# Patient Record
Sex: Female | Born: 1975 | Race: White | Hispanic: No | Marital: Married | Smoking: Never smoker
Health system: Southern US, Community
[De-identification: ages and names within clinical notes are randomized; demographics above are authoritative.]

## PROBLEM LIST (undated history)

## (undated) DIAGNOSIS — IMO0002 Reserved for concepts with insufficient information to code with codable children: Secondary | ICD-10-CM

## (undated) DIAGNOSIS — R000 Tachycardia, unspecified: Secondary | ICD-10-CM

## (undated) DIAGNOSIS — Z8639 Personal history of other endocrine, nutritional and metabolic disease: Secondary | ICD-10-CM

## (undated) DIAGNOSIS — F32A Depression, unspecified: Secondary | ICD-10-CM

## (undated) DIAGNOSIS — Z973 Presence of spectacles and contact lenses: Secondary | ICD-10-CM

## (undated) DIAGNOSIS — F419 Anxiety disorder, unspecified: Secondary | ICD-10-CM

## (undated) DIAGNOSIS — N803 Endometriosis of pelvic peritoneum: Secondary | ICD-10-CM

## (undated) DIAGNOSIS — M1990 Unspecified osteoarthritis, unspecified site: Secondary | ICD-10-CM

## (undated) DIAGNOSIS — T7840XA Allergy, unspecified, initial encounter: Secondary | ICD-10-CM

## (undated) DIAGNOSIS — F329 Major depressive disorder, single episode, unspecified: Secondary | ICD-10-CM

## (undated) DIAGNOSIS — C801 Malignant (primary) neoplasm, unspecified: Secondary | ICD-10-CM

## (undated) DIAGNOSIS — Z9889 Other specified postprocedural states: Secondary | ICD-10-CM

## (undated) DIAGNOSIS — E119 Type 2 diabetes mellitus without complications: Secondary | ICD-10-CM

## (undated) DIAGNOSIS — R112 Nausea with vomiting, unspecified: Secondary | ICD-10-CM

## (undated) DIAGNOSIS — N939 Abnormal uterine and vaginal bleeding, unspecified: Secondary | ICD-10-CM

## (undated) DIAGNOSIS — M797 Fibromyalgia: Secondary | ICD-10-CM

## (undated) DIAGNOSIS — R102 Pelvic and perineal pain: Secondary | ICD-10-CM

## (undated) DIAGNOSIS — G8929 Other chronic pain: Secondary | ICD-10-CM

## (undated) HISTORY — PX: ABDOMINAL HYSTERECTOMY: SHX81

## (undated) HISTORY — DX: Unspecified osteoarthritis, unspecified site: M19.90

## (undated) HISTORY — PX: REDUCTION MAMMAPLASTY: SUR839

## (undated) HISTORY — DX: Allergy, unspecified, initial encounter: T78.40XA

## (undated) HISTORY — DX: Malignant (primary) neoplasm, unspecified: C80.1

## (undated) HISTORY — PX: BREAST REDUCTION SURGERY: SHX8

## (undated) HISTORY — PX: MANDIBLE SURGERY: SHX707

## (undated) HISTORY — DX: Anxiety disorder, unspecified: F41.9

## (undated) HISTORY — PX: APPENDECTOMY: SHX54

## (undated) SURGERY — DILATION AND CURETTAGE
Anesthesia: Choice

---

## 2008-06-12 ENCOUNTER — Other Ambulatory Visit: Admission: RE | Admit: 2008-06-12 | Discharge: 2008-06-12 | Payer: Self-pay | Admitting: Obstetrics and Gynecology

## 2008-11-25 ENCOUNTER — Inpatient Hospital Stay (HOSPITAL_COMMUNITY): Admission: AD | Admit: 2008-11-25 | Discharge: 2008-11-25 | Payer: Self-pay | Admitting: Obstetrics and Gynecology

## 2008-11-26 ENCOUNTER — Ambulatory Visit (HOSPITAL_COMMUNITY): Admission: RE | Admit: 2008-11-26 | Discharge: 2008-11-26 | Payer: Self-pay | Admitting: Obstetrics and Gynecology

## 2008-12-03 ENCOUNTER — Ambulatory Visit (HOSPITAL_COMMUNITY): Admission: RE | Admit: 2008-12-03 | Discharge: 2008-12-03 | Payer: Self-pay | Admitting: Obstetrics and Gynecology

## 2010-04-14 LAB — CBC
HCT: 37.6 % (ref 36.0–46.0)
HCT: 39.4 % (ref 36.0–46.0)
Hemoglobin: 12.6 g/dL (ref 12.0–15.0)
Hemoglobin: 13.2 g/dL (ref 12.0–15.0)
MCHC: 33.4 g/dL (ref 30.0–36.0)
MCHC: 33.6 g/dL (ref 30.0–36.0)
MCV: 77.7 fL — ABNORMAL LOW (ref 78.0–100.0)
MCV: 78.4 fL (ref 78.0–100.0)
Platelets: 220 K/uL (ref 150–400)
Platelets: 224 K/uL (ref 150–400)
RBC: 4.84 MIL/uL (ref 3.87–5.11)
RBC: 5.03 MIL/uL (ref 3.87–5.11)
RDW: 14.7 % (ref 11.5–15.5)
RDW: 14.8 % (ref 11.5–15.5)
WBC: 10.8 K/uL — ABNORMAL HIGH (ref 4.0–10.5)
WBC: 9.4 K/uL (ref 4.0–10.5)

## 2010-04-14 LAB — HCG, QUANTITATIVE, PREGNANCY

## 2010-04-14 LAB — GC/CHLAMYDIA PROBE AMP, GENITAL
Chlamydia, DNA Probe: NEGATIVE
GC Probe Amp, Genital: NEGATIVE

## 2010-04-14 LAB — ABO/RH: ABO/RH(D): A POS

## 2013-06-17 DIAGNOSIS — Z5181 Encounter for therapeutic drug level monitoring: Secondary | ICD-10-CM | POA: Insufficient documentation

## 2013-06-17 DIAGNOSIS — G894 Chronic pain syndrome: Secondary | ICD-10-CM | POA: Insufficient documentation

## 2013-06-17 DIAGNOSIS — M729 Fibroblastic disorder, unspecified: Secondary | ICD-10-CM | POA: Insufficient documentation

## 2013-07-17 DIAGNOSIS — R102 Pelvic and perineal pain: Secondary | ICD-10-CM | POA: Insufficient documentation

## 2013-10-03 ENCOUNTER — Encounter: Payer: Self-pay | Admitting: Emergency Medicine

## 2013-10-03 ENCOUNTER — Emergency Department (INDEPENDENT_AMBULATORY_CARE_PROVIDER_SITE_OTHER): Payer: BC Managed Care – PPO

## 2013-10-03 ENCOUNTER — Emergency Department
Admission: EM | Admit: 2013-10-03 | Discharge: 2013-10-03 | Disposition: A | Discharge status: Home / Self Care | Payer: BC Managed Care – PPO | Source: Home / Self Care | Attending: Emergency Medicine | Admitting: Emergency Medicine

## 2013-10-03 DIAGNOSIS — D169 Benign neoplasm of bone and articular cartilage, unspecified: Secondary | ICD-10-CM

## 2013-10-03 DIAGNOSIS — S8001XA Contusion of right knee, initial encounter: Secondary | ICD-10-CM

## 2013-10-03 MED ORDER — HYDROCODONE-ACETAMINOPHEN 5-325 MG PO TABS: 1.0000 | ORAL_TABLET | ORAL | Status: DC | PRN

## 2013-10-03 NOTE — Discharge Instructions (Signed)
Contusion °A contusion is a deep bruise. Contusions are the result of an injury that caused bleeding under the skin. The contusion may turn blue, purple, or yellow. Minor injuries will give you a painless contusion, but more severe contusions may stay painful and swollen for a few weeks.  °CAUSES  °A contusion is usually caused by a blow, trauma, or direct force to an area of the body. °SYMPTOMS  °· Swelling and redness of the injured area. °· Bruising of the injured area. °· Tenderness and soreness of the injured area. °· Pain. °DIAGNOSIS  °The diagnosis can be made by taking a history and physical exam. An X-ray, CT scan, or MRI may be needed to determine if there were any associated injuries, such as fractures. °TREATMENT  °Specific treatment will depend on what area of the body was injured. In general, the best treatment for a contusion is resting, icing, elevating, and applying cold compresses to the injured area. Over-the-counter medicines may also be recommended for pain control. Ask your caregiver what the best treatment is for your contusion. °HOME CARE INSTRUCTIONS  °· Put ice on the injured area. °¨ Put ice in a plastic bag. °¨ Place a towel between your skin and the bag. °¨ Leave the ice on for 15-20 minutes, 3-4 times a day, or as directed by your health care provider. °· Only take over-the-counter or prescription medicines for pain, discomfort, or fever as directed by your caregiver. Your caregiver may recommend avoiding anti-inflammatory medicines (aspirin, ibuprofen, and naproxen) for 48 hours because these medicines may increase bruising. °· Rest the injured area. °· If possible, elevate the injured area to reduce swelling. °SEEK IMMEDIATE MEDICAL CARE IF:  °· You have increased bruising or swelling. °· You have pain that is getting worse. °· Your swelling or pain is not relieved with medicines. °MAKE SURE YOU:  °· Understand these instructions. °· Will watch your condition. °· Will get help right  away if you are not doing well or get worse. °Document Released: 10/06/2004 Document Revised: 01/01/2013 Document Reviewed: 11/01/2010 °[employer]® Patient Information ©2015 [employer]. This information is not intended to replace advice given to you by your health care provider. Make sure you discuss any questions you have with your health care provider. ° °

## 2013-10-03 NOTE — ED Provider Notes (Signed)
CSN: [id number]     Arrival date & time 10/03/13  1640 History   First MD Initiated Contact with Patient 10/03/13 1702     Chief Complaint  Patient presents with  . Fall   (Consider location/radiation/quality/duration/timing/severity/associated sxs/prior Treatment) Patient is a 38 y.o. female presenting with fall. The history is provided by the patient. No language interpreter was used.  Fall This is a new problem. The problem occurs constantly. The problem has been gradually worsening. Nothing aggravates the symptoms. The symptoms are relieved by walking. She has tried nothing for the symptoms. The treatment provided no relief.    Past Medical History  Diagnosis Date  . Diabetes mellitus without complication    History reviewed. No pertinent past surgical history. Family History  Problem Relation Age of Onset  . Adopted: Yes  . Hyperlipidemia Mother   . Hypertension Mother    History  Substance Use Topics  . Smoking status: Never Smoker   . Smokeless tobacco: Not on file  . Alcohol Use: No   OB History   Grav Para Term Preterm Abortions TAB SAB Ect Mult Living                 Review of Systems  All other systems reviewed and are negative.   Allergies  Review of patient's allergies indicates no known allergies.  Home Medications   Prior to Admission medications   Medication Sig Start Date End Date Taking? Authorizing Provider  gabapentin (NEURONTIN) 300 MG capsule Take 300 mg by mouth 3 (three) times daily.   Yes Historical Provider, MD  ibuprofen (ADVIL,MOTRIN) 200 MG tablet Take 200 mg by mouth every 6 (six) hours as needed.   Yes Historical Provider, MD  metFORMIN (GLUCOPHAGE) 500 MG tablet Take by mouth 2 (two) times daily with a meal.   Yes Historical Provider, MD   BP 162/90  Pulse 110  Temp(Src) 98.1 F (36.7 C) (Oral)  Ht 5\' 6"  (1.676 m)  Wt 295 lb (133.811 kg)  BMI 47.64 kg/m2  SpO2 100%  LMP 10/02/2013 Physical Exam  Nursing note and vitals  reviewed. Constitutional: She is oriented to person, place, and time.  Musculoskeletal: She exhibits tenderness.  Swollen right knee,  Bruised,  No effusion,  nv and ns intact,  No gross instability  Neurological: She is alert and oriented to person, place, and time. She has normal reflexes.  Skin: Skin is warm.  Psychiatric: She has a normal mood and affect.    ED Course  Procedures (including critical care time) Labs Review Labs Reviewed - No data to display  Imaging Review No results found.   MDM   1. Contusion of right knee, initial encounter    Knee imbolizer Hydrocodone Follow up with Dr. Darene Lamer in one week. An After Visit Summary was printed and given to the patient.    Fransico Meadow, PA-C 10/03/13 1750  Medical history/examination/treatment/procedure(s) were performed by non-physician provider and as supervising physician I was immediately available for consultation/collaboration.   Jacqulyn Cane, MD 10/04/13 1116

## 2013-10-03 NOTE — ED Notes (Signed)
Rt knee injury from fall, tripped and all weight landed on right knee on wooden ground

## 2013-10-07 ENCOUNTER — Ambulatory Visit: Payer: BC Managed Care – PPO | Attending: Gynecologic Oncology | Admitting: Gynecologic Oncology

## 2013-10-07 ENCOUNTER — Encounter: Payer: Self-pay | Admitting: Gynecologic Oncology

## 2013-10-07 VITALS — BP 147/92 | HR 117 | Temp 98.5°F | Resp 22 | Ht 66.0 in | Wt 299.1 lb

## 2013-10-07 DIAGNOSIS — F411 Generalized anxiety disorder: Secondary | ICD-10-CM

## 2013-10-07 DIAGNOSIS — E119 Type 2 diabetes mellitus without complications: Secondary | ICD-10-CM | POA: Diagnosis not present

## 2013-10-07 DIAGNOSIS — N920 Excessive and frequent menstruation with regular cycle: Secondary | ICD-10-CM | POA: Diagnosis not present

## 2013-10-07 DIAGNOSIS — F419 Anxiety disorder, unspecified: Secondary | ICD-10-CM

## 2013-10-07 DIAGNOSIS — N803 Endometriosis of pelvic peritoneum, unspecified: Secondary | ICD-10-CM

## 2013-10-07 DIAGNOSIS — R102 Pelvic and perineal pain: Secondary | ICD-10-CM

## 2013-10-07 DIAGNOSIS — M25559 Pain in unspecified hip: Secondary | ICD-10-CM

## 2013-10-07 DIAGNOSIS — E1169 Type 2 diabetes mellitus with other specified complication: Secondary | ICD-10-CM

## 2013-10-07 DIAGNOSIS — Z79899 Other long term (current) drug therapy: Secondary | ICD-10-CM | POA: Insufficient documentation

## 2013-10-07 DIAGNOSIS — M797 Fibromyalgia: Secondary | ICD-10-CM

## 2013-10-07 DIAGNOSIS — IMO0001 Reserved for inherently not codable concepts without codable children: Secondary | ICD-10-CM

## 2013-10-07 DIAGNOSIS — E669 Obesity, unspecified: Secondary | ICD-10-CM

## 2013-10-07 DIAGNOSIS — N949 Unspecified condition associated with female genital organs and menstrual cycle: Secondary | ICD-10-CM | POA: Insufficient documentation

## 2013-10-07 DIAGNOSIS — IMO0002 Reserved for concepts with insufficient information to code with codable children: Secondary | ICD-10-CM | POA: Insufficient documentation

## 2013-10-07 DIAGNOSIS — G8929 Other chronic pain: Secondary | ICD-10-CM | POA: Insufficient documentation

## 2013-10-07 HISTORY — DX: Fibromyalgia: M79.7

## 2013-10-07 HISTORY — DX: Obesity, unspecified: E11.69

## 2013-10-07 HISTORY — DX: Generalized anxiety disorder: F41.1

## 2013-10-07 HISTORY — DX: Type 2 diabetes mellitus with other specified complication: E66.9

## 2013-10-07 MED ORDER — NORGESTIMATE-ETH ESTRADIOL 0.25-35 MG-MCG PO TABS: 1.0000 | ORAL_TABLET | Freq: Every day | ORAL | Status: DC

## 2013-10-07 MED ORDER — OXYCODONE HCL 10 MG PO TABS: 10.0000 mg | ORAL_TABLET | ORAL | Status: DC | PRN

## 2013-10-07 MED ORDER — OXYCODONE HCL ER 10 MG PO T12A: 10.0000 mg | EXTENDED_RELEASE_TABLET | Freq: Two times a day (BID) | ORAL | Status: DC

## 2013-10-07 NOTE — Progress Notes (Signed)
Consult Note: Gyn-Onc  Consult was requested by Dr. Nelda Marseille for the evaluation of Christine Grant 38 y.o. female with chronic pelvic pain and dysfunctional uterine bleeding  CC:  Chief Complaint  Patient presents with  . Heavy periods, Pelvic Pain    Assessment/Plan:  Ms. Christine Grant  is a 38 y.o.  year old who is seen in consultation at the request of Dr Nelda Marseille for pelvic pain after cesarean sections and abnormal uterine bleeding.  1/ Pelvic Pain: I am suspicious that Christine Grant's pain is caused by endometriosis rather than strictly postoperative adhesions given its cyclical nature and its onset 7 years after her last cesarean section. I discussed that postoperative (particularly post-cesarean) adhesive disease rarely causes pain unless it is causing incarceration of a viscera (eg bowel) in which case the pain is associated with symptoms from the incarcerated viscera. It is also extremely rare for pathology from adhesions (other than SBO) to spontaneously develop this remote from surgery. This is clearly not the case for Christine Grant whose pain is cyclical, premenstrual and menstrual (dysmenorrhea) in nature.   I discussed with Christine Grant the etiology of surgical adhesions (that they are formed from interruption of tissue planes and therefore spontaneously reform immediately after subsequent surgeries). I discussed that they are not an anatomic structure in their own right that can be resected, but rather involve careful separation of attached visceral structures, which, once again, reform after completion of the surgery due to release of inflammatory mediators.   I discussed that a more likely contributor to pathologic adhesions in Christine Grant case was endometriosis (typically between the ovaries and GI and GU viscera) rather than cesarean adhesions between the lower uterine segment and anterior abdominal wall, which are typically painless.  If endometriosis is the underlying source of her pain, it would  be prudent and necessary to first attempt medical management of this pain with hormonal modulation and suppression of menses. I offered her continuous OCP's vs progestin releasing IUD vs Lupron suppression trial. I discussed that if these therapies are successful in controlling her symptoms, she could either continue with medical management, or attempt surgery knowing that there would be a higher liklihood of success in surgery resolving her symptoms. If the medical ovarian suppression fails to improve symptoms, then surgery (other than potentially a diagnostic laparoscopy) would not be indicated, as the potential for success would be extremely limited and would not outweigh the substantial risks. She is electing for continous OCP trial first (she does not want another IUD as the last (in 2007) had to be "surgically removed from her myometrium" and she would prefer to avoid injections. I have prescribed Sprintec to take continuously. I would like her to trial this for 3-4 months and we will re-evaluate her control of pelvic pain before embarking upon surgery.  I discussed with Christine Grant that surgery for pelvic pain in the absence of defined pathology on imagine, is elective. I discussed that if endometriosis is suspected as the etiology for her pain (which it is), then oophorectomy (bilateral) is a more important component of the surgery than either lysis of adhesions or hysterectomy, as without oophorectomy, she would continue to have endometriosis and stimulation of this disease, and it would be unlikely that her symptoms would resolve. I discussed that surgery would result in permanent infertility (which she states she is comfortable with). I explained that surgical removal of the ovaries in a premenopausal woman would necessitate add-back therapy with HRT (likely single agent estrogen if she is s/p hysterectomy) in  order to reduce the effects of osteoporosis and earlier development of coronary vascular  disease. I discussed that this (HRT until age of natural menopause) is safe from a cancer standpoint because these doses are lower than what her ovaries make. I discussed risks of HRT and OCP's being of risks of MI, stroke and VTE.   With respect to short term management of her pain, I have prescribed her Oxycodone 10gm q 4-6 hrs prn as she reports this is the only medication that helps her symptoms. I discussed that I will assume prescription of this medication during her medical trial of therapy, and that she should not have this medication prescribed by alternate providers during that time. I discussed that if we move forward with surgery, I will continue to prescribe analgesia only during the immediate postoperative (6 week period) after which time I will no longer prescribe narcotics, and if they are required (other than for a surgical complication), she will need to establish care with a pain clinic or provider who will assume long term management of narcotics. We discussed the addictive and habituating nature of narcotics (particularly short acting narcotics such as oxycodone) and the side effects including altered mental status, constipation, nausea.  2/ Dysfunctional uterine bleeding She is at risk for endometrial hyperplasia and cancer given her underlying morbid obesity, and diabetes. She has regular periods and so it is unlikely that she is annovulatory. Additionally, her endometrial stripe on [place] is only 53mm, which means it is extremely unlikely that her bleeding is due to an underlying hyperplastic or malignant process. Sampling (in office) is not feasible for her given her body habitus and poor cervical access. I am recommending hysterectomy at the time of a surgical procedure given her underlying symptoms.   I discussed that she stands a very high risk for surgery due to her morbid obesity (BMI 48kg/m2) placing her at increased risk for all of the following:  bleeding, infection, damage to  internal organs (such as bladder,ureters, bowels), blood clot, reoperation and rehospitalization. I discussed that if she requires a laparotomy, she has a 30-50% risk for major wound infection or breakdown. I discussed that her DM additionally places her at risk for these complications, and that, once again, given the elective nature of the procedure, she must acknowledge that any consent to surgery includes her acknowledging that she is taking on these increased risks for an elective indication. I discussed that it is most reasonable for her to attempt to reduce her weight and control her blood sugars to reduce the perioperative morbidity risk.  I discussed that surgery, if determined to be an option based on a favorable response to medical suppression, would involve a robotic assisted total hysterectomy, bilateral salpingo-oophorectomy and lysis of adhesions with possible laparotomy. I discussed that if it is not safe to proceed with a minimally invasive approach, then laparotomy would be necessary. I discussed that in cases of endometriosis, it may be necessary to perform GI or GU procedures, and there is a small but possible risk for her requiring a colostomy formation (likely temporary).   She expressed understanding of these risks and plan. Her husband attended the appointment with [place] and witnessed this discussion.   I will see Christine Grant back in the new year after she has completed a trial of continuous OCP's. I spent 80 minutes with Christine Grant in this consultation with >50% spent in direct face to face counseling.  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 She strongly desires definitive surgical management with hysterectomy.   Christine Grant's pain began in the summer of 2015 (approximately 3 months ago). Of note she has a history of anxiety disorder and fibromyalgia.  She reports cyclic, q 28 day periods with severe central low pelvic "like I'm having a baby" pain in the week preceding her menstrual period and during her menstrual period. In the weeks following her period the pain episodes subside in intensity and frequency.  During her severe monthly bouts, she experiences cramping like pain, 10/10, only relieved (somewhat, incompletely) by oxycodone 10mg . She gets no relieve from NSAIDs. She denies dyspareunia. The pain is associated with heavier than usual periods (which have been "heavier" for the past 12 months) and include passage of clots. They last less than 7 days. She denies associated symptoms of anemia.  She also has severe back aches during these episodes. The pain during the exacerbations is continuous. During the intervening weeks it is more sporadic (once or twice a day) and less severe. She continues to take narcotics during these in-between weeks as well as during the severe bouts. She denies diarrhea, rectal bleeding, painful defecation or difficulty voiding urine (or incontinence).  She has been seen several times in Emergency Rooms for her pain bouts. She has undergone a transvaginal US (most recently 09/22/13) which revealed a normal sized (5.6x2.3x3.1cm) uterus with a 44mm endometrial stripe and several hypoechoic foci within the fundus. The right ovary was normal in dimensions without masses, the left ovary was not seen.   She has not trialed any medical/hormonal therapy (only analgesia) for her pain.   She has no history of abnormal pap smears and reports annual gyn care throughout her reproductive life. She used a Mirena IUD for contraception between her 2nd and 3rd child (between 2005-2008) however this needed to be hysteroscopically resected as it became "embedded within her uterus".   She has a history of 3 cesarean sections. The first was an elective primary cesarean section due to anticipated large size fetus (9 lbs) in a diabetic mother.  The subsequent 2 cesarean sections were elective scheduled repeat cesarean sections. During the third cesarean section she was told she had severe adhesive disease which prevented a low transverse incision on the uterus (she required a vertical uterine incision) and her obstetrician could not tie both tubes (only one) due to limited visibility.  She is treated for anxiety with a benzodiazepine (alprazolam) and has never been treated with an SSRI. She is managed for fibromyalgia by her PCP, Dr Shirline Frees, with Gabapentin.    Interval History: She continues to have daily pain as stated above. She is currently experiencing a menstrual period.  Current Meds:  Outpatient Encounter Prescriptions as of 10/07/2013  Medication Sig  . gabapentin (NEURONTIN) 300 MG capsule Take 300 mg by mouth 3 (three) times daily.  Marland Kitchen ibuprofen (ADVIL,MOTRIN) 200 MG tablet Take 200 mg by mouth every 6 (six) hours as needed.  . metFORMIN (GLUCOPHAGE) 500 MG tablet Take by mouth 2 (two) times daily with a meal.  . norgestimate-ethinyl estradiol (SPRINTEC 28) 0.25-35 MG-MCG tablet Take 1 tablet by mouth daily.  . Oxycodone HCl 10 MG TABS Take 1 tablet (10 mg total) by mouth every 4 (four) hours as needed (moderate to severe pain).  . [DISCONTINUED] HYDROcodone-acetaminophen (NORCO/VICODIN) 5-325 MG per tablet Take 1-2 tablets by mouth every 4 (four) hours as  needed for moderate pain or severe pain.  . [DISCONTINUED] OxyCODONE (OXYCONTIN) 10 mg T12A 12 hr tablet Take 1 tablet (10 mg total) by mouth every 12 (twelve) hours.    Allergy: No Known Allergies  Social Hx:   History   Social History  . Marital Status: Married    Spouse Name: N/A    Number of Children: N/A  . Years of Education: N/A   Occupational History  . Not on file.   Social History Main Topics  . Smoking status: Never Smoker   . Smokeless tobacco: Not on file  . Alcohol Use: No  . Drug Use: Not on file  . Sexual Activity: Yes   Other  Topics Concern  . Not on file   Social History Narrative  . No narrative on file    Past Surgical Hx:  Past Surgical History  Procedure Laterality Date  . Cesarean section N/A 2002, 2005, 2008    Past Medical Hx:  Past Medical History  Diagnosis Date  . Diabetes mellitus without complication   . Anxiety   . Fibromyalgia affecting shoulder region     Past Gynecological History:  No abnormal pap smears. Cesarean section x3.  Patient's last menstrual period was 10/02/2013.  Family Hx:  Family History  Problem Relation Age of Onset  . Adopted: Yes  . Hyperlipidemia Mother   . Hypertension Mother     Review of Systems:  Constitutional  Feels well,    ENT Normal appearing ears and nares bilaterally Skin/Breast  No rash, sores, jaundice, itching, dryness Cardiovascular  No chest pain, shortness of breath, or edema  Pulmonary  No cough or wheeze.  Gastro Intestinal  No nausea, vomitting, or diarrhoea. No bright red blood per rectum, no abdominal pain, change in bowel movement, or constipation. + pelvic pain (see HPI) Genito Urinary  No frequency, urgency, dysuria, see HPI Musculo Skeletal  No myalgia, arthralgia, joint swelling or pain  Neurologic  No weakness, numbness, change in gait,  Psychology  No depression, anxiety, insomnia.   Vitals:  Blood pressure 147/92, pulse 117, temperature 98.5 F (36.9 C), temperature source Oral, resp. rate 22, height 5\' 6"  (1.676 m), weight 299 lb 1.6 oz (135.671 kg), last menstrual period 10/02/2013.  Physical Exam: WD in NAD Neck  Supple NROM, without any enlargements.  Lymph Node Survey No cervical supraclavicular or inguinal adenopathy Cardiovascular  Pulse normal rate, regularity and rhythm. S1 and S2 normal.  Lungs  Clear to auscultation bilateraly, without wheezes/crackles/rhonchi. Good air movement.  Skin  No rash/lesions/breakdown  Psychiatry  Alert and oriented to person, place, and time  Abdomen   Normoactive bowel sounds, abdomen soft, non-tender and morbidly obese without evidence of hernia.  Back No CVA tenderness Genito Urinary  Vulva/vagina: Normal external female genitalia.  No lesions. No discharge or bleeding.  Bladder/urethra:  No lesions or masses, well supported bladder  Vagina: normal in appearance  Cervix: Normal appearing, no lesions.  Uterus:  Small, mobile, no parametrial involvement or nodularity.  Adnexa: no palpable masses. Rectal  Good tone, no masses no cul de sac nodularity.  Extremities  No bilateral cyanosis, clubbing or edema.   Donaciano Eva, MD   10/07/2013, 4:23 PM

## 2013-10-07 NOTE — Patient Instructions (Signed)
Plan to follow up in Jan 2016 or sooner if needed.  Please call in Nov or Dec 2015 to schedule your appt.  Please call for any questions or concerns.   Ethinyl Estradiol; Norgestimate tablets What is this medicine? ETHINYL ESTRADIOL; NORGESTIMATE (ETH in il es tra DYE ole; nor JES ti mate) is an oral contraceptive. The products combine two types of female hormones, an estrogen and a progestin. They are used to prevent ovulation and pregnancy. Some products are also used to treat acne in females. This medicine may be used for other purposes; ask your health care provider or pharmacist if you have questions. COMMON BRAND NAME(S): Estarylla, MONO-LINYAH, MonoNessa, Ortho Tri-Cyclen, Ortho Tri-Cyclen Lo, Ortho-Cyclen, Previfem, Sprintec, Tri-Estarylla, TRI-LINYAH, Tri-Lo-Sprintec, Tri-Previfem, Tri-Sprintec, Bertram Millard What should I tell my health care provider before I take this medicine? They need to know if you have or ever had any of these conditions: -abnormal vaginal bleeding -blood vessel disease or blood clots -breast, cervical, endometrial, ovarian, liver, or uterine cancer -diabetes -gallbladder disease -heart disease or recent heart attack -high blood pressure -high cholesterol -kidney disease -liver disease -migraine headaches -stroke -systemic lupus erythematosus (SLE) -tobacco smoker -an unusual or allergic reaction to estrogens, progestins, other medicines, foods, dyes, or preservatives -pregnant or trying to get pregnant -breast-feeding How should I use this medicine? Take this medicine by mouth. To reduce nausea, this medicine may be taken with food. Follow the directions on the prescription label. Take this medicine at the same time each day and in the order directed on the package. Do not take your medicine more often than directed. Contact your pediatrician regarding the use of this medicine in children. Special care may be needed. This medicine has been used in female  children who have started having menstrual periods. A patient package insert for the product will be given with each prescription and refill. Read this sheet carefully each time. The sheet may change frequently. Overdosage: If you think you have taken too much of this medicine contact a poison control center or emergency room at once. NOTE: This medicine is only for you. Do not share this medicine with others. What if I miss a dose? If you miss a dose, refer to the patient information sheet you received with your medicine for direction. If you miss more than one pill, this medicine may not be as effective and you may need to use another form of birth control. What may interact with this medicine? -acetaminophen -antibiotics or medicines for infections, especially rifampin, rifabutin, rifapentine, and griseofulvin, and possibly penicillins or tetracyclines -aprepitant -ascorbic acid (vitamin C) -atorvastatin -barbiturate medicines, such as phenobarbital -bosentan -carbamazepine -caffeine -clofibrate -cyclosporine -dantrolene -doxercalciferol -felbamate -grapefruit juice -hydrocortisone -medicines for anxiety or sleeping problems, such as diazepam or temazepam -medicines for diabetes, including pioglitazone -mineral oil -modafinil -mycophenolate -nefazodone -oxcarbazepine -phenytoin -prednisolone -ritonavir or other medicines for HIV infection or AIDS -rosuvastatin -selegiline -soy isoflavones supplements -St. John's wort -tamoxifen or raloxifene -theophylline -thyroid hormones -topiramate -warfarin This list may not describe all possible interactions. Give your health care provider a list of all the medicines, herbs, non-prescription drugs, or dietary supplements you use. Also tell them if you smoke, drink alcohol, or use illegal drugs. Some items may interact with your medicine. What should I watch for while using this medicine? Visit your doctor or health care  professional for regular checks on your progress. You will need a regular breast and pelvic exam and Pap smear while on this medicine. You should also discuss  the need for regular mammograms with your health care professional, and follow his or her guidelines for these tests. This medicine can make your body retain fluid, making your fingers, hands, or ankles swell. Your blood pressure can go up. Contact your doctor or health care professional if you feel you are retaining fluid. Use an additional method of contraception during the first cycle that you take these tablets. If you have any reason to think you are pregnant, stop taking this medicine right away and contact your doctor or health care professional. If you are taking this medicine for hormone related problems, it may take several cycles of use to see improvement in your condition. Smoking increases the risk of getting a blood clot or having a stroke while you are taking birth control pills, especially if you are more than 37 years old. You are strongly advised not to smoke. This medicine can make you more sensitive to the sun. Keep out of the sun. If you cannot avoid being in the sun, wear protective clothing and use sunscreen. Do not use sun lamps or tanning beds/booths. If you wear contact lenses and notice visual changes, or if the lenses begin to feel uncomfortable, consult your eye care specialist. In some women, tenderness, swelling, or minor bleeding of the gums may occur. Notify your dentist if this happens. Brushing and flossing your teeth regularly may help limit this. See your dentist regularly and inform your dentist of the medicines you are taking. If you are going to have elective surgery, you may need to stop taking this medicine before the surgery. Consult your health care professional for advice. This medicine does not protect you against HIV infection (AIDS) or any other sexually transmitted diseases. What side effects may I  notice from receiving this medicine? Side effects that you should report to your doctor or health care professional as soon as possible: -breast tissue changes or discharge -changes in vaginal bleeding during your period or between your periods -chest pain -coughing up blood -dizziness or fainting spells -headaches or migraines -leg, arm or groin pain -severe or sudden headaches -stomach pain (severe) -sudden shortness of breath -sudden loss of coordination, especially on one side of the body -speech problems -symptoms of vaginal infection like itching, irritation or unusual discharge -tenderness in the upper abdomen -vomiting -weakness or numbness in the arms or legs, especially on one side of the body -yellowing of the eyes or skin Side effects that usually do not require medical attention (report to your doctor or health care professional if they continue or are bothersome): -breakthrough bleeding and spotting that continues beyond the 3 initial cycles of pills -breast tenderness -mood changes, anxiety, depression, frustration, anger, or emotional outbursts -increased sensitivity to sun or ultraviolet light -nausea -skin rash, acne, or brown spots on the skin -weight gain (slight) This list may not describe all possible side effects. Call your doctor for medical advice about side effects. You may report side effects to FDA at 1-800-FDA-1088. Where should I keep my medicine? Keep out of the reach of children. Store at room temperature between 15 and 30 degrees C (59 and 86 degrees F). Throw away any unused medicine after the expiration date. NOTE: This sheet is a summary. It may not cover all possible information. If you have questions about this medicine, talk to your doctor, pharmacist, or health care provider.  2015, [employer] Standard. (2007-12-13 13:40:47)

## 2013-10-10 ENCOUNTER — Ambulatory Visit (INDEPENDENT_AMBULATORY_CARE_PROVIDER_SITE_OTHER): Payer: BC Managed Care – PPO | Admitting: Sports Medicine

## 2013-10-10 ENCOUNTER — Encounter: Payer: Self-pay | Admitting: Sports Medicine

## 2013-10-10 VITALS — BP 161/95 | HR 126 | Ht 66.0 in | Wt 294.0 lb

## 2013-10-10 DIAGNOSIS — S8001XA Contusion of right knee, initial encounter: Secondary | ICD-10-CM | POA: Diagnosis not present

## 2013-10-10 NOTE — Progress Notes (Signed)
   Subjective:    I'm seeing this patient as a consultation for:  Dr. Jacqulyn Cane  CC: Right knee pain  HPI: 4 days ago this pleasant 38 year old female fell directly onto her right knee, she had immediate pain, swelling, bruising. She was seen in urgent care were x-ray showed arthritis but no fractures. She was referred to me for further evaluation and definitive treatment. She is ambulatory but does have some pain over the patellar tendon. Moderate, improving.  Past medical history, Surgical history, Family history not pertinant except as noted below, Social history, Allergies, and medications have been entered into the medical record, reviewed, and no changes needed.   Review of Systems: No headache, visual changes, nausea, vomiting, diarrhea, constipation, dizziness, abdominal pain, skin rash, fevers, chills, night sweats, weight loss, swollen lymph nodes, body aches, joint swelling, muscle aches, chest pain, shortness of breath, mood changes, visual or auditory hallucinations.   Objective:   General: Well Developed, well nourished, and in no acute distress.  Neuro/Psych: Alert and oriented x3, extra-ocular muscles intact, able to move all 4 extremities, sensation grossly intact. Skin: Warm and dry, no rashes noted.  Respiratory: Not using accessory muscles, speaking in full sentences, trachea midline.  Cardiovascular: Pulses palpable, no extremity edema. Abdomen: Does not appear distended. Right Knee: Some bruising with palpable tenderness over the patellar tendon. ROM normal in flexion and extension and lower leg rotation. Ligaments with solid consistent endpoints including ACL, PCL, LCL, MCL. Negative Mcmurray's and provocative meniscal tests. Non painful patellar compression. Patellar and quadriceps tendons unremarkable. Hamstring and quadriceps strength is normal.  Procedure: Diagnostic Ultrasound of  right knee Device: GE Logiq E  Findings: Noted intact patellar tendon with  hypoechoic change in the subcutaneous tissues suggestive of bruising. Images permanently stored and available for review in the ultrasound unit.  Impression: Anterior contusion of the knee  Impression and Recommendations:   This case required medical decision making of moderate complexity.

## 2013-10-10 NOTE — Assessment & Plan Note (Signed)
Occurred 4 days ago. 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 Strap with compressive dressing, continue naproxen 500 mg. Ice 20 minutes 3-4 times a day, return in 2 weeks.

## 2013-10-21 ENCOUNTER — Other Ambulatory Visit: Payer: Self-pay | Admitting: Gynecologic Oncology

## 2013-10-21 DIAGNOSIS — R102 Pelvic and perineal pain: Principal | ICD-10-CM

## 2013-10-21 DIAGNOSIS — G8929 Other chronic pain: Secondary | ICD-10-CM

## 2013-10-21 DIAGNOSIS — IMO0002 Reserved for concepts with insufficient information to code with codable children: Secondary | ICD-10-CM

## 2013-10-21 DIAGNOSIS — N803 Endometriosis of pelvic peritoneum: Secondary | ICD-10-CM

## 2013-10-21 MED ORDER — PROMETHAZINE HCL 25 MG PO TABS: 12.5000 mg | ORAL_TABLET | Freq: Four times a day (QID) | ORAL | Status: DC | PRN

## 2013-10-21 MED ORDER — OXYCODONE HCL 10 MG PO TABS: 10.0000 mg | ORAL_TABLET | ORAL | Status: DC | PRN

## 2013-10-24 ENCOUNTER — Ambulatory Visit: Payer: BC Managed Care – PPO | Admitting: Sports Medicine

## 2013-11-08 ENCOUNTER — Other Ambulatory Visit: Payer: Self-pay | Admitting: Gynecologic Oncology

## 2013-11-08 DIAGNOSIS — IMO0002 Reserved for concepts with insufficient information to code with codable children: Secondary | ICD-10-CM

## 2013-11-08 DIAGNOSIS — R102 Pelvic and perineal pain: Principal | ICD-10-CM

## 2013-11-08 DIAGNOSIS — N803 Endometriosis of pelvic peritoneum: Secondary | ICD-10-CM

## 2013-11-08 DIAGNOSIS — G8929 Other chronic pain: Secondary | ICD-10-CM

## 2013-11-08 MED ORDER — PROMETHAZINE HCL 25 MG PO TABS: 12.5000 mg | ORAL_TABLET | Freq: Four times a day (QID) | ORAL | Status: DC | PRN

## 2013-11-08 MED ORDER — OXYCODONE HCL 10 MG PO TABS: 10.0000 mg | ORAL_TABLET | ORAL | Status: DC | PRN

## 2013-11-08 NOTE — Progress Notes (Signed)
Patient called requesting refill on pain medication and phenergan.  Patient stating "the BCP were helping some and I had been taking then all four weeks of the month.  This month I had a brutal period, the worse pain with a period."  Advised to continue taking BCPs as prescribed.  Medications would be refilled.  She is to call for any questions or concerns.  Reportable signs and symptoms reviewed.  She is to call if the pain persists or worsens.

## 2013-11-14 ENCOUNTER — Telehealth: Payer: Self-pay | Admitting: Gynecologic Oncology

## 2013-11-14 NOTE — Telephone Encounter (Signed)
Patient called stating she has been bleeding vaginally for the past 2 weeks.  At first, she experienced spotting with old, dried blood that turned into more fresh blood.  This is the first month that she has started the prescribed BCPs by Dr. Denman George.  Advised to continue taking the pills.  Reportable signs and symptoms reviewed.  She is to call the office if the bleeding persists or worsens.

## 2013-11-21 ENCOUNTER — Telehealth: Payer: Self-pay | Admitting: Gynecologic Oncology

## 2013-11-21 ENCOUNTER — Other Ambulatory Visit: Payer: Self-pay | Admitting: Gynecologic Oncology

## 2013-11-21 DIAGNOSIS — N803 Endometriosis of pelvic peritoneum: Secondary | ICD-10-CM

## 2013-11-21 DIAGNOSIS — R102 Pelvic and perineal pain: Principal | ICD-10-CM

## 2013-11-21 DIAGNOSIS — G8929 Other chronic pain: Secondary | ICD-10-CM

## 2013-11-21 DIAGNOSIS — IMO0002 Reserved for concepts with insufficient information to code with codable children: Secondary | ICD-10-CM

## 2013-11-21 MED ORDER — PROMETHAZINE HCL 25 MG PO TABS: 12.5000 mg | ORAL_TABLET | Freq: Four times a day (QID) | ORAL | Status: DC | PRN

## 2013-11-21 MED ORDER — OXYCODONE HCL ER 20 MG PO T12A: 20.0000 mg | EXTENDED_RELEASE_TABLET | Freq: Two times a day (BID) | ORAL | Status: DC

## 2013-11-21 MED ORDER — OXYCODONE HCL 5 MG PO TABS: 5.0000 mg | ORAL_TABLET | ORAL | Status: DC | PRN

## 2013-11-21 NOTE — Progress Notes (Unsigned)
Patient called requesting refill on pain medication and stating she is taking oxycodone 10 mg every four hours due to severe abdominal pain during her menstrual cycle and on off days as well.  Situation discussed with Dr. Denman George who recommended initiating oxycontin with oxycodone IR for breakthrough pain.  Situation discussed with the patient.  Agreeable with the plan.  Reportable signs and symptoms reviewed.  Do not take and drive added to the scripts.  Advised to use caution to avoid sedation and only take oxycodone for breakthrough pain.

## 2013-11-21 NOTE — Telephone Encounter (Signed)
Message left for patient about beginning an extended release pain medication such as oxycontin instead of taking such large doses of oxycodone immediate release daily.  Advised to call the office to discuss.

## 2013-11-22 ENCOUNTER — Other Ambulatory Visit: Payer: Self-pay | Admitting: Gynecologic Oncology

## 2013-11-22 DIAGNOSIS — R102 Pelvic and perineal pain: Principal | ICD-10-CM

## 2013-11-22 DIAGNOSIS — G8929 Other chronic pain: Secondary | ICD-10-CM

## 2013-11-22 DIAGNOSIS — N803 Endometriosis of pelvic peritoneum: Secondary | ICD-10-CM

## 2013-11-22 DIAGNOSIS — IMO0002 Reserved for concepts with insufficient information to code with codable children: Secondary | ICD-10-CM

## 2013-11-22 MED ORDER — OXYCODONE HCL 10 MG PO TABS: 10.0000 mg | ORAL_TABLET | ORAL | Status: DC | PRN

## 2013-11-22 NOTE — Progress Notes (Signed)
Insurance denied oxycontin.  Recommending the use of morphine ER or hydromorphone ER before oxycontin could be approved.  Patient stating she has done well not using pain medication for her fibromyalgia and she does not want to be labeled as a pain seeker because she is taking oxycodone IR for her abdominal pain.  She is currently taking oxycodone 10 mg every four hours PRN moderate to severe pain and is stating she has been having to take the medication every four hours due to severe abdominal pain related to her cycle.  She would like to give the BCPs another month to see if the pain improves and would like to hold off on taking an extended release medication that may turn into a long term medication.  She recently filled oxycodone 5 mg tablets today and states the amount will only last her five days since she has been taking 10 mg every four hours.  She is going out of town and would like to pick up her additional prescription on Monday.  She is requesting enough medication to cover her since the dose was previously written for 5 mg.  She is not to fill the medication until November 17.  She is not to drive with this medication-hand written on the script.  She is to call the office with an update when she returns from her vacation.  Reportable signs and symptoms reviewed.

## 2013-12-09 ENCOUNTER — Telehealth: Payer: Self-pay | Admitting: *Deleted

## 2013-12-09 ENCOUNTER — Other Ambulatory Visit: Payer: Self-pay | Admitting: Gynecologic Oncology

## 2013-12-09 DIAGNOSIS — IMO0002 Reserved for concepts with insufficient information to code with codable children: Secondary | ICD-10-CM

## 2013-12-09 DIAGNOSIS — R102 Pelvic and perineal pain: Principal | ICD-10-CM

## 2013-12-09 DIAGNOSIS — G8929 Other chronic pain: Secondary | ICD-10-CM

## 2013-12-09 DIAGNOSIS — N803 Endometriosis of pelvic peritoneum: Secondary | ICD-10-CM

## 2013-12-09 MED ORDER — OXYCODONE HCL 10 MG PO TABS: 10.0000 mg | ORAL_TABLET | ORAL | Status: DC | PRN

## 2013-12-09 NOTE — Progress Notes (Signed)
Patient calling for refill on pain medication.  See telephone note from K. Eloisa Northern, Therapist, sports.

## 2013-12-09 NOTE — Telephone Encounter (Addendum)
Pt called requesting refill on oxycodone 10mg  tablets and needing an appt for Dr. Denman George in January 2016. Called pt back and let her know that prescription will be in the binder for her and gave her appt time for 01/13/14. Pt wrote down appt and agreeable to date and time. Per Joylene John, NP encouraged pt to alternate oxycodone with extra strength tylenol tablets. Pt states she is agreeable to try this. She said she just started her third pack of birth control tablets this past Sunday. She also says she has been bleeding off and on for the last 7 weeks. Sometimes it is just light spotting. Told patient to call us with any questions or concerns prior to her appt in January 2016. Pt agreeable to this.

## 2013-12-18 ENCOUNTER — Other Ambulatory Visit: Payer: Self-pay | Admitting: Gynecologic Oncology

## 2013-12-18 DIAGNOSIS — G8929 Other chronic pain: Secondary | ICD-10-CM

## 2013-12-18 DIAGNOSIS — IMO0002 Reserved for concepts with insufficient information to code with codable children: Secondary | ICD-10-CM

## 2013-12-18 DIAGNOSIS — R102 Pelvic and perineal pain: Principal | ICD-10-CM

## 2013-12-18 DIAGNOSIS — N803 Endometriosis of pelvic peritoneum: Secondary | ICD-10-CM

## 2013-12-18 MED ORDER — OXYCODONE HCL 10 MG PO TABS: 10.0000 mg | ORAL_TABLET | ORAL | Status: DC | PRN

## 2013-12-18 NOTE — Progress Notes (Signed)
Patient called this am stating she is on week 9 of bleeding vaginally.  She is currently on the 2nd week of her third month of BCP.  Pain has not improved with BCPs.  Reporting vaginal bleeding "like a period, then it lightens up for two days then is back.  Fresh blood and occasionally heavy."  She continues to report significant pain and states she ran out of her medication early because she had a stomach bug last week and ended up throwing up several pills on the first day.  Refill given and she is to come in on Friday for further evaluation with Dr. Fermin Schwab.  Advised to call for any questions or concerns.

## 2013-12-20 ENCOUNTER — Encounter: Payer: Self-pay | Admitting: Gynecology

## 2013-12-20 ENCOUNTER — Ambulatory Visit: Payer: BC Managed Care – PPO | Attending: Gynecology | Admitting: Gynecology

## 2013-12-20 VITALS — BP 163/98 | HR 130 | Resp 20 | Ht 66.0 in | Wt 299.1 lb

## 2013-12-20 DIAGNOSIS — Z793 Long term (current) use of hormonal contraceptives: Secondary | ICD-10-CM | POA: Insufficient documentation

## 2013-12-20 DIAGNOSIS — Z79899 Other long term (current) drug therapy: Secondary | ICD-10-CM | POA: Diagnosis not present

## 2013-12-20 DIAGNOSIS — G8929 Other chronic pain: Secondary | ICD-10-CM | POA: Diagnosis not present

## 2013-12-20 DIAGNOSIS — R102 Pelvic and perineal pain: Secondary | ICD-10-CM

## 2013-12-20 DIAGNOSIS — N803 Endometriosis of pelvic peritoneum: Secondary | ICD-10-CM

## 2013-12-20 DIAGNOSIS — F419 Anxiety disorder, unspecified: Secondary | ICD-10-CM | POA: Insufficient documentation

## 2013-12-20 DIAGNOSIS — N939 Abnormal uterine and vaginal bleeding, unspecified: Secondary | ICD-10-CM | POA: Insufficient documentation

## 2013-12-20 DIAGNOSIS — E119 Type 2 diabetes mellitus without complications: Secondary | ICD-10-CM | POA: Diagnosis not present

## 2013-12-20 DIAGNOSIS — M797 Fibromyalgia: Secondary | ICD-10-CM | POA: Diagnosis not present

## 2013-12-20 DIAGNOSIS — IMO0002 Reserved for concepts with insufficient information to code with codable children: Secondary | ICD-10-CM

## 2013-12-20 MED ORDER — OXYCODONE HCL 10 MG PO TABS: 10.0000 mg | ORAL_TABLET | ORAL | Status: DC | PRN

## 2013-12-20 NOTE — Patient Instructions (Signed)
You have an ultrasound scheduled 01/08/14 at 2:15. Please have a full bladder for your ultrasound.

## 2013-12-20 NOTE — Progress Notes (Signed)
Consult Note: Gyn-Onc   Christine Grant 38 y.o. female  Chief Complaint  Patient presents with  . Pelvic Pain    Assessment : Chronic pelvic pain. Abnormal uterine bleeding for last 9 weeks.  Plan: I have recommended obtaining an endometrial biopsy. The patient declines/refuses to have that performed without anesthesia. Alternatively, we will order a transvaginal ultrasound to evaluate the uterus to rule out endometrial pathology. She'll continue taking her oral birth control pills on a continuous basis. She is scheduled to see Dr. Rossi on January 4 and will discuss surgery with Dr. Rossi at that time.  Interval History: Patient returns today because of 9 weeks of abnormal bleeding. At her initial visit with Dr. Rossi she was placed on oral contraceptives using a continuous regimen presuming the patient's chronic pain was centered very to endometriosis. The patient reports that after 3 weeks of taking the birth control pills she began bleeding and has continued to bleed for the last 3 weeks. Sometimes spotting sometimes heavier. Her chronic pain is still present and she is taking continuous oxycodone. She denies cramps. She is adamant that she could not be pregnant.  The patient refuses to have an endometrial biopsy claim it is too painful and too difficult.  HPI: Please see Dr. Emma Rossi's extensive note from 10/07/2013 describing the patient's cyclic pelvic pain and irregular bleeding.  Review of Systems:10 point review of systems is negative except as noted in interval history.   Vitals: Blood pressure 163/98, pulse 130, resp. rate 20, height 5' 6" (1.676 m), weight 299 lb 1.6 oz (135.671 kg).  Physical Exam: General : The patient is a healthy woman in no acute distress.  HEENT: normocephalic, extraoccular movements normal; neck is supple without thyromegally  Lynphnodes: Supraclavicular and inguinal nodes not enlarged  Abdomen: Obese, Soft, non-tender, no ascites, no organomegally,  no masses, no hernias  Pelvic:  EGBUS: Normal female  Vagina: Normal, no lesions  Urethra and Bladder: Normal, non-tender  Cervix: Difficult to visualize. There is blood in the vagina and on the cervix Uterus: Unable to outline given the patient's obesity. Bi-manual examination: Non-tender; no adenxal masses or nodularity  Rectal: normal sphincter tone, no masses, no blood  Lower extremities: No edema or varicosities. Normal range of motion      No Known Allergies  Past Medical History  Diagnosis Date  . Diabetes mellitus without complication   . Anxiety   . Fibromyalgia affecting shoulder region     Past Surgical History  Procedure Laterality Date  . Cesarean section N/A 2002, 2005, 2008    Current Outpatient Prescriptions  Medication Sig Dispense Refill  . ALPRAZolam (XANAX) 0.5 MG tablet Take 0.5 mg by mouth at bedtime as needed for anxiety.    . cyclobenzaprine (FLEXERIL) 10 MG tablet Take 10 mg by mouth 3 (three) times daily as needed for muscle spasms.    . DULoxetine (CYMBALTA) 20 MG capsule Take by mouth.    . gabapentin (NEURONTIN) 300 MG capsule Take 300 mg by mouth 3 (three) times daily.    . metFORMIN (GLUCOPHAGE) 500 MG tablet Take by mouth 2 (two) times daily with a meal.    . norgestimate-ethinyl estradiol (SPRINTEC 28) 0.25-35 MG-MCG tablet Take 1 tablet by mouth daily. 1 Package 11  . Oxycodone HCl 10 MG TABS Take 1 tablet (10 mg total) by mouth every 4 (four) hours as needed (moderate to severe pain). 60 tablet 0  . promethazine (PHENERGAN) 25 MG tablet Take 0.5-1 tablets (12.5-25 mg   total) by mouth every 6 (six) hours as needed for nausea or vomiting. 30 tablet 0   No current facility-administered medications for this visit.    History   Social History  . Marital Status: Married    Spouse Name: N/A    Number of Children: N/A  . Years of Education: N/A   Occupational History  . Not on file.   Social History Main Topics  . Smoking status: Never  Smoker   . Smokeless tobacco: Not on file  . Alcohol Use: No  . Drug Use: Not on file  . Sexual Activity: Yes   Other Topics Concern  . Not on file   Social History Narrative    Family History  Problem Relation Age of Onset  . Adopted: Yes  . Hyperlipidemia Mother   . Hypertension Mother       CLARKE-PEARSON,Dartagnan Beavers L, MD 12/20/2013, 2:38 PM        

## 2013-12-27 ENCOUNTER — Telehealth: Payer: Self-pay | Admitting: Gynecologic Oncology

## 2013-12-27 ENCOUNTER — Telehealth: Payer: Self-pay | Admitting: *Deleted

## 2013-12-27 NOTE — Telephone Encounter (Signed)
Patient called the office with complaints of heavy vaginal bleeding.  Reporting having to change her regular size tampon every hour.  She states the oxycodone is not touching her pain.  She is using a heating pad for pain relief as well.  Reporting the bleeding as pretty heavy at times with clots intermittently.  Situation discussed with Dr. [name].  Reportable signs and symptoms reviewed including when to seek emergency attention.  Advised that her ultrasound would be moved up and she would be contacted with the date and time.  She is advised to continue taking oxycodone and using her heating pad.  No other symptoms reported.  Advised to call for any questions or concerns.

## 2013-12-27 NOTE — Telephone Encounter (Signed)
Notified pt of U/S appointment scheduled for 12/31/13 @ 9:00. Pt was advised to arrive at 8:45. Pt agreed with time and date.

## 2013-12-31 ENCOUNTER — Ambulatory Visit (HOSPITAL_COMMUNITY)
Admission: RE | Admit: 2013-12-31 | Discharge: 2013-12-31 | Disposition: A | Discharge status: Home / Self Care | Payer: BC Managed Care – PPO | Source: Ambulatory Visit | Attending: Gynecology | Admitting: Gynecology

## 2013-12-31 ENCOUNTER — Ambulatory Visit (HOSPITAL_COMMUNITY): Admission: RE | Admit: 2013-12-31 | Payer: BC Managed Care – PPO | Source: Ambulatory Visit

## 2013-12-31 DIAGNOSIS — IMO0002 Reserved for concepts with insufficient information to code with codable children: Secondary | ICD-10-CM

## 2013-12-31 DIAGNOSIS — R938 Abnormal findings on diagnostic imaging of other specified body structures: Secondary | ICD-10-CM | POA: Diagnosis not present

## 2013-12-31 DIAGNOSIS — N939 Abnormal uterine and vaginal bleeding, unspecified: Secondary | ICD-10-CM | POA: Diagnosis present

## 2013-12-31 DIAGNOSIS — R102 Pelvic and perineal pain: Secondary | ICD-10-CM | POA: Insufficient documentation

## 2013-12-31 DIAGNOSIS — N803 Endometriosis of pelvic peritoneum: Secondary | ICD-10-CM

## 2014-01-01 ENCOUNTER — Telehealth: Payer: Self-pay | Admitting: Gynecology

## 2014-01-01 NOTE — Telephone Encounter (Signed)
Patient contacted by telephone and informed about the findings from yesterday's ultrasound. Given her thickened and cystic endometrium, I would recommend she undergo a D&C. She's excepting of this recommendation. In follow-up she is continued to have bleeding but would not be able to have a D&C until early January. We will contact her to schedule.

## 2014-01-02 ENCOUNTER — Other Ambulatory Visit: Payer: Self-pay | Admitting: Gynecologic Oncology

## 2014-01-02 DIAGNOSIS — IMO0002 Reserved for concepts with insufficient information to code with codable children: Secondary | ICD-10-CM

## 2014-01-02 DIAGNOSIS — N803 Endometriosis of pelvic peritoneum: Secondary | ICD-10-CM

## 2014-01-02 DIAGNOSIS — R102 Pelvic and perineal pain: Principal | ICD-10-CM

## 2014-01-02 DIAGNOSIS — G8929 Other chronic pain: Secondary | ICD-10-CM

## 2014-01-02 MED ORDER — OXYCODONE HCL 10 MG PO TABS: 10.0000 mg | ORAL_TABLET | ORAL | Status: DC | PRN

## 2014-01-02 NOTE — Progress Notes (Signed)
Patient called this am requesting refill on pain medication to be filled Dec 29.  She would like to pick it up early because she is going out of town.  Pain slightly improved with BCPs. Reporting vaginal bleeding heavy after her ultrasound that resolved.  She continues to report significant pain.  Refill given per Dr. Fermin Schwab.  Advised to call for any questions or concerns.

## 2014-01-06 NOTE — Progress Notes (Signed)
Please put orders in Epic surgery 01-14-13 pre op 01-09-14 Thanks

## 2014-01-08 ENCOUNTER — Ambulatory Visit (HOSPITAL_COMMUNITY): Payer: BC Managed Care – PPO

## 2014-01-09 ENCOUNTER — Encounter (HOSPITAL_COMMUNITY)
Admission: RE | Admit: 2014-01-09 | Discharge: 2014-01-09 | Disposition: A | Discharge status: Home / Self Care | Payer: BC Managed Care – PPO | Source: Ambulatory Visit | Attending: Gynecologic Oncology | Admitting: Gynecologic Oncology

## 2014-01-09 ENCOUNTER — Encounter (HOSPITAL_COMMUNITY): Payer: Self-pay

## 2014-01-09 DIAGNOSIS — R000 Tachycardia, unspecified: Secondary | ICD-10-CM | POA: Insufficient documentation

## 2014-01-09 DIAGNOSIS — Z01818 Encounter for other preprocedural examination: Secondary | ICD-10-CM | POA: Diagnosis not present

## 2014-01-09 HISTORY — DX: Abnormal uterine and vaginal bleeding, unspecified: N93.9

## 2014-01-09 HISTORY — DX: Fibromyalgia: M79.7

## 2014-01-09 HISTORY — DX: Nausea with vomiting, unspecified: R11.2

## 2014-01-09 HISTORY — DX: Unspecified osteoarthritis, unspecified site: M19.90

## 2014-01-09 HISTORY — DX: Major depressive disorder, single episode, unspecified: F32.9

## 2014-01-09 HISTORY — DX: Depression, unspecified: F32.A

## 2014-01-09 HISTORY — DX: Other specified postprocedural states: Z98.890

## 2014-01-09 LAB — BASIC METABOLIC PANEL
Anion gap: 11 (ref 5–15)
BUN: 9 mg/dL (ref 6–23)
CO2: 26 mmol/L (ref 19–32)
Calcium: 9.3 mg/dL (ref 8.4–10.5)
Chloride: 99 mEq/L (ref 96–112)
Creatinine, Ser: 0.63 mg/dL (ref 0.50–1.10)
GFR calc Af Amer: >90 mL/min (ref >90)
GFR calc non Af Amer: >90 mL/min (ref >90)
Glucose, Bld: 341 mg/dL — ABNORMAL HIGH (ref 70–99)
Potassium: 4.2 mmol/L (ref 3.5–5.1)
Sodium: 136 mmol/L (ref 135–145)

## 2014-01-09 LAB — CBC
HCT: 44.5 % (ref 36.0–46.0)
Hemoglobin: 14.6 g/dL (ref 12.0–15.0)
MCH: 25.3 pg — ABNORMAL LOW (ref 26.0–34.0)
MCHC: 32.8 g/dL (ref 30.0–36.0)
MCV: 77 fL — ABNORMAL LOW (ref 78.0–100.0)
Platelets: 288 10*3/uL (ref 150–400)
RBC: 5.78 MIL/uL — ABNORMAL HIGH (ref 3.87–5.11)
RDW: 13.7 % (ref 11.5–15.5)
WBC: 7.3 10*3/uL (ref 4.0–10.5)

## 2014-01-09 LAB — PREGNANCY, URINE: Preg Test, Ur: NEGATIVE

## 2014-01-09 NOTE — Patient Instructions (Signed)
Christine Grant  01/09/2014   Your procedure is scheduled on: 01/14/14   Report to [facility]  Entrance and follow signs to               [place] at 5:30 AM    Call this number if you have problems the morning of surgery [phone]   Remember:  Do not eat food or drink liquids :After Midnight.     Take these medicines the morning of surgery with A SIP OF WATER: BIRTH CONTROL PILL / CYMBALTA / GABAPENTIN                                You may not have any metal on your body including hair pins and              piercings  Do not wear jewelry, make-up, lotions, powders or perfumes.             Do not wear nail polish.  Do not shave  48 hours prior to surgery.              Men may shave face and neck.   Do not bring valuables to the hospital. [place].  Contacts, dentures or bridgework may not be worn into surgery.  Leave suitcase in the car. After surgery it may be brought to your room.     Patients discharged the day of surgery will not be allowed to drive home.  Name and phone number of your driver:  Special Instructions: N/A              Please read over the following fact sheets you were given: _____________________________________________________________________                                                     [place]  Before surgery, you can play an important role.  Because skin is not sterile, your skin needs to be as free of germs as possible.  You can reduce the number of germs on your skin by washing with CHG (chlorahexidine gluconate) soap before surgery.  CHG is an antiseptic cleaner which kills germs and bonds with the skin to continue killing germs even after washing. Please DO NOT use if you have an allergy to CHG or antibacterial soaps.  If your skin becomes reddened/irritated stop using the CHG and inform your nurse when you arrive at Short Stay. Do  not shave (including legs and underarms) for at least 48 hours prior to the first CHG shower.  You may shave your face. Please follow these instructions carefully:   1.  Shower with CHG Soap the night before surgery and the  morning of Surgery.   2.  If you choose to wash your hair, wash your hair first as usual with your  normal  Shampoo.   3.  After you shampoo, rinse your hair and body thoroughly to remove the  shampoo.  4.  Use CHG as you would any other liquid soap.  You can apply chg directly  to the skin and wash . Gently wash with scrungie or clean wascloth    5.  Apply the CHG Soap to your body ONLY FROM THE NECK DOWN.   Do not use on open                           Wound or open sores. Avoid contact with eyes, ears mouth and genitals (private parts).                        Genitals (private parts) with your normal soap.              6.  Wash thoroughly, paying special attention to the area where your surgery  will be performed.   7.  Thoroughly rinse your body with warm water from the neck down.   8.  DO NOT shower/wash with your normal soap after using and rinsing off  the CHG Soap .                9.  Pat yourself dry with a clean towel.             10.  Wear clean pajamas.             11.  Place clean sheets on your bed the night of your first shower and do not  sleep with pets.  Day of Surgery : Do not apply any lotions/deodorants the morning of surgery.  Please wear clean clothes to the hospital/surgery center.  FAILURE TO FOLLOW THESE INSTRUCTIONS MAY RESULT IN THE CANCELLATION OF YOUR SURGERY    PATIENT SIGNATURE_________________________________  ______________________________________________________________________    [name]  An incentive spirometer is a tool that can help keep your lungs clear and active. This tool measures how well you are filling your lungs with each breath. Taking long deep breaths  may help reverse or decrease the chance of developing breathing (pulmonary) problems (especially infection) following:  A long period of time when you are unable to move or be active. BEFORE THE PROCEDURE   If the spirometer includes an indicator to show your best effort, your nurse or respiratory therapist will set it to a desired goal.  If possible, sit up straight or lean slightly forward. Try not to slouch.  Hold the incentive spirometer in an upright position. INSTRUCTIONS FOR USE  1. Sit on the edge of your bed if possible, or sit up as far as you can in bed or on a chair. 2. Hold the incentive spirometer in an upright position. 3. Breathe out normally. 4. Place the mouthpiece in your mouth and seal your lips tightly around it. 5. Breathe in slowly and as deeply as possible, raising the piston or the ball toward the top of the column. 6. Hold your breath for 3-5 seconds or for as long as possible. Allow the piston or ball to fall to the bottom of the column. 7. Remove the mouthpiece from your mouth and breathe out normally. 8. Rest for a few seconds and repeat Steps 1 through 7 at least 10 times every 1-2 hours when you are awake. Take your time and take a few normal breaths between deep breaths. 9. The spirometer may include an indicator to show your best effort. Use the indicator as a goal to work toward during each  repetition. 10. After each set of 10 deep breaths, practice coughing to be sure your lungs are clear. If you have an incision (the cut made at the time of surgery), support your incision when coughing by placing a pillow or rolled up towels firmly against it. Once you are able to get out of bed, walk around indoors and cough well. You may stop using the incentive spirometer when instructed by your caregiver.  RISKS AND COMPLICATIONS  Take your time so you do not get dizzy or light-headed.  If you are in pain, you may need to take or ask for pain medication before doing  incentive spirometry. It is harder to take a deep breath if you are having pain. AFTER USE  Rest and breathe slowly and easily.  It can be helpful to keep track of a log of your progress. Your caregiver can provide you with a simple table to help with this. If you are using the spirometer at home, follow these instructions: [place] IF:   You are having difficultly using the spirometer.  You have trouble using the spirometer as often as instructed.  Your pain medication is not giving enough relief while using the spirometer.  You develop fever of 100.5 F (38.1 C) or higher. SEEK IMMEDIATE MEDICAL CARE IF:   You cough up bloody sputum that had not been present before.  You develop fever of 102 F (38.9 C) or greater.  You develop worsening pain at or near the incision site. MAKE SURE YOU:   Understand these instructions.  Will watch your condition.  Will get help right away if you are not doing well or get worse. Document Released: 05/09/2006 Document Revised: 03/21/2011 Document Reviewed: 07/10/2006 [facility] Patient Information 2014 [place], [place].   ________________________________________________________________________

## 2014-01-09 NOTE — Progress Notes (Signed)
   01/09/14 1423  OBSTRUCTIVE SLEEP APNEA  Have you ever been diagnosed with sleep apnea through a sleep study? No  Do you snore loudly (loud enough to be heard through closed doors)?  1  Do you often feel tired, fatigued, or sleepy during the daytime? 1  Has anyone observed you stop breathing during your sleep? 0  Do you have, or are you being treated for high blood pressure? 0  BMI more than 35 kg/m2? 1  Age over 37 years old? 0  Neck circumference greater than 40 cm/16 inches? 1  Gender: 0  Obstructive Sleep Apnea Score 4  Score 4 or greater  Results sent to PCP

## 2014-01-09 NOTE — Progress Notes (Signed)
BMP results done 01/09/14 faxed via EPIC to Dr Denman George and Joylene John, NP.

## 2014-01-13 ENCOUNTER — Ambulatory Visit: Payer: BC Managed Care – PPO | Admitting: Gynecologic Oncology

## 2014-01-13 ENCOUNTER — Telehealth: Payer: Self-pay | Admitting: Gynecologic Oncology

## 2014-01-13 NOTE — Telephone Encounter (Signed)
Patient called stating she would be out of her pain medication, oxycodone 10 mg tablets, tomorrow.  She is unsure whether she left some medication in [place] over the holidays or spilt some in her suitcase but she would like another prescription.  She is scheduled for a D&C tomorrow with Dr. Denman George.  Advised that Dr. Denman George would be notified of the situation and a new prescription would be addressed tomorrow.  Advised to call for any questions or concerns.

## 2014-01-14 ENCOUNTER — Ambulatory Visit (HOSPITAL_COMMUNITY): Payer: BLUE CROSS/BLUE SHIELD | Admitting: Anesthesiology

## 2014-01-14 ENCOUNTER — Encounter (HOSPITAL_COMMUNITY): Payer: Self-pay | Admitting: *Deleted

## 2014-01-14 ENCOUNTER — Emergency Department (HOSPITAL_COMMUNITY)
Admission: EM | Admit: 2014-01-14 | Discharge: 2014-01-14 | Disposition: A | Discharge status: Home / Self Care | Payer: BLUE CROSS/BLUE SHIELD | Attending: Emergency Medicine | Admitting: Emergency Medicine

## 2014-01-14 ENCOUNTER — Encounter (HOSPITAL_COMMUNITY): Payer: Self-pay | Admitting: Emergency Medicine

## 2014-01-14 ENCOUNTER — Ambulatory Visit (HOSPITAL_COMMUNITY)
Admission: RE | Admit: 2014-01-14 | Discharge: 2014-01-14 | Disposition: A | Discharge status: Still In Hospital | Payer: BLUE CROSS/BLUE SHIELD | Source: Ambulatory Visit | Attending: Gynecologic Oncology | Admitting: Gynecologic Oncology

## 2014-01-14 ENCOUNTER — Encounter (HOSPITAL_COMMUNITY)
Admission: RE | Disposition: A | Discharge status: Still In Hospital | Payer: Self-pay | Source: Ambulatory Visit | Attending: Gynecologic Oncology

## 2014-01-14 ENCOUNTER — Telehealth: Payer: Self-pay | Admitting: Gynecologic Oncology

## 2014-01-14 DIAGNOSIS — M797 Fibromyalgia: Secondary | ICD-10-CM | POA: Diagnosis not present

## 2014-01-14 DIAGNOSIS — F419 Anxiety disorder, unspecified: Secondary | ICD-10-CM | POA: Diagnosis not present

## 2014-01-14 DIAGNOSIS — G43909 Migraine, unspecified, not intractable, without status migrainosus: Secondary | ICD-10-CM | POA: Diagnosis not present

## 2014-01-14 DIAGNOSIS — Z538 Procedure and treatment not carried out for other reasons: Secondary | ICD-10-CM | POA: Insufficient documentation

## 2014-01-14 DIAGNOSIS — Z8742 Personal history of other diseases of the female genital tract: Secondary | ICD-10-CM | POA: Insufficient documentation

## 2014-01-14 DIAGNOSIS — E119 Type 2 diabetes mellitus without complications: Secondary | ICD-10-CM | POA: Insufficient documentation

## 2014-01-14 DIAGNOSIS — Z793 Long term (current) use of hormonal contraceptives: Secondary | ICD-10-CM | POA: Diagnosis not present

## 2014-01-14 DIAGNOSIS — Z79899 Other long term (current) drug therapy: Secondary | ICD-10-CM | POA: Insufficient documentation

## 2014-01-14 DIAGNOSIS — F329 Major depressive disorder, single episode, unspecified: Secondary | ICD-10-CM | POA: Diagnosis not present

## 2014-01-14 DIAGNOSIS — E1165 Type 2 diabetes mellitus with hyperglycemia: Secondary | ICD-10-CM | POA: Insufficient documentation

## 2014-01-14 DIAGNOSIS — N939 Abnormal uterine and vaginal bleeding, unspecified: Secondary | ICD-10-CM

## 2014-01-14 DIAGNOSIS — M1990 Unspecified osteoarthritis, unspecified site: Secondary | ICD-10-CM | POA: Diagnosis not present

## 2014-01-14 DIAGNOSIS — E872 Acidosis: Secondary | ICD-10-CM | POA: Diagnosis present

## 2014-01-14 DIAGNOSIS — R739 Hyperglycemia, unspecified: Secondary | ICD-10-CM

## 2014-01-14 LAB — CBC WITH DIFFERENTIAL/PLATELET
Basophils Absolute: 0 10*3/uL (ref 0.0–0.1)
Basophils Relative: 1 % (ref 0–1)
Eosinophils Absolute: 0.1 10*3/uL (ref 0.0–0.7)
Eosinophils Relative: 2 % (ref 0–5)
HCT: 45.8 % (ref 36.0–46.0)
Hemoglobin: 15.4 g/dL — ABNORMAL HIGH (ref 12.0–15.0)
Lymphocytes Relative: 36 % (ref 12–46)
Lymphs Abs: 2.3 10*3/uL (ref 0.7–4.0)
MCH: 26 pg (ref 26.0–34.0)
MCHC: 33.6 g/dL (ref 30.0–36.0)
MCV: 77.2 fL — ABNORMAL LOW (ref 78.0–100.0)
Monocytes Absolute: 0.4 10*3/uL (ref 0.1–1.0)
Monocytes Relative: 7 % (ref 3–12)
Neutro Abs: 3.5 10*3/uL (ref 1.7–7.7)
Neutrophils Relative %: 56 % (ref 43–77)
Platelets: 212 10*3/uL (ref 150–400)
RBC: 5.93 MIL/uL — ABNORMAL HIGH (ref 3.87–5.11)
RDW: 13.4 % (ref 11.5–15.5)
WBC: 6.3 10*3/uL (ref 4.0–10.5)

## 2014-01-14 LAB — BASIC METABOLIC PANEL
Anion gap: 10 (ref 5–15)
BUN: 14 mg/dL (ref 6–23)
CO2: 21 mmol/L (ref 19–32)
Calcium: 9.5 mg/dL (ref 8.4–10.5)
Chloride: 105 mEq/L (ref 96–112)
Creatinine, Ser: 0.6 mg/dL (ref 0.50–1.10)
GFR calc Af Amer: >90 mL/min (ref >90)
GFR calc non Af Amer: >90 mL/min (ref >90)
Glucose, Bld: 276 mg/dL — ABNORMAL HIGH (ref 70–99)
Potassium: 4 mmol/L (ref 3.5–5.1)
Sodium: 136 mmol/L (ref 135–145)

## 2014-01-14 LAB — CBG MONITORING, ED
Glucose-Capillary: 255 mg/dL — ABNORMAL HIGH (ref 70–99)
Glucose-Capillary: 237 mg/dL — ABNORMAL HIGH (ref 70–99)

## 2014-01-14 LAB — KETONES, URINE: Ketones, ur: 15 mg/dL — AB

## 2014-01-14 LAB — GLUCOSE, CAPILLARY: Glucose-Capillary: 306 mg/dL — ABNORMAL HIGH (ref 70–99)

## 2014-01-14 SURGERY — DILATION AND CURETTAGE
Anesthesia: General

## 2014-01-14 MED ORDER — METOPROLOL TARTRATE 1 MG/ML IV SOLN
INTRAVENOUS | Status: AC
  Filled 2014-01-14: qty 5

## 2014-01-14 MED ORDER — INSULIN ASPART 100 UNIT/ML ~~LOC~~ SOLN
SUBCUTANEOUS | Status: DC | PRN
  Administered 2014-01-14: 2 [IU] via INTRAVENOUS

## 2014-01-14 MED ORDER — METOPROLOL TARTRATE 1 MG/ML IV SOLN
INTRAVENOUS | Status: DC | PRN
  Administered 2014-01-14 (×2): 1 mg via INTRAVENOUS

## 2014-01-14 MED ORDER — INSULIN ASPART 100 UNIT/ML IV SOLN: 3.0000 [IU] | Freq: Once | INTRAVENOUS | Status: DC

## 2014-01-14 MED ORDER — OXYCODONE HCL 5 MG PO TABS: 10.0000 mg | ORAL_TABLET | ORAL | Status: DC | PRN

## 2014-01-14 MED ORDER — MIDAZOLAM HCL 2 MG/2ML IJ SOLN
INTRAMUSCULAR | Status: AC
  Filled 2014-01-14: qty 2

## 2014-01-14 MED ORDER — INSULIN ASPART 100 UNIT/ML ~~LOC~~ SOLN
SUBCUTANEOUS | Status: AC
  Filled 2014-01-14: qty 1

## 2014-01-14 MED ORDER — INSULIN ASPART 100 UNIT/ML ~~LOC~~ SOLN
2.0000 [IU] | Freq: Once | SUBCUTANEOUS | Status: AC
Start: 2014-01-14 — End: 2014-01-14
  Administered 2014-01-14: 2 [IU] via INTRAVENOUS
  Filled 2014-01-14: qty 1

## 2014-01-14 MED ORDER — LACTATED RINGERS IV SOLN
INTRAVENOUS | Status: DC | PRN
  Administered 2014-01-14: 07:00:00 via INTRAVENOUS

## 2014-01-14 MED ORDER — PROPOFOL 10 MG/ML IV BOLUS
INTRAVENOUS | Status: AC
  Filled 2014-01-14: qty 20

## 2014-01-14 MED ORDER — FENTANYL CITRATE 0.05 MG/ML IJ SOLN
INTRAMUSCULAR | Status: AC
  Filled 2014-01-14: qty 2

## 2014-01-14 MED ORDER — MORPHINE SULFATE 4 MG/ML IJ SOLN
4.0000 mg | Freq: Once | INTRAMUSCULAR | Status: AC
  Administered 2014-01-14: 4 mg via INTRAVENOUS
  Filled 2014-01-14: qty 1

## 2014-01-14 MED ORDER — SODIUM CHLORIDE 0.9 % IV BOLUS (SEPSIS)
1000.0000 mL | Freq: Once | INTRAVENOUS | Status: AC
  Administered 2014-01-14: 1000 mL via INTRAVENOUS

## 2014-01-14 MED ORDER — LIDOCAINE HCL (CARDIAC) 20 MG/ML IV SOLN
INTRAVENOUS | Status: AC
  Filled 2014-01-14: qty 5

## 2014-01-14 MED ORDER — DEXAMETHASONE SODIUM PHOSPHATE 10 MG/ML IJ SOLN
INTRAMUSCULAR | Status: AC
  Filled 2014-01-14: qty 1

## 2014-01-14 MED ORDER — ONDANSETRON HCL 4 MG/2ML IJ SOLN
INTRAMUSCULAR | Status: AC
Start: 2014-01-14 — End: 2014-01-14
  Filled 2014-01-14: qty 2

## 2014-01-14 NOTE — Progress Notes (Signed)
Pt. Seen in or holding area tp prep for planned D &C procedure per  Dr. Denman George.  Heart rate 120's to 130's.  Blood sugar 304mg /dl.  Given Metoprolo and Novolg insulin as ordered per Dr. Danella Maiers. Urine sent for ketones-positive. Surgery cancelled per Dr. Lorenda Hatchet.  Pt. Transferred to ER bed 5 for further blood sugar and heart rate management.  Accepting nurse given  Report.

## 2014-01-14 NOTE — Anesthesia Preprocedure Evaluation (Signed)
Anesthesia Evaluation    History of Anesthesia Complications (+) PONV and history of anesthetic complications  Airway Mallampati: III       Dental   Pulmonary          Cardiovascular     Neuro/Psych  Headaches, PSYCHIATRIC DISORDERS Anxiety Depression  Neuromuscular disease    GI/Hepatic   Endo/Other  diabetes  Renal/GU      Musculoskeletal  (+) Arthritis -, Fibromyalgia -  Abdominal   Peds  Hematology   Anesthesia Other Findings   Reproductive/Obstetrics                             Anesthesia Physical Anesthesia Plan  ASA: III  Anesthesia Plan: General LMA   Post-op Pain Management:    Induction:   Airway Management Planned:   Additional Equipment:   Intra-op Plan:   Post-operative Plan:   Informed Consent:   Plan Discussed with:   Anesthesia Plan Comments:         Anesthesia Quick Evaluation

## 2014-01-14 NOTE — Progress Notes (Signed)
Patient is in ketoacidosis with uncontrolled blood glucose (>300), tachycardia and ketones in the urine. Surgery cancelled for today as it is not safe to proceed. I have notified the ER and patient will be transferred to ER for further workup and control of her ketoacidosis.  I discused with Deazia that it is not safe to proceed with D&C (or any surgical procedure) until her diabetes is controlled. We will plan to discuss with Dr Kenton Kingfisher her PCP, and reschedule when she has established optimized blood glucose. I prescribed 100 tabs of 5mg  oxcodone (to take 10mg  q 4 prn pain). Brienne reports losing some tablets while traveling. I am concerned regarding her high level of narcotic use in the absence of an apparent organic source of pain.  We will attempt to have her seen by a pelvic pain or chronic pain specialist to assist in management of this condition. Unless we identify malignancy on her D&C (when we are able to complete it), she is not a medical candidate for elective hysterectomy due to her morbid obesity, poorly controlled diabetes, and high narcotic usage. 5 days ago her Hb was 14.6, therefore objectively her vaginal bleeding is not severe enough to justify the operative and perioperative risk in this patient for the indication of bleeding.  Donaciano Eva, MD

## 2014-01-14 NOTE — ED Notes (Signed)
Pt brought over from OR due to ketones in her urine and hyperglycemia.  Pt was scheduled to have [facility] for uterine bleeding, pain, and possible cancer, but Dr Denman George felt would be too much strain on pt's kidneys.  Pt recently started on Metformin and had increase in dosage.

## 2014-01-14 NOTE — H&P (View-Only) (Signed)
Consult Note: Gyn-Onc   Christine Grant 39 y.o. female  Chief Complaint  Patient presents with  . Pelvic Pain    Assessment : Chronic pelvic pain. Abnormal uterine bleeding for last 9 weeks.  Plan: I have recommended obtaining an endometrial biopsy. The patient declines/refuses to have that performed without anesthesia. Alternatively, we will order a transvaginal ultrasound to evaluate the uterus to rule out endometrial pathology. She'll continue taking her oral birth control pills on a continuous basis. She is scheduled to see Dr. Denman George on January 4 and will discuss surgery with Dr. Denman George at that time.  Interval History: Patient returns today because of 9 weeks of abnormal bleeding. At her initial visit with Dr. Denman George she was placed on oral contraceptives using a continuous regimen presuming the patient's chronic pain was centered very to endometriosis. The patient reports that after 3 weeks of taking the birth control pills she began bleeding and has continued to bleed for the last 3 weeks. Sometimes spotting sometimes heavier. Her chronic pain is still present and she is taking continuous oxycodone. She denies cramps. She is adamant that she could not be pregnant.  The patient refuses to have an endometrial biopsy claim it is too painful and too difficult.  HPI: Please see Dr. Terrence Dupont Rossi's extensive note from 10/07/2013 describing the patient's cyclic pelvic pain and irregular bleeding.  Review of Systems:10 point review of systems is negative except as noted in interval history.   Vitals: Blood pressure 163/98, pulse 130, resp. rate 20, height 5\' 6"  (1.676 m), weight 299 lb 1.6 oz (135.671 kg).  Physical Exam: General : The patient is a healthy woman in no acute distress.  HEENT: normocephalic, extraoccular movements normal; neck is supple without thyromegally  Lynphnodes: Supraclavicular and inguinal nodes not enlarged  Abdomen: Obese, Soft, non-tender, no ascites, no organomegally,  no masses, no hernias  Pelvic:  EGBUS: Normal female  Vagina: Normal, no lesions  Urethra and Bladder: Normal, non-tender  Cervix: Difficult to visualize. There is blood in the vagina and on the cervix Uterus: Unable to outline given the patient's obesity. Bi-manual examination: Non-tender; no adenxal masses or nodularity  Rectal: normal sphincter tone, no masses, no blood  Lower extremities: No edema or varicosities. Normal range of motion      No Known Allergies  Past Medical History  Diagnosis Date  . Diabetes mellitus without complication   . Anxiety   . Fibromyalgia affecting shoulder region     Past Surgical History  Procedure Laterality Date  . Cesarean section N/A 2002, 2005, 2008    Current Outpatient Prescriptions  Medication Sig Dispense Refill  . ALPRAZolam (XANAX) 0.5 MG tablet Take 0.5 mg by mouth at bedtime as needed for anxiety.    . cyclobenzaprine (FLEXERIL) 10 MG tablet Take 10 mg by mouth 3 (three) times daily as needed for muscle spasms.    . DULoxetine (CYMBALTA) 20 MG capsule Take by mouth.    . gabapentin (NEURONTIN) 300 MG capsule Take 300 mg by mouth 3 (three) times daily.    . metFORMIN (GLUCOPHAGE) 500 MG tablet Take by mouth 2 (two) times daily with a meal.    . norgestimate-ethinyl estradiol (SPRINTEC 28) 0.25-35 MG-MCG tablet Take 1 tablet by mouth daily. 1 Package 11  . Oxycodone HCl 10 MG TABS Take 1 tablet (10 mg total) by mouth every 4 (four) hours as needed (moderate to severe pain). 60 tablet 0  . promethazine (PHENERGAN) 25 MG tablet Take 0.5-1 tablets (12.5-25 mg  total) by mouth every 6 (six) hours as needed for nausea or vomiting. 30 tablet 0   No current facility-administered medications for this visit.    History   Social History  . Marital Status: Married    Spouse Name: N/A    Number of Children: N/A  . Years of Education: N/A   Occupational History  . Not on file.   Social History Main Topics  . Smoking status: Never  Smoker   . Smokeless tobacco: Not on file  . Alcohol Use: No  . Drug Use: Not on file  . Sexual Activity: Yes   Other Topics Concern  . Not on file   Social History Narrative    Family History  Problem Relation Age of Onset  . Adopted: Yes  . Hyperlipidemia Mother   . Hypertension Mother       Alvino Chapel, MD 12/20/2013, 2:38 PM

## 2014-01-14 NOTE — Progress Notes (Signed)
Dr. Delma Post notified of patient's CBG 306

## 2014-01-14 NOTE — Interval H&P Note (Signed)
History and Physical Interval Note:  02/02/14 7:14 AM  Christine Grant  has presented today for surgery, with the diagnosis of pelvic pain/abnormal uterine bleeding and a thickened endometrial stripe on [place]. The various methods of treatment have been discussed with the patient and family. After consideration of risks, benefits and other options for treatment, the patient has consented to  Procedure(s): DILATATION AND CURETTAGE (N/A) as a surgical intervention .  The patient's history has been reviewed, patient examined, no change in status, stable for surgery.  I have reviewed the patient's chart and labs.  Questions were answered to the patient's satisfaction.     Donaciano Eva

## 2014-01-14 NOTE — Telephone Encounter (Signed)
Returned call to patient and left a message.  Advised that she would need pre-operative clearance from her PCP and a letter stating she is cleared for surgery and her blood sugars are stable and under good control before her D&C would be rescheduled.  Advised to call for any questions or concerns.

## 2014-01-14 NOTE — ED Notes (Signed)
CBG of 255 reported to [facility].

## 2014-01-14 NOTE — ED Notes (Signed)
Bed: RP39 Expected date:  Expected time:  Means of arrival:  Comments: OR-hyperglycemia

## 2014-01-14 NOTE — Discharge Instructions (Signed)
Follow-up with your primary Dr. to discuss ways to gain tighter control of your blood sugars.  Return to the emergency department if you develop any new and concerning symptoms.   Hyperglycemia Hyperglycemia occurs when the glucose (sugar) in your blood is too high. Hyperglycemia can happen for many reasons, but it most often happens to people who do not know they have diabetes or are not managing their diabetes properly.  CAUSES  Whether you have diabetes or not, there are other causes of hyperglycemia. Hyperglycemia can occur when you have diabetes, but it can also occur in other situations that you might not be as aware of, such as: Diabetes  If you have diabetes and are having problems controlling your blood glucose, hyperglycemia could occur because of some of the following reasons:  Not following your meal plan.  Not taking your diabetes medications or not taking it properly.  Exercising less or doing less activity than you normally do.  Being sick. Pre-diabetes  This cannot be ignored. Before people develop Type 2 diabetes, they almost always have "pre-diabetes." This is when your blood glucose levels are higher than normal, but not yet high enough to be diagnosed as diabetes. Research has shown that some long-term damage to the body, especially the heart and circulatory system, may already be occurring during pre-diabetes. If you take action to manage your blood glucose when you have pre-diabetes, you may delay or prevent Type 2 diabetes from developing. Stress  If you have diabetes, you may be "diet" controlled or on oral medications or insulin to control your diabetes. However, you may find that your blood glucose is higher than usual in the hospital whether you have diabetes or not. This is often referred to as "stress hyperglycemia." Stress can elevate your blood glucose. This happens because of hormones put out by the body during times of stress. If stress has been the cause of  your high blood glucose, it can be followed regularly by your caregiver. That way he/she can make sure your hyperglycemia does not continue to get worse or progress to diabetes. Steroids  Steroids are medications that act on the infection fighting system (immune system) to block inflammation or infection. One side effect can be a rise in blood glucose. Most people can produce enough extra insulin to allow for this rise, but for those who cannot, steroids make blood glucose levels go even higher. It is not unusual for steroid treatments to "uncover" diabetes that is developing. It is not always possible to determine if the hyperglycemia will go away after the steroids are stopped. A special blood test called an A1c is sometimes done to determine if your blood glucose was elevated before the steroids were started. SYMPTOMS  Thirsty.  Frequent urination.  Dry mouth.  Blurred vision.  Tired or fatigue.  Weakness.  Sleepy.  Tingling in feet or leg. DIAGNOSIS  Diagnosis is made by monitoring blood glucose in one or all of the following ways:  A1c test. This is a chemical found in your blood.  Fingerstick blood glucose monitoring.  Laboratory results. TREATMENT  First, knowing the cause of the hyperglycemia is important before the hyperglycemia can be treated. Treatment may include, but is not be limited to:  Education.  Change or adjustment in medications.  Change or adjustment in meal plan.  Treatment for an illness, infection, etc.  More frequent blood glucose monitoring.  Change in exercise plan.  Decreasing or stopping steroids.  Lifestyle changes. HOME CARE INSTRUCTIONS  Test your blood glucose as directed.  Exercise regularly. Your caregiver will give you instructions about exercise. Pre-diabetes or diabetes which comes on with stress is helped by exercising.  Eat wholesome, balanced meals. Eat often and at regular, fixed times. Your caregiver or nutritionist  will give you a meal plan to guide your sugar intake.  Being at an ideal weight is important. If needed, losing as little as 10 to 15 pounds may help improve blood glucose levels. SEEK MEDICAL CARE IF:   You have questions about medicine, activity, or diet.  You continue to have symptoms (problems such as increased thirst, urination, or weight gain). SEEK IMMEDIATE MEDICAL CARE IF:   You are vomiting or have diarrhea.  Your breath smells fruity.  You are breathing faster or slower.  You are very sleepy or incoherent.  You have numbness, tingling, or pain in your feet or hands.  You have chest pain.  Your symptoms get worse even though you have been following your caregiver's orders.  If you have any other questions or concerns. Document Released: 06/22/2000 Document Revised: 03/21/2011 Document Reviewed: 04/25/2011 [facility] Patient Information 2015 [place], [place]. This information is not intended to replace advice given to you by your health care provider. Make sure you discuss any questions you have with your health care provider.

## 2014-01-14 NOTE — ED Provider Notes (Signed)
CSN: [id number]     Arrival date & time 01/14/14  6812 History   First MD Initiated Contact with Patient 01/14/14 [phone]     Chief Complaint  Patient presents with  . Hyperglycemia     (Consider location/radiation/quality/duration/timing/severity/associated sxs/prior Treatment) HPI Comments: Patient is a 39 year old female with history of obesity and newly diagnosed type 2 diabetes. She was to undergo a D&C this morning for abnormal uterine bleeding. While she was in preop, her urinalysis revealed ketones and her capillary blood gas was over 300. For this reason, they decided not to perform the procedure and sent her to the ER to be evaluated. She denies to me she is having any symptoms and states that she feels well. She does report some thirst and frequent urination, however nothing out of proportion with her normal.  Patient is a 39 y.o. female presenting with hyperglycemia. The history is provided by the patient.  Hyperglycemia Severity:  Moderate Onset quality:  Gradual Timing:  Constant Progression:  Unchanged Chronicity:  New Diabetes status:  Controlled with oral medications   Past Medical History  Diagnosis Date  . Diabetes mellitus without complication   . Anxiety   . Fibromyalgia affecting shoulder region   . Complication of anesthesia   . PONV (postoperative nausea and vomiting)   . Headache     HX  MIGRAINES  . Fibromyalgia   . Arthritis   . Depression   . Endometriosis   . Abnormal uterine bleeding   . Tachycardia     pt states heart rate has always been  fast - no treatment by previous MD's   Past Surgical History  Procedure Laterality Date  . Cesarean section N/A 2002, 2005, 2008  . Mandible fracture surgery    . Breast reduction surgery    . Iud removal     Family History  Problem Relation Age of Onset  . Adopted: Yes  . Hyperlipidemia Mother   . Hypertension Mother    History  Substance Use Topics  . Smoking status: Never Smoker   . Smokeless  tobacco: Not on file  . Alcohol Use: No   OB History    No data available     Review of Systems  All other systems reviewed and are negative.     Allergies  Review of patient's allergies indicates no known allergies.  Home Medications   Prior to Admission medications   Medication Sig Start Date End Date Taking? Authorizing Provider  acetaminophen (TYLENOL) 500 MG tablet Take 500 mg by mouth every 6 (six) hours as needed for mild pain.    Historical Provider, MD  ALPRAZolam Duanne Moron) 0.5 MG tablet Take 0.5 mg by mouth at bedtime as needed for anxiety.    Historical Provider, MD  cyclobenzaprine (FLEXERIL) 10 MG tablet Take 10 mg by mouth at bedtime as needed for muscle spasms.     Historical Provider, MD  DULoxetine (CYMBALTA) 20 MG capsule Take 20 mg by mouth every morning.     Historical Provider, MD  gabapentin (NEURONTIN) 300 MG capsule Take 300 mg by mouth 3 (three) times daily.    Historical Provider, MD  metFORMIN (GLUCOPHAGE) 500 MG tablet Take 500-1,000 mg by mouth 2 (two) times daily with a meal.     Historical Provider, MD  norgestimate-ethinyl estradiol ([place] 28) 0.25-35 MG-MCG tablet Take 1 tablet by mouth daily. 10/07/13   Everitt Amber, MD  oxyCODONE (OXY IR/ROXICODONE) 5 MG immediate release tablet Take 2 tablets (10 mg total) by  mouth every 4 (four) hours as needed for severe pain. 01/14/14   Everitt Amber, MD  Oxycodone HCl 10 MG TABS Take 1 tablet (10 mg total) by mouth every 4 (four) hours as needed (moderate to severe pain). 01/02/14   Dorothyann Gibbs, NP  promethazine (PHENERGAN) 25 MG tablet Take 0.5-1 tablets (12.5-25 mg total) by mouth every 6 (six) hours as needed for nausea or vomiting. 11/21/13   Melissa D Cross, NP   BP 137/77 mmHg  Pulse 113  Temp(Src) 98.7 F (37.1 C) (Oral)  Resp 20  SpO2 99%  LMP 09/25/2013 (Approximate) Physical Exam  Constitutional: She is oriented to person, place, and time. She appears well-developed and well-nourished. No distress.   HENT:  Head: Normocephalic and atraumatic.  Neck: Normal range of motion. Neck supple.  Cardiovascular: Normal rate and regular rhythm.  Exam reveals no gallop and no friction rub.   No murmur heard. Pulmonary/Chest: Effort normal and breath sounds normal. No respiratory distress. She has no wheezes.  Abdominal: Soft. Bowel sounds are normal. She exhibits no distension. There is no tenderness.  Musculoskeletal: Normal range of motion.  Neurological: She is alert and oriented to person, place, and time.  Skin: Skin is warm and dry. She is not diaphoretic.  Nursing note and vitals reviewed.   ED Course  Procedures (including critical care time) Labs Review Labs Reviewed  BASIC METABOLIC PANEL  CBC WITH DIFFERENTIAL    Imaging Review No results found.   EKG Interpretation None      MDM   Final diagnoses:  None    Laboratory studies are not reflective of DKA. She was given hydration and a low-dose of insulin and sugars are improving. She is appropriate for discharge. She is to reschedule her surgery and follow-up with her primary Dr. to discuss tighter control of her diabetes. She was advised of lifestyle modification, weight loss.    Veryl Speak, MD 01/14/14 [phone]

## 2014-01-17 ENCOUNTER — Ambulatory Visit: Payer: BC Managed Care – PPO | Admitting: Gynecologic Oncology

## 2014-01-20 ENCOUNTER — Telehealth: Payer: Self-pay | Admitting: Gynecologic Oncology

## 2014-01-20 ENCOUNTER — Ambulatory Visit: Payer: BC Managed Care – PPO | Admitting: Gynecologic Oncology

## 2014-01-20 ENCOUNTER — Telehealth: Payer: Self-pay | Admitting: *Deleted

## 2014-01-20 DIAGNOSIS — G8929 Other chronic pain: Secondary | ICD-10-CM

## 2014-01-20 DIAGNOSIS — R102 Pelvic and perineal pain: Principal | ICD-10-CM

## 2014-01-20 DIAGNOSIS — N803 Endometriosis of pelvic peritoneum: Secondary | ICD-10-CM

## 2014-01-20 DIAGNOSIS — IMO0002 Reserved for concepts with insufficient information to code with codable children: Secondary | ICD-10-CM

## 2014-01-20 MED ORDER — OXYCODONE HCL 10 MG PO TABS: 10.0000 mg | ORAL_TABLET | ORAL | Status: DC | PRN

## 2014-01-20 NOTE — Telephone Encounter (Signed)
Placed prescription in binder in injection room. Patient notified it is there to pickup at her convenience.

## 2014-01-20 NOTE — Telephone Encounter (Signed)
Patient called with an update on current status.  An additional metformin was added to her medication regimen along with another medication she cannot recall but starts with an "o".  Her A1C was 8.7 and down from previous value per pt.  She has a follow up appt in two weeks and feels like she could have her release to proceed with a D&C at that time.  She was also referred to a pain clinic and her appt is on Jan 25.  She states she is to run out of her medication around Jan 12 or 13 and would like another prescription to last her until her appt.  Situation discussed with Dr. Denman George.  Refill to be given to provide pain relief until her appt.  Advised to call for any questions or concerns.

## 2014-01-23 ENCOUNTER — Encounter (HOSPITAL_COMMUNITY): Payer: Self-pay | Admitting: Gynecologic Oncology

## 2014-01-24 ENCOUNTER — Telehealth: Payer: Self-pay | Admitting: Gynecologic Oncology

## 2014-01-24 NOTE — Telephone Encounter (Signed)
Returned call to patient.  Patient reporting "really bad sharp pain in back and pelvic area that came on suddenly."  She had stopped taking the [facility] on Jan 6 and has stopped bleeding.  Her pain medication is taking the edge off.  Denies fever, chills, nausea, or emesis.  Reporting moderate fatigue "that has been present for awhile."  Concerns discussed with Dr. Denman George.  No need for an in office assessment today.  Reportable signs and symptoms reviewed.  Advised to call with an update on Monday about her pain.

## 2014-02-10 ENCOUNTER — Encounter (HOSPITAL_BASED_OUTPATIENT_CLINIC_OR_DEPARTMENT_OTHER): Payer: Self-pay | Admitting: *Deleted

## 2014-02-10 NOTE — Progress Notes (Addendum)
NPO AFTER MN. ARRIVE AT 0700.  CURRENT LAB RESULTS AND EKG IN CHART AND EPIC. WILL TAKE CYMBALTA AND GABAPENTIN AM DOS W/ SIPS OF WATER.

## 2014-02-13 ENCOUNTER — Ambulatory Visit (HOSPITAL_BASED_OUTPATIENT_CLINIC_OR_DEPARTMENT_OTHER): Payer: BLUE CROSS/BLUE SHIELD | Admitting: Anesthesiology

## 2014-02-13 ENCOUNTER — Encounter (HOSPITAL_BASED_OUTPATIENT_CLINIC_OR_DEPARTMENT_OTHER): Payer: Self-pay | Admitting: *Deleted

## 2014-02-13 ENCOUNTER — Ambulatory Visit (HOSPITAL_BASED_OUTPATIENT_CLINIC_OR_DEPARTMENT_OTHER)
Admission: RE | Admit: 2014-02-13 | Discharge: 2014-02-13 | Disposition: A | Discharge status: Home / Self Care | Payer: BLUE CROSS/BLUE SHIELD | Source: Ambulatory Visit | Attending: Gynecologic Oncology | Admitting: Gynecologic Oncology

## 2014-02-13 ENCOUNTER — Encounter (HOSPITAL_BASED_OUTPATIENT_CLINIC_OR_DEPARTMENT_OTHER)
Admission: RE | Disposition: A | Discharge status: Home / Self Care | Payer: Self-pay | Source: Ambulatory Visit | Attending: Gynecologic Oncology

## 2014-02-13 ENCOUNTER — Telehealth: Payer: Self-pay | Admitting: *Deleted

## 2014-02-13 DIAGNOSIS — N938 Other specified abnormal uterine and vaginal bleeding: Secondary | ICD-10-CM | POA: Diagnosis not present

## 2014-02-13 DIAGNOSIS — N939 Abnormal uterine and vaginal bleeding, unspecified: Secondary | ICD-10-CM

## 2014-02-13 DIAGNOSIS — R102 Pelvic and perineal pain: Secondary | ICD-10-CM | POA: Diagnosis not present

## 2014-02-13 HISTORY — DX: Reserved for concepts with insufficient information to code with codable children: IMO0002

## 2014-02-13 HISTORY — DX: Type 2 diabetes mellitus without complications: E11.9

## 2014-02-13 HISTORY — DX: Presence of spectacles and contact lenses: Z97.3

## 2014-02-13 HISTORY — DX: Pelvic and perineal pain: R10.2

## 2014-02-13 HISTORY — DX: Tachycardia, unspecified: R00.0

## 2014-02-13 HISTORY — DX: Endometriosis of pelvic peritoneum: N80.3

## 2014-02-13 HISTORY — DX: Personal history of other endocrine, nutritional and metabolic disease: Z86.39

## 2014-02-13 HISTORY — PX: DILATION AND CURETTAGE OF UTERUS: SHX78

## 2014-02-13 HISTORY — DX: Other chronic pain: G89.29

## 2014-02-13 LAB — POCT I-STAT, CHEM 8
BUN: 10 mg/dL (ref 6–23)
Calcium, Ion: 1.24 mmol/L — ABNORMAL HIGH (ref 1.12–1.23)
Chloride: 103 mmol/L (ref 96–112)
Creatinine, Ser: 0.5 mg/dL (ref 0.50–1.10)
Glucose, Bld: 197 mg/dL — ABNORMAL HIGH (ref 70–99)
HCT: 43 % (ref 36.0–46.0)
Hemoglobin: 14.6 g/dL (ref 12.0–15.0)
Potassium: 4.1 mmol/L (ref 3.5–5.1)
Sodium: 138 mmol/L (ref 135–145)
TCO2: 20 mmol/L (ref 0–100)

## 2014-02-13 LAB — POCT PREGNANCY, URINE: Preg Test, Ur: NEGATIVE

## 2014-02-13 LAB — GLUCOSE, CAPILLARY: Glucose-Capillary: 218 mg/dL — ABNORMAL HIGH (ref 70–99)

## 2014-02-13 SURGERY — DILATION AND CURETTAGE
Anesthesia: General | Site: Uterus

## 2014-02-13 MED ORDER — ONDANSETRON HCL 4 MG/2ML IJ SOLN
INTRAMUSCULAR | Status: DC | PRN
Start: 2014-02-13 — End: 2014-02-13
  Administered 2014-02-13: 4 mg via INTRAVENOUS

## 2014-02-13 MED ORDER — OXYCODONE HCL 10 MG PO TABS: 10.0000 mg | ORAL_TABLET | ORAL | Status: DC | PRN

## 2014-02-13 MED ORDER — HYDROMORPHONE HCL 1 MG/ML IJ SOLN
0.2500 mg | INTRAMUSCULAR | Status: DC | PRN
  Filled 2014-02-13: qty 1

## 2014-02-13 MED ORDER — SODIUM CHLORIDE 0.9 % IV SOLN: INTRAVENOUS | Status: DC

## 2014-02-13 MED ORDER — ESMOLOL HCL 10 MG/ML IV SOLN
INTRAVENOUS | Status: DC | PRN
  Administered 2014-02-13 (×3): 20 mg via INTRAVENOUS
  Administered 2014-02-13 (×2): 10 mg via INTRAVENOUS

## 2014-02-13 MED ORDER — KETOROLAC TROMETHAMINE 30 MG/ML IJ SOLN
INTRAMUSCULAR | Status: DC | PRN
  Administered 2014-02-13: 30 mg via INTRAVENOUS

## 2014-02-13 MED ORDER — PROPOFOL 10 MG/ML IV BOLUS
INTRAVENOUS | Status: DC | PRN
  Administered 2014-02-13: 200 mg via INTRAVENOUS

## 2014-02-13 MED ORDER — PROMETHAZINE HCL 25 MG/ML IJ SOLN
6.2500 mg | INTRAMUSCULAR | Status: DC | PRN
Start: 2014-02-13 — End: 2014-02-13
  Filled 2014-02-13: qty 1

## 2014-02-13 MED ORDER — MIDAZOLAM HCL 2 MG/2ML IJ SOLN
INTRAMUSCULAR | Status: AC
  Filled 2014-02-13: qty 2

## 2014-02-13 MED ORDER — SCOPOLAMINE 1 MG/3DAYS TD PT72
MEDICATED_PATCH | TRANSDERMAL | Status: AC
  Filled 2014-02-13: qty 1

## 2014-02-13 MED ORDER — OXYCODONE HCL 5 MG PO TABS
5.0000 mg | ORAL_TABLET | Freq: Once | ORAL | Status: AC | PRN
Start: 2014-02-13 — End: 2014-02-13
  Administered 2014-02-13: 5 mg via ORAL
  Filled 2014-02-13: qty 1

## 2014-02-13 MED ORDER — SCOPOLAMINE 1 MG/3DAYS TD PT72
1.0000 | MEDICATED_PATCH | TRANSDERMAL | Status: DC
  Administered 2014-02-13: 1.5 mg via TRANSDERMAL
  Filled 2014-02-13: qty 1

## 2014-02-13 MED ORDER — OXYCODONE HCL 5 MG PO TABS
ORAL_TABLET | ORAL | Status: AC
  Filled 2014-02-13: qty 1

## 2014-02-13 MED ORDER — FENTANYL CITRATE 0.05 MG/ML IJ SOLN
INTRAMUSCULAR | Status: AC
  Filled 2014-02-13: qty 2

## 2014-02-13 MED ORDER — LACTATED RINGERS IV SOLN
INTRAVENOUS | Status: DC
  Administered 2014-02-13 (×2): via INTRAVENOUS
  Filled 2014-02-13: qty 1000

## 2014-02-13 MED ORDER — DEXAMETHASONE SODIUM PHOSPHATE 4 MG/ML IJ SOLN
INTRAMUSCULAR | Status: DC | PRN
  Administered 2014-02-13: 5 mg via INTRAVENOUS

## 2014-02-13 MED ORDER — FENTANYL CITRATE 0.05 MG/ML IJ SOLN
INTRAMUSCULAR | Status: DC | PRN
  Administered 2014-02-13: 25 ug via INTRAVENOUS
  Administered 2014-02-13: 50 ug via INTRAVENOUS
  Administered 2014-02-13: 25 ug
  Administered 2014-02-13: 50 ug via INTRAVENOUS

## 2014-02-13 MED ORDER — FENTANYL CITRATE 0.05 MG/ML IJ SOLN
INTRAMUSCULAR | Status: AC
  Filled 2014-02-13: qty 4

## 2014-02-13 MED ORDER — MIDAZOLAM HCL 5 MG/5ML IJ SOLN
INTRAMUSCULAR | Status: DC | PRN
  Administered 2014-02-13: 2 mg via INTRAVENOUS

## 2014-02-13 MED ORDER — OXYCODONE HCL 5 MG/5ML PO SOLN
5.0000 mg | Freq: Once | ORAL | Status: AC | PRN
  Filled 2014-02-13: qty 5

## 2014-02-13 MED ORDER — SODIUM CHLORIDE 0.9 % IV SOLN
INTRAVENOUS | Status: DC
  Filled 2014-02-13: qty 1000

## 2014-02-13 MED ORDER — 0.9 % SODIUM CHLORIDE (POUR BTL) OPTIME
TOPICAL | Status: DC | PRN
  Administered 2014-02-13: 500 mL

## 2014-02-13 MED ORDER — ACETAMINOPHEN 10 MG/ML IV SOLN
INTRAVENOUS | Status: DC | PRN
  Administered 2014-02-13: 1000 mg via INTRAVENOUS

## 2014-02-13 MED ORDER — LIDOCAINE HCL (CARDIAC) 20 MG/ML IV SOLN
INTRAVENOUS | Status: DC | PRN
  Administered 2014-02-13: 100 mg via INTRAVENOUS

## 2014-02-13 SURGICAL SUPPLY — 20 items
CATH ROBINSON RED A/P 16FR (CATHETERS) ×2 IMPLANT
DRAPE SHEET LG 3/4 BI-LAMINATE (DRAPES) ×2 IMPLANT
DRAPE UNDERBUTTOCKS STRL (DRAPE) ×2 IMPLANT
GLOVE BIO SURGEON STRL SZ 6 (GLOVE) ×4 IMPLANT
GLOVE BIO SURGEON STRL SZ 6.5 (GLOVE) ×2 IMPLANT
GLOVE INDICATOR 6.5 STRL GRN (GLOVE) ×2 IMPLANT
GOWN STRL NON-REIN LRG LVL3 (GOWN DISPOSABLE) IMPLANT
GOWN STRL REUS W/ TWL LRG LVL3 (GOWN DISPOSABLE) ×2 IMPLANT
GOWN STRL REUS W/TWL LRG LVL3 (GOWN DISPOSABLE) ×2
LEGGING LITHOTOMY PAIR STRL (DRAPES) ×2 IMPLANT
NEEDLE SPNL 22GX3.5 QUINCKE BK (NEEDLE) IMPLANT
PACK BASIN DAY SURGERY FS (CUSTOM PROCEDURE TRAY) ×2 IMPLANT
PAD OB MATERNITY 4.3X12.25 (PERSONAL CARE ITEMS) ×2 IMPLANT
PAD PREP 24X48 CUFFED NSTRL (MISCELLANEOUS) ×2 IMPLANT
SUT VIC AB 0 SH 27 (SUTURE) ×2 IMPLANT
SYR CONTROL 10ML LL (SYRINGE) IMPLANT
TOWEL OR 17X24 6PK STRL BLUE (TOWEL DISPOSABLE) ×2 IMPLANT
TOWEL OR NON WOVEN STRL DISP B (DISPOSABLE) IMPLANT
TRAY DSU PREP LF (CUSTOM PROCEDURE TRAY) ×2 IMPLANT
WATER STERILE IRR 500ML POUR (IV SOLUTION) ×2 IMPLANT

## 2014-02-13 NOTE — Transfer of Care (Signed)
Immediate Anesthesia Transfer of Care Note  Patient: Christine Grant  Procedure(s) Performed: Procedure(s) (LRB): DILATATION AND CURETTAGE (N/A)  Patient Location: PACU  Anesthesia Type: General  Level of Consciousness: awake, alert  and oriented  Airway & Oxygen Therapy: Patient Spontanous Breathing and Patient connected to face mask oxygen  Post-op Assessment: Report given to PACU RN and Post -op Vital signs reviewed and stable  Post vital signs: Reviewed and stable  Complications: No apparent anesthesia complications

## 2014-02-13 NOTE — Anesthesia Preprocedure Evaluation (Addendum)
Anesthesia Evaluation  Patient identified by MRN, date of birth, ID band Patient awake    Reviewed: Allergy & Precautions, NPO status , Patient's Chart, lab work & pertinent test results  History of Anesthesia Complications (+) PONV and history of anesthetic complications  Airway Mallampati: I  TM Distance: >3 FB Neck ROM: Full    Dental  (+) Teeth Intact, Dental Advisory Given   Pulmonary    Pulmonary exam normal       Cardiovascular negative cardio ROS  Rhythm:Regular Rate:Tachycardia     Neuro/Psych PSYCHIATRIC DISORDERS Anxiety Depression negative neurological ROS     GI/Hepatic negative GI ROS, Neg liver ROS,   Endo/Other  diabetes  Renal/GU negative Renal ROS     Musculoskeletal  (+) Arthritis -, Fibromyalgia -, narcotic dependent  Abdominal   Peds  Hematology   Anesthesia Other Findings Hearing impaired left ear. Wearing hearing aid in place  Reproductive/Obstetrics                           Anesthesia Physical Anesthesia Plan  ASA: III  Anesthesia Plan: General   Post-op Pain Management:    Induction: Intravenous  Airway Management Planned: LMA  Additional Equipment:   Intra-op Plan:   Post-operative Plan: Extubation in OR  Informed Consent: I have reviewed the patients History and Physical, chart, labs and discussed the procedure including the risks, benefits and alternatives for the proposed anesthesia with the patient or authorized representative who has indicated his/her understanding and acceptance.   Dental advisory given  Plan Discussed with:   Anesthesia Plan Comments: (Esmolol during induction.)       Anesthesia Quick Evaluation

## 2014-02-13 NOTE — Anesthesia Procedure Notes (Signed)
Procedure Name: LMA Insertion Date/Time: 02/13/2014 8:41 AM Performed by: Mechele Claude Pre-anesthesia Checklist: Patient identified, Emergency Drugs available, Suction available and Patient being monitored Patient Re-evaluated:Patient Re-evaluated prior to inductionOxygen Delivery Method: Circle System Utilized Preoxygenation: Pre-oxygenation with 100% oxygen Intubation Type: IV induction Ventilation: Mask ventilation without difficulty LMA: LMA with gastric port inserted LMA Size: 4.0 Number of attempts: 1 Placement Confirmation: positive ETCO2 Tube secured with: Tape Dental Injury: Teeth and Oropharynx as per pre-operative assessment

## 2014-02-13 NOTE — Anesthesia Postprocedure Evaluation (Signed)
Anesthesia Post Note  Patient: Christine Grant  Procedure(s) Performed: Procedure(s) (LRB): DILATATION AND CURETTAGE (N/A)  Anesthesia type: general  Patient location: PACU  Post pain: Pain level controlled  Post assessment: Patient's Cardiovascular Status Stable  Last Vitals:  Filed Vitals:   02/13/14 1000  BP: 120/72  Pulse: 111  Temp:   Resp: 20    Post vital signs: Reviewed and stable  Level of consciousness: sedated  Complications: No apparent anesthesia complications

## 2014-02-13 NOTE — Telephone Encounter (Signed)
Received call from Ulice Dash, a pharmacist at [facility] on [address], checking to see if it is okay to fill oxycodone prescription written by Dr. Denman George today. He states that patient filled a prescription for 10mg  oxycodone tablets #60 written by Dr. Dian Situ on 02/03/14 and that the script states patient must wait 30 days before filling another oxycodone script.   Discussed with Dr. Denman George - she states script written today cannot be filled until 03/06/14. Ulice Dash states he will write that on the prescription and return to patient.

## 2014-02-13 NOTE — H&P (Signed)
Christine Grant 39 y.o. female  Chief Complaint  Patient presents with  . Pelvic Pain  Abnormal uterine bleeding, thickened endometrial stripe on [place].  Assessment : Chronic pelvic pain. Abnormal uterine bleeding for last 9 weeks.  Plan: D&C. The patient declines/refuses to have sampling performed without anesthesia.   Interval History: Patient returns today because of 9 weeks of abnormal bleeding. At her initial visit with Dr. Denman George she was placed on oral contraceptives using a continuous regimen presuming the patient's chronic pain was centered very to endometriosis. The patient reports that after 3 weeks of taking the birth control pills she began bleeding and has continued to bleed for the last 3 weeks. Sometimes spotting sometimes heavier. Her chronic pain is still present and she is taking continuous oxycodone. She denies cramps. She is adamant that she could not be pregnant.  The patient refuses to have an endometrial biopsy claim it is too painful and too difficult.  She presented for a D&C at [facility] in January 2016, however her case was cancelled due to uncontrolled DM and diabetic ketoacidosis. She has returned to see her physician for optimization in the interim.  HPI: Please see my extensive note from 10/07/2013 describing the patient's cyclic pelvic pain and irregular bleeding.  Review of Systems:10 point review of systems is negative except as noted in interval history.   Vitals: Blood pressure 163/98, pulse 130, resp. rate 20, height 5\' 6"  (1.676 m), weight 299 lb 1.6 oz (135.671 kg).  Physical Exam: General : The patient is a healthy woman in no acute distress.  HEENT: normocephalic, extraoccular movements normal; neck is supple without thyromegally  Lynphnodes: Supraclavicular and inguinal nodes not enlarged  Abdomen: Obese, Soft, non-tender, no ascites, no organomegally, no masses, no hernias  Pelvic:  EGBUS: Normal female  Vagina: Normal, no lesions  Urethra  and Bladder: Normal, non-tender  Cervix: Difficult to visualize. There is blood in the vagina and on the cervix Uterus: Unable to outline given the patient's obesity. Bi-manual examination: Non-tender; no adenxal masses or nodularity  Rectal: normal sphincter tone, no masses, no blood  Lower extremities: No edema or varicosities. Normal range of motion      No Known Allergies  Past Medical History  Diagnosis Date  . Diabetes mellitus without complication   . Anxiety   . Fibromyalgia affecting shoulder region     Past Surgical History  Procedure Laterality Date  . Cesarean section N/A 2002, 2005, 2008    Current Outpatient Prescriptions  Medication Sig Dispense Refill  . ALPRAZolam (XANAX) 0.5 MG tablet Take 0.5 mg by mouth at bedtime as needed for anxiety.    . cyclobenzaprine (FLEXERIL) 10 MG tablet Take 10 mg by mouth 3 (three) times daily as needed for muscle spasms.    . DULoxetine (CYMBALTA) 20 MG capsule Take by mouth.    . gabapentin (NEURONTIN) 300 MG capsule Take 300 mg by mouth 3 (three) times daily.    . metFORMIN (GLUCOPHAGE) 500 MG tablet Take by mouth 2 (two) times daily with a meal.    . norgestimate-ethinyl estradiol ([place] 28) 0.25-35 MG-MCG tablet Take 1 tablet by mouth daily. 1 Package 11  . Oxycodone HCl 10 MG TABS Take 1 tablet (10 mg total) by mouth every 4 (four) hours as needed (moderate to severe pain). 60 tablet 0  . promethazine (PHENERGAN) 25 MG tablet Take 0.5-1 tablets (12.5-25 mg total) by mouth every 6 (six) hours as needed for nausea or vomiting. 30 tablet 0  No current facility-administered medications for this visit.    History   Social History  . Marital Status: Married    Spouse Name: N/A    Number of Children: N/A  . Years of Education: N/A   Occupational History  . Not on file.   Social History Main Topics  . Smoking  status: Never Smoker   . Smokeless tobacco: Not on file  . Alcohol Use: No  . Drug Use: Not on file  . Sexual Activity: Yes   Other Topics Concern  . Not on file   Social History Narrative    Family History  Problem Relation Age of Onset  . Adopted: Yes  . Hyperlipidemia Mother   . Hypertension Mother    Donaciano Eva, MD

## 2014-02-13 NOTE — Telephone Encounter (Signed)
Patient called asking for refill on oxycodone tablets. States that she is already out of the pills prescribed by Dr. Vira Blanco on 02/03/14 as she took them every 4 hours instead of twice a day as prescribed. She states "taking them every 4 hours barely touched the pain so I couldn't have made it only taking them twice a day." Told patient that Dr. Denman George cannot refill this prescription. Encouraged patient to try alternating ibuprofen and tylenol as well as using a heating pad.

## 2014-02-13 NOTE — Op Note (Signed)
PATIENT: Christine Grant ENCOUNTER DATE: 03/02/14   Preop Diagnosis: thickened endometrial stripe, abnormal uterine bleeding, pelvic pain  Postoperative Diagnosis: same  Surgery: dilation and currettage  Surgeons:  Donaciano Eva, MD  Assistant: none  Anesthesia: General   Estimated blood loss: <14ml  IVF:  500 ml   Urine output: 100 ml (straight cath)  Complications: None   Pathology: endometrial currettings   Operative findings: Uterus sounded to 9cm, steeply anteverted uterus. Moderate volume of tissues.  Procedure: The patient was identified in the preoperative holding area. Informed consent was signed on the chart. Patient was seen history was reviewed and exam was performed.   The patient was then taken to the operating room and placed in the supine position. General anesthesia was then induced without difficulty. She was then placed in the dorsolithotomy position. The perineum was prepped with Betadine. The vagina was prepped with Betadine. The patient was then draped after the prep was dried. A foley catheter to empty the bladder was placed under sterile conditions.  Timeout was performed the patient, procedure, antibiotic, allergy, and length of procedure.   The cervix was grasped with a single tooth tenaculum. The uterus was sounded to 9cm. Hagar's dilators were used to dilate the cervix to accommodate the currette. The currette was placed in the cavity and gently advanced to the fundus. Comprehensive currettage took place of all endometrial surface. The specimen was collected on a telfar sponge and sent for pathology. The tenaculum was removed and puncture sites were noted to be hemostatic.  All instrument, suture, laparotomy, Ray-Tec, and needle counts were correct x2. The patient tolerated the procedure well and was taken recovery room in stable condition. This is Christine Grant dictating an operative note on Christine Grant.

## 2014-02-13 NOTE — Discharge Instructions (Signed)
°  Post Anesthesia Home Care Instructions  Activity: Get plenty of rest for the remainder of the day. A responsible adult should stay with you for 24 hours following the procedure.  For the next 24 hours, DO NOT: -Drive a car -Paediatric nurse -Drink alcoholic beverages -Take any medication unless instructed by your physician -Make any legal decisions or sign important papers.  Meals: Start with liquid foods such as gelatin or soup. Progress to regular foods as tolerated. Avoid greasy, spicy, heavy foods. If nausea and/or vomiting occur, drink only clear liquids until the nausea and/or vomiting subsides. Call your physician if vomiting continues.  Special Instructions/Symptoms: Your throat may feel dry or sore from the anesthesia or the breathing tube placed in your throat during surgery. If this causes discomfort, gargle with warm salt water. The discomfort should disappear within 24 hours.   D & C Home care Instructions:   Personal hygiene:  Used sanitary napkins for vaginal drainage not tampons. Shower or tub bathe the day after your procedure. No douching until bleeding stops. Always wipe from front to back after  Elimination.  Activity: Do not drive or operate any equipment today. The effects of the anesthesia are still present and drowsiness may result. Rest today, not necessarily flat bed rest, just take it easy. You may resume your normal activity in one to 2 days.  Sexual activity: No intercourse for one week or as indicated by your physician  Diet: Eat a light diet as desired this evening. You may resume a regular diet tomorrow.  Return to work: One to 2 days.  General Expectations of your surgery: Vaginal bleeding should be no heavier than a normal period. Spotting may continue up to 10 days. Mild cramps may continue for a couple of days. You may have a regular period in 2-6 weeks.  Unexpected observations call your doctor if these occur: persistent or heavy bleeding.  Severe abdominal cramping or pain. Elevation of temperature greater than 100F.  Call for an appointment in one week.    Patient's Signature_______________________________________________________  Nurse's Signature________________________________________________________

## 2014-02-14 ENCOUNTER — Encounter (HOSPITAL_BASED_OUTPATIENT_CLINIC_OR_DEPARTMENT_OTHER): Payer: Self-pay | Admitting: Gynecologic Oncology

## 2014-02-17 ENCOUNTER — Telehealth: Payer: Self-pay | Admitting: Gynecologic Oncology

## 2014-02-17 NOTE — Telephone Encounter (Signed)
Patient calling about D&C results and requesting pain medication. Informed her endometrial lining is benign. No evidence of cancer or precancer. No therapy required. Hysterectomy not indicated.  Recommending referral to the pelvic pain clinic at [facility] per Dr. Denman George.  Again, advised that she would not be receiving a prescription for pain medication because she was seeing a physician at a pain clinic and took her medication every four hours instead of every 12 per that doctor so she ran out early.

## 2014-02-21 ENCOUNTER — Encounter (HOSPITAL_COMMUNITY): Payer: Self-pay | Admitting: *Deleted

## 2014-02-21 ENCOUNTER — Inpatient Hospital Stay (HOSPITAL_COMMUNITY): Payer: BLUE CROSS/BLUE SHIELD

## 2014-02-21 ENCOUNTER — Inpatient Hospital Stay (HOSPITAL_COMMUNITY)
Admission: AD | Admit: 2014-02-21 | Discharge: 2014-02-21 | Disposition: A | Discharge status: Home / Self Care | Payer: BLUE CROSS/BLUE SHIELD | Source: Ambulatory Visit | Attending: Obstetrics & Gynecology | Admitting: Obstetrics & Gynecology

## 2014-02-21 DIAGNOSIS — G8929 Other chronic pain: Secondary | ICD-10-CM

## 2014-02-21 DIAGNOSIS — R102 Pelvic and perineal pain: Secondary | ICD-10-CM | POA: Diagnosis not present

## 2014-02-21 DIAGNOSIS — IMO0002 Reserved for concepts with insufficient information to code with codable children: Secondary | ICD-10-CM

## 2014-02-21 DIAGNOSIS — N803 Endometriosis of pelvic peritoneum: Secondary | ICD-10-CM

## 2014-02-21 DIAGNOSIS — N949 Unspecified condition associated with female genital organs and menstrual cycle: Secondary | ICD-10-CM | POA: Insufficient documentation

## 2014-02-21 LAB — URINALYSIS, ROUTINE W REFLEX MICROSCOPIC
Bilirubin Urine: NEGATIVE
Glucose, UA: NEGATIVE mg/dL
Ketones, ur: NEGATIVE mg/dL
Leukocytes, UA: NEGATIVE
Nitrite: NEGATIVE
Protein, ur: NEGATIVE mg/dL
Specific Gravity, Urine: 1.015 (ref 1.005–1.030)
Urobilinogen, UA: 0.2 mg/dL (ref 0.0–1.0)
pH: 5.5 (ref 5.0–8.0)

## 2014-02-21 LAB — URINE MICROSCOPIC-ADD ON

## 2014-02-21 LAB — WET PREP, GENITAL
Clue Cells Wet Prep HPF POC: NONE SEEN
Trich, Wet Prep: NONE SEEN
Yeast Wet Prep HPF POC: NONE SEEN

## 2014-02-21 LAB — POCT PREGNANCY, URINE: Preg Test, Ur: NEGATIVE

## 2014-02-21 MED ORDER — OXYCODONE HCL 10 MG PO TABS: 10.0000 mg | ORAL_TABLET | Freq: Four times a day (QID) | ORAL | Status: DC | PRN

## 2014-02-21 MED ORDER — HYDROMORPHONE HCL 2 MG/ML IJ SOLN
2.0000 mg | Freq: Once | INTRAMUSCULAR | Status: AC
  Administered 2014-02-21: 2 mg via INTRAMUSCULAR
  Filled 2014-02-21: qty 1

## 2014-02-21 NOTE — MAU Note (Signed)
Has really severe pelvic pain. Has had a couple procedures, seen a couple of MD's.  Trying to get in to specialist at [facility]: Lambert Rhonda W.

## 2014-02-21 NOTE — Discharge Instructions (Signed)
Pelvic Pain Female pelvic pain can be caused by many different things and start from a variety of places. Pelvic pain refers to pain that is located in the lower half of the abdomen and between your hips. The pain may occur over a short period of time (acute) or may be reoccurring (chronic). The cause of pelvic pain may be related to disorders affecting the female reproductive organs (gynecologic), but it may also be related to the bladder, kidney stones, an intestinal complication, or muscle or skeletal problems. Getting help right away for pelvic pain is important, especially if there has been severe, sharp, or a sudden onset of unusual pain. It is also important to get help right away because some types of pelvic pain can be life threatening.  CAUSES  Below are only some of the causes of pelvic pain. The causes of pelvic pain can be in one of several categories.   Gynecologic.  Pelvic inflammatory disease.  Sexually transmitted infection.  Ovarian cyst or a twisted ovarian ligament (ovarian torsion).  Uterine lining that grows outside the uterus (endometriosis).  Fibroids, cysts, or tumors.  Ovulation.  Pregnancy.  Pregnancy that occurs outside the uterus (ectopic pregnancy).  Miscarriage.  Labor.  Abruption of the placenta or ruptured uterus.  Infection.  Uterine infection (endometritis).  Bladder infection.  Diverticulitis.  Miscarriage related to a uterine infection (septic abortion).  Bladder.  Inflammation of the bladder (cystitis).  Kidney stone(s).  Gastrointestinal.  Constipation.  Diverticulitis.  Neurologic.  Trauma.  Feeling pelvic pain because of mental or emotional causes (psychosomatic).  Cancers of the bowel or pelvis. EVALUATION  Your caregiver will want to take a careful history of your concerns. This includes recent changes in your health, a careful gynecologic history of your periods (menses), and a sexual history. Obtaining your family  history and medical history is also important. Your caregiver may suggest a pelvic exam. A pelvic exam will help identify the location and severity of the pain. It also helps in the evaluation of which organ system may be involved. In order to identify the cause of the pelvic pain and be properly treated, your caregiver may order tests. These tests may include:   A pregnancy test.  Pelvic ultrasonography.  An X-ray exam of the abdomen.  A urinalysis or evaluation of vaginal discharge.  Blood tests. HOME CARE INSTRUCTIONS   Only take over-the-counter or prescription medicines for pain, discomfort, or fever as directed by your caregiver.   Rest as directed by your caregiver.   Eat a balanced diet.   Drink enough fluids to make your urine clear or pale yellow, or as directed.   Avoid sexual intercourse if it causes pain.   Apply warm or cold compresses to the lower abdomen depending on which one helps the pain.   Avoid stressful situations.   Keep a journal of your pelvic pain. Write down when it started, where the pain is located, and if there are things that seem to be associated with the pain, such as food or your menstrual cycle.  Follow up with your caregiver as directed.  SEEK MEDICAL CARE IF:  Your medicine does not help your pain.  You have abnormal vaginal discharge. SEEK IMMEDIATE MEDICAL CARE IF:   You have heavy bleeding from the vagina.   Your pelvic pain increases.   You feel light-headed or faint.   You have chills.   You have pain with urination or blood in your urine.   You have uncontrolled diarrhea  or vomiting.   You have a fever or persistent symptoms for more than 3 days.  You have a fever and your symptoms suddenly get worse.   You are being physically or sexually abused.  MAKE SURE YOU:  Understand these instructions.  Will watch your condition.  Will get help if you are not doing well or get worse. Document Released:  11/24/2003 Document Revised: 05/13/2013 Document Reviewed: 04/18/2011 [facility] Patient Information 2015 [place], [place]. This information is not intended to replace advice given to you by your health care provider. Make sure you discuss any questions you have with your health care provider. Chronic Back Pain  When back pain lasts longer than 3 months, it is called chronic back pain.People with chronic back pain often go through certain periods that are more intense (flare-ups).  CAUSES Chronic back pain can be caused by wear and tear (degeneration) on different structures in your back. These structures include:  The bones of your spine (vertebrae) and the joints surrounding your spinal cord and nerve roots (facets).  The strong, fibrous tissues that connect your vertebrae (ligaments). Degeneration of these structures may result in pressure on your nerves. This can lead to constant pain. HOME CARE INSTRUCTIONS  Avoid bending, heavy lifting, prolonged sitting, and activities which make the problem worse.  Take brief periods of rest throughout the day to reduce your pain. Lying down or standing usually is better than sitting while you are resting.  Take over-the-counter or prescription medicines only as directed by your caregiver. SEEK IMMEDIATE MEDICAL CARE IF:   You have weakness or numbness in one of your legs or feet.  You have trouble controlling your bladder or bowels.  You have nausea, vomiting, abdominal pain, shortness of breath, or fainting. Document Released: 02/04/2004 Document Revised: 03/21/2011 Document Reviewed: 12/11/2010 [facility] Patient Information 2015 [place], [place]. This information is not intended to replace advice given to you by your health care provider. Make sure you discuss any questions you have with your health care provider.

## 2014-02-21 NOTE — MAU Note (Signed)
Had a D&C on 02/04. Some bleeding after that, none since.

## 2014-02-21 NOTE — MAU Provider Note (Signed)
Chief Complaint: No chief complaint on file.   None    SUBJECTIVE HPI: Christine Grant is a 39 y.o. [id number] who presents to maternity admissions reporting severe pelvic pain.  She reports she started having pelvic pain with acute onset in June 2015, and saw Dr Nelda Marseille who referred her to [place] for further evaluation/surgery.  She had D&C 02/13/14 by Dr Denman George.  She is referred to pelvic pain clinic at [facility] but is on waiting list currently. She reports calling every day to see if there are cancellations but estimate is 45 days before she will get into the clinic.  She reports she has pelvic adhesions following her 3 cesarean sections and has possible endometriosis. She tried OCPs to manage endometriosis but had continuous bleeding with them with no improvement in pain.  She has never had laproscopic procedure to evaluate endometriosis/pelvic pain but desires this to determine cause of her pain.  She reports she understands that more surgery may not be the answer, but she is not sure what to do next.  She denies vaginal itching/burning, urinary symptoms, h/a, dizziness, n/v, or fever/chills.     Past Medical History  Diagnosis Date  . Anxiety   . Depression   . Abnormal uterine bleeding   . Type 2 diabetes mellitus   . Endometriosis of pelvis   . Sinus tachycardia   . Chronic pelvic pain in female   . History of ketoacidosis     01-15-2014  . PONV (postoperative nausea and vomiting)   . Wears contact lenses   . Arthritis     knees  . Fibromyalgia    Past Surgical History  Procedure Laterality Date  . Cesarean section  2002,  2005,  2008  . Breast reduction surgery  age 24  . Mandible surgery  age 89    Correct overbite  . Dilation and curettage of uterus N/A 02/13/2014    Procedure: DILATATION AND CURETTAGE;  Surgeon: Everitt Amber, MD;  Location: [facility];  Service: Gynecology;  Laterality: N/A;   History   Social History  . Marital Status: Married    Spouse Name: N/A  .  Number of Children: N/A  . Years of Education: N/A   Occupational History  . Not on file.   Social History Main Topics  . Smoking status: Never Smoker   . Smokeless tobacco: Never Used  . Alcohol Use: No  . Drug Use: No  . Sexual Activity: Yes    Birth Control/ Protection: Pill   Other Topics Concern  . Not on file   Social History Narrative   Current Facility-Administered Medications on File Prior to Encounter  Medication Dose Route Frequency Provider Last Rate Last Dose  . 0.9 %  sodium chloride infusion   Intravenous Continuous Everitt Amber, MD      . insulin aspart (novoLOG) injection    Anesthesia Intra-op Sharlette Dense, CRNA   2 Units at 01/14/14 [phone]  . lactated ringers infusion    Continuous PRN Sharlette Dense, CRNA      . metoprolol (LOPRESSOR) injection    Anesthesia Intra-op Sharlette Dense, CRNA   1 mg at 01/14/14 [id number]   Current Outpatient Prescriptions on File Prior to Encounter  Medication Sig Dispense Refill  . acetaminophen (TYLENOL) 500 MG tablet Take 500 mg by mouth every 6 (six) hours as needed for mild pain.    Marland Kitchen ALPRAZolam (XANAX) 1 MG tablet Take 1 mg by mouth at bedtime as needed  for anxiety.    . cyclobenzaprine (FLEXERIL) 10 MG tablet Take 10 mg by mouth at bedtime as needed for muscle spasms.     . DULoxetine (CYMBALTA) 60 MG capsule Take 60 mg by mouth every morning.    . gabapentin (NEURONTIN) 300 MG capsule Take 300 mg by mouth 3 (three) times daily.    . saxagliptin HCl (ONGLYZA) 5 MG TABS tablet Take 5 mg by mouth every morning.    . Oxycodone HCl 10 MG TABS Take 1 tablet (10 mg total) by mouth every 4 (four) hours as needed (moderate to severe pain). (Patient not taking: Reported on 02/21/2014) 60 tablet 0  . Oxycodone HCl 10 MG TABS Take 1 tablet (10 mg total) by mouth every 4 (four) hours as needed. (Patient not taking: Reported on 02/21/2014) 50 tablet 0  . promethazine (PHENERGAN) 25 MG tablet Take 0.5-1 tablets (12.5-25 mg total) by mouth every  6 (six) hours as needed for nausea or vomiting. (Patient not taking: Reported on 02/21/2014) 30 tablet 0   No Known Allergies  ROS: Pertinent items in HPI  OBJECTIVE Blood pressure 122/70, pulse 119, temperature 98 F (36.7 C), temperature source Oral, resp. rate 18, weight 133.358 kg (294 lb). GENERAL: Well-developed, well-nourished female in no acute distress.  HEENT: Normocephalic HEART: normal rate RESP: normal effort ABDOMEN: Soft, non-tender EXTREMITIES: Nontender, no edema NEURO: Alert and oriented Pelvic exam: Cervix pink, visually closed, without lesion, scant white creamy discharge, vaginal walls and external genitalia normal Bimanual exam: Cervix 0/long/high, firm, anterior, neg CMT, unable to palpate uterus or adnexa related to body habitus, no mass palpable, minimal tenderness on exam after pain medication   LAB RESULTS Results for orders placed or performed during the hospital encounter of 02/21/14 (from the past 24 hour(s))  Urinalysis, Routine w reflex microscopic     Status: Abnormal   Collection Time: 02/21/14  5:00 PM  Result Value Ref Range   Color, Urine YELLOW YELLOW   APPearance CLEAR CLEAR   Specific Gravity, Urine 1.015 1.005 - 1.030   pH 5.5 5.0 - 8.0   Glucose, UA NEGATIVE NEGATIVE mg/dL   Hgb urine dipstick SMALL (A) NEGATIVE   Bilirubin Urine NEGATIVE NEGATIVE   Ketones, ur NEGATIVE NEGATIVE mg/dL   Protein, ur NEGATIVE NEGATIVE mg/dL   Urobilinogen, UA 0.2 0.0 - 1.0 mg/dL   Nitrite NEGATIVE NEGATIVE   Leukocytes, UA NEGATIVE NEGATIVE  Urine microscopic-add on     Status: Abnormal   Collection Time: 02/21/14  5:00 PM  Result Value Ref Range   Squamous Epithelial / LPF FEW (A) RARE   RBC / HPF 0-2 <3 RBC/hpf  Pregnancy, urine POC     Status: None   Collection Time: 02/21/14  5:37 PM  Result Value Ref Range   Preg Test, Ur NEGATIVE NEGATIVE    IMAGING US Transvaginal Non-ob  02/21/2014   CLINICAL DATA:  39 year old female with severe pelvic  pain. Status post dilatation and curettage 02/13/2014. Initial encounter. Morbid obesity.  EXAM: TRANSABDOMINAL AND TRANSVAGINAL ULTRASOUND OF PELVIS  TECHNIQUE: Both transabdominal and transvaginal ultrasound examinations of the pelvis were performed. Transabdominal technique was performed for global imaging of the pelvis including uterus, ovaries, adnexal regions, and pelvic cul-de-sac. It was necessary to proceed with endovaginal exam following the transabdominal exam to visualize the ovaries.  COMPARISON:  12/31/2013 and earlier.  FINDINGS: Uterus  Measurements: Poorly visualized com approximately 7.8 cm in length. No fibroids or other mass visualized.  Endometrium  Thickness: Could not  be delineated.  Right ovary  Measurements: Not visualized despite trans abdominal and transvaginal imaging.  Left ovary  Measurements: In a visualized despite trans abdominal and transvaginal imaging.  Other findings  No free fluid identified.  IMPRESSION: Exam very limited by patient body habitus. Endometrium and ovaries not evaluated.   Electronically Signed   By: Genevie Ann M.D.   On: 02/21/2014 21:05   US Pelvis Complete  02/21/2014   CLINICAL DATA:  39 year old female with severe pelvic pain. Status post dilatation and curettage 02/13/2014. Initial encounter. Morbid obesity.  EXAM: TRANSABDOMINAL AND TRANSVAGINAL ULTRASOUND OF PELVIS  TECHNIQUE: Both transabdominal and transvaginal ultrasound examinations of the pelvis were performed. Transabdominal technique was performed for global imaging of the pelvis including uterus, ovaries, adnexal regions, and pelvic cul-de-sac. It was necessary to proceed with endovaginal exam following the transabdominal exam to visualize the ovaries.  COMPARISON:  12/31/2013 and earlier.  FINDINGS: Uterus  Measurements: Poorly visualized com approximately 7.8 cm in length. No fibroids or other mass visualized.  Endometrium  Thickness: Could not be delineated.  Right ovary  Measurements: Not  visualized despite trans abdominal and transvaginal imaging.  Left ovary  Measurements: In a visualized despite trans abdominal and transvaginal imaging.  Other findings  No free fluid identified.  IMPRESSION: Exam very limited by patient body habitus. Endometrium and ovaries not evaluated.   Electronically Signed   By: Genevie Ann M.D.   On: 02/21/2014 21:05    ASSESSMENT 1. Chronic pelvic pain in female     PLAN Consult Dr Nelda Marseille.  Premedicate with Dilaudid for pelvic exam/U/S D/C home if pelvic exam/ultrasound wnl Oxycodone 10 mg Q 4-6 hours (4 tabs/day) for 14 days, #56 Pt to f/u in office with Dr Nelda Marseille, call on Monday to make appt  Return to MAU as needed for emergencies     Medication List    ASK your doctor about these medications        acetaminophen 500 MG tablet  Commonly known as:  TYLENOL  Take 500 mg by mouth every 6 (six) hours as needed for mild pain.     ALPRAZolam 1 MG tablet  Commonly known as:  XANAX  Take 1 mg by mouth at bedtime as needed for anxiety.     cyclobenzaprine 10 MG tablet  Commonly known as:  FLEXERIL  Take 10 mg by mouth at bedtime as needed for muscle spasms.     DULoxetine 60 MG capsule  Commonly known as:  CYMBALTA  Take 60 mg by mouth every morning.     gabapentin 300 MG capsule  Commonly known as:  NEURONTIN  Take 300 mg by mouth 3 (three) times daily.     metFORMIN 500 MG tablet  Commonly known as:  GLUCOPHAGE  Take by mouth 2 (two) times daily with a meal. Patient takes 2 tablets in the morning and 2 at night     Oxycodone HCl 10 MG Tabs  Take 1 tablet (10 mg total) by mouth every 4 (four) hours as needed (moderate to severe pain).     Oxycodone HCl 10 MG Tabs  Take 1 tablet (10 mg total) by mouth every 4 (four) hours as needed.     promethazine 25 MG tablet  Commonly known as:  PHENERGAN  Take 0.5-1 tablets (12.5-25 mg total) by mouth every 6 (six) hours as needed for nausea or vomiting.     saxagliptin HCl 5 MG Tabs tablet   Commonly known as:  ONGLYZA  Take  5 mg by mouth every morning.         Fatima Blank Certified Nurse-Midwife 02/21/2014  8:25 PM    Discussed pain management with Dr. Nelda Marseille. RX printed by CNM for percocet, however RX was not signed Per Dr. Nelda Marseille the patient can be given Oxycodone 10 mg #56 with no refills. The patient is awaiting an appointment at the pain clinic in [place]  Discussed US findings with Dr. Nelda Marseille and the patient.  Darrelyn Hillock Rasch, NP  02/21/2014 9:59 PM

## 2014-02-28 ENCOUNTER — Encounter: Payer: Self-pay | Admitting: Gynecologic Oncology

## 2014-02-28 ENCOUNTER — Ambulatory Visit: Payer: BLUE CROSS/BLUE SHIELD | Attending: Gynecologic Oncology | Admitting: Gynecologic Oncology

## 2014-02-28 DIAGNOSIS — Z794 Long term (current) use of insulin: Secondary | ICD-10-CM | POA: Diagnosis not present

## 2014-02-28 DIAGNOSIS — R102 Pelvic and perineal pain: Secondary | ICD-10-CM | POA: Diagnosis not present

## 2014-02-28 DIAGNOSIS — E1165 Type 2 diabetes mellitus with hyperglycemia: Secondary | ICD-10-CM | POA: Diagnosis not present

## 2014-02-28 DIAGNOSIS — Z6841 Body Mass Index (BMI) 40.0 and over, adult: Secondary | ICD-10-CM | POA: Diagnosis not present

## 2014-02-28 DIAGNOSIS — R1032 Left lower quadrant pain: Secondary | ICD-10-CM

## 2014-02-28 DIAGNOSIS — N736 Female pelvic peritoneal adhesions (postinfective): Secondary | ICD-10-CM | POA: Insufficient documentation

## 2014-02-28 DIAGNOSIS — F1120 Opioid dependence, uncomplicated: Secondary | ICD-10-CM | POA: Insufficient documentation

## 2014-02-28 DIAGNOSIS — G8929 Other chronic pain: Secondary | ICD-10-CM | POA: Insufficient documentation

## 2014-02-28 DIAGNOSIS — Z48816 Encounter for surgical aftercare following surgery on the genitourinary system: Secondary | ICD-10-CM | POA: Insufficient documentation

## 2014-02-28 DIAGNOSIS — N803 Endometriosis of pelvic peritoneum: Secondary | ICD-10-CM

## 2014-02-28 DIAGNOSIS — N8501 Benign endometrial hyperplasia: Secondary | ICD-10-CM | POA: Diagnosis not present

## 2014-02-28 MED ORDER — ZOLPIDEM TARTRATE 5 MG PO TABS: 5.0000 mg | ORAL_TABLET | Freq: Every evening | ORAL | Status: DC | PRN

## 2014-02-28 NOTE — Patient Instructions (Signed)
Use caution with taking Ambien and other medications.  [facility] to inquire about bariatric surgery.  Please call for any questions or concerns.

## 2014-02-28 NOTE — Progress Notes (Signed)
POSTOPERATIVE FOLLOWUP  HPI:  Christine Grant is a 39 y.o. year old [id number] initially seen in consultation on 10/08/14 for chronic pelvic pain, narcotic dependency and desire for surgical intervention.  As part of her workup and vaginal ultrasound which showed a thickened endometrial stripe. Given her morbid obesity and risk for underlying hyperplasia or malignancy she was recommended endometrial sampling. She then underwent a D&C on 08/18/87 without complications.  Her postoperative course was uncomplicated.  Her final pathology revealed benign secretory endometrium.  Taqwa's pain  has continued to be very difficult to be controlled. Postoperatively she requested narcotic prescription. When this was attempted to be filled the pharmacy informed us that due to her prior prescription being within its expiration she was not able to have a repeat filling of her narcotics. She became very upset with this called our office multiple times. She presented to the [facility] emergency room on 02/21/2014 where transvaginal ultrasound was performed for her reports of pelvic pain. It was unremarkable ultrasound. Her workup was unremarkable and she was given a prescription for 50 tablets of 10 mg oxycodone. Today she continues to request additional narcotics initially overlooking this prescription that was provided one week ago. When reminded of this she acknowledges that she does been provided with 50 tablets per reports that this is not enough. I informed narrative that I would not be providing her with an additional narcotic prescription.   She is seen today for a postoperative check and to discuss her pathology results and ongoing plan.  Since her procedure she is feeling persistent pelvic pain particularly sharply on the left. It is constant and exacerbating. She has minimal improvement with any meds other than narcotics which she needs to take every 4 hours.  She is very frustrated and believes that there is  something causing pain require surgical intervention.   She has poorly controlled DM (HbA1C >8) and had her initial D&C cancelled secondary to presenting for surgery with ketoacidosis.  She was informed that she has severe adhesiosn between her lower uterine segment and her abdomen  Which were identified at the time of c section, and that this was the etiology of her pain.   Review of systems: Constitutional:  She has no weight gain or weight loss. She has no fever or chills. Eyes: No blurred vision Ears, Nose, Mouth, Throat: No dizziness, headaches or changes in hearing. No mouth sores. Cardiovascular: No chest pain, palpitations or edema. Respiratory:  No shortness of breath, wheezing or cough Gastrointestinal: She has normal bowel movements without diarrhea or constipation. She denies any nausea or vomiting. She denies blood in her stool or heart burn. Genitourinary:  + pelvic pain, no pelvic pressure or changes in her urinary function. She has no hematuria, dysuria, or incontinence. She has no irregular vaginal bleeding or vaginal discharge Musculoskeletal: Denies muscle weakness or joint pains.  Skin:  She has no skin changes, rashes or itching Neurological:  Denies dizziness or headaches. No neuropathy, no numbness or tingling. Psychiatric:  She denies depression or anxiety. Hematologic/Lymphatic:   No easy bruising or bleeding   Physical Exam: Blood pressure 151/94, pulse 125, temperature 97.6 F (36.4 C), temperature source Oral, resp. rate 20, height 5\' 6"  (1.676 m), weight 294 lb 3.2 oz (133.448 kg). General: Well dressed, well nourished in no apparent distress.   HEENT:  Normocephalic and atraumatic, no lesions.  Extraocular muscles intact. Sclerae anicteric. Pupils equal, round, reactive. No mouth sores or ulcers. Thyroid is normal size, not nodular,  midline. Skin:  No lesions or rashes. Breasts:  deferred Lungs:  deferred. Cardiovascular:  deferred Abdomen:  deferred    Genitourinary:deferred due to [place] 1 weeks ago which was normal. Extremities: No cyanosis, clubbing or edema.  No calf tenderness or erythema. No palpable cords. Psychiatric: Mood and affect are appropriate. Neurological: Awake, alert and oriented x 3. Sensation is intact, no neuropathy.  Musculoskeletal: No pain, normal strength and range of motion.  Assessment:    39 y.o. year old with chronic pelvic pain.   S/p D&C on 02/13/14 for normal pathology. No apparent organic source of her pain.  Plan: 1) Pathology reports reviewed today (benign endometrium) 2) Treatment counseling - I discussed that I do not have expertise in pelvic pain, and that I am not willing to perform major abdominal surgery until she has been thoroughly evaluated by pelvic pain specialists to confirm that there is a likely anatomic etiology for her pain which can be alleviated by surgery as I do not believe that uterine adhesions are likely to be a source for the nature of her symptoms. Her morbid obesity and poorly controlled diabetes place her at an unacceptably high risk for perioperative morbidity, and her apparent dependency on narcotics is likely to make postoperative pain control extremely difficult. I discussed that the possibility of a surgery in alleviating all of her narcotic needs is low given the extreme requirements she has at present (preop). I believe she will be best served in a relationship with a physician/surgeon who can establish pain management contracts with her and has the skills and knowledge to assess for all functional and anatomic etiologies for pain. I discussed that I am not prepared to prescribe additional narcotics for her.  She was given the opportunity to ask questions, which were answered to her satisfaction, and she is agreement with the above mentioned plan of care. She has an appointment with the Pelvic pain Clinic in [place] on 03/10/14. I have prescribed her Lorrin Mais 5mg  (15 tabs) to assist in  sleep prior to that appointment. I discussed risks of adding sedatives to narcotics (including risk of Antonini from apnea). I warned against alcohol ingestion while using the xanax, narcotics and ambien.  3)  No scheduled followup to clinic is necessary.  Donaciano Eva, MD

## 2014-03-06 ENCOUNTER — Other Ambulatory Visit (HOSPITAL_COMMUNITY)
Admission: RE | Admit: 2014-03-06 | Discharge: 2014-03-06 | Disposition: A | Discharge status: Home / Self Care | Payer: BLUE CROSS/BLUE SHIELD | Source: Ambulatory Visit | Attending: Obstetrics & Gynecology | Admitting: Obstetrics & Gynecology

## 2014-03-06 ENCOUNTER — Other Ambulatory Visit: Payer: Self-pay | Admitting: Obstetrics & Gynecology

## 2014-03-06 DIAGNOSIS — Z01419 Encounter for gynecological examination (general) (routine) without abnormal findings: Secondary | ICD-10-CM | POA: Insufficient documentation

## 2014-03-06 DIAGNOSIS — Z1151 Encounter for screening for human papillomavirus (HPV): Secondary | ICD-10-CM | POA: Insufficient documentation

## 2014-03-06 LAB — HM PAP SMEAR: HM Pap smear: NEGATIVE

## 2014-03-10 LAB — CYTOLOGY - PAP

## 2014-06-05 ENCOUNTER — Emergency Department (INDEPENDENT_AMBULATORY_CARE_PROVIDER_SITE_OTHER)
Admission: EM | Admit: 2014-06-05 | Discharge: 2014-06-05 | Disposition: A | Discharge status: Home / Self Care | Payer: BLUE CROSS/BLUE SHIELD | Source: Home / Self Care | Attending: Emergency Medicine | Admitting: Emergency Medicine

## 2014-06-05 DIAGNOSIS — M5416 Radiculopathy, lumbar region: Secondary | ICD-10-CM | POA: Diagnosis not present

## 2014-06-05 NOTE — ED Provider Notes (Signed)
CSN: [id number]     Arrival date & time 06/05/14  1726 History   First MD Initiated Contact with Patient 06/05/14 1748     Chief Complaint  Patient presents with  . Back Pain   Here with husband HPI 4 days of Sharp and dull right lumbar pain radiating to right lateral thigh just above the knee, but not beyond. Pain can be as high as 7 out of 10. Vague feeling of numbness right lateral thigh but no other numbness or focal neurologic symptoms. No focal weakness or bowel or bladder dysfunction. Bending and movement can exacerbate the pain. Has not tried any particular medication for this. She denies any specific injury. She did have a hysterectomy 05/23/2014. She called surgeon who does not believe it is due to the surgery as her abdominal and surgical soreness post op had been resolved prior to this pain.  Past Medical History  Diagnosis Date  . Anxiety   . Depression   . Abnormal uterine bleeding   . Type 2 diabetes mellitus   . Endometriosis of pelvis   . Sinus tachycardia   . Chronic pelvic pain in female   . History of ketoacidosis     01-15-2014  . PONV (postoperative nausea and vomiting)   . Wears contact lenses   . Arthritis     knees  . Fibromyalgia    Past Surgical History  Procedure Laterality Date  . Cesarean section  2002,  2005,  2008  . Breast reduction surgery  age 35  . Mandible surgery  age 89    Correct overbite  . Dilation and curettage of uterus N/A 02/13/2014    Procedure: DILATATION AND CURETTAGE;  Surgeon: Everitt Amber, MD;  Location: [facility];  Service: Gynecology;  Laterality: N/A;   Family History  Problem Relation Age of Onset  . Adopted: Yes  . Hyperlipidemia Mother   . Hypertension Mother    History  Substance Use Topics  . Smoking status: Never Smoker   . Smokeless tobacco: Never Used  . Alcohol Use: No   OB History    Gravida Para Term Preterm AB TAB SAB Ectopic Multiple Living   4 3 3       3      Review of  Systems Remainder of Review of Systems negative for acute change except as noted in the HPI.  Allergies  Review of patient's allergies indicates no known allergies.  Home Medications   Prior to Admission medications   Medication Sig Start Date End Date Taking? Authorizing Provider  acetaminophen (TYLENOL) 500 MG tablet Take 500 mg by mouth every 6 (six) hours as needed for mild pain.    Historical Provider, MD  ALPRAZolam Duanne Moron) 1 MG tablet Take 1 mg by mouth at bedtime as needed for anxiety.    Historical Provider, MD  cyclobenzaprine (FLEXERIL) 10 MG tablet Take 10 mg by mouth at bedtime as needed for muscle spasms.     Historical Provider, MD  DULoxetine (CYMBALTA) 60 MG capsule Take 60 mg by mouth every morning.    Historical Provider, MD  gabapentin (NEURONTIN) 300 MG capsule Take 300 mg by mouth 3 (three) times daily.    Historical Provider, MD  metFORMIN (GLUCOPHAGE) 500 MG tablet Take by mouth 2 (two) times daily with a meal. Patient takes 2 tablets in the morning and 2 at night    Historical Provider, MD  Promethazine HCl (PHENERGAN PO) Take 1 tablet by mouth every 6 (six) hours  as needed.    Historical Provider, MD  saxagliptin HCl (ONGLYZA) 5 MG TABS tablet Take 5 mg by mouth every morning.    Historical Provider, MD  zolpidem (AMBIEN) 5 MG tablet Take 1 tablet (5 mg total) by mouth at bedtime as needed for sleep. 02/28/14   Everitt Amber, MD   BP 135/93 mmHg  Pulse 143  Temp(Src) 97.5 F (36.4 C) (Oral)  Ht 5\' 6"  (1.676 m)  Wt 270 lb (122.471 kg)  BMI 43.60 kg/m2  SpO2 99%  LMP 01/14/2014 Physical Exam  Constitutional: She is oriented to person, place, and time. She appears well-developed and well-nourished. She is cooperative.  Non-toxic appearance. She appears distressed (Appears uncomfortable from low back pain.).  HENT:  Head: Normocephalic and atraumatic.  Mouth/Throat: Oropharynx is clear and moist.  Eyes: EOM are normal. Pupils are equal, round, and reactive to  light. No scleral icterus.  Neck: Neck supple.  Cardiovascular: Regular rhythm and normal heart sounds.   Pulmonary/Chest: Effort normal and breath sounds normal. No respiratory distress. She has no wheezes. She has no rales. She exhibits no tenderness.  Abdominal: Soft. There is no tenderness.  Musculoskeletal:       Right hip: Normal.       Left hip: Normal.       Cervical back: She exhibits no tenderness.       Thoracic back: She exhibits no tenderness.       Lumbar back: She exhibits decreased range of motion, tenderness (Especially right paralumbar area) and spasm. She exhibits no bony tenderness, no swelling, no edema, no deformity, no laceration and normal pulse.  + Right straight leg-raise test. Negative Left straight leg-raise test.  Negative Right Saralyn Pilar test. Negative Left Saralyn Pilar test.    Neurological: She is alert and oriented to person, place, and time. She has normal strength. She displays no atrophy, no tremor and normal reflexes. No cranial nerve deficit. She exhibits normal muscle tone. Gait normal.  Reflex Scores:      Patellar reflexes are 2+ on the right side and 2+ on the left side.      Achilles reflexes are 2+ on the right side and 2+ on the left side. No sensory deficit lower extremities, except questionable decreased sensation right lateral 5.  Skin: Skin is warm, dry and intact. No lesion and no rash noted.  Psychiatric: She has a normal mood and affect.  Nursing note and vitals reviewed.   ED Course  Procedures (including critical care time) Labs Review Labs Reviewed - No data to display  Imaging Review No results found.   MDM   1. Lumbar radicular pain    Workup and Treatment options discussed, as well as risks, benefits, alternatives. She declined any imaging at this time. Patient voiced understanding and agreement with the following plans: Written prescriptions given: Prednisone 10 mg-six-day dosepak Vicodin 5/325. #12. No refills. One or  2 by mouth every 4-6 hours when necessary severe pain.  Other symptomatic care discussed. Follow-up with orthopedist or neurosurgeon in 5-7 days if not improving, or sooner if symptoms become worse. Precautions discussed. Red flags discussed. Questions invited and answered. Patient and husband voiced understanding and agreement.     Jacqulyn Cane, MD 06/05/14 2136

## 2014-06-05 NOTE — ED Notes (Signed)
Pt c/o low back pain that radiates to right leg x 4 days without injury. H/o herniated disc. She did have a hysterectomy 05/23/14. She called surgeon who does not believe it is due to the surgery as her soreness post op had been resolved prior to this pain.

## 2014-06-11 ENCOUNTER — Ambulatory Visit (INDEPENDENT_AMBULATORY_CARE_PROVIDER_SITE_OTHER): Payer: BLUE CROSS/BLUE SHIELD | Admitting: Sports Medicine

## 2014-06-11 ENCOUNTER — Encounter: Payer: Self-pay | Admitting: Sports Medicine

## 2014-06-11 ENCOUNTER — Ambulatory Visit (INDEPENDENT_AMBULATORY_CARE_PROVIDER_SITE_OTHER): Payer: BLUE CROSS/BLUE SHIELD

## 2014-06-11 VITALS — BP 164/89 | HR 93 | Ht 66.5 in | Wt 281.0 lb

## 2014-06-11 DIAGNOSIS — M5416 Radiculopathy, lumbar region: Secondary | ICD-10-CM

## 2014-06-11 DIAGNOSIS — M545 Low back pain, unspecified: Secondary | ICD-10-CM | POA: Insufficient documentation

## 2014-06-11 DIAGNOSIS — M79604 Pain in right leg: Secondary | ICD-10-CM | POA: Insufficient documentation

## 2014-06-11 DIAGNOSIS — L918 Other hypertrophic disorders of the skin: Secondary | ICD-10-CM | POA: Diagnosis not present

## 2014-06-11 MED ORDER — MELOXICAM 15 MG PO TABS: ORAL_TABLET | ORAL | Status: DC

## 2014-06-11 MED ORDER — LIDOCAINE 5 % EX OINT: 1.0000 "application " | TOPICAL_OINTMENT | Freq: Every day | CUTANEOUS | Status: DC

## 2014-06-11 MED ORDER — CYCLOBENZAPRINE HCL 10 MG PO TABS: 10.0000 mg | ORAL_TABLET | Freq: Every evening | ORAL | Status: DC | PRN

## 2014-06-11 NOTE — Progress Notes (Signed)
   Subjective:    I'm seeing this patient as a consultation for:  Dr. Burnett Harry, Dr. Kenton Kingfisher  CC: Low back pain  HPI: This is a very pleasant 39 year old female with a history of lumbar degenerative disc disease and lumbar radiculopathy comes in with a new onset right-sided lumbar radiculopathy, radiating down the posterior aspect of the right thigh, posterior aspect of the right calf, but not past the foot. Symptoms are moderate, persistent with axial pain worse with flexion and sitting.  Past medical history, Surgical history, Family history not pertinant except as noted below, Social history, Allergies, and medications have been entered into the medical record, reviewed, and no changes needed.   Review of Systems: No headache, visual changes, nausea, vomiting, diarrhea, constipation, dizziness, abdominal pain, skin rash, fevers, chills, night sweats, weight loss, swollen lymph nodes, body aches, joint swelling, muscle aches, chest pain, shortness of breath, mood changes, visual or auditory hallucinations.   Objective:   General: Well Developed, well nourished, and in no acute distress.  Neuro/Psych: Alert and oriented x3, extra-ocular muscles intact, able to move all 4 extremities, sensation grossly intact. Skin: Warm and dry, no rashes noted.  Respiratory: Not using accessory muscles, speaking in full sentences, trachea midline.  Cardiovascular: Pulses palpable, no extremity edema. Abdomen: Does not appear distended. Back Exam:  Inspection: Unremarkable  Motion: Flexion 45 deg, Extension 45 deg, Side Bending to 45 deg bilaterally,  Rotation to 45 deg bilaterally  SLR laying: Negative  XSLR laying: Negative  Palpable tenderness: None. FABER: negative. Sensory change: Gross sensation intact to all lumbar and sacral dermatomes.  Reflexes: 2+ at both patellar tendons, 2+ at achilles tendons, Babinski's downgoing.  Strength at foot  Plantar-flexion: 5/5 Dorsi-flexion: 5/5 Eversion: 5/5  Inversion: 5/5  Leg strength  Quad: 5/5 Hamstring: 5/5 Hip flexor: 5/5 Hip abductors: 5/5  Gait unremarkable.  X-rays personally reviewed and showed very clear L5-S1 degenerative disc disease with multiple bridging anterior osteophytes at other levels.  Impression and Recommendations:   This case required medical decision making of moderate complexity.

## 2014-06-11 NOTE — Assessment & Plan Note (Signed)
4 on the face and one on the left arm, return for excision/cryotherapy.

## 2014-06-11 NOTE — Assessment & Plan Note (Signed)
Right-sided S1 radiculopathy, continue prednisone prescribed at urgent care, adding cyclobenzaprine, meloxicam, formal physical therapy and x-rays, return in one month, MRI for interventional planning if no better.

## 2014-06-12 ENCOUNTER — Telehealth: Payer: Self-pay

## 2014-06-12 DIAGNOSIS — M5416 Radiculopathy, lumbar region: Secondary | ICD-10-CM

## 2014-06-12 MED ORDER — CYCLOBENZAPRINE HCL 10 MG PO TABS: 10.0000 mg | ORAL_TABLET | Freq: Three times a day (TID) | ORAL | Status: DC

## 2014-06-12 NOTE — Telephone Encounter (Signed)
Christine Grant states the Flexeril was sent in for qhs and it should have been for TID. Please advise.

## 2014-06-12 NOTE — Telephone Encounter (Signed)
Done

## 2014-06-13 ENCOUNTER — Telehealth: Payer: Self-pay | Admitting: *Deleted

## 2014-06-13 DIAGNOSIS — M5416 Radiculopathy, lumbar region: Secondary | ICD-10-CM

## 2014-06-13 MED ORDER — TRAMADOL HCL 50 MG PO TABS: ORAL_TABLET | ORAL | Status: DC

## 2014-06-13 NOTE — Telephone Encounter (Signed)
Pt left vm today stating that the meloxicam & flexeril are not helping so she wants to know what the next step is.  Please advise.

## 2014-06-13 NOTE — Telephone Encounter (Signed)
We just started, I am going to add a bit of tramadol, but she needs to give the other medications as well as therapy some time to work.

## 2014-06-16 NOTE — Telephone Encounter (Signed)
Pt notified of rx & MD recommendations.

## 2014-06-17 ENCOUNTER — Ambulatory Visit: Payer: BLUE CROSS/BLUE SHIELD | Admitting: Rehabilitative and Restorative Service Providers"

## 2014-06-26 ENCOUNTER — Ambulatory Visit: Payer: BLUE CROSS/BLUE SHIELD | Admitting: Rehabilitative and Restorative Service Providers"

## 2014-06-30 ENCOUNTER — Encounter: Payer: Self-pay | Admitting: Rehabilitative and Restorative Service Providers"

## 2014-06-30 ENCOUNTER — Ambulatory Visit (INDEPENDENT_AMBULATORY_CARE_PROVIDER_SITE_OTHER): Payer: BLUE CROSS/BLUE SHIELD | Admitting: Rehabilitative and Restorative Service Providers"

## 2014-06-30 DIAGNOSIS — M2560 Stiffness of unspecified joint, not elsewhere classified: Secondary | ICD-10-CM | POA: Diagnosis not present

## 2014-06-30 DIAGNOSIS — M545 Low back pain: Secondary | ICD-10-CM | POA: Diagnosis not present

## 2014-06-30 DIAGNOSIS — R29898 Other symptoms and signs involving the musculoskeletal system: Secondary | ICD-10-CM | POA: Diagnosis not present

## 2014-06-30 DIAGNOSIS — Z7409 Other reduced mobility: Secondary | ICD-10-CM

## 2014-06-30 DIAGNOSIS — R6889 Other general symptoms and signs: Secondary | ICD-10-CM

## 2014-06-30 DIAGNOSIS — R531 Weakness: Secondary | ICD-10-CM

## 2014-06-30 NOTE — Patient Instructions (Signed)
Ball release work -  Avoid sitting with ankles, knees or hips crossed.  Avoid sitting with one leg tucked up under you.  Abdominal Bracing With Pelvic Floor (Hook-Lying)   With neutral spine, tighten pelvic floor and abdominals(belly button to back bone), tighten muscles at waist. Hold 10 sec.  Repeat _10__ times. Do _several__ times a day.    Piriformis Stretch   Lying on back, place right foot across left leg foot flat on surface pull right knee across your body.  Hold _20-30___ seconds. Repeat _3___ times. Do __3-4__ sessions per day.

## 2014-06-30 NOTE — Therapy (Addendum)
[facility] [place] [place], [place], [address] Phone: [phone]   Fax:  [phone]  Physical Therapy Evaluation  Patient Details  Name: Christine Grant MRN: [id number] Date of Birth: 09-Oct-1975 Referring Provider:  Silverio Decamp,*  Encounter Date: 06/30/2014      PT End of Session - 06/30/14 1535    Visit Number 1   Number of Visits 16   Date for PT Re-Evaluation 08/25/14   PT Start Time 0240   PT Stop Time 0342   PT Time Calculation (min) 62 min   Activity Tolerance Patient tolerated treatment well;No increased pain   Behavior During Therapy [facility] for tasks assessed/performed      Past Medical History  Diagnosis Date  . Anxiety   . Depression   . Abnormal uterine bleeding   . Type 2 diabetes mellitus   . Endometriosis of pelvis   . Sinus tachycardia   . Chronic pelvic pain in female   . History of ketoacidosis     01-15-2014  . PONV (postoperative nausea and vomiting)   . Wears contact lenses   . Arthritis     knees  . Fibromyalgia     Past Surgical History  Procedure Laterality Date  . Cesarean section  2002,  2005,  2008  . Breast reduction surgery  age 33  . Mandible surgery  age 46    Correct overbite  . Dilation and curettage of uterus N/A 02/13/2014    Procedure: DILATATION AND CURETTAGE;  Surgeon: Everitt Amber, MD;  Location: [facility];  Service: Gynecology;  Laterality: N/A;    There were no vitals filed for this visit.  Visit Diagnosis:  Right low back pain, with sciatica presence unspecified - Plan: PT plan of care cert/re-cert  Stiffness in joint - Plan: PT plan of care cert/re-cert  Weakness of right lower extremity - Plan: PT plan of care cert/re-cert  Decreased strength, endurance, and mobility - Plan: PT plan of care cert/re-cert      Subjective Assessment - 06/30/14 1445    Subjective LBP for past year or more - had a hysterectomy 05/23/14 with no  improvement in LBP. Pain radiates down the Rt hip and lateral thigh to her knee.   Pertinent History Hysterectomy 05/23/14    How long can you sit comfortably? 5-10 min   How long can you stand comfortably? 10-20 min   How long can you walk comfortably? 20-30 min   Diagnostic tests x-rays - DDD   Patient Stated Goals Get the painunder control   Currently in Pain? Yes   Pain Score 6    Pain Location Back   Pain Orientation Lower   Pain Radiating Towards toward rt buttocks and outter thigh to knee   Pain Onset More than a month ago   Pain Frequency Constant   Aggravating Factors  sitting, standing, doing dishes, lifting, bending,    Pain Relieving Factors heat, medicine   Effect of Pain on Daily Activities changes in activity level            [facility] PT Assessment - 06/30/14 0001    Assessment   Medical Diagnosis LBP   Onset Date/Surgical Date 05/25/14   Hand Dominance Right   Next MD Visit 07/09/14   Balance Screen   Has the patient fallen in the past 6 months Yes   How many times? 1   Has the patient had a decrease in activity level because of a  fear of falling?  No   Is the patient reluctant to leave their home because of a fear of falling?  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   Leisure kids/household chores   Observation/Other Assessments   Focus on Therapeutic Outcomes (FOTO)  47% limitation   Sensation   Light Touch --  decreased lateral Rt thigh   Posture/Postural Control   Posture Comments Head forward; shoulders rounded and elevated; head of the humerus anterior in orientation; flexed forward at  trunk and hips; UE's in IR at sides   AROM   Overall AROM Comments ROM limited by obesity   Lumbar Flexion 60%  painful - decreased with reps   Lumbar Extension 40%  painful pull through incisional areas anteriorly   Lumbar - Right Side Bend 50%  painful Lt LB   Lumbar - Left Side Bend 40%   Lumbar - Right Rotation 45%  painful   Lumbar - Left Rotation 45%  painful   Strength   Overall Strength Comments WFL's except Rt hip abduction and extension 4+/5   Palpation   Spinal mobility painful through LB and towards Rt lumbar region into the Rt piriformis and hip abductor area - patient unable to lie prone for evaluation     Treatment consisted of education; exercise instruction; HEP; myofacial ball release work and instruction for home; ice pack.       PT Education - 06/30/14 1534    Education provided Yes   Education Details Education re DDD; importance of spinal hea;th and exercise; HEP instruction   Person(s) Educated Patient   Methods Explanation;Demonstration;Tactile cues;Verbal cues;Handout   Comprehension Verbalized understanding;Returned demonstration;Verbal cues required;Tactile cues required          PT Short Term Goals - 06/30/14 1545    PT SHORT TERM GOAL #1   Title Patient I in initial HEP(07/15/14)   Time 2   Period Weeks   Status New   PT SHORT TERM GOAL #2   Title Progress with lumbar stabilization exercise program (07/15/14)   Time 2   Period Weeks   Status New           PT Long Term Goals - 06/30/14 1546    PT LONG TERM GOAL #1   Title Patient I in advanced HEP (08/25/14)   Time 8   Period Weeks   Status New   PT LONG TERM GOAL #2   Title Increase in Rt LE strength to 5/5 (08/25/14)   Time 8   Period Weeks   Status New   PT LONG TERM GOAL #3   Title Decrease pain frequency and intensity by 50% (08/25/14)   Time 8   Period Weeks   Status New   PT LONG TERM GOAL #4   Title Decrease FOTO to </= 31% limitation   Time 8   Period Weeks    Status New           Plan - 06/30/14 1536    Clinical Impression Statement Patient presents with LBP for the past year. She has limited trunk and LE ROM with pain with  palpation through the lumbar spine and Rt paraspinals and into Rt piriformis/hip. She has decreased activity level and pain on a daily basis/   Pt will benefit from skilled therapeutic intervention in order to improve on the following deficits Decreased range of motion;Impaired tone;Decreased activity tolerance;Pain;Improper body mechanics;Postural dysfunction;Decreased strength;Decreased mobility   Rehab Potential Good   Clinical Impairments Affecting Rehab Potential Obesity; recent surgery; fibromyalgia   PT Frequency 2x / week   PT Duration 8 weeks   PT Treatment/Interventions ADLs/Self Care Home Management;Cryotherapy;Electrical Stimulation;Moist Heat;Ultrasound;Functional mobility training;Therapeutic activities;Therapeutic exercise;Neuromuscular re-education;Manual techniques;Patient/family education;Passive range of motion;Iontophoresis 49m/ml Dexamethasone   PT Next Visit Plan Review exercises; progress lumbar stabilization; continue spine education  and instruction in proper body mechanics   PT Home Exercise Plan Discussed use of desk for computer work at home to avoid having laptop in lap when she is in soft chair/sofa or propped in bed, etc; initiated core stabilization and piriformis stretch; instructed in myofacial ball release work supine and standing.   Consulted and Agree with Plan of Care Patient        Problem List Patient Active Problem List   Diagnosis Date Noted  . Right lumbar radiculopathy 06/11/2014  . Skin tag 06/11/2014  . Abnormal uterine bleeding 02/13/2014  . Contusion of knee, right 10/10/2013  . Fibromyalgia muscle pain 10/07/2013  . Diabetes mellitus type 2 in obese 10/07/2013  . Anxiety disorder 10/07/2013  . Chronic pelvic pain in female 10/07/2013  . Endometriosis of pelvis  10/07/2013     PNilda Simmer PT, MPH 06/30/2014, 4:10 PM  [facility] [place] [address]Phone: [phone]  Fax:  [phone]   PHYSICAL THERAPY DISCHARGE SUMMARY  Visits from Start of Care: 1  Current functional level related to goals / functional outcomes: Seen for initial evaluation only.   Remaining deficits: unknown   Education / Equipment: HEP  Plan: Patient agrees to discharge.  Patient goals were not met. Patient is being discharged due to not returning since the last visit.  ?????

## 2014-07-03 ENCOUNTER — Encounter: Payer: BLUE CROSS/BLUE SHIELD | Admitting: Rehabilitative and Restorative Service Providers"

## 2014-07-09 ENCOUNTER — Ambulatory Visit (INDEPENDENT_AMBULATORY_CARE_PROVIDER_SITE_OTHER): Payer: BLUE CROSS/BLUE SHIELD | Admitting: Sports Medicine

## 2014-07-09 ENCOUNTER — Encounter: Payer: Self-pay | Admitting: Sports Medicine

## 2014-07-09 VITALS — BP 168/98 | HR 138 | Ht 66.0 in | Wt 277.0 lb

## 2014-07-09 DIAGNOSIS — L918 Other hypertrophic disorders of the skin: Secondary | ICD-10-CM

## 2014-07-09 DIAGNOSIS — IMO0001 Reserved for inherently not codable concepts without codable children: Secondary | ICD-10-CM

## 2014-07-09 DIAGNOSIS — M609 Myositis, unspecified: Secondary | ICD-10-CM

## 2014-07-09 DIAGNOSIS — E669 Obesity, unspecified: Secondary | ICD-10-CM

## 2014-07-09 DIAGNOSIS — M545 Low back pain, unspecified: Secondary | ICD-10-CM

## 2014-07-09 DIAGNOSIS — M79604 Pain in right leg: Secondary | ICD-10-CM

## 2014-07-09 DIAGNOSIS — M791 Myalgia: Secondary | ICD-10-CM

## 2014-07-09 DIAGNOSIS — M797 Fibromyalgia: Secondary | ICD-10-CM | POA: Diagnosis not present

## 2014-07-09 HISTORY — DX: Morbid (severe) obesity due to excess calories: E66.01

## 2014-07-09 MED ORDER — PHENTERMINE HCL 37.5 MG PO TABS: ORAL_TABLET | ORAL | Status: DC

## 2014-07-09 MED ORDER — TOPIRAMATE 50 MG PO TABS: ORAL_TABLET | ORAL | Status: DC

## 2014-07-09 MED ORDER — GABAPENTIN 600 MG PO TABS: ORAL_TABLET | ORAL | Status: DC

## 2014-07-09 NOTE — Assessment & Plan Note (Signed)
Increasing gabapentin, we may also increase her Cymbalta at a future visit.

## 2014-07-09 NOTE — Assessment & Plan Note (Signed)
Cryotherapy 3 on the right side of the face. She has a couple on her left side of the arm and body, and will return for surgical excision.

## 2014-07-09 NOTE — Assessment & Plan Note (Signed)
Adding phentermine, Topamax. Continue Trulicity She can follow this up with her PCP

## 2014-07-09 NOTE — Progress Notes (Signed)
  Subjective:    CC: Follow-up  HPI: Low back pain: Right-sided, both midline and directly localized to the sacral iliac joint with radiation into the buttock and right lateral thigh but with nothing overtly radicular. She has now failed physical therapy, oral medications including gabapentin 300 thrice a day as well as Cymbalta at 60 mg. She is amenable to proceed with advanced imaging interventional treatment.  Skin tags: Right-sided face, as well as under the left arm and chest wall. Amenable to do only the facial skin tags today.  Obesity: Has lost 30 pounds on Trulicity, amenable to start phentermine.  Past medical history, Surgical history, Family history not pertinant except as noted below, Social history, Allergies, and medications have been entered into the medical record, reviewed, and no changes needed.   Review of Systems: No fevers, chills, night sweats, weight loss, chest pain, or shortness of breath.   Objective:    General: Well Developed, well nourished, and in no acute distress.  Neuro: Alert and oriented x3, extra-ocular muscles intact, sensation grossly intact.  HEENT: Normocephalic, atraumatic, pupils equal round reactive to light, neck supple, no masses, no lymphadenopathy, thyroid nonpalpable.  Skin: Warm and dry, no rashes. Several skin tags on the right side of the face, there is one on the left nasolabial fold and 2 on the right side of body including the arm and axilla Cardiac: Regular rate and rhythm, no murmurs rubs or gallops, no lower extremity edema.  Respiratory: Clear to auscultation bilaterally. Not using accessory muscles, speaking in full sentences. Back Exam:  Inspection: Unremarkable  Motion: Flexion 45 deg, Extension 45 deg, Side Bending to 45 deg bilaterally,  Rotation to 45 deg bilaterally  SLR laying: Negative  XSLR laying: Negative  Palpable tenderness: Tender to palpation at the right sacral iliac joint with a positive Patrick's test, there is  also minimal tenderness at the midline, slightly to the right in the lower lumbar spine. FABER: negative. Sensory change: Gross sensation intact to all lumbar and sacral dermatomes.  Reflexes: 2+ at both patellar tendons, 2+ at achilles tendons, Babinski's downgoing.  Strength at foot  Plantar-flexion: 5/5 Dorsi-flexion: 5/5 Eversion: 5/5 Inversion: 5/5  Leg strength  Quad: 5/5 Hamstring: 5/5 Hip flexor: 5/5 Hip abductors: 5/5  Gait unremarkable.  Procedure: Real-time Ultrasound Guided Injection of right sacral iliac joint Device: GE Logiq E  Verbal informed consent obtained.  Time-out conducted.  Noted no overlying erythema, induration, or other signs of local infection.  Skin prepped in a sterile fashion.  Local anesthesia: Topical Ethyl chloride.  With sterile technique and under real time ultrasound guidance:  Spinal needle advanced into the SI joint, taking care to avoid the S1 foramen, as I felt the needle slip into the joint the patient did report concordant pain, I then injected 1 mL kenalog 40, 4 mL lidocaine, again patient experienced concordant pain during injection and afterwards all of her sacroiliac and right-sided thigh pain was resolved. She did still have some right-sided midline pain Completed without difficulty  Advised to call if fevers/chills, erythema, induration, drainage, or persistent bleeding.  Images permanently stored and available for review in the ultrasound unit.  Impression: Technically successful ultrasound guided injection.  Procedure:  Cryodestruction of 3 right-sided facial skin tags Consent obtained and verified. Time-out conducted. Noted no overlying erythema, induration, or other signs of local infection. Completed without difficulty using Cryo-Gun. Advised to call if fevers/chills, erythema, induration, drainage, or persistent bleeding.  Impression and Recommendations:

## 2014-07-09 NOTE — Assessment & Plan Note (Signed)
Radiation to the right leg is not clearly in the distribution of any spinal nerve root, and does not radicular. She does have axial pain or done only at the right sacral iliac joint, I injected her right sacroiliac joint today under ultrasound guidance and all of her right-sided back pain and partial leg pain resolved confirming the SI joint as a pain generator. She does still have some axial midline back pain, she has failed physical therapy, oral medications. We are going to obtain an MRI for further interventional planning, likely in the form of a facet block or an epidural. I'm doubling her gabapentin to 600 mg 3 times a day, in the future we may increase her Cymbalta to 120 mg. Considering widespread and bilateral tenderness to palpation there is certainly element of myofascial pain syndrome as well.

## 2014-07-10 ENCOUNTER — Telehealth: Payer: Self-pay

## 2014-07-10 NOTE — Telephone Encounter (Signed)
I told her that it would fall off in a week or 2, but she should expect some redness, pain, stinging. I also told her it could take a couple of freezing sessions before its completely destroyed.

## 2014-07-10 NOTE — Telephone Encounter (Signed)
Patient called she has skin tag frozen on her face she wants to know what she should expect from the freezing. She was told that it should fall off Please advise patient. Rhonda Cunningham,CMA

## 2014-07-10 NOTE — Telephone Encounter (Signed)
Patient has been informed of the information noted below. Christine Grant,CMA

## 2014-07-15 ENCOUNTER — Ambulatory Visit (INDEPENDENT_AMBULATORY_CARE_PROVIDER_SITE_OTHER): Payer: BLUE CROSS/BLUE SHIELD

## 2014-07-15 DIAGNOSIS — M79604 Pain in right leg: Secondary | ICD-10-CM

## 2014-07-15 DIAGNOSIS — M5137 Other intervertebral disc degeneration, lumbosacral region: Secondary | ICD-10-CM | POA: Diagnosis not present

## 2014-07-15 DIAGNOSIS — M545 Low back pain, unspecified: Secondary | ICD-10-CM

## 2014-07-15 DIAGNOSIS — M479 Spondylosis, unspecified: Secondary | ICD-10-CM

## 2014-07-28 ENCOUNTER — Encounter: Payer: Self-pay | Admitting: Sports Medicine

## 2014-07-28 ENCOUNTER — Ambulatory Visit (INDEPENDENT_AMBULATORY_CARE_PROVIDER_SITE_OTHER): Payer: BLUE CROSS/BLUE SHIELD | Admitting: Sports Medicine

## 2014-07-28 ENCOUNTER — Ambulatory Visit: Payer: BLUE CROSS/BLUE SHIELD | Admitting: Sports Medicine

## 2014-07-28 VITALS — BP 157/85 | HR 79 | Ht 66.0 in | Wt 281.0 lb

## 2014-07-28 DIAGNOSIS — M79604 Pain in right leg: Secondary | ICD-10-CM

## 2014-07-28 DIAGNOSIS — M797 Fibromyalgia: Secondary | ICD-10-CM

## 2014-07-28 DIAGNOSIS — L918 Other hypertrophic disorders of the skin: Secondary | ICD-10-CM

## 2014-07-28 DIAGNOSIS — M545 Low back pain, unspecified: Secondary | ICD-10-CM

## 2014-07-28 NOTE — Assessment & Plan Note (Signed)
MRI is essentially negative the exception of a very small mid lumbar protrusion causing no foraminal stenosis and some mild lower facet arthritis. Further follow-up with pain management, I have asked her to take a copy of her MRI disc for them.

## 2014-07-28 NOTE — Progress Notes (Signed)
  Subjective:    CC: Followup  HPI: Myofascial pain:  Improved significantly with increasing gabapentin and continuing cymbalta.  Back and leg pain:  MRI was essentially negative with the exception of a very small non-foraminal disc protrusion in the mid lumbar spine as well as some mild lower lumbar facet arthritis. She does have follow-up with her pain doctor.  Skin tags: Good response to previous cryotherapy, they are significantly smaller. She still has a lesion on the left side of her arm that she will set up an appointment for excision.  Past medical history, Surgical history, Family history not pertinant except as noted below, Social history, Allergies, and medications have been entered into the medical record, reviewed, and no changes needed.   Review of Systems: No fevers, chills, night sweats, weight loss, chest pain, or shortness of breath.   Objective:    General: Well Developed, well nourished, and in no acute distress.  Neuro: Alert and oriented x3, extra-ocular muscles intact, sensation grossly intact.  HEENT: Normocephalic, atraumatic, pupils equal round reactive to light, neck supple, no masses, no lymphadenopathy, thyroid nonpalpable.  Skin: Warm and dry, no rashes. Cardiac: Regular rate and rhythm, no murmurs rubs or gallops, no lower extremity edema.  Respiratory: Clear to auscultation bilaterally. Not using accessory muscles, speaking in full sentences.  Procedure:  Cryodestruction of 3 right-sided facial skin tags Consent obtained and verified. Time-out conducted. Noted no overlying erythema, induration, or other signs of local infection. Completed without difficulty using Cryo-Gun. Advised to call if fevers/chills, erythema, induration, drainage, or persistent bleeding.  Impression and Recommendations:

## 2014-07-28 NOTE — Assessment & Plan Note (Signed)
Doing extremely well on current dose of Cymbalta and gabapentin, MRI did show some mild lower lumbar facet arthritis, and a single small disc protrusion causing no foraminal stenosis. She does have an appointment with her pain doctor, who will probably take over Cymbalta and gabapentin.

## 2014-07-28 NOTE — Assessment & Plan Note (Signed)
Repeat cryo x3, right face, good improvement but slow. Return in 3 weeks.

## 2014-08-05 ENCOUNTER — Other Ambulatory Visit: Payer: Self-pay | Admitting: Sports Medicine

## 2014-08-07 ENCOUNTER — Other Ambulatory Visit: Payer: Self-pay | Admitting: Sports Medicine

## 2014-08-09 ENCOUNTER — Other Ambulatory Visit: Payer: Self-pay | Admitting: Sports Medicine

## 2014-08-18 ENCOUNTER — Encounter: Payer: Self-pay | Admitting: Sports Medicine

## 2014-08-18 ENCOUNTER — Ambulatory Visit (INDEPENDENT_AMBULATORY_CARE_PROVIDER_SITE_OTHER): Payer: BLUE CROSS/BLUE SHIELD | Admitting: Sports Medicine

## 2014-08-18 VITALS — BP 157/96 | HR 100 | Ht 66.5 in | Wt 283.0 lb

## 2014-08-18 DIAGNOSIS — L918 Other hypertrophic disorders of the skin: Secondary | ICD-10-CM

## 2014-08-18 DIAGNOSIS — L989 Disorder of the skin and subcutaneous tissue, unspecified: Secondary | ICD-10-CM | POA: Diagnosis not present

## 2014-08-18 DIAGNOSIS — L980 Pyogenic granuloma: Secondary | ICD-10-CM | POA: Insufficient documentation

## 2014-08-18 NOTE — Assessment & Plan Note (Signed)
Surgical excision as above. A single 5-0 Ethilon horizontal mattress suture placed. Return in one week.

## 2014-08-18 NOTE — Progress Notes (Signed)
  Subjective:    CC: Follow-up  HPI: Skin tag: Doing extremely well after a couple of sessions of cryotherapy.  Left arm skin lesion: Hyperpigmented, pedunculated. Would like this removed.  Past medical history, Surgical history, Family history not pertinant except as noted below, Social history, Allergies, and medications have been entered into the medical record, reviewed, and no changes needed.   Review of Systems: No fevers, chills, night sweats, weight loss, chest pain, or shortness of breath.   Objective:    General: Well Developed, well nourished, and in no acute distress.  Neuro: Alert and oriented x3, extra-ocular muscles intact, sensation grossly intact.  HEENT: Normocephalic, atraumatic, pupils equal round reactive to light, neck supple, no masses, no lymphadenopathy, thyroid nonpalpable.  Skin: Warm and dry, no rashes. Skin tags on the right side of the face are now flat. Cardiac: Regular rate and rhythm, no murmurs rubs or gallops, no lower extremity edema.  Respiratory: Clear to auscultation bilaterally. Not using accessory muscles, speaking in full sentences.  Procedure:  Excision of left arm 2 cm pedunculated lesion Risks, benefits, and alternatives explained and consent obtained. Time out conducted. Surface prepped with alcohol. 5cc lidocaine with epinephine infiltrated in a field block. Adequate anesthesia ensured. Area prepped and draped in a sterile fashion. Excision performed with: I grasped the lesion, cut the base with a #11 blade, and placed a single 5-0 horizontal mattress Ethilon suture. Hemostasis achieved. Pt stable.  Impression and Recommendations:

## 2014-08-18 NOTE — Assessment & Plan Note (Signed)
Cryotherapy after a couple of sessions has resulted in a fantastic response. No further intervention needed.

## 2014-08-18 NOTE — Addendum Note (Signed)
Addended by: Doree Albee on: 08/18/2014 02:05 PM   Modules accepted: Orders

## 2014-08-25 ENCOUNTER — Ambulatory Visit: Payer: BLUE CROSS/BLUE SHIELD | Admitting: Sports Medicine

## 2014-08-26 ENCOUNTER — Encounter: Payer: Self-pay | Admitting: Sports Medicine

## 2014-08-26 ENCOUNTER — Ambulatory Visit (INDEPENDENT_AMBULATORY_CARE_PROVIDER_SITE_OTHER): Payer: BLUE CROSS/BLUE SHIELD | Admitting: Sports Medicine

## 2014-08-26 VITALS — BP 133/88 | HR 132 | Ht 66.5 in | Wt 283.0 lb

## 2014-08-26 DIAGNOSIS — B079 Viral wart, unspecified: Secondary | ICD-10-CM | POA: Insufficient documentation

## 2014-08-26 DIAGNOSIS — L980 Pyogenic granuloma: Secondary | ICD-10-CM

## 2014-08-26 NOTE — Assessment & Plan Note (Signed)
Cryotherapy of a wart at the left middle finger.

## 2014-08-26 NOTE — Assessment & Plan Note (Signed)
Surgical excision at the last visit, suture removed today.

## 2014-08-26 NOTE — Progress Notes (Signed)
  Subjective:    CC: Follow-up  HPI: Skin lesion: Left-sided, hand, middle finger, would like this frozen.  Skin lesion on arm: Left-sided, upper arm, removed at the last visit, pathology showed a pyogenicum granuloma, here for suture removal.  Past medical history, Surgical history, Family history not pertinant except as noted below, Social history, Allergies, and medications have been entered into the medical record, reviewed, and no changes needed.   Review of Systems: No fevers, chills, night sweats, weight loss, chest pain, or shortness of breath.   Objective:    General: Well Developed, well nourished, and in no acute distress.  Neuro: Alert and oriented x3, extra-ocular muscles intact, sensation grossly intact.  HEENT: Normocephalic, atraumatic, pupils equal round reactive to light, neck supple, no masses, no lymphadenopathy, thyroid nonpalpable.  Skin: Warm and dry, no rashes. Cardiac: Regular rate and rhythm, no murmurs rubs or gallops, no lower extremity edema.  Respiratory: Clear to auscultation bilaterally. Not using accessory muscles, speaking in full sentences. Left hand: There is a small verruca at the eponychium of the middle finger area Left upper arm: Incision is clean, dry, intact, single sibling corrupted suture removed.  Procedure:  Cryodestruction of verruca on the left middle finger Consent obtained and verified. Time-out conducted. Noted no overlying erythema, induration, or other signs of local infection. Completed without difficulty using Cryo-Gun. Advised to call if fevers/chills, erythema, induration, drainage, or persistent bleeding.  Impression and Recommendations:

## 2014-09-03 ENCOUNTER — Other Ambulatory Visit: Payer: Self-pay | Admitting: Sports Medicine

## 2014-09-03 DIAGNOSIS — M797 Fibromyalgia: Secondary | ICD-10-CM | POA: Insufficient documentation

## 2014-09-03 DIAGNOSIS — E119 Type 2 diabetes mellitus without complications: Secondary | ICD-10-CM

## 2014-09-03 DIAGNOSIS — R109 Unspecified abdominal pain: Secondary | ICD-10-CM | POA: Insufficient documentation

## 2014-09-03 DIAGNOSIS — D72829 Elevated white blood cell count, unspecified: Secondary | ICD-10-CM | POA: Insufficient documentation

## 2014-09-03 HISTORY — DX: Type 2 diabetes mellitus without complications: E11.9

## 2014-09-05 ENCOUNTER — Ambulatory Visit (INDEPENDENT_AMBULATORY_CARE_PROVIDER_SITE_OTHER): Payer: BLUE CROSS/BLUE SHIELD | Admitting: Sports Medicine

## 2014-09-05 ENCOUNTER — Encounter: Payer: Self-pay | Admitting: Sports Medicine

## 2014-09-05 VITALS — BP 150/97 | HR 114 | Ht 66.0 in | Wt 272.0 lb

## 2014-09-05 DIAGNOSIS — N10 Acute tubulo-interstitial nephritis: Secondary | ICD-10-CM

## 2014-09-05 NOTE — Assessment & Plan Note (Signed)
Seen in the emergency department, treated appropriately with Keflex, CT scan did show some perinephric stranding, improving significantly, urine culture did grow out pansensitive Escherichia coli. No blood culture was able to be pulled up however we will keep an eye on this. Overall patient is clinically resolved.

## 2014-09-05 NOTE — Progress Notes (Signed)
  Subjective:    CC: emergency department follow-up  HPI: This pleasant 39 year old female was seen in the ER twice over the weekend, she was diagnosed with pyelonephritis, started on Keflex, urine culture ultimately grew pansensitive Escherichia coli, and symptoms have resolved.  Past medical history, Surgical history, Family history not pertinant except as noted below, Social history, Allergies, and medications have been entered into the medical record, reviewed, and no changes needed.   Review of Systems: No fevers, chills, night sweats, weight loss, chest pain, or shortness of breath.   Objective:    General: Well Developed, well nourished, and in no acute distress.  Neuro: Alert and oriented x3, extra-ocular muscles intact, sensation grossly intact.  HEENT: Normocephalic, atraumatic, pupils equal round reactive to light, neck supple, no masses, no lymphadenopathy, thyroid nonpalpable.  Skin: Warm and dry, no rashes. Cardiac: Regular rate and rhythm, no murmurs rubs or gallops, no lower extremity edema.  Respiratory: Clear to auscultation bilaterally. Not using accessory muscles, speaking in full sentences. Abdomen: Soft, nontender, nondistended, normal bowel sounds, no palpable masses, no costovertebral angle pain.  Impression and Recommendations:    I spent 25 minutes with this patient, greater than 50% was face-to-face time counseling regarding the above diagnoses

## 2014-09-08 ENCOUNTER — Telehealth: Payer: Self-pay

## 2014-09-08 MED ORDER — ONDANSETRON 8 MG PO TBDP: 8.0000 mg | ORAL_TABLET | Freq: Three times a day (TID) | ORAL | Status: DC | PRN

## 2014-09-08 NOTE — Telephone Encounter (Signed)
No need, this was due to the pyelonephritis.

## 2014-09-08 NOTE — Telephone Encounter (Signed)
Patient has been informed that Rx was  been sent to her pharmacy. Patient wants to know if she will still need to her WBC rechecked due to the fact that it was high.   Christine Grant,CMA

## 2014-09-08 NOTE — Telephone Encounter (Signed)
Patient called request if she can get a refill  For Zofran that was prescribed in the ER because she is still experiencing nausea. Rhonda Cunningham,CMA

## 2014-09-08 NOTE — Telephone Encounter (Signed)
Rx sent in

## 2014-09-08 NOTE — Telephone Encounter (Signed)
PATIENT INFORMED. Steele Stracener,CMA  

## 2014-10-02 ENCOUNTER — Other Ambulatory Visit: Payer: Self-pay | Admitting: Sports Medicine

## 2014-10-17 ENCOUNTER — Ambulatory Visit (INDEPENDENT_AMBULATORY_CARE_PROVIDER_SITE_OTHER): Payer: BLUE CROSS/BLUE SHIELD | Admitting: Sports Medicine

## 2014-10-17 ENCOUNTER — Encounter: Payer: Self-pay | Admitting: Sports Medicine

## 2014-10-17 VITALS — BP 141/84 | HR 133 | Temp 97.8°F | Ht 66.0 in | Wt 276.0 lb

## 2014-10-17 DIAGNOSIS — E119 Type 2 diabetes mellitus without complications: Secondary | ICD-10-CM | POA: Diagnosis not present

## 2014-10-17 DIAGNOSIS — F411 Generalized anxiety disorder: Secondary | ICD-10-CM

## 2014-10-17 DIAGNOSIS — R309 Painful micturition, unspecified: Secondary | ICD-10-CM | POA: Diagnosis not present

## 2014-10-17 DIAGNOSIS — E669 Obesity, unspecified: Secondary | ICD-10-CM | POA: Diagnosis not present

## 2014-10-17 DIAGNOSIS — E1169 Type 2 diabetes mellitus with other specified complication: Secondary | ICD-10-CM

## 2014-10-17 LAB — POCT URINALYSIS DIPSTICK
Bilirubin, UA: NEGATIVE
Blood, UA: NEGATIVE
Leukocytes, UA: NEGATIVE
Nitrite, UA: NEGATIVE
Protein, UA: NEGATIVE
Spec Grav, UA: 1.02
Urobilinogen, UA: 0.2
pH, UA: 6

## 2014-10-17 MED ORDER — ALPRAZOLAM 1 MG PO TABS: 1.0000 mg | ORAL_TABLET | Freq: Every day | ORAL | Status: DC | PRN

## 2014-10-17 MED ORDER — DAPAGLIFLOZIN PRO-METFORMIN ER 10-1000 MG PO TB24: 1.0000 | ORAL_TABLET | Freq: Every day | ORAL | Status: DC

## 2014-10-17 NOTE — Assessment & Plan Note (Signed)
Refilling xanax

## 2014-10-17 NOTE — Assessment & Plan Note (Signed)
Continue Trulicity, switching to [employer]. Checking routine blood work.

## 2014-10-17 NOTE — Assessment & Plan Note (Signed)
Number followed up with weight loss treatment. She will take about it, and at some point we will get back on the phentermine.

## 2014-10-17 NOTE — Progress Notes (Signed)
  Subjective:    CC: Polyuria  HPI: This is a pleasant 39 year old female with history of diabetes, recently treated for pyelonephritis, currently having increased urination. No pain, no fevers or chills. Symptoms are moderate, persistent.  Past medical history, Surgical history, Family history not pertinant except as noted below, Social history, Allergies, and medications have been entered into the medical record, reviewed, and no changes needed.   Review of Systems: No fevers, chills, night sweats, weight loss, chest pain, or shortness of breath.   Objective:    General: Well Developed, well nourished, and in no acute distress.  Neuro: Alert and oriented x3, extra-ocular muscles intact, sensation grossly intact.  HEENT: Normocephalic, atraumatic, pupils equal round reactive to light, neck supple, no masses, no lymphadenopathy, thyroid nonpalpable.  Skin: Warm and dry, no rashes. Cardiac: Regular rate and rhythm, no murmurs rubs or gallops, no lower extremity edema.  Respiratory: Clear to auscultation bilaterally. Not using accessory muscles, speaking in full sentences. Abdomen: Soft, nontender, nondistended, normal bowel sounds, no palpable masses.  Urinalysis shows excessive glycosuria. No leukocytes, nitrites, or blood.  Impression and Recommendations:    I spent 40 minutes with this patient, greater than 50% was face-to-face time counseling regarding the above diagnoses

## 2014-10-28 ENCOUNTER — Other Ambulatory Visit: Payer: Self-pay | Admitting: Sports Medicine

## 2014-11-18 ENCOUNTER — Other Ambulatory Visit: Payer: Self-pay | Admitting: Sports Medicine

## 2014-11-24 ENCOUNTER — Ambulatory Visit: Payer: BLUE CROSS/BLUE SHIELD | Admitting: Sports Medicine

## 2014-12-01 ENCOUNTER — Ambulatory Visit: Payer: BLUE CROSS/BLUE SHIELD | Admitting: Sports Medicine

## 2014-12-12 ENCOUNTER — Other Ambulatory Visit: Payer: Self-pay | Admitting: Sports Medicine

## 2014-12-23 ENCOUNTER — Other Ambulatory Visit: Payer: Self-pay | Admitting: Sports Medicine

## 2014-12-23 ENCOUNTER — Ambulatory Visit (INDEPENDENT_AMBULATORY_CARE_PROVIDER_SITE_OTHER): Payer: BLUE CROSS/BLUE SHIELD | Admitting: Sports Medicine

## 2014-12-23 VITALS — BP 139/96 | HR 120 | Temp 98.0°F | Resp 18 | Wt 273.2 lb

## 2014-12-23 DIAGNOSIS — E119 Type 2 diabetes mellitus without complications: Secondary | ICD-10-CM

## 2014-12-23 DIAGNOSIS — E781 Pure hyperglyceridemia: Secondary | ICD-10-CM

## 2014-12-23 DIAGNOSIS — E669 Obesity, unspecified: Principal | ICD-10-CM

## 2014-12-23 DIAGNOSIS — E1169 Type 2 diabetes mellitus with other specified complication: Secondary | ICD-10-CM

## 2014-12-23 DIAGNOSIS — F411 Generalized anxiety disorder: Secondary | ICD-10-CM | POA: Diagnosis not present

## 2014-12-23 DIAGNOSIS — M797 Fibromyalgia: Secondary | ICD-10-CM | POA: Diagnosis not present

## 2014-12-23 LAB — CBC
HCT: 47.2 % — ABNORMAL HIGH (ref 36.0–46.0)
Hemoglobin: 15.5 g/dL — ABNORMAL HIGH (ref 12.0–15.0)
MCH: 24 pg — ABNORMAL LOW (ref 26.0–34.0)
MCHC: 32.8 g/dL (ref 30.0–36.0)
MCV: 73.1 fL — ABNORMAL LOW (ref 78.0–100.0)
MPV: 10.4 fL (ref 8.6–12.4)
Platelets: 273 10*3/uL (ref 150–400)
RBC: 6.46 MIL/uL — ABNORMAL HIGH (ref 3.87–5.11)
RDW: 15.2 % (ref 11.5–15.5)
WBC: 8.7 10*3/uL (ref 4.0–10.5)

## 2014-12-23 MED ORDER — TOPIRAMATE 50 MG PO TABS: 50.0000 mg | ORAL_TABLET | Freq: Every day | ORAL | Status: DC

## 2014-12-23 MED ORDER — ALPRAZOLAM 1 MG PO TABS: 1.0000 mg | ORAL_TABLET | Freq: Two times a day (BID) | ORAL | Status: DC | PRN

## 2014-12-23 MED ORDER — PHENTERMINE HCL 37.5 MG PO TABS: ORAL_TABLET | ORAL | Status: DC

## 2014-12-23 MED ORDER — GABAPENTIN 600 MG PO TABS: 1200.0000 mg | ORAL_TABLET | Freq: Three times a day (TID) | ORAL | Status: DC

## 2014-12-23 NOTE — Assessment & Plan Note (Signed)
Refilling gabapentin, stable.

## 2014-12-23 NOTE — Assessment & Plan Note (Signed)
Refilling alprazolam, patient uses 2 pills daily.

## 2014-12-23 NOTE — Progress Notes (Signed)
  Subjective:    CC: Follow-up  HPI: Obesity: Desires to start weight loss treatment  Diabetes: Stable with Trulicity and XigDuo.  Fibromyalgia: Needs a refill of gabapentin, this is effective.  Anxiety: Needs a refill on Xanax.  Past medical history, Surgical history, Family history not pertinant except as noted below, Social history, Allergies, and medications have been entered into the medical record, reviewed, and no changes needed.   Review of Systems: No fevers, chills, night sweats, weight loss, chest pain, or shortness of breath.   Objective:    General: Well Developed, well nourished, and in no acute distress.  Neuro: Alert and oriented x3, extra-ocular muscles intact, sensation grossly intact.  HEENT: Normocephalic, atraumatic, pupils equal round reactive to light, neck supple, no masses, no lymphadenopathy, thyroid nonpalpable.  Skin: Warm and dry, no rashes. Cardiac: Regular rate and rhythm, no murmurs rubs or gallops, no lower extremity edema.  Respiratory: Clear to auscultation bilaterally. Not using accessory muscles, speaking in full sentences.  Impression and Recommendations:

## 2014-12-23 NOTE — Assessment & Plan Note (Addendum)
Starting phentermine, continue Trulicity, return monthly for weight checks and refills.

## 2014-12-23 NOTE — Assessment & Plan Note (Signed)
Continue Trulicity and XigDuo, rechecking A1c today.

## 2014-12-24 DIAGNOSIS — E781 Pure hyperglyceridemia: Secondary | ICD-10-CM

## 2014-12-24 HISTORY — DX: Pure hyperglyceridemia: E78.1

## 2014-12-24 LAB — COMPREHENSIVE METABOLIC PANEL
ALT: 11 U/L (ref 6–29)
AST: 12 U/L (ref 10–30)
Albumin: 4.3 g/dL (ref 3.6–5.1)
Alkaline Phosphatase: 89 U/L (ref 33–115)
BUN: 11 mg/dL (ref 7–25)
CO2: 24 mmol/L (ref 20–31)
Calcium: 9.4 mg/dL (ref 8.6–10.2)
Chloride: 100 mmol/L (ref 98–110)
Creat: 0.66 mg/dL (ref 0.50–1.10)
Glucose, Bld: 200 mg/dL — ABNORMAL HIGH (ref 65–99)
Potassium: 4.5 mmol/L (ref 3.5–5.3)
Sodium: 136 mmol/L (ref 135–146)
Total Bilirubin: 0.5 mg/dL (ref 0.2–1.2)
Total Protein: 6.8 g/dL (ref 6.1–8.1)

## 2014-12-24 LAB — LIPID PANEL
Cholesterol: 215 mg/dL — ABNORMAL HIGH (ref 125–200)
HDL: 32 mg/dL — ABNORMAL LOW (ref >=46)
LDL Cholesterol: 116 mg/dL (ref <130)
Total CHOL/HDL Ratio: 6.7 Ratio — ABNORMAL HIGH (ref <=5.0)
Triglycerides: 334 mg/dL — ABNORMAL HIGH (ref <150)
VLDL: 67 mg/dL — ABNORMAL HIGH (ref <30)

## 2014-12-24 LAB — HEMOGLOBIN A1C
Hgb A1c MFr Bld: 8.5 % — ABNORMAL HIGH (ref <5.7)
Mean Plasma Glucose: 197 mg/dL — ABNORMAL HIGH (ref <117)

## 2014-12-24 LAB — MICROALBUMIN / CREATININE URINE RATIO
Creatinine, Urine: 83 mg/dL (ref 20–320)
Microalb Creat Ratio: 23 mcg/mg creat (ref <30)
Microalb, Ur: 1.9 mg/dL

## 2014-12-24 LAB — VITAMIN D 25 HYDROXY (VIT D DEFICIENCY, FRACTURES): Vit D, 25-Hydroxy: 6 ng/mL — ABNORMAL LOW (ref 30–100)

## 2014-12-24 LAB — TSH: TSH: 1.284 u[IU]/mL (ref 0.350–4.500)

## 2014-12-24 MED ORDER — DULAGLUTIDE 1.5 MG/0.5ML ~~LOC~~ SOAJ: 1.5000 mg | SUBCUTANEOUS | Status: DC

## 2014-12-24 MED ORDER — FENOFIBRATE 160 MG PO TABS: 160.0000 mg | ORAL_TABLET | Freq: Every day | ORAL | Status: DC

## 2014-12-24 MED ORDER — VITAMIN D (ERGOCALCIFEROL) 1.25 MG (50000 UNIT) PO CAPS: 50000.0000 [IU] | ORAL_CAPSULE | ORAL | Status: DC

## 2014-12-24 NOTE — Assessment & Plan Note (Signed)
Hemoglobin A1c is uncontrolled, increasing Trulicity 1.5 mg, continue XigDuo.

## 2014-12-24 NOTE — Addendum Note (Signed)
Addended by: Silverio Decamp on: 12/24/2014 11:36 AM   Modules accepted: Orders

## 2014-12-24 NOTE — Assessment & Plan Note (Signed)
Starting fenofibrate.

## 2015-01-20 ENCOUNTER — Ambulatory Visit: Payer: BLUE CROSS/BLUE SHIELD | Admitting: Sports Medicine

## 2015-01-21 ENCOUNTER — Other Ambulatory Visit: Payer: Self-pay | Admitting: Sports Medicine

## 2015-01-22 ENCOUNTER — Other Ambulatory Visit: Payer: Self-pay | Admitting: Sports Medicine

## 2015-01-22 ENCOUNTER — Ambulatory Visit (INDEPENDENT_AMBULATORY_CARE_PROVIDER_SITE_OTHER): Payer: BLUE CROSS/BLUE SHIELD | Admitting: Sports Medicine

## 2015-01-22 ENCOUNTER — Encounter: Payer: Self-pay | Admitting: Sports Medicine

## 2015-01-22 VITALS — BP 120/70 | HR 118 | Resp 18 | Wt 279.0 lb

## 2015-01-22 DIAGNOSIS — L219 Seborrheic dermatitis, unspecified: Secondary | ICD-10-CM | POA: Insufficient documentation

## 2015-01-22 DIAGNOSIS — E1169 Type 2 diabetes mellitus with other specified complication: Secondary | ICD-10-CM

## 2015-01-22 DIAGNOSIS — M160 Bilateral primary osteoarthritis of hip: Secondary | ICD-10-CM | POA: Diagnosis not present

## 2015-01-22 DIAGNOSIS — M542 Cervicalgia: Secondary | ICD-10-CM

## 2015-01-22 DIAGNOSIS — M5030 Other cervical disc degeneration, unspecified cervical region: Secondary | ICD-10-CM | POA: Insufficient documentation

## 2015-01-22 DIAGNOSIS — E669 Obesity, unspecified: Secondary | ICD-10-CM

## 2015-01-22 DIAGNOSIS — E119 Type 2 diabetes mellitus without complications: Secondary | ICD-10-CM | POA: Diagnosis not present

## 2015-01-22 DIAGNOSIS — L218 Other seborrheic dermatitis: Secondary | ICD-10-CM | POA: Diagnosis not present

## 2015-01-22 HISTORY — DX: Other cervical disc degeneration, unspecified cervical region: M50.30

## 2015-01-22 HISTORY — DX: Bilateral primary osteoarthritis of hip: M16.0

## 2015-01-22 MED ORDER — KETOCONAZOLE 2 % EX CREA: 1.0000 "application " | TOPICAL_CREAM | Freq: Two times a day (BID) | CUTANEOUS | Status: DC

## 2015-01-22 MED ORDER — LORCASERIN HCL 10 MG PO TABS: 1.0000 | ORAL_TABLET | Freq: Two times a day (BID) | ORAL | Status: DC

## 2015-01-22 MED ORDER — ONDANSETRON 8 MG PO TBDP: 8.0000 mg | ORAL_TABLET | Freq: Three times a day (TID) | ORAL | Status: DC | PRN

## 2015-01-22 NOTE — Assessment & Plan Note (Signed)
With right-sided hand pain and weakness, suspect cervical radiculopathy. Patient has had symptoms for several months despite physician directed rehabilitation, has had x-rays at an outside facility, ordering cervical spine MRI, patient agrees to take the results to Dr. Earley Favor for further evaluation and management.

## 2015-01-22 NOTE — Assessment & Plan Note (Signed)
Discontinue phentermine, having chest tightness and tachycardia. Cannot use Contrave, patient is on fentanyl. Adding Belviq with a coupon. Currently on Trulicity and Topamax.

## 2015-01-22 NOTE — Assessment & Plan Note (Signed)
X-rays, weight loss, return if no better in one month for injection.

## 2015-01-22 NOTE — Progress Notes (Signed)
  Subjective:    CC: Follow-up  HPI: Obesity: Gained weight on phentermine.  Chronic pain: Recently started on fentanyl.  Nausea: Intermittent, worse with going up on the dose of Trulicity.  Neck pain: Has been trying to get an MRI with Dr. Earley Favor, would like me to try and obtain the MRI. She agrees to follow-up with Dr. Earley Favor for the results.  Past medical history, Surgical history, Family history not pertinant except as noted below, Social history, Allergies, and medications have been entered into the medical record, reviewed, and no changes needed.   Review of Systems: No fevers, chills, night sweats, weight loss, chest pain, or shortness of breath.   Objective:    General: Well Developed, well nourished, and in no acute distress.  Neuro: Alert and oriented x3, extra-ocular muscles intact, sensation grossly intact.  HEENT: Normocephalic, atraumatic, pupils equal round reactive to light, neck supple, no masses, no lymphadenopathy, thyroid nonpalpable.  Skin: Warm and dry, no rashes. Cardiac: Regular rate and rhythm, no murmurs rubs or gallops, no lower extremity edema.  Respiratory: Clear to auscultation bilaterally. Not using accessory muscles, speaking in full sentences.  Impression and Recommendations:

## 2015-01-22 NOTE — Assessment & Plan Note (Signed)
Diabetic foot exam was done as above.

## 2015-01-22 NOTE — Assessment & Plan Note (Signed)
- 

## 2015-01-27 ENCOUNTER — Encounter: Payer: Self-pay | Admitting: Sports Medicine

## 2015-01-28 ENCOUNTER — Other Ambulatory Visit: Payer: Self-pay

## 2015-01-28 MED ORDER — CYCLOBENZAPRINE HCL 10 MG PO TABS: ORAL_TABLET | ORAL | Status: DC

## 2015-01-30 ENCOUNTER — Telehealth: Payer: Self-pay | Admitting: Sports Medicine

## 2015-01-30 NOTE — Telephone Encounter (Signed)
Received fax for prior authorization on Belviq sent through cover my meds waiting on authorization. - CF

## 2015-02-02 ENCOUNTER — Ambulatory Visit (INDEPENDENT_AMBULATORY_CARE_PROVIDER_SITE_OTHER): Payer: BLUE CROSS/BLUE SHIELD

## 2015-02-02 ENCOUNTER — Ambulatory Visit (INDEPENDENT_AMBULATORY_CARE_PROVIDER_SITE_OTHER): Payer: BLUE CROSS/BLUE SHIELD | Admitting: Sports Medicine

## 2015-02-02 ENCOUNTER — Encounter: Payer: Self-pay | Admitting: Sports Medicine

## 2015-02-02 VITALS — BP 146/94 | HR 105 | Temp 98.4°F | Resp 18 | Wt 274.4 lb

## 2015-02-02 DIAGNOSIS — M4802 Spinal stenosis, cervical region: Secondary | ICD-10-CM | POA: Diagnosis not present

## 2015-02-02 DIAGNOSIS — I152 Hypertension secondary to endocrine disorders: Secondary | ICD-10-CM

## 2015-02-02 DIAGNOSIS — H8113 Benign paroxysmal vertigo, bilateral: Secondary | ICD-10-CM | POA: Diagnosis not present

## 2015-02-02 DIAGNOSIS — I10 Essential (primary) hypertension: Secondary | ICD-10-CM

## 2015-02-02 DIAGNOSIS — L218 Other seborrheic dermatitis: Secondary | ICD-10-CM | POA: Diagnosis not present

## 2015-02-02 DIAGNOSIS — L219 Seborrheic dermatitis, unspecified: Secondary | ICD-10-CM

## 2015-02-02 DIAGNOSIS — M160 Bilateral primary osteoarthritis of hip: Secondary | ICD-10-CM | POA: Diagnosis not present

## 2015-02-02 DIAGNOSIS — H8110 Benign paroxysmal vertigo, unspecified ear: Secondary | ICD-10-CM | POA: Insufficient documentation

## 2015-02-02 DIAGNOSIS — M5030 Other cervical disc degeneration, unspecified cervical region: Secondary | ICD-10-CM

## 2015-02-02 DIAGNOSIS — M4602 Spinal enthesopathy, cervical region: Secondary | ICD-10-CM

## 2015-02-02 DIAGNOSIS — E1159 Type 2 diabetes mellitus with other circulatory complications: Secondary | ICD-10-CM | POA: Insufficient documentation

## 2015-02-02 DIAGNOSIS — M542 Cervicalgia: Secondary | ICD-10-CM

## 2015-02-02 HISTORY — DX: Hypertension secondary to endocrine disorders: I15.2

## 2015-02-02 MED ORDER — LISINOPRIL-HYDROCHLOROTHIAZIDE 10-12.5 MG PO TABS: 1.0000 | ORAL_TABLET | Freq: Every day | ORAL | Status: DC

## 2015-02-02 MED ORDER — SCOPOLAMINE 1 MG/3DAYS TD PT72: 1.0000 | MEDICATED_PATCH | TRANSDERMAL | Status: DC

## 2015-02-02 NOTE — Progress Notes (Signed)
  Subjective:    CC: Multiple issues  HPI: Here loss: No separate dermatitis, having a bit of increased hair loss in the hairbrush, normal TSH recently.  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  Dizziness: Occurs randomly, can be sitting and suddenly gets a sensation of horizontal spinning. Worse with head movement at that time.  Elevated blood pressure: Has been elevated on multiple occasions, agreeable to start blood pressure medication  Past medical history, Surgical history, Family history not pertinant except as noted below, Social history, Allergies, and medications have been entered into the medical record, reviewed, and no changes needed.   Review of Systems: No fevers, chills, night sweats, weight loss, chest pain, or shortness of breath.   Objective:    General: Well Developed, well nourished, and in no acute distress.  Neuro: Alert and oriented x3, extra-ocular muscles intact, sensation grossly intact.  HEENT: Normocephalic, atraumatic, pupils equal round reactive to light, neck supple, no masses, no lymphadenopathy, thyroid nonpalpable.  Skin: Warm and dry, no rashes. Cardiac: Regular rate and rhythm, no murmurs rubs or gallops, no lower extremity edema.  Respiratory: Clear to auscultation bilaterally. Not using accessory muscles, speaking in full sentences.  Impression and Recommendations:   I spent 25 minutes with this patient, greater than 50% was face-to-face time counseling regarding the above diagnoses

## 2015-02-02 NOTE — Assessment & Plan Note (Signed)
Further management with Dr. Earley Favor with [facility]

## 2015-02-02 NOTE — Assessment & Plan Note (Signed)
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 Reassured her that her hair will come back.

## 2015-02-02 NOTE — Patient Instructions (Signed)
Benign Positional Vertigo Vertigo is the feeling that you or your surroundings are moving when they are not. Benign positional vertigo is the most common form of vertigo. The cause of this condition is not serious (is benign). This condition is triggered by certain movements and positions (is positional). This condition can be dangerous if it occurs while you are doing something that could endanger you or others, such as driving.  CAUSES In many cases, the cause of this condition is not known. It may be caused by a disturbance in an area of the inner ear that helps your brain to sense movement and balance. This disturbance can be caused by a viral infection (labyrinthitis), head injury, or repetitive motion. RISK FACTORS This condition is more likely to develop in:  Women.  People who are 50 years of age or older. SYMPTOMS Symptoms of this condition usually happen when you move your head or your eyes in different directions. Symptoms may start suddenly, and they usually last for less than a minute. Symptoms may include:  Loss of balance and falling.  Feeling like you are spinning or moving.  Feeling like your surroundings are spinning or moving.  Nausea and vomiting.  Blurred vision.  Dizziness.  Involuntary eye movement (nystagmus). Symptoms can be mild and cause only slight annoyance, or they can be severe and interfere with daily life. Episodes of benign positional vertigo may return (recur) over time, and they may be triggered by certain movements. Symptoms may improve over time. DIAGNOSIS This condition is usually diagnosed by medical history and a physical exam of the head, neck, and ears. You may be referred to a health care provider who specializes in ear, nose, and throat (ENT) problems (otolaryngologist) or a provider who specializes in disorders of the nervous system (neurologist). You may have additional testing, including:  MRI.  A CT scan.  Eye movement tests. Your  health care provider may ask you to change positions quickly while he or she watches you for symptoms of benign positional vertigo, such as nystagmus. Eye movement may be tested with an electronystagmogram (ENG), caloric stimulation, the Dix-Hallpike test, or the roll test.  An electroencephalogram (EEG). This records electrical activity in your brain.  Hearing tests. TREATMENT Usually, your health care provider will treat this by moving your head in specific positions to adjust your inner ear back to normal. Surgery may be needed in severe cases, but this is rare. In some cases, benign positional vertigo may resolve on its own in 2-4 weeks. HOME CARE INSTRUCTIONS Safety  Move slowly.Avoid sudden body or head movements.  Avoid driving.  Avoid operating heavy machinery.  Avoid doing any tasks that would be dangerous to you or others if a vertigo episode would occur.  If you have trouble walking or keeping your balance, try using a cane for stability. If you feel dizzy or unstable, sit down right away.  Return to your normal activities as told by your health care provider. Ask your health care provider what activities are safe for you. General Instructions  Take over-the-counter and prescription medicines only as told by your health care provider.  Avoid certain positions or movements as told by your health care provider.  Drink enough fluid to keep your urine clear or pale yellow.  Keep all follow-up visits as told by your health care provider. This is important. SEEK MEDICAL CARE IF:  You have a fever.  Your condition gets worse or you develop new symptoms.  Your family or friends   notice any behavioral changes.  Your nausea or vomiting gets worse.  You have numbness or a "pins and needles" sensation. SEEK IMMEDIATE MEDICAL CARE IF:  You have difficulty speaking or moving.  You are always dizzy.  You faint.  You develop severe headaches.  You have weakness in your  legs or arms.  You have changes in your hearing or vision.  You develop a stiff neck.  You develop sensitivity to light.   This information is not intended to replace advice given to you by your health care provider. Make sure you discuss any questions you have with your health care provider.   Document Released: 10/04/2005 Document Revised: 09/17/2014 Document Reviewed: 04/21/2014 Elsevier Interactive Patient Education 2016 [employer]  

## 2015-02-02 NOTE — Assessment & Plan Note (Signed)
Scopolamine patches, vestibular rehabilitation.

## 2015-02-02 NOTE — Assessment & Plan Note (Signed)
Pain is mild at this point, does not desire interventional treatment today.

## 2015-02-02 NOTE — Assessment & Plan Note (Signed)
Starting low-dose lisinopril/ HCTZ

## 2015-02-03 ENCOUNTER — Ambulatory Visit: Payer: Self-pay | Admitting: Sports Medicine

## 2015-02-03 NOTE — Telephone Encounter (Signed)
Received fax from [facility] and they approved coverage on Belviq from 01/30/2015 - 07/28/2015. Reference [facility] pharmacy has been notified. - CF

## 2015-02-19 ENCOUNTER — Ambulatory Visit: Payer: BLUE CROSS/BLUE SHIELD | Admitting: Sports Medicine

## 2015-02-22 ENCOUNTER — Other Ambulatory Visit: Payer: Self-pay | Admitting: Sports Medicine

## 2015-03-02 ENCOUNTER — Ambulatory Visit: Payer: BLUE CROSS/BLUE SHIELD | Admitting: Sports Medicine

## 2015-03-06 ENCOUNTER — Ambulatory Visit: Payer: BLUE CROSS/BLUE SHIELD | Admitting: Sports Medicine

## 2015-03-19 ENCOUNTER — Ambulatory Visit: Payer: BLUE CROSS/BLUE SHIELD | Admitting: Sports Medicine

## 2015-03-23 ENCOUNTER — Other Ambulatory Visit: Payer: Self-pay | Admitting: Sports Medicine

## 2015-03-24 ENCOUNTER — Ambulatory Visit: Payer: BLUE CROSS/BLUE SHIELD | Admitting: Sports Medicine

## 2015-03-25 ENCOUNTER — Ambulatory Visit (INDEPENDENT_AMBULATORY_CARE_PROVIDER_SITE_OTHER): Payer: BLUE CROSS/BLUE SHIELD | Admitting: Sports Medicine

## 2015-03-25 ENCOUNTER — Encounter: Payer: Self-pay | Admitting: Sports Medicine

## 2015-03-25 VITALS — BP 119/81 | HR 68 | Resp 16 | Wt 265.6 lb

## 2015-03-25 DIAGNOSIS — H8113 Benign paroxysmal vertigo, bilateral: Secondary | ICD-10-CM

## 2015-03-25 DIAGNOSIS — M160 Bilateral primary osteoarthritis of hip: Secondary | ICD-10-CM

## 2015-03-25 DIAGNOSIS — I10 Essential (primary) hypertension: Secondary | ICD-10-CM | POA: Diagnosis not present

## 2015-03-25 DIAGNOSIS — M5030 Other cervical disc degeneration, unspecified cervical region: Secondary | ICD-10-CM | POA: Diagnosis not present

## 2015-03-25 DIAGNOSIS — E669 Obesity, unspecified: Secondary | ICD-10-CM

## 2015-03-25 MED ORDER — TRAMADOL HCL 50 MG PO TABS: ORAL_TABLET | ORAL | Status: DC

## 2015-03-25 MED ORDER — AMITRIPTYLINE HCL 50 MG PO TABS: ORAL_TABLET | ORAL | Status: DC

## 2015-03-25 NOTE — Assessment & Plan Note (Signed)
Resolved with lisinopril/HCTZ

## 2015-03-25 NOTE — Assessment & Plan Note (Signed)
10 pound weight loss after the first month on Belviq

## 2015-03-25 NOTE — Progress Notes (Signed)
  Subjective:    CC: Follow-up  HPI: Obesity: 10 pound weight loss after the first month on Belviq.  Hip osteoarthritis: Resolved with naproxen.  Benign paroxysmal positional vertigo: Resolved with vestibular rehabilitation and scopolamine patches.  Cervical spondylosis: Has been discharged by Dr. Earley Favor, is off of all narcotics, feeling okay with gabapentin max dose, naproxen, Tylenol. Agreeable to try and be tryptamine and tramadol but understands we will not be going to any schedule 2 narcotics.  Hypertension: Resolved  Past medical history, Surgical history, Family history not pertinant except as noted below, Social history, Allergies, and medications have been entered into the medical record, reviewed, and no changes needed.   Review of Systems: No fevers, chills, night sweats, weight loss, chest pain, or shortness of breath.   Objective:    General: Well Developed, well nourished, and in no acute distress.  Neuro: Alert and oriented x3, extra-ocular muscles intact, sensation grossly intact.  HEENT: Normocephalic, atraumatic, pupils equal round reactive to light, neck supple, no masses, no lymphadenopathy, thyroid nonpalpable.  Skin: Warm and dry, no rashes. Cardiac: Regular rate and rhythm, no murmurs rubs or gallops, no lower extremity edema.  Respiratory: Clear to auscultation bilaterally. Not using accessory muscles, speaking in full sentences.  Impression and Recommendations:

## 2015-03-25 NOTE — Assessment & Plan Note (Signed)
Controlled with naproxen

## 2015-03-25 NOTE — Assessment & Plan Note (Signed)
Has been discharged by Dr. Earley Favor. Is off of all narcotics. Continue gabapentin max dose, naproxen, Tylenol. Adding a bit of tramadol as well as amitriptyline at bedtime.

## 2015-03-25 NOTE — Assessment & Plan Note (Signed)
Resolved with scopolamine patches and vestibular rehabilitation

## 2015-04-16 ENCOUNTER — Other Ambulatory Visit: Payer: Self-pay | Admitting: Sports Medicine

## 2015-04-30 ENCOUNTER — Encounter: Payer: Self-pay | Admitting: Family Medicine

## 2015-04-30 ENCOUNTER — Ambulatory Visit (INDEPENDENT_AMBULATORY_CARE_PROVIDER_SITE_OTHER): Payer: BLUE CROSS/BLUE SHIELD | Admitting: Family Medicine

## 2015-04-30 VITALS — BP 147/91 | HR 140 | Temp 97.6°F | Wt 271.0 lb

## 2015-04-30 DIAGNOSIS — M545 Low back pain, unspecified: Secondary | ICD-10-CM | POA: Insufficient documentation

## 2015-04-30 DIAGNOSIS — R000 Tachycardia, unspecified: Secondary | ICD-10-CM

## 2015-04-30 DIAGNOSIS — R112 Nausea with vomiting, unspecified: Secondary | ICD-10-CM | POA: Insufficient documentation

## 2015-04-30 NOTE — Progress Notes (Signed)
Christine Grant is a 40 y.o. female who presents to [facility] today for cough vomiting lightheadedness dizziness and thoracic left-sided back pain. Patient notes fevers and chills. Patient complained of worsening back pain over the last few days. She was recently seen in the emergency department where labs are relatively normal. She was thought to have myofascial strain was given Toradol and Valium which helps some. Her symptoms have been slowly worsening. Additionally she notes a severe headache. She describes this as the worst headache of her life.   Past Medical History  Diagnosis Date  . Anxiety   . Depression   . Abnormal uterine bleeding   . Type 2 diabetes mellitus ([place])   . Endometriosis of pelvis   . Sinus tachycardia ([place])   . Chronic pelvic pain in female   . History of ketoacidosis     01-15-2014  . PONV (postoperative nausea and vomiting)   . Wears contact lenses   . Arthritis     knees  . Fibromyalgia    Past Surgical History  Procedure Laterality Date  . Cesarean section  2002,  2005,  2008  . Breast reduction surgery  age 79  . Mandible surgery  age 36    Correct overbite  . Dilation and curettage of uterus N/A 02/13/2014    Procedure: DILATATION AND CURETTAGE;  Surgeon: Everitt Amber, MD;  Location: [facility];  Service: Gynecology;  Laterality: N/A;   Social History  Substance Use Topics  . Smoking status: Never Smoker   . Smokeless tobacco: Never Used  . Alcohol Use: No   family history includes Hyperlipidemia in her mother; Hypertension in her mother. She was adopted.  ROS as above Medications: Current Outpatient Prescriptions  Medication Sig Dispense Refill  . ALPRAZolam (XANAX) 1 MG tablet TAKE 1 TABLET BY MOUTH TWICE A DAY AS NEEDED FOR ANXIETY. 60 tablet 0  . amitriptyline (ELAVIL) 50 MG tablet One half tab PO qHS for a week, then  one tab PO qHS. 90 tablet 3  . Dapagliflozin-Metformin HCl ER (XIGDUO XR) 10-998 MG TB24 Take 1 tablet by mouth daily. 30 tablet 11  . Dulaglutide (TRULICITY) 1.5 0000000 SOPN Inject 1.5 mg into the skin once a week. 4 pen 11  . DULoxetine (CYMBALTA) 60 MG capsule Take 60 mg by mouth every morning.    . fenofibrate 160 MG tablet Take 1 tablet (160 mg total) by mouth daily. 90 tablet 3  . gabapentin (NEURONTIN) 600 MG tablet Take 2 tablets (1,200 mg total) by mouth 3 (three) times daily. 180 tablet 3  . lisinopril-hydrochlorothiazide (PRINZIDE,ZESTORETIC) 10-12.5 MG tablet Take 1 tablet by mouth daily. 30 tablet 3  . Lorcaserin HCl 10 MG TABS Take 1 tablet by mouth 2 (two) times daily. 60 tablet 1  . topiramate (TOPAMAX) 50 MG tablet Take 1 tablet (50 mg total) by mouth daily. 30 tablet 11  . traMADol (ULTRAM) 50 MG tablet TAKE 1 OR 2 TABLETS BY MOUTH EVERY EIGHT HOURS MAX OF 6 TABS PER DAY 90 tablet 0   No current facility-administered medications for this visit.   No Known Allergies   Exam:  BP 147/91 mmHg  Pulse 140  Temp(Src) 97.6 F (36.4 C) (Oral)  Wt 271 lb (122.925 kg)  SpO2 98%  LMP 01/14/2014  Orthostatic VS for the past 24 hrs:  BP- Lying Pulse- Lying BP- Sitting Pulse- Sitting BP- Standing at 0 minutes Pulse- Standing at 0  minutes  04/30/15 1504 136/76 mmHg 150 157/90 mmHg 147 (!) 138/94 mmHg 156      Gen: Ill appearing HEENT: EOMI,  MMM Lungs: Normal work of breathing. Crackles left lower lung Heart: Tachycardia no MRG Abd: NABS, Soft. Nondistended, Nontender Exts: Brisk capillary refill, warm and well perfused.   Twelve-lead EKG shows sinus tachycardia at 139 bpm. No ST segment elevation or depression. No Q waves.   No results found for this or any previous visit (from the past 24 hour(s)). No results found.   40 year old woman with tachycardia orthostatic vital signs back pain and crackles in her left lower lung. This is concerning for sepsis due to  pneumonia. Additional differential diagnosis could include pulmonary embolism or other serious life-threatening possibilities. Additionally she has severe headache and may have meningitis.  Plan for immediate transfer to the emergency department for evaluation management. Patient elects for transport via private vehicle. Her husband will drive.

## 2015-05-01 ENCOUNTER — Ambulatory Visit: Payer: BLUE CROSS/BLUE SHIELD | Admitting: Sports Medicine

## 2015-05-01 DIAGNOSIS — N390 Urinary tract infection, site not specified: Secondary | ICD-10-CM | POA: Insufficient documentation

## 2015-05-02 ENCOUNTER — Other Ambulatory Visit: Payer: Self-pay | Admitting: Sports Medicine

## 2015-05-06 ENCOUNTER — Other Ambulatory Visit: Payer: Self-pay

## 2015-05-06 DIAGNOSIS — I10 Essential (primary) hypertension: Secondary | ICD-10-CM

## 2015-05-06 MED ORDER — LISINOPRIL-HYDROCHLOROTHIAZIDE 10-12.5 MG PO TABS: 1.0000 | ORAL_TABLET | Freq: Every day | ORAL | Status: DC

## 2015-05-11 ENCOUNTER — Other Ambulatory Visit: Payer: Self-pay | Admitting: Sports Medicine

## 2015-05-25 ENCOUNTER — Emergency Department (HOSPITAL_BASED_OUTPATIENT_CLINIC_OR_DEPARTMENT_OTHER)
Admission: EM | Admit: 2015-05-25 | Discharge: 2015-05-26 | Disposition: A | Discharge status: Home / Self Care | Payer: BLUE CROSS/BLUE SHIELD | Attending: Emergency Medicine | Admitting: Emergency Medicine

## 2015-05-25 ENCOUNTER — Emergency Department (HOSPITAL_BASED_OUTPATIENT_CLINIC_OR_DEPARTMENT_OTHER): Payer: BLUE CROSS/BLUE SHIELD

## 2015-05-25 ENCOUNTER — Encounter (HOSPITAL_BASED_OUTPATIENT_CLINIC_OR_DEPARTMENT_OTHER): Payer: Self-pay | Admitting: *Deleted

## 2015-05-25 DIAGNOSIS — Z7984 Long term (current) use of oral hypoglycemic drugs: Secondary | ICD-10-CM | POA: Diagnosis not present

## 2015-05-25 DIAGNOSIS — R000 Tachycardia, unspecified: Secondary | ICD-10-CM | POA: Insufficient documentation

## 2015-05-25 DIAGNOSIS — F329 Major depressive disorder, single episode, unspecified: Secondary | ICD-10-CM | POA: Insufficient documentation

## 2015-05-25 DIAGNOSIS — R51 Headache: Secondary | ICD-10-CM | POA: Diagnosis not present

## 2015-05-25 DIAGNOSIS — R519 Headache, unspecified: Secondary | ICD-10-CM

## 2015-05-25 DIAGNOSIS — M542 Cervicalgia: Secondary | ICD-10-CM | POA: Diagnosis not present

## 2015-05-25 DIAGNOSIS — E119 Type 2 diabetes mellitus without complications: Secondary | ICD-10-CM | POA: Diagnosis not present

## 2015-05-25 DIAGNOSIS — H539 Unspecified visual disturbance: Secondary | ICD-10-CM | POA: Diagnosis not present

## 2015-05-25 DIAGNOSIS — R0602 Shortness of breath: Secondary | ICD-10-CM | POA: Insufficient documentation

## 2015-05-25 DIAGNOSIS — R110 Nausea: Secondary | ICD-10-CM | POA: Insufficient documentation

## 2015-05-25 LAB — CBC WITH DIFFERENTIAL/PLATELET
Basophils Absolute: 0 10*3/uL (ref 0.0–0.1)
Basophils Relative: 0 %
Eosinophils Absolute: 0.1 10*3/uL (ref 0.0–0.7)
Eosinophils Relative: 1 %
HCT: 38.2 % (ref 36.0–46.0)
Hemoglobin: 13.1 g/dL (ref 12.0–15.0)
Lymphocytes Relative: 26 %
Lymphs Abs: 1.8 10*3/uL (ref 0.7–4.0)
MCH: 26.3 pg (ref 26.0–34.0)
MCHC: 34.3 g/dL (ref 30.0–36.0)
MCV: 76.6 fL — ABNORMAL LOW (ref 78.0–100.0)
Monocytes Absolute: 0.5 10*3/uL (ref 0.1–1.0)
Monocytes Relative: 7 %
Neutro Abs: 4.5 10*3/uL (ref 1.7–7.7)
Neutrophils Relative %: 66 %
Platelets: 217 10*3/uL (ref 150–400)
RBC: 4.99 MIL/uL (ref 3.87–5.11)
RDW: 13.5 % (ref 11.5–15.5)
WBC: 6.9 10*3/uL (ref 4.0–10.5)

## 2015-05-25 LAB — BASIC METABOLIC PANEL
Anion gap: 7 (ref 5–15)
BUN: 7 mg/dL (ref 6–20)
CO2: 26 mmol/L (ref 22–32)
Calcium: 9.2 mg/dL (ref 8.9–10.3)
Chloride: 104 mmol/L (ref 101–111)
Creatinine, Ser: 0.59 mg/dL (ref 0.44–1.00)
GFR calc Af Amer: >60 mL/min (ref >60)
GFR calc non Af Amer: >60 mL/min (ref >60)
Glucose, Bld: 264 mg/dL — ABNORMAL HIGH (ref 65–99)
Potassium: 3.9 mmol/L (ref 3.5–5.1)
Sodium: 137 mmol/L (ref 135–145)

## 2015-05-25 LAB — TROPONIN I: Troponin I: <0.03 ng/mL (ref <0.031)

## 2015-05-25 LAB — D-DIMER, QUANTITATIVE: D-Dimer, Quant: 0.33 ug/mL-FEU (ref 0.00–0.50)

## 2015-05-25 MED ORDER — SODIUM CHLORIDE 0.9 % IV BOLUS (SEPSIS)
1000.0000 mL | Freq: Once | INTRAVENOUS | Status: AC
  Administered 2015-05-25: 1000 mL via INTRAVENOUS

## 2015-05-25 MED ORDER — METOCLOPRAMIDE HCL 5 MG/ML IJ SOLN
10.0000 mg | Freq: Once | INTRAMUSCULAR | Status: AC
  Administered 2015-05-25: 10 mg via INTRAVENOUS
  Filled 2015-05-25: qty 2

## 2015-05-25 MED ORDER — IOPAMIDOL (ISOVUE-370) INJECTION 76%
100.0000 mL | Freq: Once | INTRAVENOUS | Status: AC | PRN
  Administered 2015-05-25: 100 mL via INTRAVENOUS

## 2015-05-25 MED ORDER — DIPHENHYDRAMINE HCL 50 MG/ML IJ SOLN
25.0000 mg | Freq: Once | INTRAMUSCULAR | Status: AC
  Administered 2015-05-25: 25 mg via INTRAVENOUS
  Filled 2015-05-25: qty 1

## 2015-05-25 MED ORDER — KETOROLAC TROMETHAMINE 30 MG/ML IJ SOLN
30.0000 mg | Freq: Once | INTRAMUSCULAR | Status: AC
  Administered 2015-05-25: 30 mg via INTRAVENOUS
  Filled 2015-05-25: qty 1

## 2015-05-25 NOTE — ED Notes (Signed)
MD at bedside. 

## 2015-05-25 NOTE — ED Notes (Signed)
Pt c/o " migraine " x 3 days also c/o SOB and dizziness

## 2015-05-25 NOTE — ED Provider Notes (Signed)
CSN: [id number]     Arrival date & time 05/25/15  1937 History  By signing my name below, I, [facility], attest that this documentation has been prepared under the direction and in the presence of Sherwood Gambler, MD. Electronically Signed: Virgel Bouquet, ED Scribe. 05/26/2015. 12:36 AM.   Chief Complaint  Patient presents with  . Migraine   The history is provided by the patient and the spouse. No language interpreter was used.  HPI Comments: Christine Grant is a 40 y.o. female with an hx of anxiety and DM who presents to the Emergency Department complaining of constant, moderate HA onset 3 days ago, suddenly worse in the past 4 hours. She notes that the HA began in her neck went from 3/10 in severity to an 9/10 today. Pt states that she has been stressed and attended an outdoor event 2 days ago with a mild HA that began in the back of her neck that suddenly worsened in severity 4 hours ago. She reports dizziness that presents as room-spinning with head movement, lightheadedness, gradually improving blurred vision, left-sided neck pain, difficulty turning her neck to the left secondary to pain, nausea, and SOB. Husband states that the pt has had multiple episodes today of decreased concentration where she would stop talking mid-sentence and be unable to recall the conversation topic. She has an hx of migraines, most recently 2 years ago. Denies emesis, CP, weakness, numbness, slurred speech, speaking incorrect words or any other symptoms.  Past Medical History  Diagnosis Date  . Anxiety   . Depression   . Abnormal uterine bleeding   . Type 2 diabetes mellitus ([place])   . Endometriosis of pelvis   . Sinus tachycardia ([place])   . Chronic pelvic pain in female   . History of ketoacidosis     01-15-2014  . PONV (postoperative nausea and vomiting)   . Wears contact lenses   . Arthritis     knees  . Fibromyalgia    Past Surgical History  Procedure Laterality Date  . Cesarean section   2002,  2005,  2008  . Breast reduction surgery  age 32  . Mandible surgery  age 57    Correct overbite  . Dilation and curettage of uterus N/A 02/13/2014    Procedure: DILATATION AND CURETTAGE;  Surgeon: Everitt Amber, MD;  Location: [facility];  Service: Gynecology;  Laterality: N/A;   Family History  Problem Relation Age of Onset  . Adopted: Yes  . Hyperlipidemia Mother   . Hypertension Mother    Social History  Substance Use Topics  . Smoking status: Never Smoker   . Smokeless tobacco: Never Used  . Alcohol Use: No   OB History    Gravida Para Term Preterm AB TAB SAB Ectopic Multiple Living   4 3 3       3      Review of Systems  Eyes: Positive for visual disturbance.  Respiratory: Positive for shortness of breath.   Cardiovascular: Negative for chest pain.  Gastrointestinal: Positive for nausea. Negative for vomiting.  Musculoskeletal: Positive for neck pain.  Neurological: Positive for dizziness, light-headedness and headaches. Negative for speech difficulty, weakness and numbness.  Psychiatric/Behavioral: Positive for decreased concentration.  All other systems reviewed and are negative.   Allergies  Review of patient's allergies indicates no known allergies.  Home Medications   Prior to Admission medications   Medication Sig Start Date End Date Taking? Authorizing Provider  ALPRAZolam Duanne Moron) 1 MG tablet TAKE 1  TABLET BY MOUTH TWICE A DAY AS NEEDED FOR ANXIETY 05/11/15   Silverio Decamp, MD  amitriptyline (ELAVIL) 50 MG tablet One half tab PO qHS for a week, then one tab PO qHS. 03/25/15   Silverio Decamp, MD  cyclobenzaprine (FLEXERIL) 10 MG tablet ONE HALF TAB AT BEDTIME, THEN INCREASE GRADUALLY TO ONE TAB THREE TIMES A DAY. 05/11/15   Silverio Decamp, MD  Dapagliflozin-Metformin HCl ER (XIGDUO XR) 10-998 MG TB24 Take 1 tablet by mouth daily. 10/17/14   Silverio Decamp, MD  Dulaglutide (TRULICITY) 1.5 0000000 SOPN Inject 1.5 mg  into the skin once a week. 12/24/14   Silverio Decamp, MD  DULoxetine (CYMBALTA) 60 MG capsule Take 60 mg by mouth every morning.    Historical Provider, MD  fenofibrate 160 MG tablet Take 1 tablet (160 mg total) by mouth daily. 12/24/14   Silverio Decamp, MD  gabapentin (NEURONTIN) 600 MG tablet TAKE 2 TABLETS (1,200 MG TOTAL) BY MOUTH 3 (THREE) TIMES DAILY. 05/03/15   Silverio Decamp, MD  lisinopril-hydrochlorothiazide (PRINZIDE,ZESTORETIC) 10-12.5 MG tablet Take 1 tablet by mouth daily. 05/06/15   Silverio Decamp, MD  Lorcaserin HCl 10 MG TABS Take 1 tablet by mouth 2 (two) times daily. 01/22/15   Silverio Decamp, MD  topiramate (TOPAMAX) 50 MG tablet Take 1 tablet (50 mg total) by mouth daily. 12/23/14   Silverio Decamp, MD  traMADol (ULTRAM) 50 MG tablet TAKE 1-2 TABLETS BY MOUTH EVERY 8 HOURS MAX OF 6 TABLETS PER DAY 05/11/15   Silverio Decamp, MD   BP 127/74 mmHg  Pulse 112  Temp(Src) 98.6 F (37 C)  Resp 16  Ht 5\' 6"  (1.676 m)  Wt 275 lb (124.739 kg)  BMI 44.41 kg/m2  SpO2 100%  LMP 01/14/2014 Physical Exam  Constitutional: She is oriented to person, place, and time. She appears well-developed and well-nourished.  HENT:  Head: Normocephalic and atraumatic.  Right Ear: External ear normal.  Left Ear: External ear normal.  Nose: Nose normal.  Eyes: EOM are normal. Pupils are equal, round, and reactive to light. Right eye exhibits no discharge. Left eye exhibits no discharge.  Neck: Neck supple. Muscular tenderness present.    Cardiovascular: Regular rhythm and normal heart sounds.  Tachycardia present.   Pulmonary/Chest: Effort normal and breath sounds normal.  Abdominal: Soft. There is no tenderness.  Neurological: She is alert and oriented to person, place, and time.  CN 2-12 grossly intact. 5/5 strength in all four extremities. Normal finger-to-nose. Normal gait.  Skin: Skin is warm and dry.  Nursing note and vitals reviewed.   ED  Course  Procedures   DIAGNOSTIC STUDIES: Oxygen Saturation is 100% on RA, normal by my interpretation.    COORDINATION OF CARE: 9:05 PM Will order IV fluids, head CT, chest x-ray, Benadryl, Reglan, and labs. Discussed treatment plan with pt at bedside and pt agreed to plan.   Labs Review Labs Reviewed  CBC WITH DIFFERENTIAL/PLATELET - Abnormal; Notable for the following:    MCV 76.6 (*)    All other components within normal limits  BASIC METABOLIC PANEL - Abnormal; Notable for the following:    Glucose, Bld 264 (*)    All other components within normal limits  TROPONIN I  D-DIMER, QUANTITATIVE (NOT AT [facility])  CBG MONITORING, ED    Imaging Review Ct Angio Head W/cm &/or Wo Cm  05/26/2015  CLINICAL DATA:  Moderate headache for 3 days, worse for 4 hours. Dizziness,  lightheadedness, blurry vision, LEFT neck pain. History of migraines, diabetes and anxiety. EXAM: CT ANGIOGRAPHY HEAD AND NECK TECHNIQUE: Multidetector CT imaging of the head and neck was performed using the standard protocol during bolus administration of intravenous contrast. Multiplanar CT image reconstructions and MIPs were obtained to evaluate the vascular anatomy. Carotid stenosis measurements (when applicable) are obtained utilizing NASCET criteria, using the distal internal carotid diameter as the denominator. CONTRAST:  75 cc Isovue 370 COMPARISON:  CT HEAD May 25, 2015 at 2157 hours FINDINGS: CTA NECK Aortic arch: Normal appearance of the thoracic arch, 2 vessel arch is a normal variant. The origins of the innominate, left Common carotid artery and subclavian artery are widely patent. Right carotid system: Common carotid artery is widely patent, coursing in a straight line fashion. Normal appearance of the carotid bifurcation without hemodynamically significant stenosis by NASCET criteria. Normal appearance of the included internal carotid artery. Left carotid system: Common carotid artery is widely patent, coursing in a  straight line fashion. Normal appearance of the carotid bifurcation without hemodynamically significant stenosis by NASCET criteria. Normal appearance of the included internal carotid artery. Vertebral arteries:Left vertebral artery is dominant. Normal appearance of the vertebral arteries, which appear widely patent. Skeleton: No acute osseous process though bone windows have not been submitted. No significant degenerative change of the cervical spine. Other neck: Soft tissues of the neck are non-acute though, not tailored for evaluation. CTA HEAD Anterior circulation: Normal appearance of the cervical internal carotid arteries, petrous, cavernous and supra clinoid internal carotid arteries. Mild luminal irregularities supraclinoid internal carotid arteries most consistent with atherosclerosis. Widely patent anterior communicating artery. Normal appearance of the anterior and middle cerebral arteries. Posterior circulation: Normal appearance of the vertebral arteries, vertebrobasilar junction and basilar artery, as well as main branch vessels. Widely patent posterior cerebral arteries. There are mild luminal regularity. No large vessel occlusion, hemodynamically significant stenosis, dissection, luminal irregularity, contrast extravasation or aneurysm within the anterior nor posterior circulation. No abnormal intracranial enhancement. IMPRESSION: Mild luminal irregularity of the posterior cerebral arteries and supraclinoid internal carotid arteries most compatible with atherosclerosis. Otherwise negative CTA HEAD and neck. Electronically Signed   By: Elon Alas M.D.   On: 05/26/2015 00:16   Dg Chest 2 View  05/25/2015  CLINICAL DATA:  Headache for 3 days that got really bad 4 hours ago, started having shortness of breath, lightheadedness and dizziness today, forgetfulness in the middle of a sentence twice today, type II diabetes mellitus, fibromyalgia, essential benign hypertension EXAM: CHEST  2 VIEW  COMPARISON:  None FINDINGS: Normal heart size, mediastinal contours, and pulmonary vascularity. Lungs clear. No pleural effusion or pneumothorax. Bones unremarkable. IMPRESSION: No acute abnormalities. Electronically Signed   By: Lavonia Dana M.D.   On: 05/25/2015 22:41   Ct Head Wo Contrast  05/25/2015  CLINICAL DATA:  Headache for 3 days beginning at posterior head, and headache got really bad 4 hours ago, started having shortness of breath, type II diabetes mellitus, essential hypertension, dizziness, EXAM: CT HEAD WITHOUT CONTRAST TECHNIQUE: Contiguous axial images were obtained from the base of the skull through the vertex without intravenous contrast. COMPARISON:  None FINDINGS: Normal ventricular morphology. No midline shift or mass effect. Normal appearance of brain parenchyma. No intracranial hemorrhage, mass lesion, or acute infarction. Visualized paranasal sinuses and mastoid air cells clear. Bones unremarkable. IMPRESSION: Normal exam. Electronically Signed   By: Lavonia Dana M.D.   On: 05/25/2015 22:44   Ct Angio Neck W/cm &/or Wo/cm  05/26/2015  CLINICAL DATA:  Moderate headache for 3 days, worse for 4 hours. Dizziness, lightheadedness, blurry vision, LEFT neck pain. History of migraines, diabetes and anxiety. EXAM: CT ANGIOGRAPHY HEAD AND NECK TECHNIQUE: Multidetector CT imaging of the head and neck was performed using the standard protocol during bolus administration of intravenous contrast. Multiplanar CT image reconstructions and MIPs were obtained to evaluate the vascular anatomy. Carotid stenosis measurements (when applicable) are obtained utilizing NASCET criteria, using the distal internal carotid diameter as the denominator. CONTRAST:  75 cc Isovue 370 COMPARISON:  CT HEAD May 25, 2015 at 2157 hours FINDINGS: CTA NECK Aortic arch: Normal appearance of the thoracic arch, 2 vessel arch is a normal variant. The origins of the innominate, left Common carotid artery and subclavian artery are  widely patent. Right carotid system: Common carotid artery is widely patent, coursing in a straight line fashion. Normal appearance of the carotid bifurcation without hemodynamically significant stenosis by NASCET criteria. Normal appearance of the included internal carotid artery. Left carotid system: Common carotid artery is widely patent, coursing in a straight line fashion. Normal appearance of the carotid bifurcation without hemodynamically significant stenosis by NASCET criteria. Normal appearance of the included internal carotid artery. Vertebral arteries:Left vertebral artery is dominant. Normal appearance of the vertebral arteries, which appear widely patent. Skeleton: No acute osseous process though bone windows have not been submitted. No significant degenerative change of the cervical spine. Other neck: Soft tissues of the neck are non-acute though, not tailored for evaluation. CTA HEAD Anterior circulation: Normal appearance of the cervical internal carotid arteries, petrous, cavernous and supra clinoid internal carotid arteries. Mild luminal irregularities supraclinoid internal carotid arteries most consistent with atherosclerosis. Widely patent anterior communicating artery. Normal appearance of the anterior and middle cerebral arteries. Posterior circulation: Normal appearance of the vertebral arteries, vertebrobasilar junction and basilar artery, as well as main branch vessels. Widely patent posterior cerebral arteries. There are mild luminal regularity. No large vessel occlusion, hemodynamically significant stenosis, dissection, luminal irregularity, contrast extravasation or aneurysm within the anterior nor posterior circulation. No abnormal intracranial enhancement. IMPRESSION: Mild luminal irregularity of the posterior cerebral arteries and supraclinoid internal carotid arteries most compatible with atherosclerosis. Otherwise negative CTA HEAD and neck. Electronically Signed   By: Elon Alas M.D.   On: 05/26/2015 00:16   I have personally reviewed and evaluated these images and lab results as part of my medical decision-making.   EKG Interpretation   Date/Time:  Monday May 25 2015 21:24:26 EDT Ventricular Rate:  121 PR Interval:  146 QRS Duration: 91 QT Interval:  319 QTC Calculation: 453 R Axis:   84 Text Interpretation:  Sinus tachycardia no significant change since 2015  Confirmed by Devontre Siedschlag MD, Ermie Glendenning [phone]) on 05/25/2015 9:33:43 PM      MDM   Final diagnoses:  Occipital headache  Posterior neck pain    Patient's headache is most likely coming from muscular etiology in her neck. She has normal range of motion although it does hurt somewhat to rotate to the left. However no stiffness appreciated. No fevers or elevated white blood cell count. Given her headache acutely worsened less than 6 hours between onset and CT scan I think that missed subarachnoid hemorrhage is very low. I highly doubt meningitis. She is overall well appearing. She is chronically tachycardic and this is not worse than typical today. I discussed possible transfer for MRI imaging given her transient dizziness and blurred vision. This would help rule out multiple sclerosis or stroke. After multiple discussions,  patient and husband want to be discharged tonight and they will follow-up with her PCP tomorrow. Discussed possible risks of this were to be a cerebellar stroke although I do think this is unlikely. Discussed strict return precautions.  I personally performed the services described in this documentation, which was scribed in my presence. The recorded information has been reviewed and is accurate.    Sherwood Gambler, MD 05/26/15 [phone]

## 2015-05-26 LAB — CBG MONITORING, ED: Glucose-Capillary: 242 mg/dL — ABNORMAL HIGH (ref 65–99)

## 2015-05-26 MED ORDER — MORPHINE SULFATE (PF) 4 MG/ML IV SOLN
4.0000 mg | Freq: Once | INTRAVENOUS | Status: AC
  Administered 2015-05-26: 4 mg via INTRAVENOUS
  Filled 2015-05-26: qty 1

## 2015-05-26 MED ORDER — SODIUM CHLORIDE 0.9 % IV BOLUS (SEPSIS)
1000.0000 mL | Freq: Once | INTRAVENOUS | Status: AC
  Administered 2015-05-26: 1000 mL via INTRAVENOUS

## 2015-05-27 ENCOUNTER — Encounter: Payer: Self-pay | Admitting: Sports Medicine

## 2015-05-27 ENCOUNTER — Ambulatory Visit (INDEPENDENT_AMBULATORY_CARE_PROVIDER_SITE_OTHER): Payer: BLUE CROSS/BLUE SHIELD | Admitting: Sports Medicine

## 2015-05-27 ENCOUNTER — Emergency Department (INDEPENDENT_AMBULATORY_CARE_PROVIDER_SITE_OTHER)
Admission: EM | Admit: 2015-05-27 | Discharge: 2015-05-27 | Disposition: A | Discharge status: Home / Self Care | Payer: BLUE CROSS/BLUE SHIELD | Source: Home / Self Care | Attending: Family Medicine | Admitting: Family Medicine

## 2015-05-27 ENCOUNTER — Encounter: Payer: Self-pay | Admitting: *Deleted

## 2015-05-27 VITALS — BP 133/85 | HR 139 | Temp 98.3°F | Wt 282.0 lb

## 2015-05-27 DIAGNOSIS — R5382 Chronic fatigue, unspecified: Secondary | ICD-10-CM

## 2015-05-27 DIAGNOSIS — Z8669 Personal history of other diseases of the nervous system and sense organs: Secondary | ICD-10-CM | POA: Diagnosis not present

## 2015-05-27 DIAGNOSIS — G44201 Tension-type headache, unspecified, intractable: Secondary | ICD-10-CM | POA: Diagnosis not present

## 2015-05-27 DIAGNOSIS — R000 Tachycardia, unspecified: Secondary | ICD-10-CM

## 2015-05-27 DIAGNOSIS — M5030 Other cervical disc degeneration, unspecified cervical region: Secondary | ICD-10-CM

## 2015-05-27 DIAGNOSIS — E669 Obesity, unspecified: Secondary | ICD-10-CM | POA: Diagnosis not present

## 2015-05-27 DIAGNOSIS — M542 Cervicalgia: Secondary | ICD-10-CM | POA: Diagnosis not present

## 2015-05-27 HISTORY — DX: Personal history of other diseases of the nervous system and sense organs: Z86.69

## 2015-05-27 MED ORDER — LORCASERIN HCL 10 MG PO TABS: 1.0000 | ORAL_TABLET | Freq: Two times a day (BID) | ORAL | Status: DC

## 2015-05-27 MED ORDER — KETOROLAC TROMETHAMINE 60 MG/2ML IM SOLN
60.0000 mg | Freq: Once | INTRAMUSCULAR | Status: AC
  Administered 2015-05-27: 60 mg via INTRAMUSCULAR

## 2015-05-27 MED ORDER — DEXAMETHASONE SODIUM PHOSPHATE 10 MG/ML IJ SOLN
10.0000 mg | Freq: Once | INTRAMUSCULAR | Status: AC
  Administered 2015-05-27: 10 mg via INTRAMUSCULAR

## 2015-05-27 MED ORDER — ATENOLOL 25 MG PO TABS: 25.0000 mg | ORAL_TABLET | Freq: Every day | ORAL | Status: DC

## 2015-05-27 MED ORDER — METOCLOPRAMIDE HCL 5 MG/ML IJ SOLN
10.0000 mg | Freq: Once | INTRAMUSCULAR | Status: AC
  Administered 2015-05-27: 10 mg via INTRAMUSCULAR

## 2015-05-27 MED ORDER — METAXALONE 800 MG PO TABS: 800.0000 mg | ORAL_TABLET | Freq: Three times a day (TID) | ORAL | Status: DC

## 2015-05-27 NOTE — ED Provider Notes (Signed)
CSN: [id number]     Arrival date & time 05/27/15  1741 History   First MD Initiated Contact with Patient 05/27/15 1822     Chief Complaint  Patient presents with  . Headache  . Tachycardia     HPI Comments: Patient complains of persistent occipital and posterior neck pain radiating into the left shoulder area. Her headache had started about five days ago after she had been under increased stress visiting with her parents. She was evaluated at [facility] ED two days ago with negative CT scan and normal white blood count.  Her headache was thought to be most likely muscular in etiology.  Patient declined MRI scan at that time, and decided to wait for a later MRI scan appointment.  Patient reports that she has a past history of migraine headaches.  However, her present headache is distinctly different, involving her neck and shoulder muscles.  She denies fevers, chills, and sweats. She has a history of inappropriate sinus tachycardia.  Patient is a 40 y.o. female presenting with headaches. The history is provided by the patient and the spouse.  Headache Pain location:  Occipital Quality:  Sharp and dull Radiates to: shoulders. Severity at highest:  9/10 Onset quality:  Gradual Duration:  5 days Timing:  Constant Progression:  Worsening Chronicity:  Recurrent Similar to prior headaches: no   Context: activity and bright light   Relieved by:  Nothing Worsened by:  Neck movement and light Ineffective treatments:  Prescription medications Associated symptoms: dizziness, fatigue, nausea, neck pain, neck stiffness and photophobia   Associated symptoms: no abdominal pain, no back pain, no blurred vision, no congestion, no cough, no diarrhea, no drainage, no ear pain, no eye pain, no facial pain, no fever, no focal weakness, no hearing loss, no loss of balance, no myalgias, no near-syncope, no numbness, no paresthesias, no seizures, no sinus pressure, no sore throat, no swollen glands, no  syncope, no tingling, no visual change, no vomiting and no weakness     Past Medical History  Diagnosis Date  . Anxiety   . Depression   . Abnormal uterine bleeding   . Type 2 diabetes mellitus ([place])   . Endometriosis of pelvis   . Sinus tachycardia ([place])   . Chronic pelvic pain in female   . History of ketoacidosis     01-15-2014  . PONV (postoperative nausea and vomiting)   . Wears contact lenses   . Arthritis     knees  . Fibromyalgia    Past Surgical History  Procedure Laterality Date  . Cesarean section  2002,  2005,  2008  . Breast reduction surgery  age 42  . Mandible surgery  age 89    Correct overbite  . Dilation and curettage of uterus N/A 02/13/2014    Procedure: DILATATION AND CURETTAGE;  Surgeon: Everitt Amber, MD;  Location: [facility];  Service: Gynecology;  Laterality: N/A;   Family History  Problem Relation Age of Onset  . Adopted: Yes  . Hyperlipidemia Mother   . Hypertension Mother    Social History  Substance Use Topics  . Smoking status: Never Smoker   . Smokeless tobacco: Never Used  . Alcohol Use: No   OB History    Gravida Para Term Preterm AB TAB SAB Ectopic Multiple Living   4 3 3       3      Review of Systems  Constitutional: Positive for fatigue. Negative for fever.  HENT: Negative for  congestion, ear pain, hearing loss, postnasal drip, sinus pressure and sore throat.   Eyes: Positive for photophobia. Negative for blurred vision and pain.  Respiratory: Negative for cough.   Cardiovascular: Negative for syncope and near-syncope.  Gastrointestinal: Positive for nausea. Negative for vomiting, abdominal pain and diarrhea.  Musculoskeletal: Positive for neck pain and neck stiffness. Negative for myalgias and back pain.  Neurological: Positive for dizziness and headaches. Negative for focal weakness, seizures, weakness, numbness, paresthesias and loss of balance.  All other systems reviewed and are negative.   Allergies    Review of patient's allergies indicates no known allergies.  Home Medications   Prior to Admission medications   Medication Sig Start Date End Date Taking? Authorizing Provider  ALPRAZolam Duanne Moron) 1 MG tablet TAKE 1 TABLET BY MOUTH TWICE A DAY AS NEEDED FOR ANXIETY 05/11/15   Silverio Decamp, MD  amitriptyline (ELAVIL) 50 MG tablet One half tab PO qHS for a week, then one tab PO qHS. 03/25/15   Silverio Decamp, MD  atenolol (TENORMIN) 25 MG tablet Take 1 tablet (25 mg total) by mouth daily. 05/27/15   Silverio Decamp, MD  cyclobenzaprine (FLEXERIL) 10 MG tablet ONE HALF TAB AT BEDTIME, THEN INCREASE GRADUALLY TO ONE TAB THREE TIMES A DAY. 05/11/15   Silverio Decamp, MD  Dapagliflozin-Metformin HCl ER (XIGDUO XR) 10-998 MG TB24 Take 1 tablet by mouth daily. 10/17/14   Silverio Decamp, MD  Dulaglutide (TRULICITY) 1.5 0000000 SOPN Inject 1.5 mg into the skin once a week. 12/24/14   Silverio Decamp, MD  DULoxetine (CYMBALTA) 60 MG capsule Take 60 mg by mouth every morning.    Historical Provider, MD  fenofibrate 160 MG tablet Take 1 tablet (160 mg total) by mouth daily. 12/24/14   Silverio Decamp, MD  gabapentin (NEURONTIN) 600 MG tablet TAKE 2 TABLETS (1,200 MG TOTAL) BY MOUTH 3 (THREE) TIMES DAILY. 05/03/15   Silverio Decamp, MD  lisinopril-hydrochlorothiazide (PRINZIDE,ZESTORETIC) 10-12.5 MG tablet Take 1 tablet by mouth daily. 05/06/15   Silverio Decamp, MD  Lorcaserin HCl 10 MG TABS Take 1 tablet by mouth 2 (two) times daily. 05/27/15   Silverio Decamp, MD  metaxalone (SKELAXIN) 800 MG tablet Take 1 tablet (800 mg total) by mouth 3 (three) times daily. May take one tab, one to three times daily 05/27/15   Kandra Nicolas, MD  topiramate (TOPAMAX) 50 MG tablet Take 1 tablet (50 mg total) by mouth daily. 12/23/14   Silverio Decamp, MD  traMADol (ULTRAM) 50 MG tablet TAKE 1-2 TABLETS BY MOUTH EVERY 8 HOURS MAX OF 6 TABLETS PER DAY 05/11/15    Silverio Decamp, MD   Meds Ordered and Administered this Visit  Medications - No data to display  BP 121/74 mmHg  Pulse 148  Temp(Src) 97.8 F (36.6 C) (Oral)  Resp 18  Ht 5\' 6"  (1.676 m)  Wt 280 lb (127.007 kg)  BMI 45.21 kg/m2  SpO2 98%  LMP 01/14/2014 No data found.   Physical Exam  Constitutional: She is oriented to person, place, and time.  Patient is obese (BMI 45.2)  HENT:  Head: Normocephalic.  Right Ear: Tympanic membrane, external ear and ear canal normal.  Left Ear: Tympanic membrane, external ear and ear canal normal.  Nose: Nose normal.  Mouth/Throat: Oropharynx is clear and moist.  Eyes: Conjunctivae and EOM are normal. Pupils are equal, round, and reactive to light. Right eye exhibits no discharge. Left eye exhibits no discharge.  Fundi benign.  Mild photophobia present.  Neck: Neck supple. No thyromegaly present.    Neck is supple but there is distinct tenderness to palpation over sternocleidomastoid muscles bilaterally, extending to occipital areas.  Cardiovascular: Regular rhythm and normal heart sounds.   Rate 124  Pulmonary/Chest: Breath sounds normal.  Abdominal: Bowel sounds are normal. There is no tenderness.  Musculoskeletal: She exhibits no edema.       Back:  There is distinct tenderness to palpation over trapezius muscles bilaterally.  Lymphadenopathy:    She has no cervical adenopathy.  Neurological: She is alert and oriented to person, place, and time. She has normal strength and normal reflexes. No cranial nerve deficit or sensory deficit. Coordination and gait normal.  Skin: Skin is warm and dry. No rash noted.  Psychiatric: She has a normal mood and affect. Her speech is normal and behavior is normal.  Nursing note and vitals reviewed.   ED Course  Procedures none   Imaging Review (studies performed in ED two days ago): Ct Angio Head W/cm &/or Wo Cm  05/26/2015  CLINICAL DATA:  Moderate headache for 3 days, worse for 4  hours. Dizziness, lightheadedness, blurry vision, LEFT neck pain. History of migraines, diabetes and anxiety. EXAM: CT ANGIOGRAPHY HEAD AND NECK TECHNIQUE: Multidetector CT imaging of the head and neck was performed using the standard protocol during bolus administration of intravenous contrast. Multiplanar CT image reconstructions and MIPs were obtained to evaluate the vascular anatomy. Carotid stenosis measurements (when applicable) are obtained utilizing NASCET criteria, using the distal internal carotid diameter as the denominator. CONTRAST:  75 cc Isovue 370 COMPARISON:  CT HEAD May 25, 2015 at 2157 hours FINDINGS: CTA NECK Aortic arch: Normal appearance of the thoracic arch, 2 vessel arch is a normal variant. The origins of the innominate, left Common carotid artery and subclavian artery are widely patent. Right carotid system: Common carotid artery is widely patent, coursing in a straight line fashion. Normal appearance of the carotid bifurcation without hemodynamically significant stenosis by NASCET criteria. Normal appearance of the included internal carotid artery. Left carotid system: Common carotid artery is widely patent, coursing in a straight line fashion. Normal appearance of the carotid bifurcation without hemodynamically significant stenosis by NASCET criteria. Normal appearance of the included internal carotid artery. Vertebral arteries:Left vertebral artery is dominant. Normal appearance of the vertebral arteries, which appear widely patent. Skeleton: No acute osseous process though bone windows have not been submitted. No significant degenerative change of the cervical spine. Other neck: Soft tissues of the neck are non-acute though, not tailored for evaluation. CTA HEAD Anterior circulation: Normal appearance of the cervical internal carotid arteries, petrous, cavernous and supra clinoid internal carotid arteries. Mild luminal irregularities supraclinoid internal carotid arteries most  consistent with atherosclerosis. Widely patent anterior communicating artery. Normal appearance of the anterior and middle cerebral arteries. Posterior circulation: Normal appearance of the vertebral arteries, vertebrobasilar junction and basilar artery, as well as main branch vessels. Widely patent posterior cerebral arteries. There are mild luminal regularity. No large vessel occlusion, hemodynamically significant stenosis, dissection, luminal irregularity, contrast extravasation or aneurysm within the anterior nor posterior circulation. No abnormal intracranial enhancement. IMPRESSION: Mild luminal irregularity of the posterior cerebral arteries and supraclinoid internal carotid arteries most compatible with atherosclerosis. Otherwise negative CTA HEAD and neck. Electronically Signed   By: Elon Alas M.D.   On: 05/26/2015 00:16   Dg Chest 2 View  05/25/2015  CLINICAL DATA:  Headache for 3 days that got really bad 4  hours ago, started having shortness of breath, lightheadedness and dizziness today, forgetfulness in the middle of a sentence twice today, type II diabetes mellitus, fibromyalgia, essential benign hypertension EXAM: CHEST  2 VIEW COMPARISON:  None FINDINGS: Normal heart size, mediastinal contours, and pulmonary vascularity. Lungs clear. No pleural effusion or pneumothorax. Bones unremarkable. IMPRESSION: No acute abnormalities. Electronically Signed   By: Lavonia Dana M.D.   On: 05/25/2015 22:41   Ct Head Wo Contrast  05/25/2015  CLINICAL DATA:  Headache for 3 days beginning at posterior head, and headache got really bad 4 hours ago, started having shortness of breath, type II diabetes mellitus, essential hypertension, dizziness, EXAM: CT HEAD WITHOUT CONTRAST TECHNIQUE: Contiguous axial images were obtained from the base of the skull through the vertex without intravenous contrast. COMPARISON:  None FINDINGS: Normal ventricular morphology. No midline shift or mass effect. Normal appearance  of brain parenchyma. No intracranial hemorrhage, mass lesion, or acute infarction. Visualized paranasal sinuses and mastoid air cells clear. Bones unremarkable. IMPRESSION: Normal exam. Electronically Signed   By: Lavonia Dana M.D.   On: 05/25/2015 22:44   Ct Angio Neck W/cm &/or Wo/cm  05/26/2015  CLINICAL DATA:  Moderate headache for 3 days, worse for 4 hours. Dizziness, lightheadedness, blurry vision, LEFT neck pain. History of migraines, diabetes and anxiety. EXAM: CT ANGIOGRAPHY HEAD AND NECK TECHNIQUE: Multidetector CT imaging of the head and neck was performed using the standard protocol during bolus administration of intravenous contrast. Multiplanar CT image reconstructions and MIPs were obtained to evaluate the vascular anatomy. Carotid stenosis measurements (when applicable) are obtained utilizing NASCET criteria, using the distal internal carotid diameter as the denominator. CONTRAST:  75 cc Isovue 370 COMPARISON:  CT HEAD May 25, 2015 at 2157 hours FINDINGS: CTA NECK Aortic arch: Normal appearance of the thoracic arch, 2 vessel arch is a normal variant. The origins of the innominate, left Common carotid artery and subclavian artery are widely patent. Right carotid system: Common carotid artery is widely patent, coursing in a straight line fashion. Normal appearance of the carotid bifurcation without hemodynamically significant stenosis by NASCET criteria. Normal appearance of the included internal carotid artery. Left carotid system: Common carotid artery is widely patent, coursing in a straight line fashion. Normal appearance of the carotid bifurcation without hemodynamically significant stenosis by NASCET criteria. Normal appearance of the included internal carotid artery. Vertebral arteries:Left vertebral artery is dominant. Normal appearance of the vertebral arteries, which appear widely patent. Skeleton: No acute osseous process though bone windows have not been submitted. No significant  degenerative change of the cervical spine. Other neck: Soft tissues of the neck are non-acute though, not tailored for evaluation. CTA HEAD Anterior circulation: Normal appearance of the cervical internal carotid arteries, petrous, cavernous and supra clinoid internal carotid arteries. Mild luminal irregularities supraclinoid internal carotid arteries most consistent with atherosclerosis. Widely patent anterior communicating artery. Normal appearance of the anterior and middle cerebral arteries. Posterior circulation: Normal appearance of the vertebral arteries, vertebrobasilar junction and basilar artery, as well as main branch vessels. Widely patent posterior cerebral arteries. There are mild luminal regularity. No large vessel occlusion, hemodynamically significant stenosis, dissection, luminal irregularity, contrast extravasation or aneurysm within the anterior nor posterior circulation. No abnormal intracranial enhancement. IMPRESSION: Mild luminal irregularity of the posterior cerebral arteries and supraclinoid internal carotid arteries most compatible with atherosclerosis. Otherwise negative CTA HEAD and neck. Electronically Signed   By: Elon Alas M.D.   On: 05/26/2015 00:16     MDM   1.  Intractable tension-type headache, unspecified chronicity pattern   2. Cervical pain (neck)    Concern for possible cervical radiculopathy. Administered Toradol 60mg  IM, Reglan 10mg  IM, and Decadron 10mg  IM Trial of Skelaxin 800mg  TID. If symptoms become significantly worse during the night or over the weekend, proceed to the local emergency room.  Followup with Dr. Aundria Mems as scheduled.    Kandra Nicolas, MD 05/28/15 [phone]

## 2015-05-27 NOTE — Assessment & Plan Note (Addendum)
Cervical epidural to show some disc fusions with a bit of effacement of the ventral thecal sac, no overt foraminal stenosis. There is also a bit of facet degenerative changes. We are going to proceed with a right C6-C7 interlaminar epidural for diagnostic and therapy purposes, we can always target the facets at a future visit if no improvement. Additional referral to [place] [facility].

## 2015-05-27 NOTE — Discharge Instructions (Signed)
If symptoms become significantly worse during the night or over the weekend, proceed to the local emergency room.  

## 2015-05-27 NOTE — ED Notes (Signed)
Pt c/o increased HA, nausea, neck stiffness and dizziness since her appt this morning with Dr Darene Lamer. She has taken Zofran without relief.

## 2015-05-27 NOTE — Assessment & Plan Note (Signed)
Adding low dose atenolol. Return in 1 month.

## 2015-05-27 NOTE — Assessment & Plan Note (Signed)
At this point we are going to proceed with brain MRI, atenolol for tachycardia will also help her migraines.

## 2015-05-27 NOTE — Progress Notes (Signed)
  Subjective:    CC: Multiple issues  HPI: Recently hospitalized for UTI symptoms, CT of the abdomen and pelvis was negative, urinalysis showed leukocytes and she was treated with IV antibiotics and now is on oral antibiotics. She still has a bit of left flank pain and right lower quadrant pain, improving.  Neck pain: Moderate, persistent with radiation into the right upper shoulder, down to the elbow but not past the elbow into the hands, occasionally has some mild paresthesias in both hands. Has never had a cervical epidural.  Headaches: Severe, pulsatile, bilateral, with foot full B and photophobia consistent with migraines.  Sinus tachycardia: Blood pressure is elevated, she has seen a cardiologist, agreeable to start a beta blocker with the understanding this will help her migraines as well.  Past medical history, Surgical history, Family history not pertinant except as noted below, Social history, Allergies, and medications have been entered into the medical record, reviewed, and no changes needed.   Review of Systems: No fevers, chills, night sweats, weight loss, chest pain, or shortness of breath.   Objective:    General: Well Developed, well nourished, and in no acute distress.  Neuro: Alert and oriented x3, extra-ocular muscles intact, sensation grossly intact.  HEENT: Normocephalic, atraumatic, pupils equal round reactive to light, neck supple, no masses, no lymphadenopathy, thyroid nonpalpable.  Skin: Warm and dry, no rashes. Cardiac: Regular rate and rhythm, no murmurs rubs or gallops, no lower extremity edema.  Respiratory: Clear to auscultation bilaterally. Not using accessory muscles, speaking in full sentences.  Impression and Recommendations:   I spent 25 minutes with this patient, greater than 50% was face-to-face time counseling regarding the above diagnoses

## 2015-05-28 ENCOUNTER — Telehealth: Payer: Self-pay | Admitting: *Deleted

## 2015-05-28 NOTE — ED Notes (Signed)
Calback: LMOM f/u from visit, call back as needed. Keep f/u apt with Dr. Dianah Field

## 2015-06-01 ENCOUNTER — Ambulatory Visit (INDEPENDENT_AMBULATORY_CARE_PROVIDER_SITE_OTHER): Payer: BLUE CROSS/BLUE SHIELD

## 2015-06-01 DIAGNOSIS — R51 Headache: Secondary | ICD-10-CM | POA: Diagnosis not present

## 2015-06-01 MED ORDER — GADOBENATE DIMEGLUMINE 529 MG/ML IV SOLN
20.0000 mL | Freq: Once | INTRAVENOUS | Status: AC | PRN
  Administered 2015-06-01: 20 mL via INTRAVENOUS

## 2015-06-09 ENCOUNTER — Other Ambulatory Visit: Payer: Self-pay | Admitting: Sports Medicine

## 2015-06-10 ENCOUNTER — Ambulatory Visit
Admission: RE | Admit: 2015-06-10 | Discharge: 2015-06-10 | Disposition: A | Discharge status: Home / Self Care | Payer: BLUE CROSS/BLUE SHIELD | Source: Ambulatory Visit | Attending: Sports Medicine | Admitting: Sports Medicine

## 2015-06-10 MED ORDER — TRIAMCINOLONE ACETONIDE 40 MG/ML IJ SUSP (RADIOLOGY)
60.0000 mg | Freq: Once | INTRAMUSCULAR | Status: AC
  Administered 2015-06-10: 60 mg via EPIDURAL

## 2015-06-10 MED ORDER — IOPAMIDOL (ISOVUE-M 300) INJECTION 61%
1.0000 mL | Freq: Once | INTRAMUSCULAR | Status: AC | PRN
  Administered 2015-06-10: 1 mL via EPIDURAL

## 2015-06-10 NOTE — Discharge Instructions (Signed)

## 2015-06-11 ENCOUNTER — Encounter: Payer: Self-pay | Admitting: Sports Medicine

## 2015-06-12 ENCOUNTER — Other Ambulatory Visit: Payer: Self-pay

## 2015-06-12 MED ORDER — TRAMADOL HCL 50 MG PO TABS: ORAL_TABLET | ORAL | Status: DC

## 2015-06-15 ENCOUNTER — Other Ambulatory Visit: Payer: Self-pay | Admitting: Sports Medicine

## 2015-07-01 ENCOUNTER — Other Ambulatory Visit: Payer: Self-pay | Admitting: Sports Medicine

## 2015-07-01 DIAGNOSIS — N3000 Acute cystitis without hematuria: Secondary | ICD-10-CM

## 2015-07-01 DIAGNOSIS — D649 Anemia, unspecified: Secondary | ICD-10-CM

## 2015-07-01 HISTORY — DX: Anemia, unspecified: D64.9

## 2015-07-01 LAB — COMPREHENSIVE METABOLIC PANEL
ALT: 15 U/L (ref 6–29)
AST: 11 U/L (ref 10–30)
Albumin: 4.6 g/dL (ref 3.6–5.1)
Alkaline Phosphatase: 52 U/L (ref 33–115)
BUN: 17 mg/dL (ref 7–25)
CO2: 22 mmol/L (ref 20–31)
Calcium: 9.6 mg/dL (ref 8.6–10.2)
Chloride: 101 mmol/L (ref 98–110)
Creat: 1.08 mg/dL (ref 0.50–1.10)
Glucose, Bld: 162 mg/dL — ABNORMAL HIGH (ref 65–99)
Potassium: 4.8 mmol/L (ref 3.5–5.3)
Sodium: 135 mmol/L (ref 135–146)
Total Bilirubin: 0.3 mg/dL (ref 0.2–1.2)
Total Protein: 7.1 g/dL (ref 6.1–8.1)

## 2015-07-01 LAB — IRON AND TIBC
%SAT: 9 % — ABNORMAL LOW (ref 11–50)
Iron: 45 ug/dL (ref 40–190)
TIBC: 503 ug/dL — ABNORMAL HIGH (ref 250–450)
UIBC: 458 ug/dL — ABNORMAL HIGH (ref 125–400)

## 2015-07-01 LAB — FERRITIN: Ferritin: 74 ng/mL (ref 10–154)

## 2015-07-01 LAB — URINALYSIS
Bilirubin Urine: NEGATIVE
Hgb urine dipstick: NEGATIVE
Ketones, ur: NEGATIVE
Nitrite: NEGATIVE
Protein, ur: NEGATIVE
Specific Gravity, Urine: 1.031 (ref 1.001–1.035)
pH: 6 (ref 5.0–8.0)

## 2015-07-01 LAB — CBC
HCT: 46.9 % — ABNORMAL HIGH (ref 35.0–45.0)
Hemoglobin: 15.3 g/dL (ref 11.7–15.5)
MCH: 26.1 pg — ABNORMAL LOW (ref 27.0–33.0)
MCHC: 32.6 g/dL (ref 32.0–36.0)
MCV: 79.9 fL — ABNORMAL LOW (ref 80.0–100.0)
MPV: 10.2 fL (ref 7.5–12.5)
Platelets: 405 K/uL — ABNORMAL HIGH (ref 140–400)
RBC: 5.87 MIL/uL — ABNORMAL HIGH (ref 3.80–5.10)
RDW: 15.4 % — ABNORMAL HIGH (ref 11.0–15.0)
WBC: 12.1 K/uL — ABNORMAL HIGH (ref 3.8–10.8)

## 2015-07-01 LAB — VITAMIN B12: Vitamin B-12: 403 pg/mL (ref 200–1100)

## 2015-07-01 LAB — TSH: TSH: 2.71 mIU/L

## 2015-07-01 LAB — FOLATE: Folate: 4.2 ng/mL — ABNORMAL LOW (ref >5.4)

## 2015-07-01 MED ORDER — CEPHALEXIN 500 MG PO CAPS: 500.0000 mg | ORAL_CAPSULE | Freq: Two times a day (BID) | ORAL | Status: DC

## 2015-07-01 MED ORDER — FERROUS SULFATE 325 (65 FE) MG PO TBEC: 325.0000 mg | DELAYED_RELEASE_TABLET | Freq: Three times a day (TID) | ORAL | Status: DC

## 2015-07-01 MED ORDER — FOLIC ACID 1 MG PO TABS: 5.0000 mg | ORAL_TABLET | Freq: Every day | ORAL | Status: DC

## 2015-07-01 NOTE — Assessment & Plan Note (Signed)
Adding Keflex.

## 2015-07-01 NOTE — Assessment & Plan Note (Signed)
Looks like folate and iron deficiency, adding supplementation and we can recheck in a month.

## 2015-07-03 ENCOUNTER — Other Ambulatory Visit: Payer: Self-pay | Admitting: Sports Medicine

## 2015-07-03 ENCOUNTER — Other Ambulatory Visit: Payer: Self-pay | Admitting: Neurosurgery

## 2015-07-03 DIAGNOSIS — M5441 Lumbago with sciatica, right side: Secondary | ICD-10-CM

## 2015-07-03 LAB — URINE CULTURE: Colony Count: >=100000

## 2015-07-03 MED ORDER — LEVOFLOXACIN 750 MG PO TABS: 750.0000 mg | ORAL_TABLET | Freq: Every day | ORAL | Status: DC

## 2015-07-08 ENCOUNTER — Ambulatory Visit: Payer: BLUE CROSS/BLUE SHIELD | Admitting: Sports Medicine

## 2015-07-13 ENCOUNTER — Other Ambulatory Visit: Payer: Self-pay | Admitting: Sports Medicine

## 2015-07-17 ENCOUNTER — Encounter: Payer: Self-pay | Admitting: Sports Medicine

## 2015-07-20 ENCOUNTER — Ambulatory Visit (INDEPENDENT_AMBULATORY_CARE_PROVIDER_SITE_OTHER): Payer: BLUE CROSS/BLUE SHIELD

## 2015-07-20 DIAGNOSIS — M5441 Lumbago with sciatica, right side: Secondary | ICD-10-CM | POA: Diagnosis not present

## 2015-07-21 ENCOUNTER — Ambulatory Visit (INDEPENDENT_AMBULATORY_CARE_PROVIDER_SITE_OTHER): Payer: BLUE CROSS/BLUE SHIELD | Admitting: Sports Medicine

## 2015-07-21 VITALS — BP 118/80 | HR 121 | Resp 18 | Wt 283.0 lb

## 2015-07-21 DIAGNOSIS — M797 Fibromyalgia: Secondary | ICD-10-CM | POA: Diagnosis not present

## 2015-07-21 DIAGNOSIS — D649 Anemia, unspecified: Secondary | ICD-10-CM

## 2015-07-21 DIAGNOSIS — I10 Essential (primary) hypertension: Secondary | ICD-10-CM | POA: Diagnosis not present

## 2015-07-21 DIAGNOSIS — N3000 Acute cystitis without hematuria: Secondary | ICD-10-CM

## 2015-07-21 LAB — POCT URINALYSIS DIPSTICK
Bilirubin, UA: NEGATIVE
Blood, UA: NEGATIVE
Glucose, UA: NEGATIVE
Ketones, UA: NEGATIVE
Leukocytes, UA: NEGATIVE
Nitrite, UA: NEGATIVE
Protein, UA: NEGATIVE
Spec Grav, UA: <=1.005
Urobilinogen, UA: 0.2
pH, UA: 5.5

## 2015-07-21 MED ORDER — DULOXETINE HCL 60 MG PO CPEP: 120.0000 mg | ORAL_CAPSULE | Freq: Every day | ORAL | Status: DC

## 2015-07-21 NOTE — Progress Notes (Addendum)
Patient ID: Christine Grant, female   DOB: May 11, 1975, 40 y.o.   MRN: [id number]  Subjective:    CC: re-check following UTI   HPI: Patient recently diagnosed with Enterococcus UTI, here for follow-up.  No complaints of dysuria.  Also complaining of low back pain that does not radiate down her legs.  Has a history of fibromyalgia.  Imaging of L spine does not show any concerning pathology.    Past medical history, Surgical history, Family history not pertinant except as noted below, Social history, Allergies, and medications have been entered into the medical record, reviewed, and no changes needed.   Review of Systems: No fevers, chills, night sweats, weight loss, chest pain, or shortness of breath.   Objective:    General: Well Developed, well nourished, and in no acute distress.  Neuro: Alert and oriented x3, extra-ocular muscles intact, sensation grossly intact.  HEENT: Normocephalic, atraumatic, pupils equal round reactive to light, neck supple, no masses, no lymphadenopathy, thyroid nonpalpable.  Skin: Warm and dry, no rashes. Cardiac: Regular rate and rhythm, no murmurs rubs or gallops, no lower extremity edema.  Respiratory: Clear to auscultation bilaterally. Not using accessory muscles, speaking in full sentences. Back Exam:  Inspection: Unremarkable  Palpable tenderness: Positive near SI joints and diffusely throughout back. No CVA tenderness.    Impression and Recommendations:   1. UTI treated with levofloxacin.  UA negative. No concern for pyelo. 2. Anemia - will repeat CBC today.  Continue folate.  3. Back pain - fibromyalgia pain.  Will increase Cymbalta to 120 mg.   I spent 25 minutes with this patient, greater than 50% was face-to-face time counseling regarding the above diagnoses

## 2015-07-21 NOTE — Assessment & Plan Note (Addendum)
No evidence of pyelonephritis, back pain is myofascial. Rechecking urinalysis, previous urine culture was enterococcus, currently finishing a course of levofloxacin. Urinalysis is negative.

## 2015-07-21 NOTE — Assessment & Plan Note (Signed)
Multifactorial iron deficiency and folate deficiency, continue supplementation and recheck indices in 2 weeks

## 2015-07-21 NOTE — Assessment & Plan Note (Addendum)
Having a recurrence, increasing Cymbalta 120 mg. Recent completely normal lumbar spine MRI, she needs to follow up with pain management.

## 2015-07-21 NOTE — Assessment & Plan Note (Signed)
Controlled, no changes. 

## 2015-07-21 NOTE — Progress Notes (Deleted)
  Subjective:    CC:   HPI:  Past medical history, Surgical history, Family history not pertinant except as noted below, Social history, Allergies, and medications have been entered into the medical record, reviewed, and no changes needed.   Review of Systems: No fevers, chills, night sweats, weight loss, chest pain, or shortness of breath.   Objective:    General: Well Developed, well nourished, and in no acute distress.  Neuro: Alert and oriented x3, extra-ocular muscles intact, sensation grossly intact.  HEENT: Normocephalic, atraumatic, pupils equal round reactive to light, neck supple, no masses, no lymphadenopathy, thyroid nonpalpable.  Skin: Warm and dry, no rashes. Cardiac: Regular rate and rhythm, no murmurs rubs or gallops, no lower extremity edema.  Respiratory: Clear to auscultation bilaterally. Not using accessory muscles, speaking in full sentences.   Impression and Recommendations:     

## 2015-08-02 ENCOUNTER — Encounter: Payer: Self-pay | Admitting: Sports Medicine

## 2015-08-11 ENCOUNTER — Ambulatory Visit: Payer: BLUE CROSS/BLUE SHIELD | Admitting: Sports Medicine

## 2015-08-11 ENCOUNTER — Other Ambulatory Visit: Payer: Self-pay | Admitting: Sports Medicine

## 2015-08-11 ENCOUNTER — Encounter: Payer: Self-pay | Admitting: Sports Medicine

## 2015-08-18 ENCOUNTER — Ambulatory Visit: Payer: BLUE CROSS/BLUE SHIELD | Admitting: Sports Medicine

## 2015-08-30 ENCOUNTER — Other Ambulatory Visit: Payer: Self-pay | Admitting: Sports Medicine

## 2015-09-14 ENCOUNTER — Other Ambulatory Visit: Payer: Self-pay | Admitting: Sports Medicine

## 2015-09-16 ENCOUNTER — Other Ambulatory Visit: Payer: Self-pay | Admitting: Neurosurgery

## 2015-09-16 ENCOUNTER — Other Ambulatory Visit (HOSPITAL_COMMUNITY): Payer: Self-pay | Admitting: Neurosurgery

## 2015-09-16 DIAGNOSIS — M5441 Lumbago with sciatica, right side: Secondary | ICD-10-CM

## 2015-10-02 ENCOUNTER — Ambulatory Visit (HOSPITAL_COMMUNITY)
Admission: RE | Admit: 2015-10-02 | Discharge: 2015-10-02 | Disposition: A | Discharge status: Home / Self Care | Payer: BLUE CROSS/BLUE SHIELD | Source: Ambulatory Visit | Attending: Neurosurgery | Admitting: Neurosurgery

## 2015-10-02 DIAGNOSIS — M5441 Lumbago with sciatica, right side: Secondary | ICD-10-CM | POA: Diagnosis present

## 2015-10-02 DIAGNOSIS — M5136 Other intervertebral disc degeneration, lumbar region: Secondary | ICD-10-CM | POA: Insufficient documentation

## 2015-10-02 DIAGNOSIS — M5137 Other intervertebral disc degeneration, lumbosacral region: Secondary | ICD-10-CM | POA: Diagnosis not present

## 2015-10-02 DIAGNOSIS — M2578 Osteophyte, vertebrae: Secondary | ICD-10-CM | POA: Insufficient documentation

## 2015-10-02 MED ORDER — IOPAMIDOL (ISOVUE-M 200) INJECTION 41%
20.0000 mL | Freq: Once | INTRAMUSCULAR | Status: AC
  Administered 2015-10-02: 20 mL via INTRATHECAL

## 2015-10-02 MED ORDER — LIDOCAINE HCL 1 % IJ SOLN
INTRAMUSCULAR | Status: AC
  Filled 2015-10-02: qty 10

## 2015-10-02 MED ORDER — IOPAMIDOL (ISOVUE-M 300) INJECTION 61%
INTRAMUSCULAR | Status: AC
Start: 2015-10-02 — End: 2015-10-02
  Filled 2015-10-02: qty 15

## 2015-10-02 MED ORDER — DIAZEPAM 5 MG PO TABS
ORAL_TABLET | ORAL | Status: AC
  Administered 2015-10-02: 10 mg via ORAL
  Filled 2015-10-02: qty 2

## 2015-10-02 MED ORDER — ONDANSETRON HCL 4 MG/2ML IJ SOLN: 4.0000 mg | Freq: Four times a day (QID) | INTRAMUSCULAR | Status: DC | PRN

## 2015-10-02 MED ORDER — DIAZEPAM 5 MG PO TABS
10.0000 mg | ORAL_TABLET | Freq: Once | ORAL | Status: AC
Start: 2015-10-02 — End: 2015-10-02
  Administered 2015-10-02: 10 mg via ORAL

## 2015-10-02 MED ORDER — OXYCODONE HCL 5 MG PO TABS
5.0000 mg | ORAL_TABLET | ORAL | Status: DC | PRN
  Administered 2015-10-02: 10 mg via ORAL

## 2015-10-02 MED ORDER — OXYCODONE HCL 5 MG PO TABS
ORAL_TABLET | ORAL | Status: AC
  Filled 2015-10-02: qty 2

## 2015-10-02 MED ORDER — LIDOCAINE HCL (PF) 1 % IJ SOLN
10.0000 mL | Freq: Once | INTRAMUSCULAR | Status: AC
  Administered 2015-10-02: 5 mL via INTRADERMAL

## 2015-10-02 NOTE — Discharge Instructions (Signed)
Myelography, Care After °These instructions give you information on caring for yourself after your procedure. Your doctor may also give you more specific instructions. Call your doctor if you have any problems or questions after your procedure. °HOME CARE °· Rest the first day. °· When you rest, lie flat, with your head slightly raised (elevated). °· Avoid heavy lifting and activity for 48 hours, or as told by your doctor. °· You may take the bandage (dressing) off one day after the test, or as told by your doctor. °· Take all medicines only as told by your doctor. °· Ask your doctor when it is okay to take a shower or bath. °· Ask your doctor when your test results will be ready and how you can get them. Make sure you follow up and get your results. °· Do not drink alcohol for 24 hours, or as told by your doctor. °· Drink enough fluid to keep your pee (urine) clear or pale yellow. °GET HELP IF:  °· You have a fever. °· You have a headache. °· You feel sick to your stomach (nauseous) or throw up (vomit). °· You have pain or cramping in your belly (abdomen). °GET HELP RIGHT AWAY IF:  °· You have a headache with a stiff neck or fever. °· You have trouble breathing. °· Any of the places where the needles were put in are: °¨ Puffy (swollen) or red. °¨ Sore or hot to the touch. °¨ Draining yellowish-white fluid (pus). °¨ Bleeding. °MAKE SURE YOU: °· Understand these instructions. °· Will watch your condition. °· Will get help right away if you are not doing well or get worse. °  °This information is not intended to replace advice given to you by your health care provider. Make sure you discuss any questions you have with your health care provider. °  °Document Released: 10/06/2007 Document Revised: 01/17/2014 Document Reviewed: 09/21/2011 °[employer]vier Interactive Patient Education ©2016 [employer] ° °

## 2015-10-02 NOTE — Op Note (Signed)
  lumbar Myelogram  PATIENT:  Christine Grant is a 40 y.o. female with pain in the lower back and right lower extremity  PRE-OPERATIVE DIAGNOSIS:  Lumbago with right lower extremity pain  POST-OPERATIVE DIAGNOSIS:  same  PROCEDURE:  Lumbar Myelogram  SURGEON:  Willia Genrich  ANESTHESIA:   local LOCAL MEDICATIONS USED:  LIDOCAINE  and Amount: 10 ml Procedure Note: Christine Grant is a 40 y.o. female Was taken to the fluoroscopy suite and  positioned prone on the fluoroscopy table. Her back was prepared and draped in a sterile manner. I infiltrated 10 cc into the lumbar region. I then introduced a spinal needle into the thecal sac at the L2/3  interlaminar space. I infiltrated 20cc of Isovue 200 into the thecal sac. Fluoroscopy showed the needle and contrast in the thecal sac. Christine Grant tolerated the procedure well. she Will be taken to CT for evaluation.     PATIENT DISPOSITION:  Short Stay

## 2015-10-12 ENCOUNTER — Other Ambulatory Visit: Payer: Self-pay | Admitting: Sports Medicine

## 2015-10-12 DIAGNOSIS — R000 Tachycardia, unspecified: Secondary | ICD-10-CM

## 2015-10-14 ENCOUNTER — Other Ambulatory Visit: Payer: Self-pay | Admitting: Family Medicine

## 2015-10-14 DIAGNOSIS — R162 Hepatomegaly with splenomegaly, not elsewhere classified: Secondary | ICD-10-CM

## 2015-10-27 ENCOUNTER — Other Ambulatory Visit: Payer: Self-pay | Admitting: Sports Medicine

## 2015-10-27 ENCOUNTER — Ambulatory Visit
Admission: RE | Admit: 2015-10-27 | Discharge: 2015-10-27 | Disposition: A | Discharge status: Home / Self Care | Payer: BLUE CROSS/BLUE SHIELD | Source: Ambulatory Visit | Attending: Family Medicine | Admitting: Family Medicine

## 2015-10-27 DIAGNOSIS — R162 Hepatomegaly with splenomegaly, not elsewhere classified: Secondary | ICD-10-CM

## 2015-10-27 DIAGNOSIS — E669 Obesity, unspecified: Principal | ICD-10-CM

## 2015-10-27 DIAGNOSIS — E1169 Type 2 diabetes mellitus with other specified complication: Secondary | ICD-10-CM

## 2015-12-06 ENCOUNTER — Other Ambulatory Visit: Payer: Self-pay | Admitting: Sports Medicine

## 2015-12-06 DIAGNOSIS — E669 Obesity, unspecified: Principal | ICD-10-CM

## 2015-12-06 DIAGNOSIS — E1169 Type 2 diabetes mellitus with other specified complication: Secondary | ICD-10-CM

## 2015-12-18 ENCOUNTER — Other Ambulatory Visit: Payer: Self-pay | Admitting: Sports Medicine

## 2016-01-02 ENCOUNTER — Other Ambulatory Visit: Payer: Self-pay | Admitting: Sports Medicine

## 2016-01-12 ENCOUNTER — Other Ambulatory Visit: Payer: Self-pay | Admitting: Sports Medicine

## 2016-02-06 ENCOUNTER — Other Ambulatory Visit: Payer: Self-pay | Admitting: Sports Medicine

## 2016-02-06 DIAGNOSIS — E669 Obesity, unspecified: Principal | ICD-10-CM

## 2016-02-06 DIAGNOSIS — E1169 Type 2 diabetes mellitus with other specified complication: Secondary | ICD-10-CM

## 2016-03-09 ENCOUNTER — Other Ambulatory Visit: Payer: Self-pay | Admitting: Sports Medicine

## 2016-03-09 DIAGNOSIS — E781 Pure hyperglyceridemia: Secondary | ICD-10-CM

## 2016-03-24 ENCOUNTER — Other Ambulatory Visit: Payer: Self-pay | Admitting: Sports Medicine

## 2016-03-24 DIAGNOSIS — M5030 Other cervical disc degeneration, unspecified cervical region: Secondary | ICD-10-CM

## 2016-07-07 LAB — LIPID PANEL
Cholesterol: 248 — AB (ref 0–200)
HDL: 35 (ref 35–70)
LDL Cholesterol: 132
Triglycerides: 654 — AB (ref 40–160)

## 2016-07-07 LAB — HEMOGLOBIN A1C: Hemoglobin A1C: 10.6

## 2016-07-15 ENCOUNTER — Emergency Department (HOSPITAL_BASED_OUTPATIENT_CLINIC_OR_DEPARTMENT_OTHER)
Admission: EM | Admit: 2016-07-15 | Discharge: 2016-07-15 | Disposition: A | Discharge status: Home / Self Care | Payer: 59 | Attending: Emergency Medicine | Admitting: Emergency Medicine

## 2016-07-15 ENCOUNTER — Encounter (HOSPITAL_BASED_OUTPATIENT_CLINIC_OR_DEPARTMENT_OTHER): Payer: Self-pay | Admitting: Emergency Medicine

## 2016-07-15 DIAGNOSIS — M545 Low back pain, unspecified: Secondary | ICD-10-CM

## 2016-07-15 DIAGNOSIS — I10 Essential (primary) hypertension: Secondary | ICD-10-CM | POA: Insufficient documentation

## 2016-07-15 DIAGNOSIS — E119 Type 2 diabetes mellitus without complications: Secondary | ICD-10-CM | POA: Insufficient documentation

## 2016-07-15 LAB — URINALYSIS, MICROSCOPIC (REFLEX)
RBC / HPF: NONE SEEN RBC/hpf (ref 0–5)
WBC, UA: NONE SEEN WBC/hpf (ref 0–5)

## 2016-07-15 LAB — URINALYSIS, ROUTINE W REFLEX MICROSCOPIC
Bilirubin Urine: NEGATIVE
Glucose, UA: >=500 mg/dL — AB
Hgb urine dipstick: NEGATIVE
Ketones, ur: NEGATIVE mg/dL
Leukocytes, UA: NEGATIVE
Nitrite: NEGATIVE
Protein, ur: NEGATIVE mg/dL
Specific Gravity, Urine: 1.041 — ABNORMAL HIGH (ref 1.005–1.030)
pH: 5 (ref 5.0–8.0)

## 2016-07-15 MED ORDER — OXYCODONE-ACETAMINOPHEN 5-325 MG PO TABS
1.0000 | ORAL_TABLET | Freq: Once | ORAL | Status: AC
  Administered 2016-07-15: 1 via ORAL
  Filled 2016-07-15: qty 1

## 2016-07-15 MED ORDER — KETOROLAC TROMETHAMINE 30 MG/ML IJ SOLN
30.0000 mg | Freq: Once | INTRAMUSCULAR | Status: AC
  Administered 2016-07-15: 30 mg via INTRAMUSCULAR
  Filled 2016-07-15: qty 1

## 2016-07-15 MED ORDER — METHOCARBAMOL 500 MG PO TABS
500.0000 mg | ORAL_TABLET | Freq: Once | ORAL | Status: AC
  Administered 2016-07-15: 500 mg via ORAL
  Filled 2016-07-15: qty 1

## 2016-07-15 MED ORDER — IBUPROFEN 800 MG PO TABS: 800.0000 mg | ORAL_TABLET | Freq: Three times a day (TID) | ORAL | 0 refills | Status: DC | PRN

## 2016-07-15 MED ORDER — PREDNISONE 10 MG PO TABS: 20.0000 mg | ORAL_TABLET | Freq: Every day | ORAL | 0 refills | Status: AC

## 2016-07-15 NOTE — Discharge Instructions (Signed)

## 2016-07-15 NOTE — ED Provider Notes (Signed)
Emergency Department Provider Note   I have reviewed the triage vital signs and the nursing notes.   HISTORY  Chief Complaint Back Pain   HPI Christine Grant is a 41 y.o. female with PMH of Fibromyalgia, DM, and episodic back pain presents to the emergency department for evaluation of lower back pain which started today. She describes it as severe and intermittent. It radiates from her mid back downward to bilateral knees. No weakness or numbness. She has pain with walking but otherwise is able to walk. No dysuria, hesitancy, urgency. No injury to the back. Denies fevers or chills. No chest pain. She took naproxen at home with no relief. She was prescribed Gabapentin in the past for neuropathy pain which she also took with no relief. Reports that she had an MRI of her back with Dr. Christella Noa this time last year. No groin numbness. No urinary or fecal incontinence.   Past Medical History:  Diagnosis Date  . Abnormal uterine bleeding   . Anxiety   . Arthritis    knees  . Chronic pelvic pain in female   . Depression   . Endometriosis of pelvis   . Fibromyalgia   . History of ketoacidosis    01-15-2014  . PONV (postoperative nausea and vomiting)   . Sinus tachycardia   . Type 2 diabetes mellitus ([place])   . Wears contact lenses     Patient Active Problem List   Diagnosis Date Noted  . Anemia 07/01/2015  . Sinus tachycardia 05/27/2015  . History of migraine headaches 05/27/2015  . Infection of urinary tract 05/01/2015  . Benign paroxysmal positional vertigo 02/02/2015  . Essential hypertension, benign 02/02/2015  . Primary osteoarthritis of both hips 01/22/2015  . Degenerative disc disease, cervical 01/22/2015  . Hypertriglyceridemia 12/24/2014  . Obesity 07/09/2014  . Fibromyalgia muscle pain 10/07/2013  . Diabetes mellitus type 2 in obese ([place]) 10/07/2013  . Anxiety disorder 10/07/2013  . Endometriosis of pelvis 10/07/2013    Past Surgical History:  Procedure  Laterality Date  . ABDOMINAL HYSTERECTOMY    . BREAST REDUCTION SURGERY  age 28  . CESAREAN SECTION  2002,  2005,  2008  . DILATION AND CURETTAGE OF UTERUS N/A 02/13/2014   Procedure: DILATATION AND CURETTAGE;  Surgeon: Everitt Amber, MD;  Location: [facility];  Service: Gynecology;  Laterality: N/A;  . MANDIBLE SURGERY  age 57   Correct overbite    Current Outpatient Rx  . Order #: [id number] Class: Historical Med  . Order #: [id number] Class: Print  . Order #: [id number] Class: Historical Med  . Order #: [id number] Class: Normal  . Order #: [id number] Class: Normal  . Order #: [id number] Class: Historical Med  . Order #: [id number] Class: Print  . Order #: [id number] Class: Historical Med  . Order #: [id number] Class: Normal  . Order #: [id number] Class: Print  . Order #: [id number] Class: Normal    Allergies Patient has no known allergies.  Family History  Problem Relation Age of Onset  . Adopted: Yes  . Hyperlipidemia Mother   . Hypertension Mother     Social History Social History  Substance Use Topics  . Smoking status: Never Smoker  . Smokeless tobacco: Never Used  . Alcohol use No    Review of Systems  Constitutional: No fever/chills Eyes: No visual changes. ENT: No sore throat. Cardiovascular: Denies chest pain. Respiratory: Denies shortness of breath. Gastrointestinal: No abdominal pain.  No nausea, no vomiting.  No diarrhea.  No constipation. Genitourinary: Negative for dysuria. Musculoskeletal: Positive for  back pain. Skin: Negative for rash. Neurological: Negative for headaches, focal weakness or numbness.  10-point ROS otherwise negative.  ____________________________________________   PHYSICAL EXAM:  VITAL SIGNS: ED Triage Vitals [07/15/16 1449]  Enc Vitals Group     BP 129/90     Pulse Rate (!) 128     Resp (!) 22     Temp 98.2 F (36.8 C)     Temp Source Oral     SpO2 100 %     Weight 290 lb (131.5 kg)     Height 5\' 6"  (1.676 m)     Pain  Score 9   Constitutional: Alert and oriented. Well appearing and in no acute distress. Eyes: Conjunctivae are normal. Head: Atraumatic. Nose: No congestion/rhinnorhea. Mouth/Throat: Mucous membranes are moist.  Oropharynx non-erythematous. Neck: No stridor.  Cardiovascular: Sinus tachycardia. Good peripheral circulation. Grossly normal heart sounds.   Respiratory: Normal respiratory effort.  No retractions. Lungs CTAB. Gastrointestinal: Soft and nontender. No distention.  Musculoskeletal: No lower extremity tenderness nor edema. No gross deformities of extremities. Neurologic:  Normal speech and language. No gross focal neurologic deficits are appreciated. 2+ patellar reflexes. Normal LE sensation.  Skin:  Skin is warm, dry and intact. No rash noted.  ____________________________________________   LABS (all labs ordered are listed, but only abnormal results are displayed)  Labs Reviewed  URINALYSIS, ROUTINE W REFLEX MICROSCOPIC - Abnormal; Notable for the following:       Result Value   APPearance CLOUDY (*)    Specific Gravity, Urine 1.041 (*)    Glucose, UA >=500 (*)    All other components within normal limits  URINALYSIS, MICROSCOPIC (REFLEX) - Abnormal; Notable for the following:    Bacteria, UA FEW (*)    Squamous Epithelial / LPF 6-30 (*)    All other components within normal limits   ____________________________________________  RADIOLOGY  None ____________________________________________   PROCEDURES  Procedure(s) performed:   Procedures  None ____________________________________________   INITIAL IMPRESSION / ASSESSMENT AND PLAN / ED COURSE  Pertinent labs & imaging results that were available during my care of the patient were reviewed by me and considered in my medical decision making (see chart for details).  Patient resents emergency department for evaluation of exacerbation of her back pain. She states this feels different. Normal neurological  exam. She follows with Dr. Christella Noa with Neurosurgery and had an MRI, CT lumbar spine, and myelogram in the last year with no acute findings. She has no red flag signs or symptoms on presentation today to warn intact and emergent MRI. We will focus on symptom management and obtain urinalysis to evaluate for UTI.   Differential diagnosis includes but is not exclusive to musculoskeletal back pain, renal colic, urinary tract infection, pyelonephritis, intra-abdominal causes of back pain, aortic aneurysm or dissection, cauda equina syndrome, sciatica, lumbar disc disease, thoracic disc disease, etc.  Patient feeling better after medication in the ED. She is ambulatory to the bathroom without significant difficulty. Discharged home with Motrin and steroid course. Discussed PCP and NSG f/u PRN.   At this time, I do not feel there is any life-threatening condition present. I have reviewed and discussed all results (EKG, imaging, lab, urine as appropriate), exam findings with patient. I have reviewed nursing notes and appropriate previous records.  I feel the patient is safe to be discharged home without further emergent workup. Discussed usual and customary return precautions. Patient and family (if present) verbalize understanding and are comfortable with this plan.  Patient will  follow-up with their primary care provider. If they do not have a primary care provider, information for follow-up has been provided to them. All questions have been answered.  ____________________________________________  FINAL CLINICAL IMPRESSION(S) / ED DIAGNOSES  Final diagnoses:  Acute midline low back pain without sciatica     MEDICATIONS GIVEN DURING THIS VISIT:  Medications  ketorolac (TORADOL) 30 MG/ML injection 30 mg (30 mg Intramuscular Given 07/15/16 1520)  methocarbamol (ROBAXIN) tablet 500 mg (500 mg Oral Given 07/15/16 1520)  oxyCODONE-acetaminophen (PERCOCET/ROXICET) 5-325 MG per tablet 1 tablet (1 tablet Oral  Given 07/15/16 1537)     NEW OUTPATIENT MEDICATIONS STARTED DURING THIS VISIT:  Discharge Medication List as of 07/15/2016  4:15 PM    START taking these medications   Details  ibuprofen (ADVIL,MOTRIN) 800 MG tablet Take 1 tablet (800 mg total) by mouth every 8 (eight) hours as needed., Starting Fri 07/15/2016, Print    predniSONE (DELTASONE) 10 MG tablet Take 2 tablets (20 mg total) by mouth daily., Starting Fri 07/15/2016, Until Wed 07/20/2016, Print          Note:  This document was prepared using Dragon voice recognition software and may include unintentional dictation errors.  Nanda Quinton, MD Emergency Medicine   Long, [facility], MD 07/16/16 [phone]

## 2016-07-15 NOTE — ED Notes (Signed)
Mid back pain started this morning.  Took Aleve, Gabapentin and heat pack applied with no relief.

## 2016-07-15 NOTE — ED Notes (Signed)
ED Provider at bedside. 

## 2016-07-15 NOTE — ED Triage Notes (Signed)
Patient reports lower back pain which began this morning.  Denies injury.  Denies dysuria, hematuria.

## 2017-01-10 HISTORY — PX: LEFT OOPHORECTOMY: SHX1961

## 2017-07-12 DIAGNOSIS — Z90721 Acquired absence of ovaries, unilateral: Secondary | ICD-10-CM | POA: Insufficient documentation

## 2017-07-12 HISTORY — DX: Acquired absence of ovaries, unilateral: Z90.721

## 2018-02-04 ENCOUNTER — Inpatient Hospital Stay (HOSPITAL_COMMUNITY)
Admission: AD | Admit: 2018-02-04 | Discharge: 2018-02-04 | Disposition: A | Discharge status: Home / Self Care | Payer: Self-pay | Source: Ambulatory Visit | Attending: Obstetrics and Gynecology | Admitting: Obstetrics and Gynecology

## 2018-02-04 ENCOUNTER — Inpatient Hospital Stay (HOSPITAL_COMMUNITY): Payer: Self-pay

## 2018-02-04 ENCOUNTER — Other Ambulatory Visit: Payer: Self-pay

## 2018-02-04 ENCOUNTER — Encounter (HOSPITAL_COMMUNITY): Payer: Self-pay

## 2018-02-04 DIAGNOSIS — N83201 Unspecified ovarian cyst, right side: Secondary | ICD-10-CM | POA: Insufficient documentation

## 2018-02-04 DIAGNOSIS — Z90710 Acquired absence of both cervix and uterus: Secondary | ICD-10-CM | POA: Insufficient documentation

## 2018-02-04 DIAGNOSIS — R102 Pelvic and perineal pain: Secondary | ICD-10-CM | POA: Insufficient documentation

## 2018-02-04 DIAGNOSIS — Z90721 Acquired absence of ovaries, unilateral: Secondary | ICD-10-CM | POA: Insufficient documentation

## 2018-02-04 LAB — URINALYSIS, ROUTINE W REFLEX MICROSCOPIC
Bacteria, UA: NONE SEEN
Bilirubin Urine: NEGATIVE
Glucose, UA: >=500 mg/dL — AB
Hgb urine dipstick: NEGATIVE
Ketones, ur: 20 mg/dL — AB
Leukocytes, UA: NEGATIVE
Nitrite: NEGATIVE
Protein, ur: NEGATIVE mg/dL
Specific Gravity, Urine: 1.029 (ref 1.005–1.030)
pH: 5 (ref 5.0–8.0)

## 2018-02-04 LAB — CBC WITH DIFFERENTIAL/PLATELET
Basophils Absolute: 0 10*3/uL (ref 0.0–0.1)
Basophils Relative: 0 %
Eosinophils Absolute: 0.1 10*3/uL (ref 0.0–0.5)
Eosinophils Relative: 1 %
HCT: 45.5 % (ref 36.0–46.0)
Hemoglobin: 15.9 g/dL — ABNORMAL HIGH (ref 12.0–15.0)
Lymphocytes Relative: 25 %
Lymphs Abs: 1.8 10*3/uL (ref 0.7–4.0)
MCH: 28.3 pg (ref 26.0–34.0)
MCHC: 34.9 g/dL (ref 30.0–36.0)
MCV: 81 fL (ref 80.0–100.0)
Monocytes Absolute: 0.2 10*3/uL (ref 0.1–1.0)
Monocytes Relative: 3 %
Neutro Abs: 5.4 10*3/uL (ref 1.7–7.7)
Neutrophils Relative %: 71 %
Platelets: 192 10*3/uL (ref 150–400)
RBC: 5.62 MIL/uL — ABNORMAL HIGH (ref 3.87–5.11)
RDW: 12.4 % (ref 11.5–15.5)
WBC: 7.5 10*3/uL (ref 4.0–10.5)
nRBC: 0 % (ref 0.0–0.2)

## 2018-02-04 LAB — RAPID URINE DRUG SCREEN, HOSP PERFORMED
Amphetamines: NOT DETECTED
Barbiturates: NOT DETECTED
Benzodiazepines: POSITIVE — AB
Cocaine: NOT DETECTED
Opiates: NOT DETECTED
Tetrahydrocannabinol: NOT DETECTED

## 2018-02-04 LAB — GLUCOSE, CAPILLARY: Glucose-Capillary: 296 mg/dL — ABNORMAL HIGH (ref 70–99)

## 2018-02-04 MED ORDER — ONDANSETRON 8 MG PO TBDP
8.0000 mg | ORAL_TABLET | Freq: Once | ORAL | Status: AC
  Administered 2018-02-04: 8 mg via ORAL
  Filled 2018-02-04: qty 1

## 2018-02-04 MED ORDER — NAPROXEN 500 MG PO TABS: 500.0000 mg | ORAL_TABLET | Freq: Two times a day (BID) | ORAL | 0 refills | Status: DC

## 2018-02-04 MED ORDER — NAPROXEN 500 MG PO TABS: 500.0000 mg | ORAL_TABLET | Freq: Once | ORAL | Status: DC

## 2018-02-04 MED ORDER — HYDROCODONE-ACETAMINOPHEN 5-325 MG PO TABS
2.0000 | ORAL_TABLET | Freq: Once | ORAL | Status: AC
  Administered 2018-02-04: 2 via ORAL
  Filled 2018-02-04: qty 2

## 2018-02-04 NOTE — Discharge Instructions (Signed)
Ovarian Cyst  An ovarian cyst is a fluid-filled sac on an ovary. The ovaries are organs that make eggs in women. Most ovarian cysts go away on their own and are not cancerous (are benign). Some cysts need treatment.  Follow these instructions at home:   Take over-the-counter and prescription medicines only as told by your doctor.   Do not drive or use heavy machinery while taking prescription pain medicine.   Get pelvic exams and Pap tests as often as told by your doctor.   Return to your normal activities as told by your doctor. Ask your doctor what activities are safe for you.   Do not use any products that contain nicotine or tobacco, such as cigarettes and e-cigarettes. If you need help quitting, ask your doctor.   Keep all follow-up visits as told by your doctor. This is important.  Contact a doctor if:   Your periods are:  ? Late.  ? Irregular.  ? Painful.     Your periods stop.   You have pelvic pain that does not go away.   You have pressure on your bladder.   You have trouble making your bladder empty when you pee (urinate).   You have pain during sex.   You have any of the following in your belly (abdomen):  ? A feeling of fullness.  ? Pressure.  ? Discomfort.  ? Pain that does not go away.  ? Swelling.   You feel sick most of the time.   You have trouble pooping (have constipation).   You are not as hungry as usual (you lose your appetite).   You get very bad acne.   You start to have more hair on your body and face.   You are gaining weight or losing weight without changing your exercise and eating habits.   You think you may be pregnant.  Get help right away if:   You have belly pain that is very bad or gets worse.   You cannot eat or drink without throwing up (vomiting).   You suddenly get a fever.   Your period is a lot heavier than usual.  This information is not intended to replace advice given to you by your health care provider. Make sure you discuss any questions you have  with your health care provider.  Document Released: 06/15/2007 Document Revised: 07/17/2015 Document Reviewed: 05/31/2015  Elsevier Interactive Patient Education  2019 [employer]vier Inc[employer]

## 2018-02-04 NOTE — MAU Note (Addendum)
Christine Grant is a 43 y.o. here in MAU reporting:  Increased right sided abdominal pain, was at an ED and they told her she had an 8cm cyst on her right ovary. Left ovary was removed over the summer. Has tried tylenol and heat packs with no relief  N/V for 2 days, no diarrhea  On Keflex for UTI since Wednesday   LMP: hx of hysterectomy  Onset of complaint: Wednesday  Vitals:   02/04/18 0820  BP: 139/90  Pulse: (!) 138  Resp: 18  Temp: 97.9 F (36.6 C)      Lab orders placed from triage: UA

## 2018-02-04 NOTE — MAU Provider Note (Signed)
History     CSN: [id number]  Arrival date and time: 02/04/18 0806   First Provider Initiated Contact with Patient 02/04/18 [phone]      Chief Complaint  Patient presents with  . Pelvic Pain  . Nausea  . Emesis   Christine Grant is a 43 y.o. [id number] who is here today with worsening right sided pain. Patient has a hx of hysterectomy and left oophorectomy. She was seen at [facility] for this on 01/31/18, and found to have a right ovarian cyst. She has a FU appt at [facility] on 02/14/18, but was told to come here for evaluation due to worsening pain. She is taking Keflex 500mg  BID X7 days for a UTI.   US PELVIS TRANSVAGINAL WITH DOPPLER Performed 01/31/18 at [facility]: Pelvis Pain COMPARISON: 12/04/2017 TECHNIQUE:  Grayscale and color Doppler ultrasound was performed of the pelvis via transvaginal and transabdominal approach. Spectral analysis was also performed. FINDINGS:  UTERUS: Status post hysterectomy. RIGHT OVARY: - Size: 6.5 x 4.7 x 5.3 cm.  - There is a complex cystic structure measuring 4.8 x 4.2 x 4 cm suggesting hemorrhagic cyst. Follow-up is suggested. Adjacent cystic area seen measuring 3.6 x 1.8 x 1.6 cm. Both cysts could represent 1 multicystic structure with septations.  - Normal arterial and venous waveforms. LEFT OVARY: Surgically absent. BLADDER: - Unremarkable.  MISCELLANEOUS: - No free fluid in the pelvis.   Pelvic Pain  The patient's primary symptoms include pelvic pain. The patient's pertinent negatives include no vaginal discharge. This is a new problem. The current episode started in the past 7 days. The problem occurs constantly. The problem has been gradually worsening. Pain severity now: 9/10. The problem affects the right side. Associated symptoms include nausea and vomiting (1-2x per day started on 02/03/18). Pertinent negatives include no chills, constipation, diarrhea, dysuria, fever or frequency. The vaginal discharge was normal. There has been no  bleeding. Exacerbated by: sneezing, coughing, standing up straight. She has tried heating pads and acetaminophen for the symptoms. The treatment provided no relief. She is not sexually active. She uses hysterectomy for contraception. Her past medical history is significant for a gynecological surgery and ovarian cysts.  Emesis   This is a new problem. The current episode started yesterday. The problem occurs less than 2 times per day. The problem has been unchanged. The emesis has an appearance of stomach contents. There has been no fever. Pertinent negatives include no chills, diarrhea or fever. She has tried nothing for the symptoms.    Past Medical History:  Diagnosis Date  . Abnormal uterine bleeding   . Anxiety   . Arthritis    knees  . Chronic pelvic pain in female   . Depression   . Endometriosis of pelvis   . Fibromyalgia   . History of ketoacidosis    01-15-2014  . PONV (postoperative nausea and vomiting)   . Sinus tachycardia   . Type 2 diabetes mellitus ([place])   . Wears contact lenses     Past Surgical History:  Procedure Laterality Date  . ABDOMINAL HYSTERECTOMY    . BREAST REDUCTION SURGERY  age 31  . CESAREAN SECTION  2002,  2005,  2008  . DILATION AND CURETTAGE OF UTERUS N/A 02/13/2014   Procedure: DILATATION AND CURETTAGE;  Surgeon: Everitt Amber, MD;  Location: [facility];  Service: Gynecology;  Laterality: N/A;  . LEFT OOPHORECTOMY  2019  . MANDIBLE SURGERY  age 23   Correct overbite    Family  History  Adopted: Yes  Problem Relation Age of Onset  . Hyperlipidemia Mother   . Hypertension Mother     Social History   Tobacco Use  . Smoking status: Never Smoker  . Smokeless tobacco: Never Used  Substance Use Topics  . Alcohol use: No  . Drug use: No    Allergies:  Allergies  Allergen Reactions  . Toradol [Ketorolac Tromethamine] Other (See Comments)    Chest tightness, flushed     Medications Prior to Admission  Medication Sig  Dispense Refill Last Dose  . ALPRAZolam (XANAX) 1 MG tablet TAKE 1 TABLET BY MOUTH TWICE A DAY AS NEEDED 60 tablet 0 02/03/2018 at Unknown time  . Dulaglutide (TRULICITY) 1.5 PO/2.4MP SOPN Inject 1.5 mg into the skin once a week. 4 pen 11 Past Week at Unknown time  . gabapentin (NEURONTIN) 600 MG tablet Take 600-1,200 mg by mouth 3 (three) times daily.   02/03/2018 at Unknown time  . zolpidem (AMBIEN CR) 6.25 MG CR tablet Take 6.25 mg by mouth at bedtime as needed for sleep.   02/03/2018 at Unknown time  . cyclobenzaprine (FLEXERIL) 10 MG tablet Take 10 mg by mouth 3 (three) times daily as needed for muscle spasms.   Unknown at Unknown time  . fenofibrate 160 MG tablet TAKE 1 TABLET (160 MG TOTAL) BY MOUTH DAILY. 90 tablet 3 Unknown at Unknown time  . ibuprofen (ADVIL,MOTRIN) 800 MG tablet Take 1 tablet (800 mg total) by mouth every 8 (eight) hours as needed. 21 tablet 0 Unknown at Unknown time  . metoprolol tartrate (LOPRESSOR) 50 MG tablet Take 50 mg by mouth 2 (two) times daily.   Unknown at Unknown time  . naproxen sodium (ALEVE) 220 MG tablet Take 440 mg by mouth 2 (two) times daily as needed.   Unknown at Unknown time  . ondansetron (ZOFRAN-ODT) 8 MG disintegrating tablet TAKE 1 TABLET (8 MG TOTAL) BY MOUTH EVERY 8 (EIGHT) HOURS AS NEEDED FOR NAUSEA. 20 tablet 3 Unknown at Unknown time  . XIGDUO XR 10-998 MG TB24 TAKE 1 TABLET BY MOUTH DAILY 30 tablet 8 Unknown at Unknown time    Review of Systems  Constitutional: Negative for chills and fever.  Gastrointestinal: Positive for nausea and vomiting (1-2x per day started on 02/03/18). Negative for constipation and diarrhea.  Genitourinary: Positive for pelvic pain. Negative for dysuria, frequency, vaginal bleeding and vaginal discharge.   Physical Exam   Blood pressure 139/90, pulse (!) 138, temperature 97.9 F (36.6 C), temperature source Oral, resp. rate 18, height 5\' 6"  (1.676 m), weight 121.3 kg.  Physical Exam  Nursing note and vitals  reviewed. Constitutional: She is oriented to person, place, and time. She appears well-developed and well-nourished. No distress.  HENT:  Head: Normocephalic.  Cardiovascular: Normal rate.  Respiratory: Effort normal.  GI: Soft. There is abdominal tenderness. There is no rebound.  Neurological: She is alert and oriented to person, place, and time.  Skin: Skin is warm and dry.  Psychiatric: She has a normal mood and affect.   Results for orders placed or performed during the hospital encounter of 02/04/18 (from the past 24 hour(s))  Urinalysis, Routine w reflex microscopic     Status: Abnormal   Collection Time: 02/04/18  8:28 AM  Result Value Ref Range   Color, Urine YELLOW YELLOW   APPearance CLEAR CLEAR   Specific Gravity, Urine 1.029 1.005 - 1.030   pH 5.0 5.0 - 8.0   Glucose, UA >=500 (A) NEGATIVE mg/dL  Hgb urine dipstick NEGATIVE NEGATIVE   Bilirubin Urine NEGATIVE NEGATIVE   Ketones, ur 20 (A) NEGATIVE mg/dL   Protein, ur NEGATIVE NEGATIVE mg/dL   Nitrite NEGATIVE NEGATIVE   Leukocytes, UA NEGATIVE NEGATIVE   RBC / HPF 0-5 0 - 5 RBC/hpf   WBC, UA 0-5 0 - 5 WBC/hpf   Bacteria, UA NONE SEEN NONE SEEN   Squamous Epithelial / LPF 0-5 0 - 5   Mucus PRESENT   Urine rapid drug screen (hosp performed)     Status: Abnormal   Collection Time: 02/04/18  8:28 AM  Result Value Ref Range   Opiates NONE DETECTED NONE DETECTED   Cocaine NONE DETECTED NONE DETECTED   Benzodiazepines POSITIVE (A) NONE DETECTED   Amphetamines NONE DETECTED NONE DETECTED   Tetrahydrocannabinol NONE DETECTED NONE DETECTED   Barbiturates NONE DETECTED NONE DETECTED  CBC with Differential/Platelet     Status: Abnormal   Collection Time: 02/04/18  9:40 AM  Result Value Ref Range   WBC 7.5 4.0 - 10.5 K/uL   RBC 5.62 (H) 3.87 - 5.11 MIL/uL   Hemoglobin 15.9 (H) 12.0 - 15.0 g/dL   HCT 45.5 36.0 - 46.0 %   MCV 81.0 80.0 - 100.0 fL   MCH 28.3 26.0 - 34.0 pg   MCHC 34.9 30.0 - 36.0 g/dL   RDW 12.4 11.5 -  15.5 %   Platelets 192 150 - 400 K/uL   nRBC 0.0 0.0 - 0.2 %   Neutrophils Relative % 71 %   Neutro Abs 5.4 1.7 - 7.7 K/uL   Lymphocytes Relative 25 %   Lymphs Abs 1.8 0.7 - 4.0 K/uL   Monocytes Relative 3 %   Monocytes Absolute 0.2 0.1 - 1.0 K/uL   Eosinophils Relative 1 %   Eosinophils Absolute 0.1 0.0 - 0.5 K/uL   Basophils Relative 0 %   Basophils Absolute 0.0 0.0 - 0.1 K/uL  Glucose, capillary     Status: Abnormal   Collection Time: 02/04/18 12:10 PM  Result Value Ref Range   Glucose-Capillary 296 (H) 70 - 99 mg/dL   US Pelvic Complete W Transvaginal And Torsion R/o  Result Date: 02/04/2018 CLINICAL DATA:  Pelvic pain. 8 cm right ovarian cyst diagnosed elsewhere. Status post hysterectomy and left oophorectomy. Nausea, vomiting for the past 2 days. History of endometriosis and diabetes. EXAM: TRANSABDOMINAL AND TRANSVAGINAL ULTRASOUND OF PELVIS DOPPLER ULTRASOUND OF OVARIES TECHNIQUE: Both transabdominal and transvaginal ultrasound examinations of the pelvis were performed. Transabdominal technique was performed for global imaging of the pelvis including uterus, ovaries, adnexal regions, and pelvic cul-de-sac. It was necessary to proceed with endovaginal exam following the transabdominal exam to visualize the right ovary in better detail. Color and duplex Doppler ultrasound was utilized to evaluate blood flow to the ovaries. COMPARISON:  Pelvic ultrasound report from [facility], dated 01/31/2018 FINDINGS: Uterus Surgically absent. Right ovary Measurements: 7.8 x 6.4 x 5.9 cm = volume: 155 mL. 6.4 x 5.8 x 4.7 cm cyst containing multiple thin internal septations and membranes with low-level internal echoes. No internal blood flow seen with color Doppler. By report, this measured 4.8 x 4.2 x 4.0 cm on 01/31/2018. There is a smaller adjacent simple cyst measuring 3.2 x 1.8 cm in the transverse plane with similar measurements on the report dated 12/02/2018. Normal blood flow in the ovarian  tissue at color Doppler. Left ovary Surgically absent. Pulsed Doppler evaluation of the right ovary demonstrates normal low-resistance arterial and venous waveforms. Other findings Small amount  of free peritoneal fluid. IMPRESSION: 1. By report, there has been a mild increase in size of a probable hemorrhagic right ovarian cyst, currently measuring 6.4 x 5.8 x 4.7 cm. An endometrioma could also have this appearance. 2. By report, no significant change in a 3.2 cm adjacent simple appearing right ovarian cyst. 3. No evidence of ovarian torsion. 4. Status post hysterectomy and left oophorectomy. 5. Small amount of free peritoneal fluid. Electronically Signed   By: Claudie Revering M.D.   On: 02/04/2018 13:00   MAU Course  Procedures  MDM Patient has had vicodin and zofran. She reports that her pain and nausea have improved.    Assessment and Plan   1. Cyst of right ovary   2. Pelvic pain    DC home Comfort measures reviewed  RX: naproxen 500mg  BID PRN  Return to MAU/ED as needed FU with GYN as planned  Follow-up Information    Janyth Pupa, DO Follow up.   Specialty:  Obstetrics and Gynecology Why:  keep your appointment as scheduled  Contact information: [address] [place] [address] [phone]             Mirela Parsley DNP, CNM  02/04/18  12:49 PM

## 2018-05-01 DIAGNOSIS — I10 Essential (primary) hypertension: Secondary | ICD-10-CM | POA: Diagnosis not present

## 2018-05-01 DIAGNOSIS — R51 Headache: Secondary | ICD-10-CM | POA: Diagnosis not present

## 2018-05-01 DIAGNOSIS — H53149 Visual discomfort, unspecified: Secondary | ICD-10-CM | POA: Diagnosis not present

## 2018-05-01 DIAGNOSIS — F419 Anxiety disorder, unspecified: Secondary | ICD-10-CM | POA: Diagnosis not present

## 2018-05-01 DIAGNOSIS — Z79899 Other long term (current) drug therapy: Secondary | ICD-10-CM | POA: Diagnosis not present

## 2018-05-01 DIAGNOSIS — Z888 Allergy status to other drugs, medicaments and biological substances status: Secondary | ICD-10-CM | POA: Diagnosis not present

## 2018-05-01 DIAGNOSIS — E119 Type 2 diabetes mellitus without complications: Secondary | ICD-10-CM | POA: Diagnosis not present

## 2018-05-01 DIAGNOSIS — M5136 Other intervertebral disc degeneration, lumbar region: Secondary | ICD-10-CM | POA: Diagnosis not present

## 2018-05-01 DIAGNOSIS — R110 Nausea: Secondary | ICD-10-CM | POA: Diagnosis not present

## 2018-05-27 DIAGNOSIS — R109 Unspecified abdominal pain: Secondary | ICD-10-CM | POA: Diagnosis not present

## 2018-05-27 DIAGNOSIS — Z79899 Other long term (current) drug therapy: Secondary | ICD-10-CM | POA: Diagnosis not present

## 2018-05-27 DIAGNOSIS — R0602 Shortness of breath: Secondary | ICD-10-CM | POA: Diagnosis not present

## 2018-05-27 DIAGNOSIS — R000 Tachycardia, unspecified: Secondary | ICD-10-CM | POA: Diagnosis not present

## 2018-05-27 DIAGNOSIS — I10 Essential (primary) hypertension: Secondary | ICD-10-CM | POA: Diagnosis not present

## 2018-05-27 DIAGNOSIS — E1165 Type 2 diabetes mellitus with hyperglycemia: Secondary | ICD-10-CM | POA: Diagnosis not present

## 2018-05-27 DIAGNOSIS — R197 Diarrhea, unspecified: Secondary | ICD-10-CM | POA: Diagnosis not present

## 2018-05-27 DIAGNOSIS — K760 Fatty (change of) liver, not elsewhere classified: Secondary | ICD-10-CM | POA: Diagnosis not present

## 2018-05-27 DIAGNOSIS — M797 Fibromyalgia: Secondary | ICD-10-CM | POA: Diagnosis not present

## 2018-05-27 DIAGNOSIS — Z886 Allergy status to analgesic agent status: Secondary | ICD-10-CM | POA: Diagnosis not present

## 2018-05-27 DIAGNOSIS — R079 Chest pain, unspecified: Secondary | ICD-10-CM | POA: Diagnosis not present

## 2018-05-27 DIAGNOSIS — F419 Anxiety disorder, unspecified: Secondary | ICD-10-CM | POA: Diagnosis not present

## 2018-05-27 DIAGNOSIS — K5900 Constipation, unspecified: Secondary | ICD-10-CM | POA: Diagnosis not present

## 2018-06-25 ENCOUNTER — Encounter: Payer: Self-pay | Admitting: Family Medicine

## 2018-06-25 ENCOUNTER — Ambulatory Visit: Payer: BC Managed Care – PPO | Admitting: Family Medicine

## 2018-06-25 VITALS — BP 143/101 | HR 121 | Temp 98.2°F | Wt 272.0 lb

## 2018-06-25 DIAGNOSIS — E1169 Type 2 diabetes mellitus with other specified complication: Secondary | ICD-10-CM

## 2018-06-25 DIAGNOSIS — Z8669 Personal history of other diseases of the nervous system and sense organs: Secondary | ICD-10-CM

## 2018-06-25 DIAGNOSIS — E669 Obesity, unspecified: Secondary | ICD-10-CM

## 2018-06-25 DIAGNOSIS — E781 Pure hyperglyceridemia: Secondary | ICD-10-CM | POA: Diagnosis not present

## 2018-06-25 DIAGNOSIS — M797 Fibromyalgia: Secondary | ICD-10-CM

## 2018-06-25 DIAGNOSIS — F411 Generalized anxiety disorder: Secondary | ICD-10-CM

## 2018-06-25 DIAGNOSIS — L989 Disorder of the skin and subcutaneous tissue, unspecified: Secondary | ICD-10-CM

## 2018-06-25 LAB — POCT GLYCOSYLATED HEMOGLOBIN (HGB A1C): Hemoglobin A1C: 12.5 % — AB (ref 4.0–5.6)

## 2018-06-25 MED ORDER — TRIAMCINOLONE ACETONIDE 0.5 % EX CREA: 1.0000 "application " | TOPICAL_CREAM | Freq: Two times a day (BID) | CUTANEOUS | 3 refills | Status: DC

## 2018-06-25 MED ORDER — METFORMIN HCL ER 500 MG PO TB24: 1000.0000 mg | ORAL_TABLET | Freq: Two times a day (BID) | ORAL | 1 refills | Status: DC

## 2018-06-25 MED ORDER — FARXIGA 10 MG PO TABS: 10.0000 mg | ORAL_TABLET | Freq: Every day | ORAL | 1 refills | Status: DC

## 2018-06-25 MED ORDER — LISINOPRIL 10 MG PO TABS: 10.0000 mg | ORAL_TABLET | Freq: Every day | ORAL | 1 refills | Status: DC

## 2018-06-25 NOTE — Patient Instructions (Addendum)
Thank you for coming in today. Start farxiga and metformin.  When you restart metformin start at 1 pill a day and increase to 2 pills twice daily over about 2 weeks.  Start lisinopril for kidney protection and blood pressure.  Recheck in 1 month.  When you need refills for medicine that I have not filled yet please send me a mychart note with the dose and protocol and pharmacy.   Apply the triamcinolone cream to the spot on the leg.

## 2018-06-25 NOTE — Progress Notes (Signed)
Christine Grant is a 43 y.o. female who presents to [place]: [facility] today for establish care.  Patient has been seen in the clinic previously but then transferred care to [facility] physicians at [facility] and now is transferring care back to me.  I care for her husband as well as some of her children.  She has a history of diabetes.  She currently takes Trulicity.  In the past she had prescription for Xigduo but has not been taking it.  She notes her blood sugars are typically in the 200s to 300s.  She is no longer eating a careful low carbohydrate diet.  She drinks lots of sugar sweetened beverages.  Additionally she has a history of hypertension but is not taking any medications now.  She previously had been taking metformin.  No chest pain palpitations shortness of breath.  Patient has anxiety.  She uses Xanax which does help.  She also has insomnia and uses Ambien.  She has a prescription for tramadol that she uses for breakthrough migraines at times which also helps her symptoms as well.  History of frequent abdominal pain.  She has a history of multiple abdominal adhesions due to multiple different abdominal surgeries.  Skin lesion right calf: Present for about 1 year.  No injury.  Somewhat scaly.  Tried some over-the-counter creams which helped a little.   ROS as above:  Exam:  BP (!) 143/101   Pulse (!) 121   Temp 98.2 F (36.8 C) (Oral)   Wt 272 lb (123.4 kg)   LMP  (LMP Unknown)   BMI 43.90 kg/m  Wt Readings from Last 5 Encounters:  06/25/18 272 lb (123.4 kg)  02/04/18 267 lb 8 oz (121.3 kg)  07/15/16 290 lb (131.5 kg)  10/02/15 280 lb (127 kg)  07/21/15 283 lb (128.4 kg)    Gen: Well NAD HEENT: EOMI,  MMM Lungs: Normal work of breathing. CTABL Heart: RRR no MRG Abd: NABS, Soft. Nondistended, Nontender Exts: Brisk capillary refill, warm and well perfused.   Skin: Small erythematous patch with some scale right calf.  Nontender.  No surrounding induration or erythema. Psych alert and oriented normal speech thought process and affect.  Depression screen PHQ 2/9 06/25/2018  Decreased Interest 0  Down, Depressed, Hopeless 1  PHQ - 2 Score 1  Altered sleeping 0  Tired, decreased energy 1  Change in appetite 1  Feeling bad or failure about yourself  1  Trouble concentrating 0  Moving slowly or fidgety/restless 1  Suicidal thoughts 0  PHQ-9 Score 5  Difficult doing work/chores Somewhat difficult    GAD 7 : Generalized Anxiety Score 06/25/2018  Nervous, Anxious, on Edge 3  Control/stop worrying 2  Worry too much - different things 2  Trouble relaxing 1  Restless 0  Easily annoyed or irritable 2  Afraid - awful might happen 0  Total GAD 7 Score 10  Anxiety Difficulty Somewhat difficult      Lab and Radiology Results Results for orders placed or performed in visit on 06/25/18 (from the past 72 hour(s))  POCT HgB A1C     Status: Abnormal   Collection Time: 06/25/18 11:30 AM  Result Value Ref Range   Hemoglobin A1C 12.5 (A) 4.0 - 5.6 %   HbA1c POC (<> result, manual entry)     HbA1c, POC (prediabetic range)     HbA1c, POC (controlled diabetic range)     No results found.  Assessment and Plan: 43 y.o. female with  Diabetes: Not controlled.  Continue Trulicity and add [place] and metformin.  Maximize doses of both.  Additionally work on [employer] and recheck in 1 month.  Will obtain medical records from previous doctor's office.  Hypertension: Blood pressure bit elevated.  Plan to start lisinopril as this will also with kidney function.  Start lisinopril 10 and recheck in 1 month.  Will check labs then.  Hyperlipidemia hypertriglyceridemia: We will check labs in near future.  Skin lesion: Unclear etiology.  Trial of triamcinolone cream.  Recheck 1 month.  Anxiety: Managed with Xanax.  Continue low-dose Xanax as needed.   Insomnia: Also continue low-dose Ambien.  Will try to get off of sedative hypnotics if possible.  Migraine: Intermittent tramadol.  Not really excited about continuing his medication but will use while trying to wean off of tramadol if possible.  PDMP reviewed during this encounter. Orders Placed This Encounter  Procedures  . POCT HgB A1C   Meds ordered this encounter  Medications  . lisinopril (ZESTRIL) 10 MG tablet    Sig: Take 1 tablet (10 mg total) by mouth daily.    Dispense:  90 tablet    Refill:  1  . dapagliflozin propanediol (FARXIGA) 10 MG TABS tablet    Sig: Take 10 mg by mouth daily.    Dispense:  90 tablet    Refill:  1  . metFORMIN (GLUCOPHAGE XR) 500 MG 24 hr tablet    Sig: Take 2 tablets (1,000 mg total) by mouth 2 (two) times daily.    Dispense:  360 tablet    Refill:  1  . triamcinolone cream (KENALOG) 0.5 %    Sig: Apply 1 application topically 2 (two) times daily. To affected areas.    Dispense:  30 g    Refill:  3     Historical information moved to improve visibility of documentation.  Past Medical History:  Diagnosis Date  . Abnormal uterine bleeding   . Anxiety   . Arthritis    knees  . Chronic pelvic pain in female   . Depression   . Endometriosis of pelvis   . Fibromyalgia   . History of ketoacidosis    01-15-2014  . PONV (postoperative nausea and vomiting)   . Sinus tachycardia   . Type 2 diabetes mellitus ([place])   . Wears contact lenses    Past Surgical History:  Procedure Laterality Date  . ABDOMINAL HYSTERECTOMY    . BREAST REDUCTION SURGERY  age 89  . CESAREAN SECTION  2002,  2005,  2008  . DILATION AND CURETTAGE OF UTERUS N/A 02/13/2014   Procedure: DILATATION AND CURETTAGE;  Surgeon: Everitt Amber, MD;  Location: [facility];  Service: Gynecology;  Laterality: N/A;  . LEFT OOPHORECTOMY  2019  . MANDIBLE SURGERY  age 49   Correct overbite   Social History   Tobacco Use  . Smoking status: Never Smoker  . Smokeless  tobacco: Never Used  Substance Use Topics  . Alcohol use: No   family history includes Hyperlipidemia in her mother; Hypertension in her mother. She was adopted.  Medications: Current Outpatient Medications  Medication Sig Dispense Refill  . gabapentin (NEURONTIN) 300 MG capsule Take 900 mg by mouth 3 (three) times daily.    . traMADol (ULTRAM) 50 MG tablet 1-2 tabs by mouth Q8 hours, maximum 6 tabs per day.    . zolpidem (AMBIEN CR) 12.5 MG CR tablet Take 12.5 mg by  mouth at bedtime as needed.    . ALPRAZolam (XANAX) 1 MG tablet TAKE 1 TABLET BY MOUTH TWICE A DAY AS NEEDED 60 tablet 0  . cyclobenzaprine (FLEXERIL) 10 MG tablet Take 10 mg by mouth 3 (three) times daily as needed for muscle spasms.    . dapagliflozin propanediol (FARXIGA) 10 MG TABS tablet Take 10 mg by mouth daily. 90 tablet 1  . Dulaglutide (TRULICITY) 1.5 LX/7.2IO SOPN Inject 1.5 mg into the skin once a week. 4 pen 11  . lisinopril (ZESTRIL) 10 MG tablet Take 1 tablet (10 mg total) by mouth daily. 90 tablet 1  . metFORMIN (GLUCOPHAGE XR) 500 MG 24 hr tablet Take 2 tablets (1,000 mg total) by mouth 2 (two) times daily. 360 tablet 1  . naproxen (NAPROSYN) 500 MG tablet Take 1 tablet (500 mg total) by mouth 2 (two) times daily. 30 tablet 0  . ondansetron (ZOFRAN-ODT) 8 MG disintegrating tablet TAKE 1 TABLET (8 MG TOTAL) BY MOUTH EVERY 8 (EIGHT) HOURS AS NEEDED FOR NAUSEA. 20 tablet 3  . triamcinolone cream (KENALOG) 0.5 % Apply 1 application topically 2 (two) times daily. To affected areas. 30 g 3   No current facility-administered medications for this visit.    Allergies  Allergen Reactions  . Toradol [Ketorolac Tromethamine] Other (See Comments)    Chest tightness, flushed      Discussed warning signs or symptoms. Please see discharge instructions. Patient expresses understanding.

## 2018-07-05 ENCOUNTER — Encounter: Payer: Self-pay | Admitting: Family Medicine

## 2018-07-06 MED ORDER — TRAMADOL HCL 50 MG PO TABS: 50.0000 mg | ORAL_TABLET | Freq: Every day | ORAL | 0 refills | Status: DC | PRN

## 2018-07-17 ENCOUNTER — Ambulatory Visit (INDEPENDENT_AMBULATORY_CARE_PROVIDER_SITE_OTHER): Payer: BC Managed Care – PPO | Admitting: Family Medicine

## 2018-07-17 ENCOUNTER — Encounter: Payer: Self-pay | Admitting: Family Medicine

## 2018-07-17 ENCOUNTER — Encounter (INDEPENDENT_AMBULATORY_CARE_PROVIDER_SITE_OTHER): Payer: Self-pay

## 2018-07-17 VITALS — HR 90 | Temp 99.8°F | Ht 66.0 in | Wt 272.0 lb

## 2018-07-17 DIAGNOSIS — Z20828 Contact with and (suspected) exposure to other viral communicable diseases: Secondary | ICD-10-CM

## 2018-07-17 DIAGNOSIS — Z20822 Contact with and (suspected) exposure to covid-19: Secondary | ICD-10-CM

## 2018-07-17 DIAGNOSIS — N3000 Acute cystitis without hematuria: Secondary | ICD-10-CM

## 2018-07-17 MED ORDER — CEFDINIR 300 MG PO CAPS: 300.0000 mg | ORAL_CAPSULE | Freq: Two times a day (BID) | ORAL | 0 refills | Status: DC

## 2018-07-17 NOTE — Progress Notes (Signed)
Virtual Visit  via Video Note  I connected with      Christine Grant by a video enabled telemedicine application and verified that I am speaking with the correct person using two identifiers.   I discussed the limitations of evaluation and management by telemedicine and the availability of in person appointments. The patient expressed understanding and agreed to proceed.  History of Present Illness: Christine Grant is a 43 y.o. female who would like to discuss muscle aches and pains.  Starting on Friday July 10th Christine Grant developed muscle aches and pains and chills.  She denies measurable fever.  She notes nausea but no vomiting.  She also notes urinary symptoms including urinary frequency urgency and dysuria.  She notes her symptoms are somewhat consistent with prior UTI.  She denies any cough or shortness of breath.  She denies any known sick contacts with COVID-19.    Observations/Objective: Pulse 90   Temp 99.8 F (37.7 C) (Oral)   Ht 5\' 6"  (1.676 m)   Wt 272 lb (123.4 kg)   LMP  (LMP Unknown)   BMI 43.90 kg/m  Wt Readings from Last 5 Encounters:  07/17/18 272 lb (123.4 kg)  06/25/18 272 lb (123.4 kg)  02/04/18 267 lb 8 oz (121.3 kg)  07/15/16 290 lb (131.5 kg)  10/02/15 280 lb (127 kg)   Exam: Appearance nontoxic no acute distress Normal Speech.    Lab and Radiology Results No results found for this or any previous visit (from the past 72 hour(s)). No results found.   Assessment and Plan: 43 y.o. female with chills mild back pain and body aches and urinary frequency urgency dysuria.  Likely UTI.  Plan for empiric treatment with Omnicef.  However symptoms could also be COVID-19 related.  Plan for outpatient COVID-19 test with self-isolation until test results are negative.  Precautions reviewed.  Patient expresses understanding and agreement.  PDMP not reviewed this encounter. No orders of the defined types were placed in this encounter.  Meds ordered this encounter   Medications  . cefdinir (OMNICEF) 300 MG capsule    Sig: Take 1 capsule (300 mg total) by mouth 2 (two) times daily.    Dispense:  14 capsule    Refill:  0    Follow Up Instructions:    I discussed the assessment and treatment plan with the patient. The patient was provided an opportunity to ask questions and all were answered. The patient agreed with the plan and demonstrated an understanding of the instructions.   The patient was advised to call back or seek an in-person evaluation if the symptoms worsen or if the condition fails to improve as anticipated.  Time: 15 minutes of intraservice time, with >22 minutes of total time during today's visit.      Historical information moved to improve visibility of documentation.  Past Medical History:  Diagnosis Date  . Abnormal uterine bleeding   . Anxiety   . Arthritis    knees  . Chronic pelvic pain in female   . Depression   . Endometriosis of pelvis   . Fibromyalgia   . History of ketoacidosis    01-15-2014  . PONV (postoperative nausea and vomiting)   . Sinus tachycardia   . Type 2 diabetes mellitus ([place])   . Wears contact lenses    Past Surgical History:  Procedure Laterality Date  . ABDOMINAL HYSTERECTOMY    . BREAST REDUCTION SURGERY  age 61  . CESAREAN SECTION  2002,  2005,  2008  . DILATION AND CURETTAGE OF UTERUS N/A 02/13/2014   Procedure: DILATATION AND CURETTAGE;  Surgeon: Everitt Amber, MD;  Location[facility];  Service: Gynecology;  Laterality: N/A;  . LEFT OOPHORECTOMY  2019  . MANDIBLE SURGERY  age 21   Correct overbite   Social History   Tobacco Use  . Smoking status: Never Smoker  . Smokeless tobacco: Never Used  Substance Use Topics  . Alcohol use: No   family history includes Hyperlipidemia in her mother; Hypertension in her mother. She was adopted.  Medications: Current Outpatient Medications  Medication Sig Dispense Refill  . ALPRAZolam (XANAX) 1 MG tablet TAKE 1 TABLET BY  MOUTH TWICE A DAY AS NEEDED 60 tablet 0  . cyclobenzaprine (FLEXERIL) 10 MG tablet Take 10 mg by mouth 3 (three) times daily as needed for muscle spasms.    . dapagliflozin propanediol (FARXIGA) 10 MG TABS tablet Take 10 mg by mouth daily. 90 tablet 1  . Dulaglutide (TRULICITY) 1.5 BJ/6.2GB SOPN Inject 1.5 mg into the skin once a week. 4 pen 11  . gabapentin (NEURONTIN) 300 MG capsule Take 900 mg by mouth 3 (three) times daily.    Marland Kitchen lisinopril (ZESTRIL) 10 MG tablet Take 1 tablet (10 mg total) by mouth daily. 90 tablet 1  . metFORMIN (GLUCOPHAGE XR) 500 MG 24 hr tablet Take 2 tablets (1,000 mg total) by mouth 2 (two) times daily. 360 tablet 1  . traMADol (ULTRAM) 50 MG tablet Take 1 tablet (50 mg total) by mouth daily as needed. 30 tablet 0  . triamcinolone cream (KENALOG) 0.5 % Apply 1 application topically 2 (two) times daily. To affected areas. 30 g 3  . zolpidem (AMBIEN CR) 12.5 MG CR tablet Take 12.5 mg by mouth at bedtime as needed.    . cefdinir (OMNICEF) 300 MG capsule Take 1 capsule (300 mg total) by mouth 2 (two) times daily. 14 capsule 0   No current facility-administered medications for this visit.    Allergies  Allergen Reactions  . Toradol [Ketorolac Tromethamine] Other (See Comments)    Chest tightness, flushed

## 2018-07-17 NOTE — Progress Notes (Signed)
Fever started Friday night. Muscle aches, nausea. No SOB/cough/sore throat/congestion.  That night had sweats. Fever around 100 degrees. Can not work due to fever (Covid precautions).

## 2018-07-18 ENCOUNTER — Telehealth: Payer: Self-pay | Admitting: *Deleted

## 2018-07-18 ENCOUNTER — Encounter (INDEPENDENT_AMBULATORY_CARE_PROVIDER_SITE_OTHER): Payer: Self-pay

## 2018-07-18 NOTE — Telephone Encounter (Signed)
Contacted pt, and she states that her symptoms are due to Metformin; she had an episode of diarrhea last pm;  pt advised to continue monitoring her symptoms; she verbalized understanding.

## 2018-07-18 NOTE — Telephone Encounter (Signed)
Attempted to contact pt due to my chart companion response 07/18/2018 of new diarrhea; left message on voicemail for pt to return call to [phone].

## 2018-07-18 NOTE — Telephone Encounter (Signed)
Patient called back  Call back [phone]

## 2018-07-19 ENCOUNTER — Encounter: Payer: Self-pay | Admitting: Family Medicine

## 2018-07-19 ENCOUNTER — Encounter (INDEPENDENT_AMBULATORY_CARE_PROVIDER_SITE_OTHER): Payer: Self-pay

## 2018-07-20 ENCOUNTER — Encounter (INDEPENDENT_AMBULATORY_CARE_PROVIDER_SITE_OTHER): Payer: Self-pay

## 2018-07-20 ENCOUNTER — Telehealth: Payer: Self-pay | Admitting: *Deleted

## 2018-07-20 DIAGNOSIS — Z20822 Contact with and (suspected) exposure to covid-19: Secondary | ICD-10-CM

## 2018-07-20 NOTE — Telephone Encounter (Signed)
Pt has been scheduled for covid testing.  ° °

## 2018-07-20 NOTE — Telephone Encounter (Signed)
Pt called but no answer at this time. Left message for pt to return call to schedule testing.

## 2018-07-20 NOTE — Addendum Note (Signed)
Addended by: Dimple Nanas on: 07/20/2018 04:27 PM   Modules accepted: Orders

## 2018-07-20 NOTE — Telephone Encounter (Signed)
-----   Message from Gregor Hams, MD sent at 07/17/2018  2:11 PM EDT ----- Regarding: Needs COVID test Need outpaitent COVID test

## 2018-07-21 ENCOUNTER — Encounter (INDEPENDENT_AMBULATORY_CARE_PROVIDER_SITE_OTHER): Payer: Self-pay

## 2018-07-22 ENCOUNTER — Encounter (INDEPENDENT_AMBULATORY_CARE_PROVIDER_SITE_OTHER): Payer: Self-pay

## 2018-07-23 ENCOUNTER — Encounter (INDEPENDENT_AMBULATORY_CARE_PROVIDER_SITE_OTHER): Payer: Self-pay

## 2018-07-23 ENCOUNTER — Other Ambulatory Visit: Payer: BC Managed Care – PPO

## 2018-07-23 ENCOUNTER — Encounter: Payer: Self-pay | Admitting: Family Medicine

## 2018-07-23 DIAGNOSIS — R6889 Other general symptoms and signs: Secondary | ICD-10-CM | POA: Diagnosis not present

## 2018-07-23 DIAGNOSIS — Z20822 Contact with and (suspected) exposure to covid-19: Secondary | ICD-10-CM

## 2018-07-24 ENCOUNTER — Encounter (INDEPENDENT_AMBULATORY_CARE_PROVIDER_SITE_OTHER): Payer: Self-pay

## 2018-07-24 ENCOUNTER — Telehealth: Payer: Self-pay | Admitting: Family Medicine

## 2018-07-24 NOTE — Telephone Encounter (Signed)
Received medical records from [facility] at [facility] however it was mostly just some notes over the last year.  No labs or mammogram or Pap smear etc. was sent.  Will send second request.

## 2018-07-25 ENCOUNTER — Encounter (INDEPENDENT_AMBULATORY_CARE_PROVIDER_SITE_OTHER): Payer: Self-pay

## 2018-07-25 ENCOUNTER — Ambulatory Visit: Payer: BC Managed Care – PPO | Admitting: Family Medicine

## 2018-07-27 ENCOUNTER — Encounter (INDEPENDENT_AMBULATORY_CARE_PROVIDER_SITE_OTHER): Payer: Self-pay

## 2018-07-27 LAB — NOVEL CORONAVIRUS, NAA: SARS-CoV-2, NAA: NOT DETECTED

## 2018-08-06 ENCOUNTER — Encounter: Payer: Self-pay | Admitting: Family Medicine

## 2018-08-06 ENCOUNTER — Ambulatory Visit (INDEPENDENT_AMBULATORY_CARE_PROVIDER_SITE_OTHER): Payer: BC Managed Care – PPO | Admitting: Family Medicine

## 2018-08-06 ENCOUNTER — Other Ambulatory Visit: Payer: Self-pay

## 2018-08-06 VITALS — BP 132/87 | HR 146 | Temp 97.9°F | Wt 270.0 lb

## 2018-08-06 DIAGNOSIS — E1159 Type 2 diabetes mellitus with other circulatory complications: Secondary | ICD-10-CM

## 2018-08-06 DIAGNOSIS — M797 Fibromyalgia: Secondary | ICD-10-CM

## 2018-08-06 DIAGNOSIS — I10 Essential (primary) hypertension: Secondary | ICD-10-CM

## 2018-08-06 DIAGNOSIS — I152 Hypertension secondary to endocrine disorders: Secondary | ICD-10-CM

## 2018-08-06 DIAGNOSIS — R000 Tachycardia, unspecified: Secondary | ICD-10-CM | POA: Diagnosis not present

## 2018-08-06 DIAGNOSIS — L989 Disorder of the skin and subcutaneous tissue, unspecified: Secondary | ICD-10-CM

## 2018-08-06 DIAGNOSIS — E669 Obesity, unspecified: Secondary | ICD-10-CM

## 2018-08-06 DIAGNOSIS — E1169 Type 2 diabetes mellitus with other specified complication: Secondary | ICD-10-CM

## 2018-08-06 MED ORDER — METOPROLOL SUCCINATE ER 25 MG PO TB24: 25.0000 mg | ORAL_TABLET | Freq: Every day | ORAL | 1 refills | Status: DC

## 2018-08-06 MED ORDER — TRAMADOL HCL 50 MG PO TABS: 50.0000 mg | ORAL_TABLET | Freq: Every day | ORAL | 0 refills | Status: DC | PRN

## 2018-08-06 NOTE — Progress Notes (Signed)
Christine Grant is a 43 y.o. female who presents to [place]: [place] today for sinus tachycardia, diabetes, urinary tract infection, skin lesion.  Sinus tachycardia: Patient notes a long history of sinus tachycardia.  She notes usually her heart rate is around 110 or less.  She is feeling a little bit nervous today about her appointment and thinks it may be the cause.  She denies any lightheadedness dizziness chest pain or palpitations.  She notes in the past she has been on metoprolol and tolerated it pretty well.  Diabetes: Currently taking metformin 500 twice daily, Farxiga, and Trulicity.  She notes her blood sugars are still elevated typically in the low 200s.  She drinks lots of sugar sweetened beverages and has trouble quitting.  She is working on improving her diet lifestyle thinks her blood sugars are improving however.  UTI: Patient had UTI in the interval.  She was treated with [facility] and feels a lot better now.  She denies any urinary frequency urgency or dysuria.  Chronic pain doing well tramadol.  Skin lesion: Patient continues to experience a macular hyperpigmented skin lesion on her right shin for about a year.  She is tried antifungal and anti-inflammatory medication with no benefit.  ROS as above:  Exam:  BP 132/87   Pulse (!) 146   Temp 97.9 F (36.6 C) (Oral)   Wt 270 lb (122.5 kg)   LMP  (LMP Unknown)   BMI 43.58 kg/m  Wt Readings from Last 5 Encounters:  08/06/18 270 lb (122.5 kg)  07/17/18 272 lb (123.4 kg)  06/25/18 272 lb (123.4 kg)  02/04/18 267 lb 8 oz (121.3 kg)  07/15/16 290 lb (131.5 kg)    Gen: Well NAD HEENT: EOMI,  MMM Lungs: Normal work of breathing. CTABL Heart: RRR no MRG Abd: NABS, Soft. Nondistended, Nontender Exts: Brisk capillary refill, warm and well perfused.  Skin: Macular erythematous dime size lesion right shin.    Lab and Radiology Results Twelve-lead EKG: Sinus tachycardia rate 139.  No ST segment elevation or depression.  No Q waves.  QTc 441.  Normal axis.   Assessment and Plan: 43 y.o. female with  Sinus tachycardia: Unclear etiology likely chronic issue.  Slightly worsened today.  Plan to check metabolic panel TSH and CBC as below.  Will treat with metoprolol as well.  Plan for 25 mg extended release daily and titrate from there..  Recheck in 1 month.  Diabetes: Not well controlled.  Significant discussion regarding diet.  Continue current regimen and recheck in 1 month if blood sugar still elevated significant at that point we will likely start insulin.  Refill tramadol.  Watchful waiting for UTI.  Currently asymptomatic if becoming recurrent will discontinue [place].  Skin lesion: Plan for biopsy next month.  Would like to avoid biopsy with tachycardia today.  PDMP reviewed during this encounter. Orders Placed This Encounter  Procedures  . COMPLETE METABOLIC PANEL WITH GFR  . CBC  . TSH   Meds ordered this encounter  Medications  . metoprolol succinate (TOPROL-XL) 25 MG 24 hr tablet    Sig: Take 1 tablet (25 mg total) by mouth daily.    Dispense:  90 tablet    Refill:  1  . traMADol (ULTRAM) 50 MG tablet    Sig: Take 1 tablet (50 mg total) by mouth daily as needed.    Dispense:  30 tablet    Refill:  0  Chronic pain management     Historical information moved to improve visibility of documentation.  Past Medical History:  Diagnosis Date  . Abnormal uterine bleeding   . Anxiety   . Arthritis    knees  . Chronic pelvic pain in female   . Depression   . Endometriosis of pelvis   . Fibromyalgia   . History of ketoacidosis    01-15-2014  . PONV (postoperative nausea and vomiting)   . Sinus tachycardia   . Type 2 diabetes mellitus ([place])   . Wears contact lenses    Past Surgical History:  Procedure Laterality Date  . ABDOMINAL HYSTERECTOMY    . BREAST REDUCTION  SURGERY  age 61  . CESAREAN SECTION  2002,  2005,  2008  . DILATION AND CURETTAGE OF UTERUS N/A 02/13/2014   Procedure: DILATATION AND CURETTAGE;  Surgeon: Everitt Amber, MD;  Location: [facility];  Service: Gynecology;  Laterality: N/A;  . LEFT OOPHORECTOMY  2019  . MANDIBLE SURGERY  age 35   Correct overbite   Social History   Tobacco Use  . Smoking status: Never Smoker  . Smokeless tobacco: Never Used  Substance Use Topics  . Alcohol use: No   family history includes Hyperlipidemia in her mother; Hypertension in her mother. She was adopted.  Medications: Current Outpatient Medications  Medication Sig Dispense Refill  . ALPRAZolam (XANAX) 1 MG tablet TAKE 1 TABLET BY MOUTH TWICE A DAY AS NEEDED 60 tablet 0  . cyclobenzaprine (FLEXERIL) 10 MG tablet Take 10 mg by mouth 3 (three) times daily as needed for muscle spasms.    . dapagliflozin propanediol (FARXIGA) 10 MG TABS tablet Take 10 mg by mouth daily. 90 tablet 1  . Dulaglutide (TRULICITY) 1.5 GT/3.6IW SOPN Inject 1.5 mg into the skin once a week. 4 pen 11  . gabapentin (NEURONTIN) 300 MG capsule Take 900 mg by mouth 3 (three) times daily.    Marland Kitchen lisinopril (ZESTRIL) 10 MG tablet Take 1 tablet (10 mg total) by mouth daily. 90 tablet 1  . metFORMIN (GLUCOPHAGE XR) 500 MG 24 hr tablet Take 2 tablets (1,000 mg total) by mouth 2 (two) times daily. 360 tablet 1  . traMADol (ULTRAM) 50 MG tablet Take 1 tablet (50 mg total) by mouth daily as needed. 30 tablet 0  . triamcinolone cream (KENALOG) 0.5 % Apply 1 application topically 2 (two) times daily. To affected areas. 30 g 3  . zolpidem (AMBIEN CR) 12.5 MG CR tablet Take 12.5 mg by mouth at bedtime as needed.    . metoprolol succinate (TOPROL-XL) 25 MG 24 hr tablet Take 1 tablet (25 mg total) by mouth daily. 90 tablet 1   No current facility-administered medications for this visit.    Allergies  Allergen Reactions  . Toradol [Ketorolac Tromethamine] Other (See Comments)     Chest tightness, flushed      Discussed warning signs or symptoms. Please see discharge instructions. Patient expresses understanding.

## 2018-08-06 NOTE — Patient Instructions (Signed)
Thank you for coming in today. Get labs today.  Work on low [employer].  Try to get less than 50 g per meal.  If blood sugar still high will start insulin.  As for skin lesion next month when heart is better controlled on metoprolol we will do a biopsy.  Do not treat with cream until then if you can.   Start metoprolol.  Let me know if your hear rate does not improve.   Call or go to the emergency room if you get worse, have trouble breathing, have chest pains, or palpitations.   Call or go to the emergency room if you get worse, have trouble breathing, have chest pains, or palpitations.

## 2018-08-07 LAB — COMPLETE METABOLIC PANEL WITH GFR
AG Ratio: 1.6 (calc) (ref 1.0–2.5)
ALT: 99 U/L — ABNORMAL HIGH (ref 6–29)
AST: 25 U/L (ref 10–30)
Albumin: 4.6 g/dL (ref 3.6–5.1)
Alkaline phosphatase (APISO): 101 U/L (ref 31–125)
BUN: 14 mg/dL (ref 7–25)
CO2: 22 mmol/L (ref 20–32)
Calcium: 10.6 mg/dL — ABNORMAL HIGH (ref 8.6–10.2)
Chloride: 96 mmol/L — ABNORMAL LOW (ref 98–110)
Creat: 0.63 mg/dL (ref 0.50–1.10)
GFR, Est African American: 128 mL/min/{1.73_m2} (ref >=60)
GFR, Est Non African American: 111 mL/min/{1.73_m2} (ref >=60)
Globulin: 2.8 g/dL (calc) (ref 1.9–3.7)
Glucose, Bld: 272 mg/dL — ABNORMAL HIGH (ref 65–99)
Potassium: 4.5 mmol/L (ref 3.5–5.3)
Sodium: 135 mmol/L (ref 135–146)
Total Bilirubin: 0.6 mg/dL (ref 0.2–1.2)
Total Protein: 7.4 g/dL (ref 6.1–8.1)

## 2018-08-07 LAB — TSH: TSH: 1.06 mIU/L

## 2018-08-07 LAB — CBC
HCT: 47.6 % — ABNORMAL HIGH (ref 35.0–45.0)
Hemoglobin: 16 g/dL — ABNORMAL HIGH (ref 11.7–15.5)
MCH: 27.7 pg (ref 27.0–33.0)
MCHC: 33.6 g/dL (ref 32.0–36.0)
MCV: 82.5 fL (ref 80.0–100.0)
MPV: 10.8 fL (ref 7.5–12.5)
Platelets: 268 10*3/uL (ref 140–400)
RBC: 5.77 10*6/uL — ABNORMAL HIGH (ref 3.80–5.10)
RDW: 14 % (ref 11.0–15.0)
WBC: 7.5 10*3/uL (ref 3.8–10.8)

## 2018-08-14 ENCOUNTER — Encounter: Payer: Self-pay | Admitting: Family Medicine

## 2018-08-14 ENCOUNTER — Telehealth: Payer: Self-pay | Admitting: Family Medicine

## 2018-08-14 NOTE — Telephone Encounter (Signed)
Receive notes from [facility].  Abstracted labs from 2018.

## 2018-08-20 ENCOUNTER — Encounter: Payer: Self-pay | Admitting: Family Medicine

## 2018-08-20 NOTE — Telephone Encounter (Signed)
RX pended, last written by historical provider

## 2018-08-21 MED ORDER — CYCLOBENZAPRINE HCL 10 MG PO TABS: 10.0000 mg | ORAL_TABLET | Freq: Three times a day (TID) | ORAL | 1 refills | Status: DC | PRN

## 2018-09-03 ENCOUNTER — Encounter: Payer: Self-pay | Admitting: Family Medicine

## 2018-09-03 ENCOUNTER — Ambulatory Visit (INDEPENDENT_AMBULATORY_CARE_PROVIDER_SITE_OTHER): Payer: BC Managed Care – PPO | Admitting: Family Medicine

## 2018-09-03 ENCOUNTER — Other Ambulatory Visit: Payer: Self-pay

## 2018-09-03 ENCOUNTER — Other Ambulatory Visit: Payer: Self-pay | Admitting: Family Medicine

## 2018-09-03 VITALS — BP 126/82 | HR 139 | Temp 98.5°F | Wt 265.0 lb

## 2018-09-03 DIAGNOSIS — E1159 Type 2 diabetes mellitus with other circulatory complications: Secondary | ICD-10-CM | POA: Diagnosis not present

## 2018-09-03 DIAGNOSIS — R102 Pelvic and perineal pain: Secondary | ICD-10-CM

## 2018-09-03 DIAGNOSIS — IMO0002 Reserved for concepts with insufficient information to code with codable children: Secondary | ICD-10-CM

## 2018-09-03 DIAGNOSIS — I10 Essential (primary) hypertension: Secondary | ICD-10-CM

## 2018-09-03 DIAGNOSIS — E1169 Type 2 diabetes mellitus with other specified complication: Secondary | ICD-10-CM

## 2018-09-03 DIAGNOSIS — N803 Endometriosis of pelvic peritoneum: Secondary | ICD-10-CM

## 2018-09-03 DIAGNOSIS — N390 Urinary tract infection, site not specified: Secondary | ICD-10-CM | POA: Diagnosis not present

## 2018-09-03 DIAGNOSIS — R000 Tachycardia, unspecified: Secondary | ICD-10-CM

## 2018-09-03 DIAGNOSIS — N83201 Unspecified ovarian cyst, right side: Secondary | ICD-10-CM

## 2018-09-03 DIAGNOSIS — F411 Generalized anxiety disorder: Secondary | ICD-10-CM

## 2018-09-03 DIAGNOSIS — E781 Pure hyperglyceridemia: Secondary | ICD-10-CM | POA: Diagnosis not present

## 2018-09-03 DIAGNOSIS — I152 Hypertension secondary to endocrine disorders: Secondary | ICD-10-CM

## 2018-09-03 DIAGNOSIS — E669 Obesity, unspecified: Secondary | ICD-10-CM

## 2018-09-03 MED ORDER — METOPROLOL SUCCINATE ER 50 MG PO TB24: 50.0000 mg | ORAL_TABLET | Freq: Every day | ORAL | 1 refills | Status: DC

## 2018-09-03 NOTE — Progress Notes (Signed)
Christine Grant is a 43 y.o. female who presents to [facility]: [place] today for follow-up on sinus tachycardia. Patient has a complicated medical Hx including GAD, Type-2 diabetes, endometriosis, and morbid obesity.  Tachycardia: HR is 139 today. Patient reports usual home measurements in the 110s. She is tolerating the Metoprolol well. Notes occasional monthly orthostatic lightheadedness. Denies syncope, chest pain, SOB, visual changes.  Type-2 Diabetes: Patient reports average blood sugars around 260, worse in the morning. She is trying to control what she eats but likes sugary foods. She spends some time outside a couple times a week working in her barn but has limited physical activity. She just started taking the max dose of Metformin twice a day for the last 2 weeks.  Abd Pain: Patient reports ongoing abd pain worsening over the last month to moderate. It is localized to her RLQ. She has a Hx of partial hysterectomy and left oophorectomy due to endometriosis. She is interested in a local referral to OB/GYN to evaluate and consider right oophorectomy. She takes a max does of Tylenol every day and periodically takes Tramadol for pain.  UTI Symptoms: Patient notes occasional feelings of urinary urgency. She felt unwell yesterday.  GAD: Patient has ongoing challenges with anxiety. Her son committed suicide a couple of years ago which was obviously very challenging. Her two daughters have anxiety. Husband has bipolar disorder. She says that she gets stressed about random things that should not trigger her such as her daughters fussing with each other. She says she has had more trouble recently with her husband's bipolar disorder acting up. He has recently resumed counseling which is helping. She is seeing a Social worker as well. She says she wakes up panicked weekly. She takes Xanax daily and  Ambien around 3 times a week.  Skin Plaque on Right Leg: Patient has dark-colored plaque on her lower leg. Denies recent changes or itchiness. She has a Hx of psoriasis.   ROS as above:  Exam:  BP 126/82    Pulse (!) 139    Temp 98.5 F (36.9 C) (Oral)    Wt 265 lb (120.2 kg)    LMP  (LMP Unknown)    BMI 42.77 kg/m  Wt Readings from Last 5 Encounters:  09/03/18 265 lb (120.2 kg)  08/06/18 270 lb (122.5 kg)  07/17/18 272 lb (123.4 kg)  06/25/18 272 lb (123.4 kg)  02/04/18 267 lb 8 oz (121.3 kg)    Gen: Well NAD, morbidly obese. HEENT: EOMI,  MMM Lungs: Normal work of breathing. CTABL Heart: Regular tachycardia. No MRG Abd: NABS, Soft. Diffusely tender to palpation, worse over the RLQ. Mild CVA tenderness. Exts: Brisk capillary refill, warm and well perfused. Rapid radial pulses BL. Skin: Small erythematous macular pitted area right calf approximately 1.2 cm.  GAD 7 : Generalized Anxiety Score 09/03/2018 06/25/2018  Nervous, Anxious, on Edge 2 3  Control/stop worrying 0 2  Worry too much - different things 1 2  Trouble relaxing 0 1  Restless 0 0  Easily annoyed or irritable 2 2  Afraid - awful might happen 0 0  Total GAD 7 Score 5 10  Anxiety Difficulty Somewhat difficult Somewhat difficult    Depression screen [facility] 2/9 09/03/2018 06/25/2018  Decreased Interest 1 0  Down, Depressed, Hopeless 1 1  PHQ - 2 Score 2 1  Altered sleeping 0 0  Tired, decreased energy 3 1  Change in appetite 1 1  Feeling bad or failure about yourself  0 1  Trouble concentrating 0 0  Moving slowly or fidgety/restless 0 1  Suicidal thoughts 0 0  PHQ-9 Score 6 5  Difficult doing work/chores Somewhat difficult Somewhat difficult     Lab and Radiology Results No results found for this or any previous visit (from the past 72 hour(s)). No results found.    Assessment and Plan: 43 y.o. female with a complicated medical Hx including GAD, Type-2 diabetes, endometriosis, and morbid obesity is  following up on sinus tachycardia.  Tachycardia: HR is 139 today. Patient reports usual home measurements in the 110s. She is tolerating the Metoprolol well. Notes occasional monthly orthostatic lightheadedness. Denies syncope, chest pain, SOB, visual changes. Increasing Metoprolol to 50mg  daily.  Type-2 Diabetes: Patient reports average blood sugars around 260, worse in the morning. She is trying to control what she eats but likes sugary foods. She spends some time outside a couple times a week working in her barn but has limited physical activity. She just started taking the max dose of Metformin twice a day for the last 2 weeks. Counseled that starting long-acting insulin is likely a necessary step to control her diabetes.  Abd Pain: Patient reports ongoing abd pain worsening over the last month to moderate. It is localized to her RLQ. She has a Hx of partial hysterectomy and left oophorectomy due to endometriosis. She is interested in a local referral to OB/GYN to evaluate and consider right oophorectomy which I obliged.  UTI Symptoms: Patient notes occasional feelings of urinary urgency. She felt unwell yesterday and has mild BL CVA tenderness. Ordered urinalysis and culture.  GAD: Patient has ongoing challenges with anxiety. Her son committed suicide a couple of years ago which was obviously very challenging. Her two daughters have anxiety. Husband has bipolar disorder. She says that she gets stressed about random things that should not trigger her such as her daughters fussing with each other. She says she has had more trouble recently with her husband's bipolar disorder acting up. He has recently resumed counseling which is helping. She is seeing a Social worker as well. She says she wakes up panicked weekly. She takes Xanax daily and Ambien around 3 times a week. Continue monitoring for improvement with counseling.  Skin Plaque on Right Leg: Patient has dark-colored plaque on her lower leg. Denies  recent changes or itchiness. She has a Hx of psoriasis. Told patient to schedule appointment to perform biopsy.  PDMP not reviewed this encounter. Orders Placed This Encounter  Procedures   Urine Culture   Urinalysis, Routine w reflex microscopic   Ambulatory referral to Obstetrics / Gynecology    Referral Priority:   Routine    Referral Type:   Consultation    Referral Reason:   Specialty Services Required    Requested Specialty:   Obstetrics and Gynecology    Number of Visits Requested:   1   Meds ordered this encounter  Medications   metoprolol succinate (TOPROL-XL) 50 MG 24 hr tablet    Sig: Take 1 tablet (50 mg total) by mouth daily.    Dispense:  90 tablet    Refill:  1     Historical information moved to improve visibility of documentation.  Past Medical History:  Diagnosis Date   Abnormal uterine bleeding    Anxiety    Arthritis    knees   Chronic pelvic pain in female    Depression    Endometriosis of pelvis  Fibromyalgia    History of ketoacidosis    01-15-2014   PONV (postoperative nausea and vomiting)    Sinus tachycardia    Type 2 diabetes mellitus ([place])    Wears contact lenses    Past Surgical History:  Procedure Laterality Date   ABDOMINAL HYSTERECTOMY     BREAST REDUCTION SURGERY  age 72   CESAREAN SECTION  2002,  2005,  2008   DILATION AND CURETTAGE OF UTERUS N/A 02/13/2014   Procedure: DILATATION AND CURETTAGE;  Surgeon: Everitt Amber, MD;  Location: [facility];  Service: Gynecology;  Laterality: N/A;   LEFT OOPHORECTOMY  2019   MANDIBLE SURGERY  age 54   Correct overbite   Social History   Tobacco Use   Smoking status: Never Smoker   Smokeless tobacco: Never Used  Substance Use Topics   Alcohol use: No   family history includes Hyperlipidemia in her mother; Hypertension in her mother. She was adopted.  Medications: Current Outpatient Medications  Medication Sig Dispense Refill   ALPRAZolam  (XANAX) 1 MG tablet TAKE 1 TABLET BY MOUTH TWICE A DAY AS NEEDED 60 tablet 0   cyclobenzaprine (FLEXERIL) 10 MG tablet Take 1 tablet (10 mg total) by mouth 3 (three) times daily as needed for muscle spasms. 270 tablet 1   dapagliflozin propanediol (FARXIGA) 10 MG TABS tablet Take 10 mg by mouth daily. 90 tablet 1   Dulaglutide (TRULICITY) 1.5 0000000 SOPN Inject 1.5 mg into the skin once a week. 4 pen 11   gabapentin (NEURONTIN) 300 MG capsule Take 900 mg by mouth 3 (three) times daily.     lisinopril (ZESTRIL) 10 MG tablet Take 1 tablet (10 mg total) by mouth daily. 90 tablet 1   metFORMIN (GLUCOPHAGE XR) 500 MG 24 hr tablet Take 2 tablets (1,000 mg total) by mouth 2 (two) times daily. 360 tablet 1   metoprolol succinate (TOPROL-XL) 50 MG 24 hr tablet Take 1 tablet (50 mg total) by mouth daily. 90 tablet 1   traMADol (ULTRAM) 50 MG tablet Take 1 tablet (50 mg total) by mouth daily as needed. 30 tablet 0   triamcinolone cream (KENALOG) 0.5 % Apply 1 application topically 2 (two) times daily. To affected areas. 30 g 3   zolpidem (AMBIEN CR) 12.5 MG CR tablet Take 12.5 mg by mouth at bedtime as needed.     No current facility-administered medications for this visit.    Allergies  Allergen Reactions   Toradol [Ketorolac Tromethamine] Other (See Comments)    Chest tightness, flushed      Discussed warning signs or symptoms. Please see discharge instructions. Patient expresses understanding.  I personally was present and performed or re-performed the history, physical exam and medical decision-making activities of this service and have verified that the service and findings are accurately documented in the student's note. ___________________________________________ Lynne Leader M.D., ABFM., CAQSM. Primary Care and Sports Medicine Adjunct Instructor of [place] [employer]

## 2018-09-03 NOTE — Patient Instructions (Addendum)
Thank you for coming in today. You should hear about the referral to obgyn soon.  I will await urine results.  Keep track of blood sugar and work to reduce your oral carbs.   Increase metoprolol to 50mg . Try taking at bedtime if you can.   Recheck in 1 month.   Schedule dedicated skin biopsy visit in near future.    I will be moving to full time Sports Medicine in [place] starting on November 1st.  You will still be able to see me for your Sports Medicine or Orthopedic needs in the [facility] Location. I will still be part of [place].    [address], [place] [address]  [phone]  Telephone (phone line will be functional starting in November).  [phone] Fax [phone] Concussion Line  If you want to stay locally for your Sports Medicine issues Dr. Dianah Field here in [place] will be happy to see you.  Additionally Dr. Clearance Coots at [facility] will be happy to see you for sports medicine issues more locally.   For your primary care needs you are welcome to establish care with Dr. Emeterio Reeve.  We are working quickly to hire more physicians to cover the primary care needs however if you cannot get an appointment with Dr. Sheppard Coil in a timely manner [facility] has locations and openings for primary care services nearby.   [facility] at [facility] [address] . [employer] [place] , [place]: [phone] . Behavioral Medicine: [phone] . Fax: [place] at [employer] [address] . [place], [place]: [phone] . Behavioral Medicine: [phone] . Fax: [phone] . Hours (M-F): 7am - Academic librarian At [facility]. [place], [place]: [phone] . Behavioral Medicine: [phone] . Fax: [phone] . Hours (M-F): 8am - Optician, dispensing at  [employer] . [place], [place] Phone: [phone] . Behavioral Medicine: [phone] . Fax: [phone]

## 2018-09-04 ENCOUNTER — Encounter: Payer: Self-pay | Admitting: Family Medicine

## 2018-09-04 MED ORDER — CEFDINIR 300 MG PO CAPS: 300.0000 mg | ORAL_CAPSULE | Freq: Two times a day (BID) | ORAL | 0 refills | Status: DC

## 2018-09-04 MED ORDER — PROMETHAZINE HCL 25 MG PO TABS: 25.0000 mg | ORAL_TABLET | Freq: Four times a day (QID) | ORAL | 2 refills | Status: DC | PRN

## 2018-09-04 NOTE — Addendum Note (Signed)
Addended by: Gregor Hams on: 09/04/2018 06:20 AM   Modules accepted: Orders

## 2018-09-05 LAB — URINALYSIS, ROUTINE W REFLEX MICROSCOPIC
Bilirubin Urine: NEGATIVE
Hgb urine dipstick: NEGATIVE
Hyaline Cast: NONE SEEN /LPF
Nitrite: NEGATIVE
Protein, ur: NEGATIVE
Specific Gravity, Urine: 1.031 (ref 1.001–1.03)
pH: <=5 (ref 5.0–8.0)

## 2018-09-05 LAB — URINE CULTURE
MICRO NUMBER:: 803674
SPECIMEN QUALITY:: ADEQUATE

## 2018-09-06 ENCOUNTER — Encounter: Payer: Self-pay | Admitting: Family Medicine

## 2018-09-11 ENCOUNTER — Encounter: Payer: Self-pay | Admitting: Family Medicine

## 2018-09-11 ENCOUNTER — Ambulatory Visit: Payer: BC Managed Care – PPO | Admitting: Family Medicine

## 2018-09-11 ENCOUNTER — Other Ambulatory Visit: Payer: Self-pay

## 2018-09-11 VITALS — BP 139/88 | HR 116 | Temp 98.2°F | Wt 267.0 lb

## 2018-09-11 DIAGNOSIS — L989 Disorder of the skin and subcutaneous tissue, unspecified: Secondary | ICD-10-CM

## 2018-09-11 DIAGNOSIS — L929 Granulomatous disorder of the skin and subcutaneous tissue, unspecified: Secondary | ICD-10-CM | POA: Diagnosis not present

## 2018-09-11 MED ORDER — ALPRAZOLAM 1 MG PO TABS: 1.0000 mg | ORAL_TABLET | Freq: Two times a day (BID) | ORAL | 5 refills | Status: DC | PRN

## 2018-09-11 NOTE — Patient Instructions (Addendum)
Thank you for coming in today. You should hear about pathology report soon.  Return in 10 days for nurse visit for sutire removal.   Sutured Wound Care Sutures are stitches that can be used to close wounds. Taking care of your wound properly can help to prevent pain and infection. It can also help your wound to heal more quickly. Follow instructions from your health care provider about how to care for your sutured wound. Supplies needed:  Soap and water.  A clean bandage (dressing), if needed.  Antibiotic ointment.  A clean towel. How to care for your sutured wound   Keep the wound completely dry for the first 24 hours, or for as long as directed by your health care provider. After 24-48 hours, you may shower or bathe as directed by your health care provider. Do not soak or submerge the wound in water until the sutures have been removed.  After the first 24 hours, clean the wound once a day, or as often as directed by your health care provider, using the following steps: ? Wash the wound with soap and water. ? Rinse the wound with water to remove all soap. ? Pat the wound dry with a clean towel. Do not rub the wound.  After cleaning the wound, apply a thin layer of antibiotic ointment as directed by your health care provider. This will prevent infection and keep the dressing from sticking to the wound.  Follow instructions from your health care provider about how to change your dressing: ? Wash your hands with soap and water. If soap and water are not available, use hand sanitizer. ? Change your dressing at least once a day, or as often as told by your health care provider. If your dressing gets wet or dirty, change it. ? Leave sutures and other skin closures, such as adhesive tape or skin glue, in place. These skin closures may need to stay in place for 2 weeks or longer. If adhesive strip edges start to loosen and curl up, you may trim the loose edges. Do not remove adhesive strips  completely unless your health care provider tells you to do that.  Check your wound every day for signs of infection. Watch for: ? Redness, swelling, or pain. ? Fluid or blood. ? Warmth. ? Pus or a bad smell.  Have the sutures removed as directed by your health care provider. Follow these instructions at home: Medicines  Take or apply over-the-counter and prescription medicines only as told by your health care provider.  If you were prescribed an antibiotic medicine or ointment, take or apply it as told by your health care provider. Do not stop using the antibiotic even if your condition improves. General instructions  To help reduce scarring after your wound heals, cover your wound with clothing or apply sunscreen of at least 30 SPF whenever you are outside.  Do not scratch or pick at your wound.  Avoid stretching your wound.  Raise (elevate) the injured area above the level of your heart while you are sitting or lying down, if possible.  Drink enough fluids to keep your urine clear or pale yellow.  Keep all follow-up visits as told by your health care provider. This is important. Contact a health care provider if:  You received a tetanus shot and you have swelling, severe pain, redness, or bleeding at the injection site.  Your wound breaks open.  You have redness, swelling, or pain around your wound.  You have fluid or  blood coming from your wound.  Your wound feels warm to the touch.  You have a fever.  You notice something coming out of your wound, such as wood or glass.  You have pain that does not get better with medicine.  The skin near your wound changes color.  You need to change your dressing very frequently due to a lot of fluid, blood, or pus draining from the wound.  You develop a new rash.  You develop numbness around the wound. Get help right away if:  You develop severe swelling around your wound.  You have pus or a bad smell coming from your  wound.  Your pain suddenly gets worse and is severe.  You develop painful lumps near your wound or anywhere on your body.  You have a red streak going away from your wound.  The wound is on your hand or foot and: ? You cannot properly move a finger or toe. ? Your fingers or toes look pale or bluish. ? You have numbness that is spreading down your hand, foot, fingers, or toes. Summary  Sutures are stitches that can be used to close wounds.  Taking care of your wound properly can help to prevent pain and infection.  Keep the wound completely dry for the first 24 hours, or for as long as directed by your health care provider. After 24-48 hours, you may shower or bathe as directed by your health care provider. This information is not intended to replace advice given to you by your health care provider. Make sure you discuss any questions you have with your health care provider. Document Released: 02/04/2004 Document Revised: 12/09/2016 Document Reviewed: 02/02/2016 Elsevier Patient Education  2020 [employer].

## 2018-09-11 NOTE — Progress Notes (Signed)
Patient presents to clinic today for previously arranged skin biopsy right lower extremity.  Skin lesion present on right lower leg for about 6 months.  Patient has had trials of topical steroids and oral antibiotics and topical antibiotics and antifungals with no significant improvement. Skin lesion measures 1.2 cm  Excisional skin biopsy: Consent obtained and timeout performed. Skin cleaned with rubbing alcohol and cold spray applied. 10 L of 1% lidocaine without epinephrine injected subcutaneously achieving good anesthesia. Epinephrine not used given mild tachycardia. Skin was sterilized with chlorhexidine and draped in the usual sterile fashion. Elliptical incision along skin tension lines used.  1 mm margins along the lesion. The ellipse was undermined to the subcutaneous tissue.  The entire elliptical block of skin with was removed. The skin edges were undermined using dissection. A horizontal mattress suture was used to take most of the tension along the midpoint.  2 more horizontal mattress sutures were used along the superior and inferior portions to approximate the skin edges.  A fourth horizontal mattress suture was used in the midpoint again to more approximate skin edges.  Several simple interrupted sutures were used to close the skin edges fully. The wound was cleaned with water. Petroleum jelly was applied and nonadherent dressing with bulky padding and Ace wrap was applied to achieve good anesthesia.  Patient will return in 10 days for suture removal with nurse visit. Recheck with me as scheduled on September 24.   Incidentally Xanax refilled. PDMP reviewed during this encounter.

## 2018-09-12 ENCOUNTER — Encounter: Payer: Self-pay | Admitting: Family Medicine

## 2018-09-12 DIAGNOSIS — R161 Splenomegaly, not elsewhere classified: Secondary | ICD-10-CM | POA: Diagnosis not present

## 2018-09-12 DIAGNOSIS — I10 Essential (primary) hypertension: Secondary | ICD-10-CM | POA: Diagnosis not present

## 2018-09-12 DIAGNOSIS — K6389 Other specified diseases of intestine: Secondary | ICD-10-CM | POA: Diagnosis not present

## 2018-09-12 DIAGNOSIS — M5136 Other intervertebral disc degeneration, lumbar region: Secondary | ICD-10-CM | POA: Diagnosis not present

## 2018-09-12 DIAGNOSIS — Z79899 Other long term (current) drug therapy: Secondary | ICD-10-CM | POA: Diagnosis not present

## 2018-09-12 DIAGNOSIS — E119 Type 2 diabetes mellitus without complications: Secondary | ICD-10-CM | POA: Diagnosis not present

## 2018-09-12 DIAGNOSIS — R1031 Right lower quadrant pain: Secondary | ICD-10-CM | POA: Diagnosis not present

## 2018-09-12 DIAGNOSIS — Z888 Allergy status to other drugs, medicaments and biological substances status: Secondary | ICD-10-CM | POA: Diagnosis not present

## 2018-09-12 DIAGNOSIS — Z7984 Long term (current) use of oral hypoglycemic drugs: Secondary | ICD-10-CM | POA: Diagnosis not present

## 2018-09-12 DIAGNOSIS — R110 Nausea: Secondary | ICD-10-CM | POA: Diagnosis not present

## 2018-09-12 DIAGNOSIS — R102 Pelvic and perineal pain: Secondary | ICD-10-CM | POA: Diagnosis not present

## 2018-09-21 ENCOUNTER — Ambulatory Visit (INDEPENDENT_AMBULATORY_CARE_PROVIDER_SITE_OTHER): Payer: BC Managed Care – PPO | Admitting: Physician Assistant

## 2018-09-21 ENCOUNTER — Other Ambulatory Visit: Payer: Self-pay

## 2018-09-21 VITALS — BP 129/75 | HR 140

## 2018-09-21 DIAGNOSIS — Z4802 Encounter for removal of sutures: Secondary | ICD-10-CM

## 2018-09-21 NOTE — Progress Notes (Signed)
Pt here for suture removal on R leg. Area cleaned prior to removal. No problems after removal.   Dr. Dianah Field viewed the area and applied steri strips and advised pt to RTC in 1 week for check. Marland KitchenMaryruth Eve, [place], CMA

## 2018-09-24 ENCOUNTER — Encounter: Payer: Self-pay | Admitting: Family Medicine

## 2018-09-25 ENCOUNTER — Other Ambulatory Visit: Payer: Self-pay | Admitting: Registered"

## 2018-09-25 DIAGNOSIS — R6889 Other general symptoms and signs: Secondary | ICD-10-CM | POA: Diagnosis not present

## 2018-09-25 DIAGNOSIS — Z20822 Contact with and (suspected) exposure to covid-19: Secondary | ICD-10-CM

## 2018-09-27 LAB — NOVEL CORONAVIRUS, NAA: SARS-CoV-2, NAA: NOT DETECTED

## 2018-09-28 ENCOUNTER — Ambulatory Visit: Payer: BC Managed Care – PPO

## 2018-09-28 ENCOUNTER — Other Ambulatory Visit: Payer: Self-pay

## 2018-10-01 ENCOUNTER — Other Ambulatory Visit: Payer: Self-pay | Admitting: Family Medicine

## 2018-10-01 ENCOUNTER — Ambulatory Visit: Payer: BC Managed Care – PPO | Admitting: Obstetrics & Gynecology

## 2018-10-01 ENCOUNTER — Other Ambulatory Visit: Payer: Self-pay

## 2018-10-01 ENCOUNTER — Encounter: Payer: Self-pay | Admitting: Family Medicine

## 2018-10-01 ENCOUNTER — Encounter: Payer: Self-pay | Admitting: Obstetrics & Gynecology

## 2018-10-01 ENCOUNTER — Ambulatory Visit (INDEPENDENT_AMBULATORY_CARE_PROVIDER_SITE_OTHER): Payer: BC Managed Care – PPO | Admitting: Family Medicine

## 2018-10-01 VITALS — BP 140/90 | HR 133 | Wt 265.0 lb

## 2018-10-01 VITALS — BP 140/90 | HR 133 | Ht 66.0 in | Wt 265.0 lb

## 2018-10-01 DIAGNOSIS — E669 Obesity, unspecified: Secondary | ICD-10-CM

## 2018-10-01 DIAGNOSIS — Z0000 Encounter for general adult medical examination without abnormal findings: Secondary | ICD-10-CM

## 2018-10-01 DIAGNOSIS — E1169 Type 2 diabetes mellitus with other specified complication: Secondary | ICD-10-CM | POA: Diagnosis not present

## 2018-10-01 DIAGNOSIS — R000 Tachycardia, unspecified: Secondary | ICD-10-CM

## 2018-10-01 DIAGNOSIS — N83201 Unspecified ovarian cyst, right side: Secondary | ICD-10-CM | POA: Diagnosis not present

## 2018-10-01 LAB — POCT GLYCOSYLATED HEMOGLOBIN (HGB A1C): HbA1c, POC (controlled diabetic range): 9 % — AB (ref 0.0–7.0)

## 2018-10-01 MED ORDER — METOPROLOL SUCCINATE ER 100 MG PO TB24: 100.0000 mg | ORAL_TABLET | Freq: Every day | ORAL | 1 refills | Status: DC

## 2018-10-01 MED ORDER — TRESIBA FLEXTOUCH 100 UNIT/ML ~~LOC~~ SOPN: 10.0000 [IU] | PEN_INJECTOR | Freq: Every day | SUBCUTANEOUS | 12 refills | Status: DC

## 2018-10-01 MED ORDER — ADVOCATE INSULIN PEN NEEDLES 33G X 4 MM MISC: 1.0000 | Freq: Every day | 12 refills | Status: DC

## 2018-10-01 NOTE — Patient Instructions (Addendum)
Thank you for coming in today. We will start insulin.  Start at 10 units per day and increase by 2 units every 3 days that fasting blood sugar is more than 130.  Increse metoprolol to 100mg .   Let me know about insulin pens.   Use coupon for insulin.   Recheck in 1 month

## 2018-10-01 NOTE — Telephone Encounter (Signed)
[facility] requesting med refill for Levemir. As per pharmacy - is rx is replacing [place] med? Pls advise, thanks.

## 2018-10-01 NOTE — Progress Notes (Signed)
Christine Grant is a 43 y.o. female who presents to [facility] [place] today for follow up T2DM and HTN  Tachycardia - Heart rate was Patient has been taking metoprolol as prescribed. She reports her resting heart rate has decreased to 115 from 130 since metoprolol was increased to 50 mg qd on 8/24  T2DM - Blood sugar after meals still remain in the low 200s. Fasting BGs have have been able to get under 200 sometimes, best was 170, but they usually range between 170-210. Patient taking medications as prescribed. A1C is 9 today.   Abdominal Pain/Pelvic Pain -Patient reported she followed up this AM with OBGYN for persistent lower pelvic pain. Scheduled a dx lap for left ovary removal later this fall.   ROS as above: No H/A, vision changes, chest pain, SOB, syncope, dizziness  Exam:  BP 140/90   Pulse (!) 133   Wt 265 lb (120.2 kg)   LMP  (LMP Unknown)   BMI 42.77 kg/m  Wt Readings from Last 5 Encounters:  10/01/18 265 lb (120.2 kg)  10/01/18 265 lb (120.2 kg)  09/11/18 267 lb (121.1 kg)  09/03/18 265 lb (120.2 kg)  08/06/18 270 lb (122.5 kg)    Gen: Well NAD HEENT: EOMI,  MMM Lungs: Normal work of breathing. CTABL Heart: Mild tachycardia regular rhythm no MRG Exts: Brisk capillary refill, warm and well perfused.  Psych: A&O x 3, normal speech and thought process  Lab and Radiology Results Results for orders placed or performed in visit on 10/01/18 (from the past 72 hour(s))  POCT HgB A1C     Status: Abnormal   Collection Time: 10/01/18 11:07 AM  Result Value Ref Range   Hemoglobin A1C     HbA1c POC (<> result, manual entry)     HbA1c, POC (prediabetic range)     HbA1c, POC (controlled diabetic range) 9.0 (A) 0.0 - 7.0 %   No results found.    Assessment and Plan: 43 y.o. female with T2DM and Hypertension here for a follow up of diabetes and  hypertension.    Type 2 Diabetes Mellitus With blood sugars still in the 200s, will start insulin degludec at 10 units daily. Instructed patient to titrate 2 units up at a time every 2-3 days as blood sugars remain elevated.  Tachycardia Given HR persistently elevated > 100, will increase metoprolol to100 mg daily.    Recheck 1 month.  PDMP not reviewed this encounter. Orders Placed This Encounter  Procedures  . POCT HgB A1C   Meds ordered this encounter  Medications  . metoprolol succinate (TOPROL-XL) 100 MG 24 hr tablet    Sig: Take 1 tablet (100 mg total) by mouth daily.    Dispense:  90 tablet    Refill:  1  . insulin degludec (TRESIBA FLEXTOUCH) 100 UNIT/ML SOPN FlexTouch Pen    Sig: Inject 0.1 mLs (10 Units total) into the skin daily.    Dispense:  3 pen    Refill:  12  . Insulin Pen Needle (ADVOCATE INSULIN PEN NEEDLES) 33G X 4 MM MISC    Sig: 1 each by Does not apply route daily.    Dispense:  100 each    Refill:  12     Historical information moved to improve visibility of documentation.  Past Medical History:  Diagnosis Date  . Abnormal uterine bleeding   . Anxiety   . Arthritis    knees  .  Chronic pelvic pain in female   . Depression   . Endometriosis of pelvis   . Fibromyalgia   . History of ketoacidosis    01-15-2014  . PONV (postoperative nausea and vomiting)   . Sinus tachycardia   . Type 2 diabetes mellitus ([place])   . Wears contact lenses    Past Surgical History:  Procedure Laterality Date  . ABDOMINAL HYSTERECTOMY    . BREAST REDUCTION SURGERY  age 22  . CESAREAN SECTION  2002,  2005,  2008  . DILATION AND CURETTAGE OF UTERUS N/A 02/13/2014   Procedure: DILATATION AND CURETTAGE;  Surgeon: Everitt Amber, MD;  Location: [facility];  Service: Gynecology;  Laterality: N/A;  . LEFT OOPHORECTOMY  2019  . MANDIBLE SURGERY  age 8   Correct overbite   Social History   Tobacco Use  . Smoking status: Never Smoker  . Smokeless  tobacco: Never Used  Substance Use Topics  . Alcohol use: No   family history includes Hyperlipidemia in her mother; Hypertension in her mother. She was adopted.  Medications: Current Outpatient Medications  Medication Sig Dispense Refill  . ALPRAZolam (XANAX) 1 MG tablet Take 1 tablet (1 mg total) by mouth 2 (two) times daily as needed. 60 tablet 5  . cyclobenzaprine (FLEXERIL) 10 MG tablet Take 1 tablet (10 mg total) by mouth 3 (three) times daily as needed for muscle spasms. 270 tablet 1  . dapagliflozin propanediol (FARXIGA) 10 MG TABS tablet Take 10 mg by mouth daily. 90 tablet 1  . Dulaglutide (TRULICITY) 1.5 0000000 SOPN Inject 1.5 mg into the skin once a week. 4 pen 11  . gabapentin (NEURONTIN) 300 MG capsule Take 900 mg by mouth 3 (three) times daily.    . insulin degludec (TRESIBA FLEXTOUCH) 100 UNIT/ML SOPN FlexTouch Pen Inject 0.1 mLs (10 Units total) into the skin daily. 3 pen 12  . Insulin Pen Needle (ADVOCATE INSULIN PEN NEEDLES) 33G X 4 MM MISC 1 each by Does not apply route daily. 100 each 12  . lisinopril (ZESTRIL) 10 MG tablet Take 1 tablet (10 mg total) by mouth daily. 90 tablet 1  . metFORMIN (GLUCOPHAGE XR) 500 MG 24 hr tablet Take 2 tablets (1,000 mg total) by mouth 2 (two) times daily. 360 tablet 1  . metoprolol succinate (TOPROL-XL) 100 MG 24 hr tablet Take 1 tablet (100 mg total) by mouth daily. 90 tablet 1  . promethazine (PHENERGAN) 25 MG tablet Take 1 tablet (25 mg total) by mouth every 6 (six) hours as needed for nausea or vomiting. 30 tablet 2  . triamcinolone cream (KENALOG) 0.5 % Apply 1 application topically 2 (two) times daily. To affected areas. 30 g 3  . zolpidem (AMBIEN CR) 12.5 MG CR tablet Take 12.5 mg by mouth at bedtime as needed.     No current facility-administered medications for this visit.    Allergies  Allergen Reactions  . Toradol [Ketorolac Tromethamine] Other (See Comments)    Chest tightness, flushed      Discussed warning signs or  symptoms. Please see discharge instructions. Patient expresses understanding.  I personally was present and performed or re-performed the history, physical exam and medical decision-making activities of this service and have verified that the service and findings are accurately documented in the student's note. ___________________________________________ Lynne Leader M.D., ABFM., CAQSM. Primary Care and Sports Medicine Adjunct Instructor of [employer] [facility] of Medicine

## 2018-10-01 NOTE — Progress Notes (Signed)
   Subjective:    Patient ID: Christine Grant, female    DOB: 04-Sep-1975, 43 y.o.   MRN: [id number]  HPI 43 yo married P3 (93, 34 yo kids, 1 deceased son) here today to discuss a right ovarian cyst. She had a TLH, left oopherectomy, and probable Bilateral salpingectomy. The left ovary was removed at [facility] about a year ago, done emergently for a cyst, done laparatomy after a failed laparoscopy. The TLH was done in [place] in 2015. She is in pain "all the time", takes tylenol, says IBU no help.  Review of Systems She has also  Had 3 cesarean sections.  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 She has been married for 20 years.    Objective:   Physical Exam Breathing, conversing, and ambulating normally Well nourished, well hydrated White female, no apparent distress Abd- benign, morbidly obese       Assessment & Plan:  Symptomatic large right ovarian cyst (8cm)- plan for dx lap. If there is not too much adhesions, then I will remove the ovary laparoscopically. If too many adhesions, then laparatomy.  She understands that she will be POSTMENOPAUSAL. Preventative- schedule mammogram She declines flu vaccine.  She understands the risks of surgery, including, but not to infection, bleeding, DVTs, damage to bowel, bladder, ureters. She wishes to proceed. Her HBA1C will be checked today.

## 2018-10-02 ENCOUNTER — Telehealth: Payer: Self-pay

## 2018-10-02 NOTE — Telephone Encounter (Signed)
[facility], PA required for levemir flextouch 100u. Thanks.

## 2018-10-03 NOTE — Telephone Encounter (Signed)
I attempted to submit the information to insurance for PA and received a message that authorization is on file for the [place]. [facility] to let [facility] know. Pharmacy has filled the medication and patient has picked up.

## 2018-10-04 ENCOUNTER — Ambulatory Visit: Payer: BC Managed Care – PPO | Admitting: Family Medicine

## 2018-10-05 ENCOUNTER — Telehealth: Payer: Self-pay | Admitting: *Deleted

## 2018-10-05 MED ORDER — NAPROXEN 250 MG PO TABS: 1.0000 mg | ORAL_TABLET | Freq: Two times a day (BID) | ORAL | 1 refills | Status: DC

## 2018-10-05 NOTE — Telephone Encounter (Signed)
Pt requested Naprosyn for her pain and per DR Rea [employer] was [facility] to [facility].

## 2018-10-15 ENCOUNTER — Other Ambulatory Visit: Payer: Self-pay | Admitting: Family Medicine

## 2018-10-15 DIAGNOSIS — E1169 Type 2 diabetes mellitus with other specified complication: Secondary | ICD-10-CM

## 2018-10-17 ENCOUNTER — Encounter: Payer: Self-pay | Admitting: Family Medicine

## 2018-10-18 NOTE — Telephone Encounter (Signed)
Please advise on Trulicity refills. It is on med list but does not look like it has been written since 12/24/14

## 2018-10-19 ENCOUNTER — Encounter: Payer: Self-pay | Admitting: Family Medicine

## 2018-10-22 NOTE — Telephone Encounter (Signed)
Patient spouse picked up 4 samples of Tresiba.  Lot: [id number] Expiration: 11/2019.

## 2018-10-25 ENCOUNTER — Other Ambulatory Visit: Payer: Self-pay | Admitting: Obstetrics & Gynecology

## 2018-10-25 ENCOUNTER — Ambulatory Visit (INDEPENDENT_AMBULATORY_CARE_PROVIDER_SITE_OTHER): Payer: BC Managed Care – PPO

## 2018-10-25 ENCOUNTER — Other Ambulatory Visit: Payer: Self-pay

## 2018-10-25 DIAGNOSIS — Z0000 Encounter for general adult medical examination without abnormal findings: Secondary | ICD-10-CM | POA: Diagnosis not present

## 2018-10-25 DIAGNOSIS — Z1231 Encounter for screening mammogram for malignant neoplasm of breast: Secondary | ICD-10-CM | POA: Diagnosis not present

## 2018-10-29 ENCOUNTER — Encounter: Payer: Self-pay | Admitting: Family Medicine

## 2018-10-29 ENCOUNTER — Other Ambulatory Visit: Payer: Self-pay

## 2018-10-29 ENCOUNTER — Ambulatory Visit (INDEPENDENT_AMBULATORY_CARE_PROVIDER_SITE_OTHER): Payer: BC Managed Care – PPO | Admitting: Family Medicine

## 2018-10-29 VITALS — BP 116/84 | HR 116 | Temp 97.5°F | Ht 66.0 in | Wt 261.0 lb

## 2018-10-29 DIAGNOSIS — M797 Fibromyalgia: Secondary | ICD-10-CM | POA: Diagnosis not present

## 2018-10-29 DIAGNOSIS — E1169 Type 2 diabetes mellitus with other specified complication: Secondary | ICD-10-CM | POA: Diagnosis not present

## 2018-10-29 DIAGNOSIS — E669 Obesity, unspecified: Secondary | ICD-10-CM

## 2018-10-29 DIAGNOSIS — E1159 Type 2 diabetes mellitus with other circulatory complications: Secondary | ICD-10-CM | POA: Diagnosis not present

## 2018-10-29 DIAGNOSIS — I10 Essential (primary) hypertension: Secondary | ICD-10-CM

## 2018-10-29 DIAGNOSIS — R000 Tachycardia, unspecified: Secondary | ICD-10-CM

## 2018-10-29 DIAGNOSIS — I152 Hypertension secondary to endocrine disorders: Secondary | ICD-10-CM

## 2018-10-29 MED ORDER — TRAMADOL HCL 50 MG PO TABS: 50.0000 mg | ORAL_TABLET | Freq: Every day | ORAL | 0 refills | Status: DC | PRN

## 2018-10-29 MED ORDER — LEVEMIR FLEXTOUCH 100 UNIT/ML ~~LOC~~ SOPN: 20.0000 [IU] | PEN_INJECTOR | Freq: Every day | SUBCUTANEOUS | 12 refills | Status: DC

## 2018-10-29 NOTE — Patient Instructions (Addendum)
Thank you for coming in today. Continue current medicine. Recheck in 3 months if all is well.  Schedule with Dr Sheppard Coil or Dr Zigmund Daniel.   Return sooner if needed.

## 2018-10-29 NOTE — Progress Notes (Signed)
Christine Grant is a 43 y.o. female who presents to [facility] [place] today for follow-up diabetes and tachycardia.  Tachycardia: Persistent issue thought to be sinus tachycardia.  Working on increasing metoprolol.  At last visit 1 month ago metoprolol was increased to 100 mg daily.  She is tolerating medication well.  She notes her heart rate is usually less than 100 bpm when she checks at home.  No lightheadedness or dizziness chest pain or palpitations.    Diabetes: At last visit blood sugars were still in the 200s.  Started Tresiba insulin at 10 units daily with plan to titrate by 2 units every 2 to 3 days.  In the interim she is still using the sample pen of Tresiba insulin currently at 20 units/day.  Blood sugars are usually the 120s or 130s.  She denies polyuria or polydipsia.  Her insurance prefers Levemir insulin.    Additionally she was scheduled for oophorectomy by Dr. Hulan Fray in November which had to be pushed back to December due to some travel and scheduling issues for Christine Grant   ROS as above:  Exam:  BP 116/84   Pulse (!) 116   Temp (!) 97.5 F (36.4 C)   Ht 5\' 6"  (1.676 m)   Wt 261 lb (118.4 kg)   LMP  (LMP Unknown)   SpO2 100%   BMI 42.13 kg/m  Wt Readings from Last 5 Encounters:  10/29/18 261 lb (118.4 kg)  10/01/18 265 lb (120.2 kg)  10/01/18 265 lb (120.2 kg)  09/11/18 267 lb (121.1 kg)  09/03/18 265 lb (120.2 kg)    Gen: Well NAD HEENT: EOMI,  MMM Lungs: Normal work of breathing. CTABL Heart: RRR no MRG heart rate 90 bpm per my check Abd: NABS, Soft. Nondistended, Nontender Exts: Brisk capillary refill, warm and well perfused.   Lab and Radiology Results No results found for this or any previous visit (from the past 72 hour(s)). No results found.    Assessment and Plan: 43 y.o. female with  Tachycardia: Better controlled.  Plan to continue  current dose of metoprolol.  Recheck in 3 months.  Diabetes: Better controlled now.  A1c will be improved when due for recheck in about 3 months.  Schedule with Dr. Sheppard Coil.  Preop: Patient reasonably optimized for surgery but warned patient to contact her OB/GYN.  If anticipate need for preop authorization appointment she should schedule with Dr. Sheppard Coil now for late November.  Refill chronic tramadol.  Continue to refill until follow-up with Sheppard Coil in 3 months.  PDMP reviewed during this encounter. No orders of the defined types were placed in this encounter.  Meds ordered this encounter  Medications  . Insulin Detemir (LEVEMIR FLEXTOUCH) 100 UNIT/ML Pen    Sig: Inject 20 Units into the skin daily.    Dispense:  10 pen    Refill:  12    Replaces triseba  . traMADol (ULTRAM) 50 MG tablet    Sig: Take 1 tablet (50 mg total) by mouth daily as needed.    Dispense:  30 tablet    Refill:  0    Not to exceed 5 additional fills before 03/02/2019     Historical information moved to improve visibility of documentation.  Past Medical History:  Diagnosis Date  . Abnormal uterine bleeding   . Anxiety   . Arthritis    knees  . Chronic pelvic pain in female   . Depression   .  Endometriosis of pelvis   . Fibromyalgia   . History of ketoacidosis    01-15-2014  . PONV (postoperative nausea and vomiting)   . Sinus tachycardia   . Type 2 diabetes mellitus ([place])   . Wears contact lenses    Past Surgical History:  Procedure Laterality Date  . ABDOMINAL HYSTERECTOMY    . BREAST REDUCTION SURGERY  age 38  . CESAREAN SECTION  2002,  2005,  2008  . DILATION AND CURETTAGE OF UTERUS N/A 02/13/2014   Procedure: DILATATION AND CURETTAGE;  Surgeon: Everitt Amber, MD;  Location: [facility];  Service: Gynecology;  Laterality: N/A;  . LEFT OOPHORECTOMY  2019  . MANDIBLE SURGERY  age 73   Correct overbite  . REDUCTION MAMMAPLASTY     Social History   Tobacco Use  . Smoking  status: Never Smoker  . Smokeless tobacco: Never Used  Substance Use Topics  . Alcohol use: No   family history includes Hyperlipidemia in her mother; Hypertension in her mother. She was adopted.  Medications: Current Outpatient Medications  Medication Sig Dispense Refill  . ALPRAZolam (XANAX) 1 MG tablet Take 1 tablet (1 mg total) by mouth 2 (two) times daily as needed. 60 tablet 5  . cyclobenzaprine (FLEXERIL) 10 MG tablet Take 1 tablet (10 mg total) by mouth 3 (three) times daily as needed for muscle spasms. 270 tablet 1  . dapagliflozin propanediol (FARXIGA) 10 MG TABS tablet Take 10 mg by mouth daily. 90 tablet 1  . gabapentin (NEURONTIN) 300 MG capsule Take 900 mg by mouth 3 (three) times daily.    . Insulin Detemir (LEVEMIR FLEXTOUCH) 100 UNIT/ML Pen Inject 20 Units into the skin daily. 10 pen 12  . Insulin Pen Needle (ADVOCATE INSULIN PEN NEEDLES) 33G X 4 MM MISC 1 each by Does not apply route daily. 100 each 12  . lisinopril (ZESTRIL) 10 MG tablet Take 1 tablet (10 mg total) by mouth daily. 90 tablet 1  . metFORMIN (GLUCOPHAGE XR) 500 MG 24 hr tablet Take 2 tablets (1,000 mg total) by mouth 2 (two) times daily. 360 tablet 1  . metoprolol succinate (TOPROL-XL) 100 MG 24 hr tablet Take 1 tablet (100 mg total) by mouth daily. 90 tablet 1  . naproxen (NAPROSYN) 250 MG tablet Take 0.5 tablets (125 mg total) by mouth 2 (two) times daily with a meal. 60 tablet 1  . promethazine (PHENERGAN) 25 MG tablet Take 1 tablet (25 mg total) by mouth every 6 (six) hours as needed for nausea or vomiting. 30 tablet 2  . traMADol (ULTRAM) 50 MG tablet Take 1 tablet (50 mg total) by mouth daily as needed. 30 tablet 0  . triamcinolone cream (KENALOG) 0.5 % Apply 1 application topically 2 (two) times daily. To affected areas. 30 g 3  . TRULICITY 1.5 0000000 SOPN INJECT 0.5 ML AS DIRECTED AS DIRECTED SUBCUTANEOUS 30 DAYS 2 pen 5  . zolpidem (AMBIEN CR) 12.5 MG CR tablet Take 12.5 mg by mouth at bedtime as  needed.     No current facility-administered medications for this visit.    Allergies  Allergen Reactions  . Toradol [Ketorolac Tromethamine] Other (See Comments)    Chest tightness, flushed      Discussed warning signs or symptoms. Please see discharge instructions. Patient expresses understanding.

## 2018-11-05 DIAGNOSIS — R062 Wheezing: Secondary | ICD-10-CM | POA: Diagnosis not present

## 2018-11-05 DIAGNOSIS — Z888 Allergy status to other drugs, medicaments and biological substances status: Secondary | ICD-10-CM | POA: Diagnosis not present

## 2018-11-05 DIAGNOSIS — Z7984 Long term (current) use of oral hypoglycemic drugs: Secondary | ICD-10-CM | POA: Diagnosis not present

## 2018-11-05 DIAGNOSIS — R0789 Other chest pain: Secondary | ICD-10-CM | POA: Diagnosis not present

## 2018-11-05 DIAGNOSIS — R509 Fever, unspecified: Secondary | ICD-10-CM | POA: Diagnosis not present

## 2018-11-05 DIAGNOSIS — Z79899 Other long term (current) drug therapy: Secondary | ICD-10-CM | POA: Diagnosis not present

## 2018-11-05 DIAGNOSIS — I10 Essential (primary) hypertension: Secondary | ICD-10-CM | POA: Diagnosis not present

## 2018-11-05 DIAGNOSIS — R42 Dizziness and giddiness: Secondary | ICD-10-CM | POA: Diagnosis not present

## 2018-11-05 DIAGNOSIS — M797 Fibromyalgia: Secondary | ICD-10-CM | POA: Diagnosis not present

## 2018-11-05 DIAGNOSIS — Z20828 Contact with and (suspected) exposure to other viral communicable diseases: Secondary | ICD-10-CM | POA: Diagnosis not present

## 2018-11-05 DIAGNOSIS — Z791 Long term (current) use of non-steroidal anti-inflammatories (NSAID): Secondary | ICD-10-CM | POA: Diagnosis not present

## 2018-11-05 DIAGNOSIS — R0602 Shortness of breath: Secondary | ICD-10-CM | POA: Diagnosis not present

## 2018-11-05 DIAGNOSIS — E119 Type 2 diabetes mellitus without complications: Secondary | ICD-10-CM | POA: Diagnosis not present

## 2018-11-05 DIAGNOSIS — F419 Anxiety disorder, unspecified: Secondary | ICD-10-CM | POA: Diagnosis not present

## 2018-11-07 ENCOUNTER — Ambulatory Visit (INDEPENDENT_AMBULATORY_CARE_PROVIDER_SITE_OTHER): Payer: BC Managed Care – PPO | Admitting: Family Medicine

## 2018-11-07 ENCOUNTER — Encounter: Payer: Self-pay | Admitting: Family Medicine

## 2018-11-07 VITALS — Temp 97.9°F | Ht 66.0 in | Wt 261.0 lb

## 2018-11-07 DIAGNOSIS — Z7689 Persons encountering health services in other specified circumstances: Secondary | ICD-10-CM

## 2018-11-07 NOTE — Addendum Note (Signed)
Addended by: Dessie Coma on: 11/07/2018 02:01 PM   Modules accepted: Orders

## 2018-11-07 NOTE — Progress Notes (Signed)
    Virtual Visit  via Video Note  I connected with      Christine Grant by a video enabled telemedicine application and verified that I am speaking with the correct person using two identifiers.   I discussed the limitations of evaluation and management by telemedicine and the availability of in person appointments. The patient expressed understanding and agreed to proceed.  History of Present Illness: Christine Grant is a 43 y.o. female needs a work note to return to work.  She was tested for Covid and is negative.  She is asymptomatic.  She needs a letter to return to work.     Observations/Objective:  Exam: Appearance nontoxic Normal Speech.      Assessment and Plan: 43 y.o. female with Covid test negative.  May return to work as long as she is not symptomatic.  Letter written.  In hindsight this did not need to be had medical visit and could have just been a letter.  No charge will be submitted.

## 2018-11-16 ENCOUNTER — Encounter: Payer: Self-pay | Admitting: Osteopathic Medicine

## 2018-11-18 DIAGNOSIS — R109 Unspecified abdominal pain: Secondary | ICD-10-CM | POA: Diagnosis not present

## 2018-11-18 DIAGNOSIS — Z888 Allergy status to other drugs, medicaments and biological substances status: Secondary | ICD-10-CM | POA: Diagnosis not present

## 2018-11-18 DIAGNOSIS — Z90710 Acquired absence of both cervix and uterus: Secondary | ICD-10-CM | POA: Diagnosis not present

## 2018-11-18 DIAGNOSIS — Z79899 Other long term (current) drug therapy: Secondary | ICD-10-CM | POA: Diagnosis not present

## 2018-11-18 DIAGNOSIS — K5730 Diverticulosis of large intestine without perforation or abscess without bleeding: Secondary | ICD-10-CM | POA: Diagnosis not present

## 2018-11-18 DIAGNOSIS — R10813 Right lower quadrant abdominal tenderness: Secondary | ICD-10-CM | POA: Diagnosis not present

## 2018-11-18 DIAGNOSIS — R188 Other ascites: Secondary | ICD-10-CM | POA: Diagnosis not present

## 2018-11-18 DIAGNOSIS — I10 Essential (primary) hypertension: Secondary | ICD-10-CM | POA: Diagnosis not present

## 2018-11-18 DIAGNOSIS — E119 Type 2 diabetes mellitus without complications: Secondary | ICD-10-CM | POA: Diagnosis not present

## 2018-11-18 DIAGNOSIS — Z7984 Long term (current) use of oral hypoglycemic drugs: Secondary | ICD-10-CM | POA: Diagnosis not present

## 2018-11-18 DIAGNOSIS — N3289 Other specified disorders of bladder: Secondary | ICD-10-CM | POA: Diagnosis not present

## 2018-11-18 DIAGNOSIS — R110 Nausea: Secondary | ICD-10-CM | POA: Diagnosis not present

## 2018-11-18 DIAGNOSIS — N83201 Unspecified ovarian cyst, right side: Secondary | ICD-10-CM | POA: Diagnosis not present

## 2018-11-18 DIAGNOSIS — K5900 Constipation, unspecified: Secondary | ICD-10-CM | POA: Diagnosis not present

## 2018-11-18 DIAGNOSIS — R1013 Epigastric pain: Secondary | ICD-10-CM | POA: Diagnosis not present

## 2018-11-18 DIAGNOSIS — N83291 Other ovarian cyst, right side: Secondary | ICD-10-CM | POA: Diagnosis not present

## 2018-11-18 DIAGNOSIS — N3090 Cystitis, unspecified without hematuria: Secondary | ICD-10-CM | POA: Diagnosis not present

## 2018-11-20 ENCOUNTER — Ambulatory Visit: Admit: 2018-11-20 | Payer: BC Managed Care – PPO | Admitting: Obstetrics & Gynecology

## 2018-11-20 SURGERY — OOPHORECTOMY, LAPAROSCOPIC
Anesthesia: Choice | Laterality: Right

## 2018-11-20 MED ORDER — ZOLPIDEM TARTRATE ER 12.5 MG PO TBCR: 12.5000 mg | EXTENDED_RELEASE_TABLET | Freq: Every evening | ORAL | 1 refills | Status: DC | PRN

## 2018-11-20 NOTE — Telephone Encounter (Signed)
Christine Grant,  Can you call patient to schedule appointment.  Thanks so much!!

## 2018-11-20 NOTE — Telephone Encounter (Signed)
I sent 30 tablets with 1 refill, since this is a controlled substance she should establish with new doctor to get this done.  Can make a follow-up with me or can put her on for Joy, Dr Zigmund Daniel, or myself. I'm ok to refill until then but she needs to establish w/ someone within the next 3 mos

## 2018-11-21 NOTE — Telephone Encounter (Signed)
Left patient a voicemail with information below. Let patient know to call us back to schedule an appointment in the office. °

## 2018-11-27 ENCOUNTER — Encounter: Payer: Self-pay | Admitting: Physician Assistant

## 2018-11-27 ENCOUNTER — Ambulatory Visit (INDEPENDENT_AMBULATORY_CARE_PROVIDER_SITE_OTHER): Payer: BC Managed Care – PPO | Admitting: Physician Assistant

## 2018-11-27 VITALS — Temp 99.8°F | Ht 66.0 in | Wt 260.0 lb

## 2018-11-27 DIAGNOSIS — N3000 Acute cystitis without hematuria: Secondary | ICD-10-CM

## 2018-11-27 DIAGNOSIS — R52 Pain, unspecified: Secondary | ICD-10-CM | POA: Diagnosis not present

## 2018-11-27 DIAGNOSIS — N83201 Unspecified ovarian cyst, right side: Secondary | ICD-10-CM

## 2018-11-27 DIAGNOSIS — R509 Fever, unspecified: Secondary | ICD-10-CM

## 2018-11-27 DIAGNOSIS — R1084 Generalized abdominal pain: Secondary | ICD-10-CM

## 2018-11-27 MED ORDER — NITROFURANTOIN MONOHYD MACRO 100 MG PO CAPS: 100.0000 mg | ORAL_CAPSULE | Freq: Two times a day (BID) | ORAL | 0 refills | Status: DC

## 2018-11-27 NOTE — Progress Notes (Deleted)
Couple weeks hx: Stomach pain/nausea/pelvic pain Urinary frequency/ dysuria Body aches Highest fever 100.2 - but taking tylenol  Treated for bladder infection in ER two weeks ago (Sunday before last) Finished antibiotics yesterday (keflex) also given Rocephin in ER Still not feeling well

## 2018-11-27 NOTE — Progress Notes (Signed)
Patient ID: Christine Grant, female   DOB: 1975-11-02, 43 y.o.   MRN: [id number] .Marland KitchenVirtual Visit via Video Note  I connected with Christine Grant on 11-29-2018 at  9:10 AM EST by a video enabled telemedicine application and verified that I am speaking with the correct person using two identifiers.  Location: Patient: home Provider: clinic   I discussed the limitations of evaluation and management by telemedicine and the availability of in person appointments. The patient expressed understanding and agreed to proceed.  History of Present Illness: Pt is a 43 yo female who calls into the clinic to follow up after ED visit on 11/8 for constipation, right ovarian cyst, acute cystitis. She had labs, CT of abdomen, U/S done in ED. All that was seen was right ovarian cyst and inflamed bladder. She was given shot of rocephin, keflex. She continues to have urinary symptoms of frequency and urgency. she is nauseated and having some lower abdominal pain.  She is also having body aches, chills, low grade fever of 100.2.   Pt denies any loss of smell or taste. 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   .. Active Ambulatory Problems    Diagnosis Date Noted  . Fibromyalgia muscle pain 10/07/2013  . Diabetes mellitus type 2 in obese ([place]) 10/07/2013  . GAD (generalized anxiety disorder) 10/07/2013  . Endometriosis of pelvis 10/07/2013  . Morbid obesity ([place]) 07/09/2014  . Hypertriglyceridemia 12/24/2014  . Primary osteoarthritis of both hips 01/22/2015  . Degenerative disc disease, cervical 01/22/2015  . Benign paroxysmal positional vertigo 02/02/2015  . Hypertension associated with diabetes ([place]) 02/02/2015  . Sinus tachycardia 05/27/2015  . History of migraine headaches 05/27/2015  . Anemia 07/01/2015   Resolved Ambulatory Problems    Diagnosis Date Noted  . Chronic pelvic pain in female 10/07/2013  . Contusion of knee, right 10/10/2013  . Abnormal uterine bleeding 02/13/2014  .  Low back pain radiating to right leg 06/11/2014  . Skin tag 06/11/2014  . 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 Wart 08/26/2014  . Acute pyelonephritis 09/05/2014  . Seborrheic dermatitis of scalp 01/22/2015  . Acute low back pain 04/30/2015  . Chronic pain associated with significant psychosocial dysfunction 06/17/2013  . Type 2 diabetes mellitus ([place]) 09/03/2014  . Encounter for therapeutic drug level monitoring 06/17/2013  . Fasciitis 06/17/2013  . Fibromyalgia 09/03/2014  . Flank pain 09/03/2014  . Elevated WBC count 09/03/2014  . N&V (nausea and vomiting) 04/30/2015  . Adnexal pain 07/17/2013  . Infection of urinary tract 05/01/2015   Past Medical History:  Diagnosis Date  . Anxiety   . Arthritis   . Depression   . History of ketoacidosis   . PONV (postoperative nausea and vomiting)   . Wears contact lenses    Reviewed med, allergy, problem list.       Observations/Objective: No acute distress.  Normal appearance and mood.  No labored breathing.   .. Today's Vitals   2018-11-29 0858  Temp: 99.8 F (37.7 C)  TempSrc: Oral  Weight: 260 lb (117.9 kg)  Height: 5\' 6"  (1.676 m)   Body mass index is 41.97 kg/m.    Assessment and Plan: Marland KitchenMarland KitchenDiagnoses and all orders for this visit:  Acute cystitis without hematuria -     nitrofurantoin, macrocrystal-monohydrate, (MACROBID) 100 MG capsule; Take 1 capsule (100 mg total) by mouth 2 (two) times daily.  Body aches  Fever and chills  Generalized abdominal pain  Right ovarian cyst   Was not  able to locate sensitivies from [facility] in care everywhere. Treated with macrobid for 2nd round of abx for urinary symptoms. If not improving needs follow up urine and ultrasound to look at cyst again.   Concerned for COVID. Needs to get tested and self isolate until results are back.   Reassuring that CT and U/S of abdomen were done.   Follow up with any new symptoms. Worsening breathing/SOB go to ED.      Follow Up  Instructions:    I discussed the assessment and treatment plan with the patient. The patient was provided an opportunity to ask questions and all were answered. The patient agreed with the plan and demonstrated an understanding of the instructions.   The patient was advised to call back or seek an in-person evaluation if the symptoms worsen or if the condition fails to improve as anticipated.   Iran Planas, PA-C

## 2018-11-28 ENCOUNTER — Other Ambulatory Visit: Payer: Self-pay

## 2018-11-28 DIAGNOSIS — Z20822 Contact with and (suspected) exposure to covid-19: Secondary | ICD-10-CM

## 2018-11-29 ENCOUNTER — Other Ambulatory Visit: Payer: Self-pay | Admitting: Family Medicine

## 2018-11-29 LAB — NOVEL CORONAVIRUS, NAA: SARS-CoV-2, NAA: NOT DETECTED

## 2018-11-29 NOTE — Telephone Encounter (Signed)
Patient has a follow up with you in January.

## 2018-12-12 ENCOUNTER — Encounter: Payer: Self-pay | Admitting: Osteopathic Medicine

## 2018-12-12 MED ORDER — CYCLOBENZAPRINE HCL 10 MG PO TABS: 10.0000 mg | ORAL_TABLET | Freq: Three times a day (TID) | ORAL | 1 refills | Status: DC | PRN

## 2018-12-18 ENCOUNTER — Ambulatory Visit: Admit: 2018-12-18 | Payer: BC Managed Care – PPO | Admitting: Obstetrics & Gynecology

## 2018-12-18 SURGERY — OOPHORECTOMY, LAPAROSCOPIC
Anesthesia: Choice | Laterality: Right

## 2018-12-25 ENCOUNTER — Other Ambulatory Visit: Payer: Self-pay | Admitting: Family Medicine

## 2018-12-26 ENCOUNTER — Other Ambulatory Visit: Payer: Self-pay | Admitting: Osteopathic Medicine

## 2018-12-26 NOTE — Telephone Encounter (Signed)
Requested medication (s) are due for refill today: yes  Requested medication (s) are on the active medication list: yes  Last refill:  11/29/2018  Future visit scheduled: no  Notes to clinic:  refill cannot be delegated    Requested Prescriptions  Pending Prescriptions Disp Refills   traMADol (ULTRAM) 50 MG tablet [Pharmacy Med Name: TRAMADOL HCL 50 MG TABLET] 30 tablet 0    Sig: Take 1 tablet (50 mg total) by mouth daily as needed.      Not Delegated - Analgesics:  Opioid Agonists Failed - 12/26/2018  8:24 AM      Failed - This refill cannot be delegated      Passed - Urine Drug Screen completed in last 360 days.      Passed - Valid encounter within last 6 months    Recent Outpatient Visits           4 weeks ago Acute cystitis without hematuria   [facility], Royetta Car, PA-C   1 month ago Return to work evaluation   [place] [facility], Rebekah [place], MD   1 month ago Hypertension associated with diabetes [facility])   [facility], Rebekah Chester[place], MD   2 months ago Diabetes mellitus type 2 in obese [facility])   [place] [facility] [place] [facility], Rebekah [place], MD   3 months ago Visit for suture removal   [facility] [place], [place]

## 2019-01-09 DIAGNOSIS — Z6841 Body Mass Index (BMI) 40.0 and over, adult: Secondary | ICD-10-CM | POA: Diagnosis not present

## 2019-01-09 DIAGNOSIS — R109 Unspecified abdominal pain: Secondary | ICD-10-CM | POA: Diagnosis not present

## 2019-01-09 DIAGNOSIS — Z794 Long term (current) use of insulin: Secondary | ICD-10-CM | POA: Diagnosis not present

## 2019-01-09 DIAGNOSIS — R110 Nausea: Secondary | ICD-10-CM | POA: Diagnosis not present

## 2019-01-09 DIAGNOSIS — Z79899 Other long term (current) drug therapy: Secondary | ICD-10-CM | POA: Diagnosis not present

## 2019-01-09 DIAGNOSIS — Z888 Allergy status to other drugs, medicaments and biological substances status: Secondary | ICD-10-CM | POA: Diagnosis not present

## 2019-01-09 DIAGNOSIS — E669 Obesity, unspecified: Secondary | ICD-10-CM | POA: Diagnosis not present

## 2019-01-09 DIAGNOSIS — K5900 Constipation, unspecified: Secondary | ICD-10-CM | POA: Diagnosis not present

## 2019-01-09 DIAGNOSIS — I10 Essential (primary) hypertension: Secondary | ICD-10-CM | POA: Diagnosis not present

## 2019-01-09 DIAGNOSIS — N83291 Other ovarian cyst, right side: Secondary | ICD-10-CM | POA: Diagnosis not present

## 2019-01-09 DIAGNOSIS — F419 Anxiety disorder, unspecified: Secondary | ICD-10-CM | POA: Diagnosis not present

## 2019-01-09 DIAGNOSIS — N83201 Unspecified ovarian cyst, right side: Secondary | ICD-10-CM | POA: Diagnosis not present

## 2019-01-09 DIAGNOSIS — E119 Type 2 diabetes mellitus without complications: Secondary | ICD-10-CM | POA: Diagnosis not present

## 2019-01-09 DIAGNOSIS — R1031 Right lower quadrant pain: Secondary | ICD-10-CM | POA: Diagnosis not present

## 2019-01-15 ENCOUNTER — Encounter: Payer: Self-pay | Admitting: Osteopathic Medicine

## 2019-01-15 MED ORDER — GABAPENTIN 300 MG PO CAPS: 300.0000 mg | ORAL_CAPSULE | Freq: Three times a day (TID) | ORAL | 0 refills | Status: DC

## 2019-01-15 NOTE — Addendum Note (Signed)
Addended by: Dessie Coma on: 01/15/2019 04:06 PM   Modules accepted: Orders

## 2019-01-21 ENCOUNTER — Other Ambulatory Visit: Payer: Self-pay | Admitting: Osteopathic Medicine

## 2019-01-22 ENCOUNTER — Other Ambulatory Visit: Payer: Self-pay | Admitting: Osteopathic Medicine

## 2019-01-22 NOTE — Telephone Encounter (Signed)
[facility]

## 2019-01-23 ENCOUNTER — Ambulatory Visit: Payer: BC Managed Care – PPO | Attending: Internal Medicine

## 2019-01-23 DIAGNOSIS — Z20822 Contact with and (suspected) exposure to covid-19: Secondary | ICD-10-CM | POA: Diagnosis not present

## 2019-01-23 NOTE — Telephone Encounter (Signed)
Needs to establish care to maintain controlled substance, last refill until has appt!

## 2019-01-24 NOTE — Telephone Encounter (Signed)
Has an appointment with you on 01/29/19. KG LPN

## 2019-01-25 ENCOUNTER — Encounter: Payer: Self-pay | Admitting: Osteopathic Medicine

## 2019-01-25 LAB — NOVEL CORONAVIRUS, NAA: SARS-CoV-2, NAA: NOT DETECTED

## 2019-01-29 ENCOUNTER — Other Ambulatory Visit: Payer: Self-pay

## 2019-01-29 ENCOUNTER — Ambulatory Visit (INDEPENDENT_AMBULATORY_CARE_PROVIDER_SITE_OTHER): Payer: BC Managed Care – PPO | Admitting: Osteopathic Medicine

## 2019-01-29 ENCOUNTER — Encounter: Payer: Self-pay | Admitting: Osteopathic Medicine

## 2019-01-29 VITALS — BP 135/90 | HR 130 | Temp 98.0°F | Wt 275.1 lb

## 2019-01-29 DIAGNOSIS — E1169 Type 2 diabetes mellitus with other specified complication: Secondary | ICD-10-CM

## 2019-01-29 DIAGNOSIS — I471 Supraventricular tachycardia: Secondary | ICD-10-CM | POA: Diagnosis not present

## 2019-01-29 DIAGNOSIS — E669 Obesity, unspecified: Secondary | ICD-10-CM

## 2019-01-29 LAB — POCT GLYCOSYLATED HEMOGLOBIN (HGB A1C): Hemoglobin A1C: 9.4 % — AB (ref 4.0–5.6)

## 2019-01-29 MED ORDER — TRULICITY 1.5 MG/0.5ML ~~LOC~~ SOAJ: 0.5000 mL | SUBCUTANEOUS | 5 refills | Status: DC

## 2019-01-29 MED ORDER — LISINOPRIL 10 MG PO TABS: 10.0000 mg | ORAL_TABLET | Freq: Every day | ORAL | 1 refills | Status: DC

## 2019-01-29 MED ORDER — LEVEMIR FLEXTOUCH 100 UNIT/ML ~~LOC~~ SOPN: 26.0000 [IU] | PEN_INJECTOR | Freq: Every day | SUBCUTANEOUS | 12 refills | Status: DC

## 2019-01-29 NOTE — Progress Notes (Signed)
Christine Grant is a 44 y.o. female who presents to  [place] at [facility]  today, 01/29/19, seeking care for the following:  The primary encounter diagnosis was Diabetes mellitus type 2 in obese ([place]). A diagnosis of Paroxysmal supraventricular tachycardia (HCC) was also pertinent to this visit.      ASSESSMENT & PLAN with other pertinent history/findings:   1. Diabetes mellitus type 2 in obese [facility]) Patient reports that compliance with her medication regimen is definitely a challenge.  She is not consistent about taking insulin in particular, severe GI upset with Metformin.  We discussed slow titration up on insulin to target fasting blood glucose and importance of compliance with regular dosing.  2. Paroxysmal supraventricular tachycardia ([place]) Patient tachycardic now but no symptoms, patient not sure if she has taken her metoprolol in the past few days, compliance was stressed.     Orders Placed This Encounter  Procedures  . POCT HgB A1C  . EKG 12-Lead    No orders of the defined types were placed in this encounter.   There are no Patient Instructions on file for this visit.    Follow-up instructions: No follow-ups on file.      .     BP 135/90 (BP Location: Left Arm, Patient Position: Sitting, Cuff Size: Large)   Pulse (!) 130   Temp 98 F (36.7 C) (Oral)   Wt 275 lb 1.3 oz (124.8 kg)   LMP  (LMP Unknown)   SpO2 100%   BMI 44.40 kg/m   Current Meds  Medication Sig  . ALPRAZolam (XANAX) 1 MG tablet Take 1 tablet (1 mg total) by mouth 2 (two) times daily as needed.  . cyclobenzaprine (FLEXERIL) 10 MG tablet Take 1 tablet (10 mg total) by mouth 3 (three) times daily as needed for muscle spasms.  Marland Kitchen FARXIGA 10 MG TABS tablet TAKE 10 MG BY MOUTH DAILY.  Marland Kitchen gabapentin (NEURONTIN) 300 MG capsule Take 1 capsule (300 mg total) by mouth 3 (three) times daily.  . Insulin Detemir (LEVEMIR FLEXTOUCH) 100 UNIT/ML Pen  Inject 20 Units into the skin daily.  . Insulin Pen Needle (ADVOCATE INSULIN PEN NEEDLES) 33G X 4 MM MISC 1 each by Does not apply route daily.  Marland Kitchen lisinopril (ZESTRIL) 10 MG tablet Take 1 tablet (10 mg total) by mouth daily.  . metoprolol succinate (TOPROL-XL) 100 MG 24 hr tablet Take 1 tablet (100 mg total) by mouth daily.  . promethazine (PHENERGAN) 25 MG tablet Take 1 tablet (25 mg total) by mouth every 6 (six) hours as needed for nausea or vomiting.  . traMADol (ULTRAM) 50 MG tablet TAKE 1 TABLET (50 MG TOTAL) BY MOUTH DAILY AS NEEDED.  Marland Kitchen TRULICITY 1.5 0000000 SOPN INJECT 0.5 ML AS DIRECTED AS DIRECTED SUBCUTANEOUS 30 DAYS  . zolpidem (AMBIEN CR) 12.5 MG CR tablet TAKE 1 TABLET (12.5 MG TOTAL) BY MOUTH AT BEDTIME AS NEEDED.    Results for orders placed or performed in visit on 01/29/19 (from the past 72 hour(s))  POCT HgB A1C     Status: Abnormal   Collection Time: 01/29/19  9:31 AM  Result Value Ref Range   Hemoglobin A1C 9.4 (A) 4.0 - 5.6 %   HbA1c POC (<> result, manual entry)     HbA1c, POC (prediabetic range)     HbA1c, POC (controlled diabetic range)      No results found.  Depression screen [facility] 2/9 09/11/2018 09/03/2018 06/25/2018  Decreased Interest 1  1 0  Down, Depressed, Hopeless 1 1 1   PHQ - 2 Score 2 2 1   Altered sleeping 0 0 0  Tired, decreased energy 1 3 1   Change in appetite 0 1 1  Feeling bad or failure about yourself  1 0 1  Trouble concentrating 0 0 0  Moving slowly or fidgety/restless 0 0 1  Suicidal thoughts 0 0 0  PHQ-9 Score 4 6 5   Difficult doing work/chores Somewhat difficult Somewhat difficult Somewhat difficult    GAD 7 : Generalized Anxiety Score 09/11/2018 09/03/2018 06/25/2018  Nervous, Anxious, on Edge 2 2 3   Control/stop worrying 1 0 2  Worry too much - different things 1 1 2   Trouble relaxing 0 0 1  Restless 0 0 0  Easily annoyed or irritable 2 2 2   Afraid - awful might happen 0 0 0  Total GAD 7 Score 6 5 10   Anxiety Difficulty Somewhat  difficult Somewhat difficult Somewhat difficult      All questions at time of visit were answered - patient instructed to contact office with any additional concerns or updates.  ER/RTC precautions were reviewed with the patient.  Please note: voice recognition software was used to produce this document, and typos may escape review. Please contact Dr. Sheppard Coil for any needed clarifications.   Total encounter time: 30 minutes.

## 2019-02-21 ENCOUNTER — Other Ambulatory Visit: Payer: Self-pay | Admitting: Osteopathic Medicine

## 2019-02-21 NOTE — Telephone Encounter (Signed)
Requested medication (s) are due for refill today: yes  Requested medication (s) are on the active medication list: yes  Last refill: 01/24/2019  Future visit scheduled:yes  Notes to clinic:  this refill cannot be delegated    Requested Prescriptions  Pending Prescriptions Disp Refills   traMADol (ULTRAM) 50 MG tablet [Pharmacy Med Name: TRAMADOL HCL 50 MG TABLET] 30 tablet 0    Sig: TAKE 1 TABLET (50 MG TOTAL) BY MOUTH DAILY AS NEEDED.      Not Delegated - Analgesics:  Opioid Agonists Failed - 02/21/2019 10:06 AM      Failed - This refill cannot be delegated      Failed - Urine Drug Screen completed in last 360 days.      Passed - Valid encounter within last 6 months    Recent Outpatient Visits           3 weeks ago Diabetes mellitus type 2 in obese [facility])   [facility], Lanelle Bal, DO   2 months ago Acute cystitis without hematuria   [facility], Royetta Car, PA-C   3 months ago Return to work evaluation   [facility], Rebekah [place], MD   3 months ago Hypertension associated with diabetes [facility])   [place] [facility], Rebekah [place], MD   4 months ago Diabetes mellitus type 2 in obese [facility])   [facility], Rebekah [place], MD       Future Appointments             In 2 months Emeterio Reeve, [place]

## 2019-02-25 DIAGNOSIS — Z886 Allergy status to analgesic agent status: Secondary | ICD-10-CM | POA: Diagnosis not present

## 2019-02-25 DIAGNOSIS — E669 Obesity, unspecified: Secondary | ICD-10-CM | POA: Diagnosis not present

## 2019-02-25 DIAGNOSIS — Z794 Long term (current) use of insulin: Secondary | ICD-10-CM | POA: Diagnosis not present

## 2019-02-25 DIAGNOSIS — Z791 Long term (current) use of non-steroidal anti-inflammatories (NSAID): Secondary | ICD-10-CM | POA: Diagnosis not present

## 2019-02-25 DIAGNOSIS — Z79899 Other long term (current) drug therapy: Secondary | ICD-10-CM | POA: Diagnosis not present

## 2019-02-25 DIAGNOSIS — M797 Fibromyalgia: Secondary | ICD-10-CM | POA: Diagnosis not present

## 2019-02-25 DIAGNOSIS — R102 Pelvic and perineal pain: Secondary | ICD-10-CM | POA: Diagnosis not present

## 2019-02-25 DIAGNOSIS — E1065 Type 1 diabetes mellitus with hyperglycemia: Secondary | ICD-10-CM | POA: Diagnosis not present

## 2019-02-25 DIAGNOSIS — K769 Liver disease, unspecified: Secondary | ICD-10-CM | POA: Diagnosis not present

## 2019-02-25 DIAGNOSIS — I10 Essential (primary) hypertension: Secondary | ICD-10-CM | POA: Diagnosis not present

## 2019-02-25 DIAGNOSIS — R300 Dysuria: Secondary | ICD-10-CM | POA: Diagnosis not present

## 2019-02-25 DIAGNOSIS — Z6841 Body Mass Index (BMI) 40.0 and over, adult: Secondary | ICD-10-CM | POA: Diagnosis not present

## 2019-02-25 DIAGNOSIS — Z90710 Acquired absence of both cervix and uterus: Secondary | ICD-10-CM | POA: Diagnosis not present

## 2019-02-25 DIAGNOSIS — M549 Dorsalgia, unspecified: Secondary | ICD-10-CM | POA: Diagnosis not present

## 2019-02-25 DIAGNOSIS — R162 Hepatomegaly with splenomegaly, not elsewhere classified: Secondary | ICD-10-CM | POA: Diagnosis not present

## 2019-02-26 MED ORDER — SODIUM CHLORIDE 0.9 % IV SOLN
10.00 | INTRAVENOUS | Status: DC
Start: ? — End: 2019-02-26

## 2019-02-26 MED ORDER — GENERIC EXTERNAL MEDICATION
Status: DC
Start: ? — End: 2019-02-26

## 2019-03-06 ENCOUNTER — Other Ambulatory Visit: Payer: Self-pay | Admitting: Family Medicine

## 2019-03-07 ENCOUNTER — Encounter: Payer: Self-pay | Admitting: Osteopathic Medicine

## 2019-03-08 ENCOUNTER — Encounter: Payer: Self-pay | Admitting: Osteopathic Medicine

## 2019-03-08 NOTE — Telephone Encounter (Signed)
Pt is requesting a work note to be excused from work travel. Pls advise if appropriate to release work note for pt. Thanks.

## 2019-03-20 ENCOUNTER — Other Ambulatory Visit: Payer: Self-pay | Admitting: Osteopathic Medicine

## 2019-03-21 ENCOUNTER — Encounter: Payer: Self-pay | Admitting: Osteopathic Medicine

## 2019-04-01 ENCOUNTER — Encounter: Payer: Self-pay | Admitting: Osteopathic Medicine

## 2019-04-01 MED ORDER — GABAPENTIN 300 MG PO CAPS: 300.0000 mg | ORAL_CAPSULE | Freq: Three times a day (TID) | ORAL | 0 refills | Status: DC

## 2019-04-03 ENCOUNTER — Other Ambulatory Visit: Payer: Self-pay | Admitting: Osteopathic Medicine

## 2019-04-03 NOTE — Telephone Encounter (Signed)
Please review for refill- patient at [facility] 

## 2019-04-04 ENCOUNTER — Encounter: Payer: Self-pay | Admitting: Osteopathic Medicine

## 2019-04-04 MED ORDER — ALPRAZOLAM 1 MG PO TABS: 1.0000 mg | ORAL_TABLET | Freq: Two times a day (BID) | ORAL | 0 refills | Status: DC | PRN

## 2019-04-04 NOTE — Telephone Encounter (Signed)
Forwarding to covering provider. Last refill 03/08/2019. Rx pended.

## 2019-04-18 ENCOUNTER — Other Ambulatory Visit: Payer: Self-pay | Admitting: Osteopathic Medicine

## 2019-04-22 ENCOUNTER — Encounter: Payer: Self-pay | Admitting: Osteopathic Medicine

## 2019-04-22 MED ORDER — TRAMADOL HCL 50 MG PO TABS: 50.0000 mg | ORAL_TABLET | Freq: Every day | ORAL | 0 refills | Status: DC | PRN

## 2019-04-22 NOTE — Telephone Encounter (Signed)
Routing to provider. Rx not listed in active med list.

## 2019-04-24 ENCOUNTER — Other Ambulatory Visit: Payer: Self-pay | Admitting: Family Medicine

## 2019-04-29 ENCOUNTER — Ambulatory Visit: Payer: BC Managed Care – PPO | Admitting: Osteopathic Medicine

## 2019-04-30 ENCOUNTER — Other Ambulatory Visit: Payer: Self-pay

## 2019-04-30 ENCOUNTER — Ambulatory Visit (INDEPENDENT_AMBULATORY_CARE_PROVIDER_SITE_OTHER): Payer: BC Managed Care – PPO | Admitting: Osteopathic Medicine

## 2019-04-30 ENCOUNTER — Encounter: Payer: Self-pay | Admitting: Osteopathic Medicine

## 2019-04-30 VITALS — BP 136/89 | HR 123 | Temp 97.8°F | Wt 281.1 lb

## 2019-04-30 DIAGNOSIS — I471 Supraventricular tachycardia, unspecified: Secondary | ICD-10-CM

## 2019-04-30 DIAGNOSIS — E1169 Type 2 diabetes mellitus with other specified complication: Secondary | ICD-10-CM | POA: Diagnosis not present

## 2019-04-30 DIAGNOSIS — E669 Obesity, unspecified: Secondary | ICD-10-CM

## 2019-04-30 DIAGNOSIS — Z0000 Encounter for general adult medical examination without abnormal findings: Secondary | ICD-10-CM

## 2019-04-30 DIAGNOSIS — I10 Essential (primary) hypertension: Secondary | ICD-10-CM

## 2019-04-30 DIAGNOSIS — E1159 Type 2 diabetes mellitus with other circulatory complications: Secondary | ICD-10-CM

## 2019-04-30 DIAGNOSIS — I152 Hypertension secondary to endocrine disorders: Secondary | ICD-10-CM

## 2019-04-30 DIAGNOSIS — E119 Type 2 diabetes mellitus without complications: Secondary | ICD-10-CM

## 2019-04-30 LAB — POCT GLYCOSYLATED HEMOGLOBIN (HGB A1C): HbA1c POC (<> result, manual entry): 11.1 % (ref 4.0–5.6)

## 2019-04-30 MED ORDER — GABAPENTIN 300 MG PO CAPS: 900.0000 mg | ORAL_CAPSULE | Freq: Three times a day (TID) | ORAL | 1 refills | Status: DC

## 2019-04-30 MED ORDER — METOPROLOL SUCCINATE ER 100 MG PO TB24: 100.0000 mg | ORAL_TABLET | Freq: Every day | ORAL | 1 refills | Status: DC

## 2019-04-30 NOTE — Patient Instructions (Addendum)
For Diabetes  Continue Trulicity & Wilder Glade as you are taking them  Measure fasting blood sugar every day: goal for now is to get this to 100-130  Increase daily Insulin dose by 2-3 units at a time, twice per week, until fasting sugars are consistently 100-130, then continue at that dose  Okay to take insulin in the evenings, for that matter can take all medications in the evenings if desired!  If you forget to take your insulin, taking it 4 to 5 hours later is probably fine.   Lets work on being more consistent with medications!  I am definitely concerned that the A1c has increased and that the blood pressure is not at goal.  There is definitely some room for improvement, so let us work on it!

## 2019-04-30 NOTE — Progress Notes (Signed)
Christine Grant is a 44 y.o. female who presents to  [place] at [facility]  today, 04/30/19, seeking care for the following:  The primary encounter diagnosis was Diabetes mellitus type 2 in obese ([place]). Diagnoses of Paroxysmal supraventricular tachycardia ([place]), Hypertension associated with diabetes ([place]), and Annual physical exam were also pertinent to this visit.     ASSESSMENT & PLAN with other pertinent history/findings:  1. Diabetes mellitus type 2 ([place])  Today 04/30/19: A1C 11.1! :(  Last visit 01/29/19: A1C 9.4. Pt reports that adherence to her medication regimen is definitely a challenge.  She is not consistent about taking insulin in particular, severe GI upset with Metformin.  At that visit, we discussed slow titration up on insulin to target fasting blood glucose and importance of compliance with regular dosing.  Meds: Trulicity 1.5 mg weekly, Levemir 30 units daily. Farxiga 10 mg daily --> TITRATE UP ON INSULIN SEE PT INSTRUCTIONS   2. Paroxysmal supraventricular tachycardia (HCC)  Patient tachycardic last visit but no symptoms, patient was not sure if she has taken her metoprolol in the past few days, compliance was stressed.  3. HTN  On ACE d/t DM, on BB d/t PSVT BP Readings:  04/30/19  155/93 on intake On recheck: 136/89  01/29/19 135/90  10/29/18 116/84  10/01/18 140/90    Labs ordered for future visit. Annual physical / preventive care was NOT performed or billed today.     Orders Placed This Encounter  Procedures  . CBC  . COMPLETE METABOLIC PANEL WITH GFR  . Lipid panel  . Hemoglobin A1c  . POCT HgB A1C    Meds ordered this encounter  Medications  . gabapentin (NEURONTIN) 300 MG capsule    Sig: Take 3 capsules (900 mg total) by mouth 3 (three) times daily.    Dispense:  810 capsule    Refill:  1  . metoprolol succinate (TOPROL-XL) 100 MG 24 hr tablet    Sig: Take 1 tablet (100 mg total) by mouth  daily.    Dispense:  90 tablet    Refill:  1    Patient Instructions  For Diabetes  Continue Trulicity & Wilder Glade as you are taking them  Measure fasting blood sugar every day: goal for now is to get this to 100-130  Increase daily Insulin dose by 2-3 units at a time, twice per week, until fasting sugars are consistently 100-130, then continue at that dose  Okay to take insulin in the evenings, for that matter can take all medications in the evenings if desired!  If you forget to take your insulin, taking it 4 to 5 hours later is probably fine.   Lets work on being more consistent with medications!  I am definitely concerned that the A1c has increased and that the blood pressure is not at goal.  There is definitely some room for improvement, so let us work on it!      Follow-up instructions: Return in about 3 months (around 07/30/2019) for ANNUAL, WILL ALSO RECHECK DIABETES AND BLOOD PRESSURE (get labs prior to visit, orders are in).      .     BP 136/89 (BP Location: Left Arm, Patient Position: Sitting, Cuff Size: Large)   Pulse (!) 123   Temp 97.8 F (36.6 C) (Oral)   Wt 281 lb 1.9 oz (127.5 kg)   LMP  (LMP Unknown)   BMI 45.37 kg/m   Current Meds  Medication Sig  . ALPRAZolam (  XANAX) 1 MG tablet Take 1 tablet (1 mg total) by mouth 2 (two) times daily as needed.  . cyclobenzaprine (FLEXERIL) 10 MG tablet Take 1 tablet (10 mg total) by mouth 3 (three) times daily as needed for muscle spasms.  . Dulaglutide (TRULICITY) 1.5 0000000 SOPN Inject 0.5 mLs into the skin once a week.  Marland Kitchen FARXIGA 10 MG TABS tablet TAKE 10 MG BY MOUTH DAILY.  Marland Kitchen gabapentin (NEURONTIN) 300 MG capsule Take 3 capsules (900 mg total) by mouth 3 (three) times daily.  . Insulin Detemir (LEVEMIR FLEXTOUCH) 100 UNIT/ML Pen Inject 26 Units into the skin daily.  . Insulin Pen Needle (ADVOCATE INSULIN PEN NEEDLES) 33G X 4 MM MISC 1 each by Does not apply route daily.  Marland Kitchen lisinopril (ZESTRIL) 10 MG tablet  Take 1 tablet (10 mg total) by mouth daily.  . metoprolol succinate (TOPROL-XL) 100 MG 24 hr tablet Take 1 tablet (100 mg total) by mouth daily.  . promethazine (PHENERGAN) 25 MG tablet Take 1 tablet (25 mg total) by mouth every 6 (six) hours as needed for nausea or vomiting.  . traMADol (ULTRAM) 50 MG tablet Take 1 tablet (50 mg total) by mouth daily as needed.  . zolpidem (AMBIEN CR) 12.5 MG CR tablet TAKE 1 TABLET (12.5 MG TOTAL) BY MOUTH AT BEDTIME AS NEEDED.  . [DISCONTINUED] gabapentin (NEURONTIN) 300 MG capsule Take 1 capsule (300 mg total) by mouth 3 (three) times daily.  . [DISCONTINUED] metoprolol succinate (TOPROL-XL) 100 MG 24 hr tablet Take 1 tablet (100 mg total) by mouth daily.    Results for orders placed or performed in visit on 04/30/19 (from the past 72 hour(s))  POCT HgB A1C     Status: None   Collection Time: 04/30/19  8:52 AM  Result Value Ref Range   Hemoglobin A1C     HbA1c POC (<> result, manual entry) 11.1 4.0 - 5.6 %   HbA1c, POC (prediabetic range)     HbA1c, POC (controlled diabetic range)      No results found.  Depression screen [facility] 2/9 09/11/2018 09/03/2018 06/25/2018  Decreased Interest 1 1 0  Down, Depressed, Hopeless 1 1 1   PHQ - 2 Score 2 2 1   Altered sleeping 0 0 0  Tired, decreased energy 1 3 1   Change in appetite 0 1 1  Feeling bad or failure about yourself  1 0 1  Trouble concentrating 0 0 0  Moving slowly or fidgety/restless 0 0 1  Suicidal thoughts 0 0 0  PHQ-9 Score 4 6 5   Difficult doing work/chores Somewhat difficult Somewhat difficult Somewhat difficult    GAD 7 : Generalized Anxiety Score 09/11/2018 09/03/2018 06/25/2018  Nervous, Anxious, on Edge 2 2 3   Control/stop worrying 1 0 2  Worry too much - different things 1 1 2   Trouble relaxing 0 0 1  Restless 0 0 0  Easily annoyed or irritable 2 2 2   Afraid - awful might happen 0 0 0  Total GAD 7 Score 6 5 10   Anxiety Difficulty Somewhat difficult Somewhat difficult Somewhat difficult       All questions at time of visit were answered - patient instructed to contact office with any additional concerns or updates.  ER/RTC precautions were reviewed with the patient.  Please note: voice recognition software was used to produce this document, and typos may escape review. Please contact Dr. Sheppard Coil for any needed clarifications.   Total encounter time: 30 minutes.

## 2019-05-02 ENCOUNTER — Other Ambulatory Visit: Payer: Self-pay | Admitting: Medical-Surgical

## 2019-05-02 NOTE — Telephone Encounter (Signed)
Routing to PCP

## 2019-05-03 ENCOUNTER — Encounter: Payer: Self-pay | Admitting: Osteopathic Medicine

## 2019-05-10 DIAGNOSIS — Z794 Long term (current) use of insulin: Secondary | ICD-10-CM | POA: Diagnosis not present

## 2019-05-10 DIAGNOSIS — I10 Essential (primary) hypertension: Secondary | ICD-10-CM | POA: Diagnosis not present

## 2019-05-10 DIAGNOSIS — R9431 Abnormal electrocardiogram [ECG] [EKG]: Secondary | ICD-10-CM | POA: Diagnosis not present

## 2019-05-10 DIAGNOSIS — R000 Tachycardia, unspecified: Secondary | ICD-10-CM | POA: Diagnosis not present

## 2019-05-10 DIAGNOSIS — M5136 Other intervertebral disc degeneration, lumbar region: Secondary | ICD-10-CM | POA: Diagnosis not present

## 2019-05-10 DIAGNOSIS — M797 Fibromyalgia: Secondary | ICD-10-CM | POA: Diagnosis not present

## 2019-05-10 DIAGNOSIS — E119 Type 2 diabetes mellitus without complications: Secondary | ICD-10-CM | POA: Diagnosis not present

## 2019-05-10 DIAGNOSIS — R110 Nausea: Secondary | ICD-10-CM | POA: Diagnosis not present

## 2019-05-10 DIAGNOSIS — Z79899 Other long term (current) drug therapy: Secondary | ICD-10-CM | POA: Diagnosis not present

## 2019-05-10 DIAGNOSIS — R1031 Right lower quadrant pain: Secondary | ICD-10-CM | POA: Diagnosis not present

## 2019-05-10 DIAGNOSIS — Z885 Allergy status to narcotic agent status: Secondary | ICD-10-CM | POA: Diagnosis not present

## 2019-05-10 DIAGNOSIS — N83201 Unspecified ovarian cyst, right side: Secondary | ICD-10-CM | POA: Diagnosis not present

## 2019-05-10 DIAGNOSIS — R188 Other ascites: Secondary | ICD-10-CM | POA: Diagnosis not present

## 2019-05-10 DIAGNOSIS — R079 Chest pain, unspecified: Secondary | ICD-10-CM | POA: Diagnosis not present

## 2019-05-11 DIAGNOSIS — R102 Pelvic and perineal pain: Secondary | ICD-10-CM | POA: Diagnosis not present

## 2019-05-11 DIAGNOSIS — Z90710 Acquired absence of both cervix and uterus: Secondary | ICD-10-CM | POA: Diagnosis not present

## 2019-05-11 MED ORDER — GENERIC EXTERNAL MEDICATION
Status: DC
Start: ? — End: 2019-05-11

## 2019-05-11 MED ORDER — SODIUM CHLORIDE 0.9 % IV SOLN
10.00 | INTRAVENOUS | Status: DC
Start: ? — End: 2019-05-11

## 2019-05-20 ENCOUNTER — Encounter: Payer: Self-pay | Admitting: Medical-Surgical

## 2019-05-20 ENCOUNTER — Telehealth (INDEPENDENT_AMBULATORY_CARE_PROVIDER_SITE_OTHER): Payer: BC Managed Care – PPO | Admitting: Medical-Surgical

## 2019-05-20 VITALS — HR 94 | Temp 98.1°F

## 2019-05-20 DIAGNOSIS — R300 Dysuria: Secondary | ICD-10-CM

## 2019-05-20 DIAGNOSIS — E669 Obesity, unspecified: Secondary | ICD-10-CM

## 2019-05-20 DIAGNOSIS — E1169 Type 2 diabetes mellitus with other specified complication: Secondary | ICD-10-CM

## 2019-05-20 DIAGNOSIS — R112 Nausea with vomiting, unspecified: Secondary | ICD-10-CM | POA: Diagnosis not present

## 2019-05-20 MED ORDER — PROMETHAZINE HCL 25 MG PO TABS: 25.0000 mg | ORAL_TABLET | Freq: Four times a day (QID) | ORAL | 2 refills | Status: DC | PRN

## 2019-05-20 MED ORDER — NITROFURANTOIN MONOHYD MACRO 100 MG PO CAPS: 100.0000 mg | ORAL_CAPSULE | Freq: Two times a day (BID) | ORAL | 0 refills | Status: DC

## 2019-05-20 MED ORDER — PHENAZOPYRIDINE HCL 100 MG PO TABS: 100.0000 mg | ORAL_TABLET | Freq: Three times a day (TID) | ORAL | 0 refills | Status: DC | PRN

## 2019-05-20 NOTE — Progress Notes (Signed)
Virtual Visit via Video Note  I connected with Christine Grant on 2019/06/15 at 10:10 AM EDT by a video enabled telemedicine application and verified that I am speaking with the correct person using two identifiers.   I discussed the limitations of evaluation and management by telemedicine and the availability of in person appointments. The patient expressed understanding and agreed to proceed.  Subjective:    CC: nausea/vomiting, dysuria  HPI: Pleasant 44 year old female presenting via MyChart video visit for nausea/vomiting and dysuria.  Nausea/vomiting-reports 7 days of nausea with forceful vomiting on Tuesday and Wednesday.  No further vomiting since Wednesday afternoon but has had continuous nausea since then.  She is now eating and drinking without difficulty.  Reports having a small amount of diarrhea with nausea and vomiting but this has since resolved. + Intermittent chills, fatigue, headache.  No fever.  Endorses discomfort in the center of her chest and along her ribs since her vomiting episodes.  Dysuria-started late Saturday evening/early Sunday morning.  Burning with urination accompanied by cloudy urine, pelvic pain, and urinary retention.  No blood visualized.  Has taken Tylenol with no relief.  Having a hard time controlling her blood sugars.  Fasting numbers are in the upper 200s to 300s.  Was off of Trulicity for a while and then restarted it.  She did not take her dose on Wednesday as she has had a nausea/vomiting reaction to it in the past and did not want to exacerbate her current symptoms.  Past medical history, Surgical history, Family history not pertinant except as noted below, Social history, Allergies, and medications have been entered into the medical record, reviewed, and corrections made.   Review of Systems: See HPI for pertinent positives and negatives.  Objective:    General: Speaking clearly in complete sentences without any shortness of breath.  Alert and  oriented x3.  Normal judgment. No apparent acute distress.  Impression and Recommendations:    1. Nausea and vomiting, intractability of vomiting not specified, unspecified vomiting type Suspect viral gastroenteritis.  Sending in promethazine 25 mg every 6 hours as needed.  Encouraged continued hydration. - promethazine (PHENERGAN) 25 MG tablet; Take 1 tablet (25 mg total) by mouth every 6 (six) hours as needed for nausea or vomiting.  Dispense: 30 tablet; Refill: 2  2. Dysuria Unable to run urinalysis due to virtual nature of visit.  Suspect dehydration but with elevated glucose, likely UTI.  Treating empirically with nitrofurantoin twice daily x7 days.  Pyridium 3 times daily as needed for urinary discomfort. - nitrofurantoin, macrocrystal-monohydrate, (MACROBID) 100 MG capsule; Take 1 capsule (100 mg total) by mouth 2 (two) times daily.  Dispense: 14 capsule; Refill: 0 - phenazopyridine (PYRIDIUM) 100 MG tablet; Take 1 tablet (100 mg total) by mouth 3 (three) times daily as needed for pain.  Dispense: 15 tablet; Refill: 0  3. Diabetes mellitus type 2 in obese [facility]) Patient has significant concerns regarding health management and diabetes control.  Feels that she may need to take some time away from work to be able to get her health under control.  Strongly recommend discussing glucose control and concerns with PCP.   I discussed the assessment and treatment plan with the patient. The patient was provided an opportunity to ask questions and all were answered. The patient agreed with the plan and demonstrated an understanding of the instructions.   The patient was advised to call back or seek an in-person evaluation if the symptoms worsen or if the condition fails to  improve as anticipated.  Return if symptoms worsen or fail to improve.  25 minutes of non-face-to-face time was provided during this encounter.  Clearnce Sorrel, DNP, APRN, FNP-BC [facility]

## 2019-05-21 ENCOUNTER — Ambulatory Visit: Payer: BC Managed Care – PPO | Admitting: Osteopathic Medicine

## 2019-05-21 ENCOUNTER — Encounter: Payer: Self-pay | Admitting: Osteopathic Medicine

## 2019-05-21 ENCOUNTER — Other Ambulatory Visit: Payer: Self-pay

## 2019-05-21 VITALS — BP 137/92 | HR 118 | Temp 97.7°F | Wt 276.0 lb

## 2019-05-21 DIAGNOSIS — R110 Nausea: Secondary | ICD-10-CM

## 2019-05-21 DIAGNOSIS — E669 Obesity, unspecified: Secondary | ICD-10-CM | POA: Diagnosis not present

## 2019-05-21 DIAGNOSIS — F339 Major depressive disorder, recurrent, unspecified: Secondary | ICD-10-CM | POA: Diagnosis not present

## 2019-05-21 DIAGNOSIS — E1169 Type 2 diabetes mellitus with other specified complication: Secondary | ICD-10-CM | POA: Diagnosis not present

## 2019-05-21 MED ORDER — TRULICITY 0.75 MG/0.5ML ~~LOC~~ SOAJ: 0.5000 mL | SUBCUTANEOUS | 11 refills | Status: DC

## 2019-05-21 MED ORDER — OMEPRAZOLE 40 MG PO CPDR
40.0000 mg | DELAYED_RELEASE_CAPSULE | Freq: Every day | ORAL | 0 refills | Status: DC
Start: 2019-05-21 — End: 2019-06-13

## 2019-05-21 MED ORDER — TRULICITY 1.5 MG/0.5ML ~~LOC~~ SOAJ: 0.2500 mL | SUBCUTANEOUS | 5 refills | Status: DC

## 2019-05-21 MED ORDER — TRAMADOL HCL 50 MG PO TABS: 50.0000 mg | ORAL_TABLET | Freq: Every day | ORAL | 0 refills | Status: DC | PRN

## 2019-05-21 MED ORDER — FLUOXETINE HCL 10 MG PO CAPS: ORAL_CAPSULE | ORAL | 0 refills | Status: DC

## 2019-05-21 NOTE — Patient Instructions (Addendum)
Plan:  Starting fluoxetine / Prozac for mental health   Will reduce Trulicity dose to avoid nausea from 1.5 to 0.75  Continue Phenergan as needed for nausea  Will add antacid medication for now   Letter for work - see MyChart later today

## 2019-05-21 NOTE — Progress Notes (Signed)
Christine Grant is a 44 y.o. female who presents to  [place] at [facility]  today, 05/21/19, seeking care for the following: . N/V, f/u dysuria (taking abx for UTI), saw NP on virtual visit yesterday. Skipped Trulicity d/t NV didn't want to make this worse. Joy suspected viral GI issue, Rx phenergan for symptomatic relief, sent macrobid and azo for presumed UTI given poor Glc control.  . Last visit A1C 11+ 04/2018. Not c/w insulin administration (Levemir 30 units daily) but pt states doing better taking meds in PM . Today feels improved but not resolved. Concerned about traveling a lot for work and feels would be advantageous to work from home. Concerned about mental health.   BP Readings from Last 3 Encounters:  05/21/19 (!) 137/92  04/30/19 136/89  01/29/19 135/90       ASSESSMENT & PLAN with other pertinent history/findings:  The primary encounter diagnosis was Depression, recurrent ([place]). Diagnoses of Diabetes mellitus type 2 in obese (HCC) and Nausea were also pertinent to this visit.     Patient Instructions  Plan:  Starting fluoxetine / Prozac for mental health   Will reduce Trulicity dose to avoid nausea from 1.5 to 0.75  Continue Phenergan as needed for nausea  Will add antacid medication for now   Letter for work - see MyChart later today      Meds ordered this encounter  Medications  . FLUoxetine (PROZAC) 10 MG capsule    Sig: Take 1 capsule (10 mg total) by mouth daily for 7 days, THEN 2 capsules (20 mg total) daily.    Dispense:  90 capsule    Refill:  0  . omeprazole (PRILOSEC) 40 MG capsule    Sig: Take 1 capsule (40 mg total) by mouth daily. For 4-6 weeks    Dispense:  90 capsule    Refill:  0  . traMADol (ULTRAM) 50 MG tablet    Sig: Take 1 tablet (50 mg total) by mouth daily as needed.    Dispense:  30 tablet    Refill:  0    Not to exceed 4 additional fills before 08/20/2019  . DISCONTD:  Dulaglutide (TRULICITY) 1.5 0000000 SOPN    Sig: Inject 0.25 mLs into the skin once a week.    Dispense:  2 pen    Refill:  5  . Dulaglutide (TRULICITY) A999333 0000000 SOPN    Sig: Inject 0.5 mLs into the skin once a week.    Dispense:  4 pen    Refill:  11    Patient will have discount coupon       Follow-up instructions: Return in about 4 weeks (around 06/18/2019) for [place], see me sooner if needed.                                         BP (!) 137/92 (BP Location: Left Arm, Patient Position: Sitting, Cuff Size: Large)   Pulse (!) 118   Temp 97.7 F (36.5 C) (Oral)   Wt 276 lb 0.6 oz (125.2 kg)   LMP  (LMP Unknown)   BMI 44.55 kg/m   Current Meds  Medication Sig  . acetaminophen (TYLENOL) 500 MG tablet Take 500 mg by mouth every 6 (six) hours as needed.  . ALPRAZolam (XANAX) 1 MG tablet TAKE 1 TABLET (1 MG TOTAL) BY MOUTH  2 (TWO) TIMES DAILY AS NEEDED.  Marland Kitchen cyclobenzaprine (FLEXERIL) 10 MG tablet Take 1 tablet (10 mg total) by mouth 3 (three) times daily as needed for muscle spasms.  Marland Kitchen FARXIGA 10 MG TABS tablet TAKE 10 MG BY MOUTH DAILY.  Marland Kitchen gabapentin (NEURONTIN) 300 MG capsule Take 3 capsules (900 mg total) by mouth 3 (three) times daily.  . Insulin Detemir (LEVEMIR FLEXTOUCH) 100 UNIT/ML Pen Inject 26 Units into the skin daily.  . Insulin Pen Needle (ADVOCATE INSULIN PEN NEEDLES) 33G X 4 MM MISC 1 each by Does not apply route daily.  Marland Kitchen lisinopril (ZESTRIL) 10 MG tablet Take 1 tablet (10 mg total) by mouth daily.  . metoprolol succinate (TOPROL-XL) 100 MG 24 hr tablet Take 1 tablet (100 mg total) by mouth daily.  . nitrofurantoin, macrocrystal-monohydrate, (MACROBID) 100 MG capsule Take 1 capsule (100 mg total) by mouth 2 (two) times daily.  . phenazopyridine (PYRIDIUM) 100 MG tablet Take 1 tablet (100 mg total) by mouth 3 (three) times daily as needed for pain.  . promethazine (PHENERGAN) 25 MG tablet Take  1 tablet (25 mg total) by mouth every 6 (six) hours as needed for nausea or vomiting.  . traMADol (ULTRAM) 50 MG tablet Take 1 tablet (50 mg total) by mouth daily as needed.  . zolpidem (AMBIEN CR) 12.5 MG CR tablet TAKE 1 TABLET (12.5 MG TOTAL) BY MOUTH AT BEDTIME AS NEEDED.  . [DISCONTINUED] Dulaglutide (TRULICITY) 1.5 0000000 SOPN Inject 0.5 mLs into the skin once a week.  . [DISCONTINUED] Dulaglutide (TRULICITY) 1.5 0000000 SOPN Inject 0.25 mLs into the skin once a week.  . [DISCONTINUED] traMADol (ULTRAM) 50 MG tablet Take 1 tablet (50 mg total) by mouth daily as needed.    No results found for this or any previous visit (from the past 72 hour(s)).  No results found.  Depression screen [facility] 2/9 09/11/2018 09/03/2018 06/25/2018  Decreased Interest 1 1 0  Down, Depressed, Hopeless 1 1 1   PHQ - 2 Score 2 2 1   Altered sleeping 0 0 0  Tired, decreased energy 1 3 1   Change in appetite 0 1 1  Feeling bad or failure about yourself  1 0 1  Trouble concentrating 0 0 0  Moving slowly or fidgety/restless 0 0 1  Suicidal thoughts 0 0 0  PHQ-9 Score 4 6 5   Difficult doing work/chores Somewhat difficult Somewhat difficult Somewhat difficult    GAD 7 : Generalized Anxiety Score 09/11/2018 09/03/2018 06/25/2018  Nervous, Anxious, on Edge 2 2 3   Control/stop worrying 1 0 2  Worry too much - different things 1 1 2   Trouble relaxing 0 0 1  Restless 0 0 0  Easily annoyed or irritable 2 2 2   Afraid - awful might happen 0 0 0  Total GAD 7 Score 6 5 10   Anxiety Difficulty Somewhat difficult Somewhat difficult Somewhat difficult      All questions at time of visit were answered - patient instructed to contact office with any additional concerns or updates.  ER/RTC precautions were reviewed with the patient.  Please note: voice recognition software was used to produce this document, and typos may escape review. Please contact Dr. Sheppard Coil for any needed clarifications.

## 2019-05-22 ENCOUNTER — Encounter: Payer: Self-pay | Admitting: Osteopathic Medicine

## 2019-05-22 NOTE — Telephone Encounter (Signed)
Appt note states for patient to see MyChart for note later tonight  Did not see note

## 2019-05-27 ENCOUNTER — Other Ambulatory Visit: Payer: Self-pay | Admitting: Physician Assistant

## 2019-05-27 ENCOUNTER — Encounter: Payer: Self-pay | Admitting: Osteopathic Medicine

## 2019-05-28 NOTE — Telephone Encounter (Signed)
Routing to provider. FMLA form placed in provider's box for completion.

## 2019-05-29 ENCOUNTER — Encounter: Payer: Self-pay | Admitting: Osteopathic Medicine

## 2019-05-29 MED ORDER — GABAPENTIN 300 MG PO CAPS: 900.0000 mg | ORAL_CAPSULE | Freq: Three times a day (TID) | ORAL | 3 refills | Status: DC

## 2019-05-29 NOTE — Telephone Encounter (Signed)
Routing to provider to amend rx. Last rx sent on 05/27/19. Pt has not pick up the rx from the pharmacy.

## 2019-05-30 ENCOUNTER — Other Ambulatory Visit: Payer: Self-pay | Admitting: Osteopathic Medicine

## 2019-05-31 ENCOUNTER — Encounter: Payer: Self-pay | Admitting: Osteopathic Medicine

## 2019-06-03 NOTE — Telephone Encounter (Signed)
[facility] pharmacy requesting med refill for alprazolam (last written on 05/03/19) & zolpidem (last written on 04/03/19).

## 2019-06-07 ENCOUNTER — Encounter: Payer: Self-pay | Admitting: Osteopathic Medicine

## 2019-06-11 DIAGNOSIS — I10 Essential (primary) hypertension: Secondary | ICD-10-CM | POA: Diagnosis not present

## 2019-06-11 DIAGNOSIS — Z888 Allergy status to other drugs, medicaments and biological substances status: Secondary | ICD-10-CM | POA: Diagnosis not present

## 2019-06-11 DIAGNOSIS — R160 Hepatomegaly, not elsewhere classified: Secondary | ICD-10-CM | POA: Diagnosis not present

## 2019-06-11 DIAGNOSIS — R1031 Right lower quadrant pain: Secondary | ICD-10-CM | POA: Diagnosis not present

## 2019-06-11 DIAGNOSIS — R519 Headache, unspecified: Secondary | ICD-10-CM | POA: Diagnosis not present

## 2019-06-11 DIAGNOSIS — E1165 Type 2 diabetes mellitus with hyperglycemia: Secondary | ICD-10-CM | POA: Diagnosis not present

## 2019-06-11 DIAGNOSIS — Z79899 Other long term (current) drug therapy: Secondary | ICD-10-CM | POA: Diagnosis not present

## 2019-06-11 DIAGNOSIS — K760 Fatty (change of) liver, not elsewhere classified: Secondary | ICD-10-CM | POA: Diagnosis not present

## 2019-06-11 DIAGNOSIS — R112 Nausea with vomiting, unspecified: Secondary | ICD-10-CM | POA: Diagnosis not present

## 2019-06-11 DIAGNOSIS — K5900 Constipation, unspecified: Secondary | ICD-10-CM | POA: Diagnosis not present

## 2019-06-11 DIAGNOSIS — Z794 Long term (current) use of insulin: Secondary | ICD-10-CM | POA: Diagnosis not present

## 2019-06-11 DIAGNOSIS — R1084 Generalized abdominal pain: Secondary | ICD-10-CM | POA: Diagnosis not present

## 2019-06-11 DIAGNOSIS — M797 Fibromyalgia: Secondary | ICD-10-CM | POA: Diagnosis not present

## 2019-06-11 MED ORDER — GENERIC EXTERNAL MEDICATION
Status: DC
Start: ? — End: 2019-06-11

## 2019-06-11 MED ORDER — SODIUM CHLORIDE 0.9 % IV SOLN
10.00 | INTRAVENOUS | Status: DC
Start: ? — End: 2019-06-11

## 2019-06-12 ENCOUNTER — Other Ambulatory Visit: Payer: Self-pay | Admitting: Osteopathic Medicine

## 2019-06-12 MED ORDER — GENERIC EXTERNAL MEDICATION
Status: DC
Start: ? — End: 2019-06-12

## 2019-06-13 ENCOUNTER — Other Ambulatory Visit: Payer: Self-pay

## 2019-06-13 ENCOUNTER — Ambulatory Visit (INDEPENDENT_AMBULATORY_CARE_PROVIDER_SITE_OTHER): Payer: BC Managed Care – PPO | Admitting: Obstetrics and Gynecology

## 2019-06-13 ENCOUNTER — Encounter: Payer: Self-pay | Admitting: Obstetrics and Gynecology

## 2019-06-13 VITALS — BP 151/90 | HR 135 | Resp 16 | Ht 66.0 in | Wt 277.0 lb

## 2019-06-13 DIAGNOSIS — R102 Pelvic and perineal pain: Secondary | ICD-10-CM | POA: Diagnosis not present

## 2019-06-13 DIAGNOSIS — Z90710 Acquired absence of both cervix and uterus: Secondary | ICD-10-CM | POA: Diagnosis not present

## 2019-06-13 MED ORDER — GENERIC EXTERNAL MEDICATION
Status: DC
Start: ? — End: 2019-06-13

## 2019-06-13 NOTE — Progress Notes (Signed)
GYNECOLOGY OFFICE FOLLOW UP NOTE  History:  44 y.o. [id number] here today for follow up for right pelvic pain with ovarian cysts. Patient is s/p hysterectomy. Had left oophorectomy with laparotomy at [facility] for ovarian torsion with ovary encapsulated within sigmoid colon.  She continues to have ongoing pain in right pelvis. She has a long history of ovarian cysts and because of significant, urgent pain, she worries every time she has it that she has an acute issue or bleeding. Is tired of having to go to Urgent care and ED whenever she is having pain.  Past Medical History:  Diagnosis Date  . Abnormal uterine bleeding   . Anxiety   . Arthritis    knees  . Chronic pelvic pain in female   . Depression   . Endometriosis of pelvis   . Fibromyalgia   . History of ketoacidosis    01-15-2014  . PONV (postoperative nausea and vomiting)   . Sinus tachycardia   . Type 2 diabetes mellitus ([place])   . Wears contact lenses     Past Surgical History:  Procedure Laterality Date  . ABDOMINAL HYSTERECTOMY    . BREAST REDUCTION SURGERY  age 59  . CESAREAN SECTION  2002,  2005,  2008  . DILATION AND CURETTAGE OF UTERUS N/A 02/13/2014   Procedure: DILATATION AND CURETTAGE;  Surgeon: Everitt Amber, MD;  Location: [facility];  Service: Gynecology;  Laterality: N/A;  . LEFT OOPHORECTOMY  2019  . MANDIBLE SURGERY  age 16   Correct overbite  . REDUCTION MAMMAPLASTY       Current Outpatient Medications:  .  ALPRAZolam (XANAX) 1 MG tablet, TAKE 1 TABLET (1 MG TOTAL) BY MOUTH 2 (TWO) TIMES DAILY AS NEEDED., Disp: 60 tablet, Rfl: 2 .  cyclobenzaprine (FLEXERIL) 10 MG tablet, Take 1 tablet (10 mg total) by mouth 3 (three) times daily as needed for muscle spasms., Disp: 270 tablet, Rfl: 1 .  Dulaglutide (TRULICITY) [id number] [id number] SOPN, Inject 0.5 mLs into the skin once a week., Disp: 4 pen, Rfl: 11 .  FARXIGA 10 MG TABS tablet, TAKE 10 MG BY MOUTH DAILY., Disp: 90 tablet, Rfl: 1 .  gabapentin  (NEURONTIN) 300 MG capsule, Take 3 capsules (900 mg total) by mouth 3 (three) times daily., Disp: 810 capsule, Rfl: 3 .  Insulin Detemir (LEVEMIR FLEXTOUCH) 100 UNIT/ML Pen, Inject 26 Units into the skin daily., Disp: 10 pen, Rfl: 12 .  Insulin Pen Needle (ADVOCATE INSULIN PEN NEEDLES) 33G X 4 MM MISC, 1 each by Does not apply route daily., Disp: 100 each, Rfl: 12 .  lisinopril (ZESTRIL) 10 MG tablet, Take 1 tablet (10 mg total) by mouth daily., Disp: 90 tablet, Rfl: 1 .  metoprolol succinate (TOPROL-XL) 100 MG 24 hr tablet, Take 1 tablet (100 mg total) by mouth daily., Disp: 90 tablet, Rfl: 1 .  promethazine (PHENERGAN) 25 MG tablet, Take 1 tablet (25 mg total) by mouth every 6 (six) hours as needed for nausea or vomiting., Disp: 30 tablet, Rfl: 2 .  zolpidem (AMBIEN CR) 12.5 MG CR tablet, TAKE 1 TABLET (12.5 MG TOTAL) BY MOUTH AT BEDTIME AS NEEDED., Disp: 30 tablet, Rfl: 2 .  acetaminophen (TYLENOL) 500 MG tablet, Take 500 mg by mouth every 6 (six) hours as needed., Disp: , Rfl:  .  cephALEXin (KEFLEX) 500 MG capsule, Take 1 capsule (500 mg total) by mouth 2 (two) times daily for 7 days., Disp: 14 capsule, Rfl: 0 .  traMADol (ULTRAM) 50  MG tablet, Take 1 tablet (50 mg total) by mouth daily as needed., Disp: 30 tablet, Rfl: 0  The following portions of the patient's history were reviewed and updated as appropriate: allergies, current medications, past family history, past medical history, past social history, past surgical history and problem list.   Review of Systems:  Pertinent items noted in HPI and remainder of comprehensive ROS otherwise negative.   Objective:  Physical Exam BP (!) 151/90   Pulse (!) 135   Resp 16   Ht 5\' 6"  (1.676 m)   Wt 277 lb (125.6 kg)   LMP  (LMP Unknown)   BMI 44.71 kg/m  CONSTITUTIONAL: Well-developed, well-nourished female in no acute distress.  HENT:  Normocephalic, atraumatic. External right and left ear normal. Oropharynx is clear and moist EYES:  Conjunctivae and EOM are normal. Pupils are equal, round, and reactive to light. No scleral icterus.  NECK: Normal range of motion, supple, no masses SKIN: Skin is warm and dry. No rash noted. Not diaphoretic. No erythema. No pallor. NEUROLOGIC: Alert and oriented to person, place, and time. Normal reflexes, muscle tone coordination. No cranial nerve deficit noted. PSYCHIATRIC: Normal mood and affect. Normal behavior. Normal judgment and thought content. CARDIOVASCULAR: Normal heart rate noted RESPIRATORY: Effort  normal, no problems with respiration noted ABDOMEN: Soft, no distention noted.   PELVIC: deferred MUSCULOSKELETAL: Normal range of motion. No edema noted.   Labs and Imaging CT ABDOMEN PELVIS W CONTRAST  Result Date: 06/16/2019 CLINICAL DATA:  Right lower quadrant abdominal pain, nausea and vomiting. EXAM: CT ABDOMEN AND PELVIS WITH CONTRAST TECHNIQUE: Multidetector CT imaging of the abdomen and pelvis was performed using the standard protocol following bolus administration of intravenous contrast. CONTRAST:  147mL OMNIPAQUE IOHEXOL 300 MG/ML  SOLN COMPARISON:  Ultrasound 10/27/2015, CT 03/05/2015 FINDINGS: Lower chest: Atelectatic changes in the otherwise clear lung bases. Trace pericardial fluid similar to prior and likely within physiologic normal. Normal cardiac size. Included mediastinum is otherwise unremarkable. Hepatobiliary: Ill-defined 1.5 cm hypoattenuating lesion in the posterior segment IV (2/26, 5/75). No progressive enhancement on delayed imaging. Not discernible on prior imaging. Favor benign hepatic cyst however incompletely characterized on this exam. No other focal liver lesions. Smooth liver surface contour. Normal hepatic attenuation. No gallstones, gallbladder wall thickening, or biliary dilatation. Pancreas: Unremarkable. No pancreatic ductal dilatation or surrounding inflammatory changes. Spleen: Normal in size without focal abnormality. Adrenals/Urinary Tract: Normal  adrenal glands. Kidneys are normally located with symmetric enhancement and excretion. No suspicious renal lesion, urolithiasis or hydronephrosis. Chronic scarring along the anterior bladder. Urinary bladder is otherwise unremarkable. Stomach/Bowel: Distal esophagus, stomach and duodenal sweep are unremarkable. No small bowel wall thickening or dilatation. No evidence of obstruction. Tip of the appendix is borderline dilated at 8 mm with very little focal inflammation only seen on coronal imaging (5/102). No extraluminal gas or fluid collection is seen. No colonic dilatation or wall thickening. Vascular/Lymphatic: No significant vascular findings are present. No enlarged abdominal or pelvic lymph nodes. Reproductive: Patient is post hysterectomy and left oophorectomy. Retained right ovarian tissue without acute abnormality or worrisome adnexal lesion. Other: Postsurgical changes of the low anterior pelvic wall compatible prior surgical change. No bowel containing hernia. No abdominopelvic free air or fluid. Musculoskeletal: No acute osseous abnormality or suspicious osseous lesion. Stable Schmorl's node formation at T10. Vertebral body hemangioma at L1. Mild degenerative changes of the spine hips and pelvis. IMPRESSION: 1. Tip of the appendix is borderline dilated at 8 mm with very minimal inflammation. Could reflect  early or developing appendicitis. 2. Ill-defined 1.5 cm hypoattenuating lesion in the posterior segment IV (2/26, 5/75). No progressive enhancement on delayed imaging. Favor benign hepatic cyst however incompletely characterized on this exam. Could consider further evaluation with nonemergent liver MRI. 3. Chronic scarring along the anterior bladder and in the soft tissues of the low anterior pelvic wall. 4. Prior hysterectomy and left oophorectomy. Retained right ovarian tissue without acute abnormality or worrisome adnexal lesion. Electronically Signed   By: Lovena Le M.D.   On: 06/16/2019 01:43    US PELVIC COMPLETE W TRANSVAGINAL AND TORSION R/O  Result Date: 06/16/2019 CLINICAL DATA:  Right lower quadrant pain for 1 day, history of abdominal hysterectomy and left oophorectomy EXAM: TRANSABDOMINAL AND TRANSVAGINAL ULTRASOUND OF PELVIS DOPPLER ULTRASOUND OF OVARIES TECHNIQUE: Both transabdominal and transvaginal ultrasound examinations of the pelvis were performed. Transabdominal technique was performed for global imaging of the pelvis including uterus, ovaries, adnexal regions, and pelvic cul-de-sac. It was necessary to proceed with endovaginal exam following the transabdominal exam to visualize the right ovary and adnexa. Color and duplex Doppler ultrasound was utilized to evaluate blood flow to the ovaries. COMPARISON:  CT abdomen and pelvis 06/16/2019, pelvic ultrasound 02/04/2018 FINDINGS: Uterus Surgically absent. Endometrium Surgically absent. Right ovary Measurements: 3.1 x 2.3 x 2.6 cm = volume: 9.6 mL. Normal appearance of the right ovary with small anechoic follicle measuring 1.2 cm in size. No concerning adnexal lesions. There has been interval involution of a previously seen hemorrhagic cyst. Left ovary Surgically absent. Pulsed Doppler evaluation of the right ovary demonstrates normal low-resistance arterial and venous waveforms. Other findings No abnormal free fluid. IMPRESSION: Surgical absence of the uterus and left ovary. Normal sonographic appearance and Doppler assessment of the right ovary. Specifically, no evidence of ovarian torsion or concerning adnexal lesion with involution of the previously seen right adnexal hemorrhagic cyst from prior sonography. Electronically Signed   By: Lovena Le M.D.   On: 06/16/2019 03:47    Assessment & Plan:   1. Pelvic pain Pt with long history of ovarian cysts, s/p hysterectomy and left oophorectomy. Requesting right oophorectomy today. States she has ongoing pain and is tired of having the pain as well as the anxiety/worry about whether  she will need emergency surgery each time she has right lower quadrant pain - reviewed risks of right oophorectomy including risks of surgery including infection, hemorrhage, damage to surrounding tissue and organs, inability to complete surgery due to adhesions, increased risks of adhesions due to prior surgeries, risks of surgical menopause including all cause morbidity/mortality - reviewed that removal of ovary may not improve pelvic pain as pain could also be due to adhesions, GI issues, etc - reviewed that improving anxiety surrounding pelvic pain may improve overall outlook as she states that anxiety about pain is partially reason why she is requesting removal - needs to improve A1c prior to any surgery as diabetes is not under good control - will plan for improvement in A1c, to see PCP for anxiety management and return to office 2-3 months for follow up - pt is agreeable to plan  Routine preventative health maintenance measures emphasized. Please refer to After Visit Summary for other counseling recommendations.   Return in about 2 months (around 08/13/2019) for Followup.  Total face-to-face time with patient: 22 minutes. Over 50% of encounter was spent on counseling and coordination of care.  Feliz Beam, M.D. Attending [facility] [employer] [facility])

## 2019-06-15 ENCOUNTER — Encounter (HOSPITAL_COMMUNITY): Payer: Self-pay | Admitting: Emergency Medicine

## 2019-06-15 ENCOUNTER — Other Ambulatory Visit: Payer: Self-pay

## 2019-06-15 ENCOUNTER — Emergency Department (HOSPITAL_COMMUNITY)
Admission: EM | Admit: 2019-06-15 | Discharge: 2019-06-16 | Disposition: A | Discharge status: Home / Self Care | Payer: BC Managed Care – PPO | Attending: Emergency Medicine | Admitting: Emergency Medicine

## 2019-06-15 DIAGNOSIS — Z20822 Contact with and (suspected) exposure to covid-19: Secondary | ICD-10-CM | POA: Diagnosis not present

## 2019-06-15 DIAGNOSIS — N390 Urinary tract infection, site not specified: Secondary | ICD-10-CM | POA: Diagnosis not present

## 2019-06-15 DIAGNOSIS — R1031 Right lower quadrant pain: Secondary | ICD-10-CM

## 2019-06-15 DIAGNOSIS — M791 Myalgia, unspecified site: Secondary | ICD-10-CM | POA: Diagnosis not present

## 2019-06-15 DIAGNOSIS — R519 Headache, unspecified: Secondary | ICD-10-CM | POA: Insufficient documentation

## 2019-06-15 DIAGNOSIS — E1159 Type 2 diabetes mellitus with other circulatory complications: Secondary | ICD-10-CM | POA: Insufficient documentation

## 2019-06-15 DIAGNOSIS — Z794 Long term (current) use of insulin: Secondary | ICD-10-CM | POA: Insufficient documentation

## 2019-06-15 DIAGNOSIS — I10 Essential (primary) hypertension: Secondary | ICD-10-CM | POA: Insufficient documentation

## 2019-06-15 DIAGNOSIS — R000 Tachycardia, unspecified: Secondary | ICD-10-CM | POA: Diagnosis not present

## 2019-06-15 DIAGNOSIS — Z79899 Other long term (current) drug therapy: Secondary | ICD-10-CM | POA: Diagnosis not present

## 2019-06-15 LAB — URINALYSIS, ROUTINE W REFLEX MICROSCOPIC
Bilirubin Urine: NEGATIVE
Glucose, UA: >=500 mg/dL — AB
Hgb urine dipstick: NEGATIVE
Ketones, ur: NEGATIVE mg/dL
Nitrite: NEGATIVE
Protein, ur: NEGATIVE mg/dL
Specific Gravity, Urine: 1.025 (ref 1.005–1.030)
pH: 5 (ref 5.0–8.0)

## 2019-06-15 MED ORDER — PROMETHAZINE HCL 25 MG/ML IJ SOLN: 12.5000 mg | Freq: Once | INTRAMUSCULAR | Status: DC

## 2019-06-15 MED ORDER — SODIUM CHLORIDE 0.9% FLUSH
3.0000 mL | Freq: Once | INTRAVENOUS | Status: AC
  Administered 2019-06-16: 3 mL via INTRAVENOUS

## 2019-06-15 MED ORDER — METOCLOPRAMIDE HCL 5 MG/ML IJ SOLN
10.0000 mg | Freq: Once | INTRAMUSCULAR | Status: AC
  Administered 2019-06-16: 10 mg via INTRAVENOUS
  Filled 2019-06-15: qty 2

## 2019-06-15 MED ORDER — DIPHENHYDRAMINE HCL 50 MG/ML IJ SOLN
25.0000 mg | Freq: Once | INTRAMUSCULAR | Status: AC
  Administered 2019-06-16: 25 mg via INTRAVENOUS
  Filled 2019-06-15: qty 1

## 2019-06-15 MED ORDER — SODIUM CHLORIDE 0.9 % IV BOLUS
1000.0000 mL | Freq: Once | INTRAVENOUS | Status: AC
  Administered 2019-06-16: 1000 mL via INTRAVENOUS

## 2019-06-15 MED ORDER — MORPHINE SULFATE (PF) 4 MG/ML IV SOLN: 4.0000 mg | Freq: Once | INTRAVENOUS | Status: DC

## 2019-06-15 NOTE — ED Provider Notes (Signed)
[facility] DEPT Provider Note   CSN: [id number] Arrival date & time: 06/15/19  2140     History Chief Complaint  Patient presents with   Abdominal Pain   Pelvic Pain    Christine Grant is a 44 y.o. female with a past medical history of diabetes, fibromyalgia, chronic pelvic pain, obesity, anemia presenting to the ED with a chief complaint of abdominal pain, pelvic pain, headache, body aches, nausea and vomiting. Regarding her abdominal pain and pelvic pain, she reports symptoms have been going on since 06/11/2019.  She was seen and evaluated at [facility] ED, CT scan was done which showed prominent appendix.  Remainder work-up was unremarkable.  She was told in her prior CT scans for the past 2 to 3 months that she had a prominent appendix and that they were going to "just watch it."  She reports improvement in her symptoms after ED visit but now symptoms have worsened.  She reports right lower quadrant pain.  She is status post hysterectomy and left oophorectomy for ovarian torsion.  She does have a right ovary.  She denies any vaginal discharge, abnormal vaginal bleeding.  She does endorse dysuria.  She has been taking her home tramadol with only minimal improvement in her symptoms. She reports headache, body aches, nausea and several episodes of nonbloody, nonbilious emesis since earlier today.  No sick contacts with similar symptoms.  Minimal improvement noted with Tylenol.  States that she has had hyperglycemia for the past several weeks and "my doctor is working on fixing it with my meds."  She denies any chest pain, cough, shortness of breath, injuries or falls, neck stiffness,, numbness in arms or legs, vision changes.  HPI     Past Medical History:  Diagnosis Date   Abnormal uterine bleeding    Anxiety    Arthritis    knees   Chronic pelvic pain in female    Depression    Endometriosis of pelvis    Fibromyalgia    History of  ketoacidosis    01-15-2014   PONV (postoperative nausea and vomiting)    Sinus tachycardia    Type 2 diabetes mellitus ([place])    Wears contact lenses     Patient Active Problem List   Diagnosis Date Noted   Anemia 07/01/2015   Sinus tachycardia 05/27/2015   History of migraine headaches 05/27/2015   Benign paroxysmal positional vertigo 02/02/2015   Hypertension associated with diabetes ([place]) 02/02/2015   Primary osteoarthritis of both hips 01/22/2015   Degenerative disc disease, cervical 01/22/2015   Hypertriglyceridemia 12/24/2014   Morbid obesity ([place]) 07/09/2014   Fibromyalgia muscle pain 10/07/2013   Diabetes mellitus type 2 in obese ([place]) 10/07/2013   GAD (generalized anxiety disorder) 10/07/2013   Endometriosis of pelvis 10/07/2013    Past Surgical History:  Procedure Laterality Date   ABDOMINAL HYSTERECTOMY     BREAST REDUCTION SURGERY  age 24   CESAREAN SECTION  2002,  2005,  2008   DILATION AND CURETTAGE OF UTERUS N/A 02/13/2014   Procedure: DILATATION AND CURETTAGE;  Surgeon: Everitt Amber, MD;  Location: [facility];  Service: Gynecology;  Laterality: N/A;   LEFT OOPHORECTOMY  2019   MANDIBLE SURGERY  age 71   Correct overbite   REDUCTION MAMMAPLASTY       OB History    Gravida  4   Para  3   Term  3   Preterm      AB  Living  3     SAB      TAB      Ectopic      Multiple      Live Births              Family History  Adopted: Yes  Problem Relation Age of Onset   Hyperlipidemia Mother    Hypertension Mother    Diabetes Mother    Breast cancer Maternal Aunt    Endometriosis Maternal Aunt     Social History   Tobacco Use   Smoking status: Never Smoker   Smokeless tobacco: Never Used  Substance Use Topics   Alcohol use: No   Drug use: No    Home Medications Prior to Admission medications   Medication Sig Start Date End Date Taking? Authorizing Provider  acetaminophen  (TYLENOL) 500 MG tablet Take 500 mg by mouth every 6 (six) hours as needed.   Yes [provider]  ALPRAZolam (XANAX) 1 MG tablet TAKE 1 TABLET (1 MG TOTAL) BY MOUTH 2 (TWO) TIMES DAILY AS NEEDED. 06/03/19  Yes Emeterio Reeve, DO  cyclobenzaprine (FLEXERIL) 10 MG tablet Take 1 tablet (10 mg total) by mouth 3 (three) times daily as needed for muscle spasms. 12/12/18  Yes Alexander, Natalie, DO  FARXIGA 10 MG TABS tablet TAKE 10 MG BY MOUTH DAILY. 12/25/18  Yes Emeterio Reeve, DO  gabapentin (NEURONTIN) 300 MG capsule Take 3 capsules (900 mg total) by mouth 3 (three) times daily. 05/29/19 08/27/19 Yes Emeterio Reeve, DO  Insulin Detemir (LEVEMIR FLEXTOUCH) 100 UNIT/ML Pen Inject 26 Units into the skin daily. 01/29/19  Yes Emeterio Reeve, DO  Insulin Pen Needle (ADVOCATE INSULIN PEN NEEDLES) 33G X 4 MM MISC 1 each by Does not apply route daily. 10/01/18  Yes Gregor Hams, MD  lisinopril (ZESTRIL) 10 MG tablet Take 1 tablet (10 mg total) by mouth daily. 01/29/19  Yes Emeterio Reeve, DO  metoprolol succinate (TOPROL-XL) 100 MG 24 hr tablet Take 1 tablet (100 mg total) by mouth daily. 04/30/19  Yes Emeterio Reeve, DO  promethazine (PHENERGAN) 25 MG tablet Take 1 tablet (25 mg total) by mouth every 6 (six) hours as needed for nausea or vomiting. 05/20/19  Yes Samuel Bouche, NP  traMADol (ULTRAM) 50 MG tablet Take 1 tablet (50 mg total) by mouth daily as needed. 05/21/19 06/20/19 Yes Alexander, Lanelle Bal, DO  cephALEXin (KEFLEX) 500 MG capsule Take 1 capsule (500 mg total) by mouth 2 (two) times daily for 7 days. 06/16/19 06/23/19  Tahra Hitzeman, PA-C  Dulaglutide (TRULICITY) 9.38 HW/2.9HB SOPN Inject 0.5 mLs into the skin once a week. 05/21/19   Emeterio Reeve, DO  zolpidem (AMBIEN CR) 12.5 MG CR tablet TAKE 1 TABLET (12.5 MG TOTAL) BY MOUTH AT BEDTIME AS NEEDED. 06/03/19   Emeterio Reeve, DO    Allergies    Toradol [ketorolac tromethamine]  Review of Systems   Review of Systems    Constitutional: Negative for appetite change, chills and fever.  HENT: Negative for ear pain, rhinorrhea, sneezing and sore throat.   Eyes: Negative for photophobia and visual disturbance.  Respiratory: Negative for cough, chest tightness, shortness of breath and wheezing.   Cardiovascular: Negative for chest pain and palpitations.  Gastrointestinal: Positive for abdominal pain, nausea and vomiting. Negative for blood in stool, constipation and diarrhea.  Genitourinary: Positive for pelvic pain. Negative for dysuria, hematuria and urgency.  Musculoskeletal: Positive for myalgias.  Skin: Negative for rash.  Neurological: Positive for headaches. Negative for dizziness,  weakness and light-headedness.    Physical Exam Updated Vital Signs BP (!) 160/94    Pulse (!) 119    Temp 99.2 F (37.3 C) (Oral)    Resp (!) 23    Ht 5\' 6"  (1.676 m)    Wt 120.4 kg    LMP  (LMP Unknown)    SpO2 95%    BMI 42.84 kg/m   Physical Exam Vitals and nursing note reviewed.  Constitutional:      General: She is not in acute distress.    Appearance: She is well-developed.  HENT:     Head: Normocephalic and atraumatic.     Nose: Nose normal.  Eyes:     General: No scleral icterus.       Right eye: No discharge.        Left eye: No discharge.     Conjunctiva/sclera: Conjunctivae normal.     Pupils: Pupils are equal, round, and reactive to light.  Cardiovascular:     Rate and Rhythm: Normal rate and regular rhythm.     Heart sounds: Normal heart sounds. No murmur. No friction rub. No gallop.   Pulmonary:     Effort: Pulmonary effort is normal. No respiratory distress.     Breath sounds: Normal breath sounds.  Abdominal:     General: Bowel sounds are normal. There is no distension.     Palpations: Abdomen is soft.     Tenderness: There is abdominal tenderness in the right lower quadrant. There is no guarding.  Musculoskeletal:        General: Normal range of motion.     Cervical back: Normal range of  motion and neck supple.  Skin:    General: Skin is warm and dry.     Findings: No rash.  Neurological:     General: No focal deficit present.     Mental Status: She is alert and oriented to person, place, and time.     Cranial Nerves: No cranial nerve deficit.     Sensory: No sensory deficit.     Motor: No weakness or abnormal muscle tone.     Coordination: Coordination normal.     ED Results / Procedures / Treatments   Labs (all labs ordered are listed, but only abnormal results are displayed) Labs Reviewed  COMPREHENSIVE METABOLIC PANEL - Abnormal; Notable for the following components:      Result Value   Glucose, Bld 222 (*)    All other components within normal limits  CBC - Abnormal; Notable for the following components:   RBC 5.33 (*)    Hemoglobin 15.1 (*)    All other components within normal limits  URINALYSIS, ROUTINE W REFLEX MICROSCOPIC - Abnormal; Notable for the following components:   Color, Urine STRAW (*)    Glucose, UA >=500 (*)    Leukocytes,Ua TRACE (*)    Bacteria, UA RARE (*)    All other components within normal limits  SARS CORONAVIRUS 2 BY RT PCR (HOSPITAL ORDER, [facility])  LIPASE, BLOOD    EKG EKG Interpretation  Date/Time:  Saturday June 15 2019 23:10:49 EDT Ventricular Rate:  119 PR Interval:    QRS Duration: 90 QT Interval:  325 QTC Calculation: 458 R Axis:   86 Text Interpretation: Sinus tachycardia Probable left atrial enlargement Confirmed by Ripley Fraise [phone]) on 06/15/2019 11:31:19 PM   Radiology CT ABDOMEN PELVIS W CONTRAST  Result Date: 06/16/2019 CLINICAL DATA:  Right lower quadrant abdominal pain, nausea  and vomiting. EXAM: CT ABDOMEN AND PELVIS WITH CONTRAST TECHNIQUE: Multidetector CT imaging of the abdomen and pelvis was performed using the standard protocol following bolus administration of intravenous contrast. CONTRAST:  162mL OMNIPAQUE IOHEXOL 300 MG/ML  SOLN COMPARISON:  Ultrasound  10/27/2015, CT 03/05/2015 FINDINGS: Lower chest: Atelectatic changes in the otherwise clear lung bases. Trace pericardial fluid similar to prior and likely within physiologic normal. Normal cardiac size. Included mediastinum is otherwise unremarkable. Hepatobiliary: Ill-defined 1.5 cm hypoattenuating lesion in the posterior segment IV (2/26, 5/75). No progressive enhancement on delayed imaging. Not discernible on prior imaging. Favor benign hepatic cyst however incompletely characterized on this exam. No other focal liver lesions. Smooth liver surface contour. Normal hepatic attenuation. No gallstones, gallbladder wall thickening, or biliary dilatation. Pancreas: Unremarkable. No pancreatic ductal dilatation or surrounding inflammatory changes. Spleen: Normal in size without focal abnormality. Adrenals/Urinary Tract: Normal adrenal glands. Kidneys are normally located with symmetric enhancement and excretion. No suspicious renal lesion, urolithiasis or hydronephrosis. Chronic scarring along the anterior bladder. Urinary bladder is otherwise unremarkable. Stomach/Bowel: Distal esophagus, stomach and duodenal sweep are unremarkable. No small bowel wall thickening or dilatation. No evidence of obstruction. Tip of the appendix is borderline dilated at 8 mm with very little focal inflammation only seen on coronal imaging (5/102). No extraluminal gas or fluid collection is seen. No colonic dilatation or wall thickening. Vascular/Lymphatic: No significant vascular findings are present. No enlarged abdominal or pelvic lymph nodes. Reproductive: Patient is post hysterectomy and left oophorectomy. Retained right ovarian tissue without acute abnormality or worrisome adnexal lesion. Other: Postsurgical changes of the low anterior pelvic wall compatible prior surgical change. No bowel containing hernia. No abdominopelvic free air or fluid. Musculoskeletal: No acute osseous abnormality or suspicious osseous lesion. Stable  Schmorl's node formation at T10. Vertebral body hemangioma at L1. Mild degenerative changes of the spine hips and pelvis. IMPRESSION: 1. Tip of the appendix is borderline dilated at 8 mm with very minimal inflammation. Could reflect early or developing appendicitis. 2. Ill-defined 1.5 cm hypoattenuating lesion in the posterior segment IV (2/26, 5/75). No progressive enhancement on delayed imaging. Favor benign hepatic cyst however incompletely characterized on this exam. Could consider further evaluation with nonemergent liver MRI. 3. Chronic scarring along the anterior bladder and in the soft tissues of the low anterior pelvic wall. 4. Prior hysterectomy and left oophorectomy. Retained right ovarian tissue without acute abnormality or worrisome adnexal lesion. Electronically Signed   By: Lovena Le M.D.   On: 06/16/2019 01:43   US PELVIC COMPLETE W TRANSVAGINAL AND TORSION R/O  Result Date: 06/16/2019 CLINICAL DATA:  Right lower quadrant pain for 1 day, history of abdominal hysterectomy and left oophorectomy EXAM: TRANSABDOMINAL AND TRANSVAGINAL ULTRASOUND OF PELVIS DOPPLER ULTRASOUND OF OVARIES TECHNIQUE: Both transabdominal and transvaginal ultrasound examinations of the pelvis were performed. Transabdominal technique was performed for global imaging of the pelvis including uterus, ovaries, adnexal regions, and pelvic cul-de-sac. It was necessary to proceed with endovaginal exam following the transabdominal exam to visualize the right ovary and adnexa. Color and duplex Doppler ultrasound was utilized to evaluate blood flow to the ovaries. COMPARISON:  CT abdomen and pelvis 06/16/2019, pelvic ultrasound 02/04/2018 FINDINGS: Uterus Surgically absent. Endometrium Surgically absent. Right ovary Measurements: 3.1 x 2.3 x 2.6 cm = volume: 9.6 mL. Normal appearance of the right ovary with small anechoic follicle measuring 1.2 cm in size. No concerning adnexal lesions. There has been interval involution of a  previously seen hemorrhagic cyst. Left ovary Surgically absent. Pulsed Doppler  evaluation of the right ovary demonstrates normal low-resistance arterial and venous waveforms. Other findings No abnormal free fluid. IMPRESSION: Surgical absence of the uterus and left ovary. Normal sonographic appearance and Doppler assessment of the right ovary. Specifically, no evidence of ovarian torsion or concerning adnexal lesion with involution of the previously seen right adnexal hemorrhagic cyst from prior sonography. Electronically Signed   By: Lovena Le M.D.   On: 06/16/2019 03:47    Procedures Procedures (including critical care time)  Medications Ordered in ED Medications  sodium chloride (PF) 0.9 % injection (  Not Given 06/16/19 0202)  sodium chloride flush (NS) 0.9 % injection 3 mL (3 mLs Intravenous Given 06/16/19 0408)  sodium chloride 0.9 % bolus 1,000 mL (0 mLs Intravenous Stopped 06/16/19 0108)  metoCLOPramide (REGLAN) injection 10 mg (10 mg Intravenous Given 06/16/19 0011)  diphenhydrAMINE (BENADRYL) injection 25 mg (25 mg Intravenous Given 06/16/19 0009)  iohexol (OMNIPAQUE) 300 MG/ML solution 100 mL (100 mLs Intravenous Contrast Given 06/16/19 0110)  morphine 4 MG/ML injection 4 mg (4 mg Intravenous Given 06/16/19 0149)  ondansetron (ZOFRAN) injection 4 mg (4 mg Intravenous Given 06/16/19 0406)  morphine 4 MG/ML injection 4 mg (4 mg Intravenous Given 06/16/19 0405)    ED Course  I have reviewed the triage vital signs and the nursing notes.  Pertinent labs & imaging results that were available during my care of the patient were reviewed by me and considered in my medical decision making (see chart for details).    MDM Rules/Calculators/A&P                      44 year old female with a past medical history of diabetes, fibromyalgia, chronic pelvic pain, obesity presenting to the ED with a chief complaint of abdominal pain, headache, body aches, nausea and vomiting.  Reports abdominal pain and pelvic  pain in the right lower quadrant since 06/11/2019.  [facility] scan done at [facility] ED when pain started shows dilated appendix.  She still has a right ovary intact but does not have a uterus or left ovary.  On exam there are tender palpation of the right lower quadrant without rebound or guarding.  She also complains of headache, body aches, vomiting.  Denies chest pain.  Lungs are clear on my exam.  She is tachycardic here although she states that this is chronic for her, chart review shows history of the same at recent office visits.  Her tachycardia has improved with pain control and fluids.  EKG shows sinus tachycardia.  CT of abdomen pelvis shows dilated appendix.  Pelvic ultrasound shows no evidence of torsion.  No leukocytosis noted on lab work.  Urinalysis with some trace leukocytes, rare bacteria.  CBC is unremarkable.  Spoke to Dr. Redmond Pulling of general surgery who reviewed patient's chart and CT scan.  Feel that this is low suspicion for acute appendicitis as her CT findings have been essentially unchanged for the past 5 days.  There may be some element of chronic pelvic and abdominal pain based on chart review.  Patient symptoms controlled here. There are no headache characteristics that are lateralizing or concerning for increased ICP, infectious or vascular cause of her symptoms.  Suspect that her symptoms are viral.  Will treat for UTI as patient is symptomatic.  We will have her continue her home antiemetics and pain medication and follow-up with her PCP.  Patient is agreeable to the plan.  All imaging, if done today, including plain films, CT scans, and  ultrasounds, independently reviewed by me, and interpretations confirmed via formal radiology reads.  Patient is hemodynamically stable, in NAD, and able to ambulate in the ED. Evaluation does not show pathology that would require ongoing emergent intervention or inpatient treatment. I explained the diagnosis to the patient. Pain has been managed and has  no complaints prior to discharge. Patient is comfortable with above plan and is stable for discharge at this time. All questions were answered prior to disposition. Strict return precautions for returning to the ED were discussed. Encouraged follow up with PCP.   An After Visit Summary was printed and given to the patient.   Portions of this note were generated with [employer]. Dictation errors may occur despite best attempts at proofreading.  Final Clinical Impression(s) / ED Diagnoses Final diagnoses:  Lower urinary tract infectious disease    Rx / DC Orders ED Discharge Orders         Ordered    cephALEXin (KEFLEX) 500 MG capsule  2 times daily     06/16/19 0512           Delia Heady, PA-C 06/16/19 1410    Ripley Fraise, MD 06/16/19 [phone]

## 2019-06-15 NOTE — ED Triage Notes (Signed)
Patient presents with body pain, abdominal pain and pelvic pain. Patient endorses N/V, but was able to hold down dinner. Patient states her CBGs have also been high (357 is the highest).

## 2019-06-16 ENCOUNTER — Emergency Department (HOSPITAL_COMMUNITY): Payer: BC Managed Care – PPO

## 2019-06-16 ENCOUNTER — Encounter (HOSPITAL_COMMUNITY): Payer: Self-pay

## 2019-06-16 DIAGNOSIS — R1031 Right lower quadrant pain: Secondary | ICD-10-CM | POA: Diagnosis not present

## 2019-06-16 DIAGNOSIS — R112 Nausea with vomiting, unspecified: Secondary | ICD-10-CM | POA: Diagnosis not present

## 2019-06-16 LAB — CBC
HCT: 44.8 % (ref 36.0–46.0)
Hemoglobin: 15.1 g/dL — ABNORMAL HIGH (ref 12.0–15.0)
MCH: 28.3 pg (ref 26.0–34.0)
MCHC: 33.7 g/dL (ref 30.0–36.0)
MCV: 84.1 fL (ref 80.0–100.0)
Platelets: 242 10*3/uL (ref 150–400)
RBC: 5.33 MIL/uL — ABNORMAL HIGH (ref 3.87–5.11)
RDW: 12.5 % (ref 11.5–15.5)
WBC: 7 10*3/uL (ref 4.0–10.5)
nRBC: 0 % (ref 0.0–0.2)

## 2019-06-16 LAB — COMPREHENSIVE METABOLIC PANEL
ALT: 36 U/L (ref 0–44)
AST: 20 U/L (ref 15–41)
Albumin: 4 g/dL (ref 3.5–5.0)
Alkaline Phosphatase: 71 U/L (ref 38–126)
Anion gap: 11 (ref 5–15)
BUN: 11 mg/dL (ref 6–20)
CO2: 24 mmol/L (ref 22–32)
Calcium: 9.3 mg/dL (ref 8.9–10.3)
Chloride: 101 mmol/L (ref 98–111)
Creatinine, Ser: 0.55 mg/dL (ref 0.44–1.00)
GFR calc Af Amer: >60 mL/min (ref >60)
GFR calc non Af Amer: >60 mL/min (ref >60)
Glucose, Bld: 222 mg/dL — ABNORMAL HIGH (ref 70–99)
Potassium: 4 mmol/L (ref 3.5–5.1)
Sodium: 136 mmol/L (ref 135–145)
Total Bilirubin: 0.8 mg/dL (ref 0.3–1.2)
Total Protein: 7 g/dL (ref 6.5–8.1)

## 2019-06-16 LAB — SARS CORONAVIRUS 2 BY RT PCR (HOSPITAL ORDER, PERFORMED IN ~~LOC~~ HOSPITAL LAB): SARS Coronavirus 2: NEGATIVE

## 2019-06-16 LAB — LIPASE, BLOOD: Lipase: 20 U/L (ref 11–51)

## 2019-06-16 MED ORDER — MORPHINE SULFATE (PF) 4 MG/ML IV SOLN
4.0000 mg | Freq: Once | INTRAVENOUS | Status: AC
  Administered 2019-06-16: 4 mg via INTRAVENOUS
  Filled 2019-06-16: qty 1

## 2019-06-16 MED ORDER — ONDANSETRON HCL 4 MG/2ML IJ SOLN
4.0000 mg | Freq: Once | INTRAMUSCULAR | Status: AC
  Administered 2019-06-16: 4 mg via INTRAVENOUS
  Filled 2019-06-16: qty 2

## 2019-06-16 MED ORDER — SODIUM CHLORIDE (PF) 0.9 % IJ SOLN
INTRAMUSCULAR | Status: AC
  Filled 2019-06-16: qty 50

## 2019-06-16 MED ORDER — CEPHALEXIN 500 MG PO CAPS: 500.0000 mg | ORAL_CAPSULE | Freq: Two times a day (BID) | ORAL | 0 refills | Status: AC

## 2019-06-16 MED ORDER — IOHEXOL 300 MG/ML  SOLN
100.0000 mL | Freq: Once | INTRAMUSCULAR | Status: AC | PRN
  Administered 2019-06-16: 100 mL via INTRAVENOUS

## 2019-06-16 NOTE — Discharge Instructions (Signed)
Take the antibiotics as directed. Follow-up with your primary care provider. Return to the ER for worsening pain, increased or uncontrollable vomiting, chest pain, shortness of breath.

## 2019-06-17 ENCOUNTER — Encounter: Payer: Self-pay | Admitting: Osteopathic Medicine

## 2019-06-18 ENCOUNTER — Encounter: Payer: Self-pay | Admitting: Osteopathic Medicine

## 2019-06-18 ENCOUNTER — Encounter (INDEPENDENT_AMBULATORY_CARE_PROVIDER_SITE_OTHER): Payer: BC Managed Care – PPO | Admitting: Osteopathic Medicine

## 2019-06-18 ENCOUNTER — Ambulatory Visit: Payer: BC Managed Care – PPO | Admitting: Osteopathic Medicine

## 2019-06-18 ENCOUNTER — Other Ambulatory Visit: Payer: Self-pay

## 2019-06-18 VITALS — BP 142/92 | HR 130 | Temp 97.3°F | Wt 279.1 lb

## 2019-06-18 DIAGNOSIS — K760 Fatty (change of) liver, not elsewhere classified: Secondary | ICD-10-CM

## 2019-06-18 DIAGNOSIS — E1169 Type 2 diabetes mellitus with other specified complication: Secondary | ICD-10-CM

## 2019-06-18 DIAGNOSIS — K7689 Other specified diseases of liver: Secondary | ICD-10-CM

## 2019-06-18 DIAGNOSIS — K37 Unspecified appendicitis: Secondary | ICD-10-CM | POA: Diagnosis not present

## 2019-06-18 DIAGNOSIS — R1031 Right lower quadrant pain: Secondary | ICD-10-CM

## 2019-06-18 DIAGNOSIS — F329 Major depressive disorder, single episode, unspecified: Secondary | ICD-10-CM | POA: Diagnosis not present

## 2019-06-18 DIAGNOSIS — E669 Obesity, unspecified: Secondary | ICD-10-CM

## 2019-06-18 DIAGNOSIS — M791 Myalgia, unspecified site: Secondary | ICD-10-CM | POA: Diagnosis not present

## 2019-06-18 DIAGNOSIS — L659 Nonscarring hair loss, unspecified: Secondary | ICD-10-CM

## 2019-06-18 DIAGNOSIS — R935 Abnormal findings on diagnostic imaging of other abdominal regions, including retroperitoneum: Secondary | ICD-10-CM | POA: Diagnosis not present

## 2019-06-18 DIAGNOSIS — E1159 Type 2 diabetes mellitus with other circulatory complications: Secondary | ICD-10-CM

## 2019-06-18 DIAGNOSIS — G8929 Other chronic pain: Secondary | ICD-10-CM

## 2019-06-18 DIAGNOSIS — F339 Major depressive disorder, recurrent, unspecified: Secondary | ICD-10-CM

## 2019-06-18 DIAGNOSIS — I152 Hypertension secondary to endocrine disorders: Secondary | ICD-10-CM

## 2019-06-18 DIAGNOSIS — I10 Essential (primary) hypertension: Secondary | ICD-10-CM

## 2019-06-18 DIAGNOSIS — R202 Paresthesia of skin: Secondary | ICD-10-CM

## 2019-06-18 MED ORDER — TRAMADOL HCL 50 MG PO TABS: 50.0000 mg | ORAL_TABLET | Freq: Every day | ORAL | 0 refills | Status: DC | PRN

## 2019-06-18 NOTE — Progress Notes (Signed)
Christine Grant is a 44 y.o. female who presents to  [facility]    05/21/19, seeking care for the following: . N/V, f/u dysuria (taking abx for UTI), saw NP on virtual visit yesterday. Skipped Trulicity d/t NV didn't want to make this worse. Joy suspected viral GI issue, Rx phenergan for symptomatic relief, sent macrobid and azo for presumed UTI given poor Glc control.  . Last visit A1C 11+ 04/2018. Not c/w insulin administration (Levemir 30 units daily) but pt states doing better taking meds in PM . Today feels improved but not resolved. Concerned about traveling a lot for work and feels would be advantageous to work from home. Concerned about mental health.  . --> started Prozac, reduced Trulicity d/t N/V concern, added PPI, continue Phenergan prn, letter to work from home was written, FMLA forms filled out  Today . Patient has tried to get her employer to let her work from home but they have been resistant to this.  She has stopped going to work altogether at this point.  Concern for persistent abdominal pain, nausea, uncontrolled diabetes, mental health concerns. . Reviewed records, see details below.  Some concern for appendix inflammation/low-grade appendicitis, patient is also concerned about abnormalities found on liver imaging and back imaging, concern for chronic back pain getting worse.   . She has some question over whether diagnosis of fibromyalgia is accurate versus if there might be something else going on.  She was seen by rheumatology about 8 years ago, she cannot recall exact details of this encounter and I do not have records available in terms of labs.  Would assume that previous medical professionals have ruled out rheumatologic/autoimmune or other cause for chronic widespread pain.  Patient would like to repeat rheumatologic work-up and see neurosurgery regarding spine findings/back pain.  Previously seen by Dr.  Christella Noa      Records reviewed ER visits  06/15/2019 -visit for abdominal pain  Ultrasound, surgical absence of uterus and left ovary, right ovary appeared normal no torsion or concerning adnexal lesion.  See below for previous ER evaluation for right ovarian cyst  CT abdomen/pelvis showed likely benign hepatic cyst but incompletely characterized, otherwise normal hepatobiliary.,  Consider MRI normal pancreas.  Normal spleen.  Normal adrenal/kidneys.  Chronic scarring along the anterior bladder but otherwise unremarkable.  Tip of the appendix is borderline dilated with very little focal inflammation only seen on coronal imaging concerning for early/developing appendicitis.  06/11/2019 -visit for abdominal pain  CT abdomen/pelvis showed hepatomegaly with fatty liver, otherwise normal.  Appendix mildly prominent at 0.9 cm, no soft tissue stranding.  White blood cell count normal at 7.1.  Glucose 263.  05/10/2019 -visit for chest pain/pressure that had radiated up from right lower quadrant.   Dx right ovarian cyst based on CT scan, evidence of chronic abdominal adhesions, no abnormalities in appendix.   BP Readings from Last 3 Encounters:  06/16/19 (!) 141/92  06/13/19 (!) 151/90  05/21/19 (!) 137/92         ASSESSMENT & PLAN with other pertinent history/findings:  The primary encounter diagnosis was Right lower quadrant abdominal pain. Diagnoses of Abnormal CT of the abdomen, Liver cyst, Fatty liver, Diabetes mellitus type 2 in obese ([place]), Depression, recurrent ([place]), Hypertension associated with diabetes ([place]), Hair loss, and Paresthesia were also pertinent to this visit.  Concern for possible evolving appendicitis, patient will be a complicated surgical candidate given likelihood of adhesions/previous prolonged/problematic surgeries.  We will  go ahead and refer to general surgery.  Patient/husband are requesting someone with capability for robotic surgery, I advised them that  whenever surgeon the see would be best equipped to answer their questions and decide what course of action is best with a laparoscopic, open, robotic, or other evaluation/treatment versus monitoring.     Orders Placed This Encounter  Procedures  . TSH  . Vitamin B12   Rheumatologic panel: ANA, RF, ESR, CRP  . Ambulatory referral to General Surgery    Referral Priority:   Urgent    Referral Type:   Surgical    Referral Reason:   Specialty Services Required    Requested Specialty:   General Surgery    Number of Visits Requested:   1     There are no Patient Instructions on file for this visit.  Meds ordered this encounter  Medications  . traMADol (ULTRAM) 50 MG tablet    Sig: Take 1 tablet (50 mg total) by mouth daily as needed.    Dispense:  30 tablet    Refill:  0    Not to exceed 4 additional fills before 08/20/2019       Follow-up instructions: Return for RECHECK PENDING RESULTS / IF WORSE OR CHANGE.                                         LMP  (LMP Unknown)   No outpatient medications have been marked as taking for the 06/18/19 encounter (Appointment) with Emeterio Reeve, DO.    Results for orders placed or performed during the hospital encounter of 06/15/19 (from the past 72 hour(s))  Urinalysis, Routine w reflex microscopic     Status: Abnormal   Collection Time: 06/15/19 10:26 PM  Result Value Ref Range   Color, Urine STRAW (A) YELLOW   APPearance CLEAR CLEAR   Specific Gravity, Urine 1.025 1.005 - 1.030   pH 5.0 5.0 - 8.0   Glucose, UA >=500 (A) NEGATIVE mg/dL   Hgb urine dipstick NEGATIVE NEGATIVE   Bilirubin Urine NEGATIVE NEGATIVE   Ketones, ur NEGATIVE NEGATIVE mg/dL   Protein, ur NEGATIVE NEGATIVE mg/dL   Nitrite NEGATIVE NEGATIVE   Leukocytes,Ua TRACE (A) NEGATIVE   RBC / HPF 0-5 0 - 5 RBC/hpf   WBC, UA 11-20 0 - 5 WBC/hpf   Bacteria, UA RARE (A) NONE SEEN   Squamous Epithelial / LPF 6-10 0 - 5     Comment: Performed at [facility], [place] [address] [place], [place] [address]  Lipase, blood     Status: None   Collection Time: 06/15/19 11:46 PM  Result Value Ref Range   Lipase 20 11 - 51 U/L    Comment: Performed at [facility], [place] [address], [place], [place] [address]  Comprehensive metabolic panel     Status: Abnormal   Collection Time: 06/15/19 11:46 PM  Result Value Ref Range   Sodium 136 135 - 145 mmol/L   Potassium 4.0 3.5 - 5.1 mmol/L   Chloride 101 98 - 111 mmol/L   CO2 24 22 - 32 mmol/L   Glucose, Bld 222 (H) 70 - 99 mg/dL    Comment: Glucose reference range applies only to samples taken after fasting for at least 8 hours.   BUN 11 6 - 20 mg/dL   Creatinine, Ser 0.55 0.44 - 1.00 mg/dL   Calcium 9.3  8.9 - 10.3 mg/dL   Total Protein 7.0 6.5 - 8.1 g/dL   Albumin 4.0 3.5 - 5.0 g/dL   AST 20 15 - 41 U/L   ALT 36 0 - 44 U/L   Alkaline Phosphatase 71 38 - 126 U/L   Total Bilirubin 0.8 0.3 - 1.2 mg/dL   GFR calc non Af Amer >60 >60 mL/min   GFR calc Af Amer >60 >60 mL/min   Anion gap 11 5 - 15    Comment: Performed at [facility], [place] [address], [place], [place] [address]  CBC     Status: Abnormal   Collection Time: 06/15/19 11:46 PM  Result Value Ref Range   WBC 7.0 4.0 - 10.5 K/uL   RBC 5.33 (H) 3.87 - 5.11 MIL/uL   Hemoglobin 15.1 (H) 12.0 - 15.0 g/dL   HCT 44.8 36.0 - 46.0 %   MCV 84.1 80.0 - 100.0 fL   MCH 28.3 26.0 - 34.0 pg   MCHC 33.7 30.0 - 36.0 g/dL   RDW 12.5 11.5 - 15.5 %   Platelets 242 150 - 400 K/uL   nRBC 0.0 0.0 - 0.2 %    Comment: Performed at [facility], [place] [address] [place], [place] [address]  SARS Coronavirus 2 by RT PCR (hospital order, performed in [facility] hospital lab) Nasopharyngeal Nasopharyngeal Swab     Status: None   Collection Time: 06/16/19  4:10 AM   Specimen: Nasopharyngeal Swab  Result Value Ref Range   SARS Coronavirus 2 NEGATIVE NEGATIVE     Comment: (NOTE) SARS-CoV-2 target nucleic acids are NOT DETECTED. The SARS-CoV-2 RNA is generally detectable in upper and lower respiratory specimens during the acute phase of infection. The lowest concentration of SARS-CoV-2 viral copies this assay can detect is 250 copies / mL. A negative result does not preclude SARS-CoV-2 infection and should not be used as the sole basis for treatment or other patient management decisions.  A negative result may occur with improper specimen collection / handling, submission of specimen other than nasopharyngeal swab, presence of viral mutation(s) within the areas targeted by this assay, and inadequate number of viral copies (<250 copies / mL). A negative result must be combined with clinical observations, patient history, and epidemiological information. Fact Sheet for Patients:   StrictlyIdeas.no Fact Sheet for Healthcare Providers: BankingDealers.co.za This test is not yet approved or cleared  by the [place] FDA and has been authorized for detection and/or diagnosis of SARS-CoV-2 by FDA under an Emergency Use Authorization (EUA).  This EUA will remain in effect (meaning this test can be used) for the duration of the COVID-19 declaration under Section 564(b)(1) of the Act, 21 U.S.C. section 360bbb-3(b)(1), unless the authorization is terminated or revoked sooner. Performed at [facility], [place] [address] [place], [place] [address]     CT ABDOMEN PELVIS W CONTRAST  Result Date: 06/16/2019 CLINICAL DATA:  Right lower quadrant abdominal pain, nausea and vomiting. EXAM: CT ABDOMEN AND PELVIS WITH CONTRAST TECHNIQUE: Multidetector CT imaging of the abdomen and pelvis was performed using the standard protocol following bolus administration of intravenous contrast. CONTRAST:  160m OMNIPAQUE IOHEXOL 300 MG/ML  SOLN COMPARISON:  Ultrasound 10/27/2015, CT 03/05/2015 FINDINGS: Lower  chest: Atelectatic changes in the otherwise clear lung bases. Trace pericardial fluid similar to prior and likely within physiologic normal. Normal cardiac size. Included mediastinum is otherwise unremarkable. Hepatobiliary: Ill-defined 1.5 cm hypoattenuating lesion in the posterior segment IV (2/26, 5/75). No  progressive enhancement on delayed imaging. Not discernible on prior imaging. Favor benign hepatic cyst however incompletely characterized on this exam. No other focal liver lesions. Smooth liver surface contour. Normal hepatic attenuation. No gallstones, gallbladder wall thickening, or biliary dilatation. Pancreas: Unremarkable. No pancreatic ductal dilatation or surrounding inflammatory changes. Spleen: Normal in size without focal abnormality. Adrenals/Urinary Tract: Normal adrenal glands. Kidneys are normally located with symmetric enhancement and excretion. No suspicious renal lesion, urolithiasis or hydronephrosis. Chronic scarring along the anterior bladder. Urinary bladder is otherwise unremarkable. Stomach/Bowel: Distal esophagus, stomach and duodenal sweep are unremarkable. No small bowel wall thickening or dilatation. No evidence of obstruction. Tip of the appendix is borderline dilated at 8 mm with very little focal inflammation only seen on coronal imaging (5/102). No extraluminal gas or fluid collection is seen. No colonic dilatation or wall thickening. Vascular/Lymphatic: No significant vascular findings are present. No enlarged abdominal or pelvic lymph nodes. Reproductive: Patient is post hysterectomy and left oophorectomy. Retained right ovarian tissue without acute abnormality or worrisome adnexal lesion. Other: Postsurgical changes of the low anterior pelvic wall compatible prior surgical change. No bowel containing hernia. No abdominopelvic free air or fluid. Musculoskeletal: No acute osseous abnormality or suspicious osseous lesion. Stable Schmorl's node formation at T10. Vertebral body  hemangioma at L1. Mild degenerative changes of the spine hips and pelvis. IMPRESSION: 1. Tip of the appendix is borderline dilated at 8 mm with very minimal inflammation. Could reflect early or developing appendicitis. 2. Ill-defined 1.5 cm hypoattenuating lesion in the posterior segment IV (2/26, 5/75). No progressive enhancement on delayed imaging. Favor benign hepatic cyst however incompletely characterized on this exam. Could consider further evaluation with nonemergent liver MRI. 3. Chronic scarring along the anterior bladder and in the soft tissues of the low anterior pelvic wall. 4. Prior hysterectomy and left oophorectomy. Retained right ovarian tissue without acute abnormality or worrisome adnexal lesion. Electronically Signed   By: Lovena Le M.D.   On: 06/16/2019 01:43   US PELVIC COMPLETE W TRANSVAGINAL AND TORSION R/O  Result Date: 06/16/2019 CLINICAL DATA:  Right lower quadrant pain for 1 day, history of abdominal hysterectomy and left oophorectomy EXAM: TRANSABDOMINAL AND TRANSVAGINAL ULTRASOUND OF PELVIS DOPPLER ULTRASOUND OF OVARIES TECHNIQUE: Both transabdominal and transvaginal ultrasound examinations of the pelvis were performed. Transabdominal technique was performed for global imaging of the pelvis including uterus, ovaries, adnexal regions, and pelvic cul-de-sac. It was necessary to proceed with endovaginal exam following the transabdominal exam to visualize the right ovary and adnexa. Color and duplex Doppler ultrasound was utilized to evaluate blood flow to the ovaries. COMPARISON:  CT abdomen and pelvis 06/16/2019, pelvic ultrasound 02/04/2018 FINDINGS: Uterus Surgically absent. Endometrium Surgically absent. Right ovary Measurements: 3.1 x 2.3 x 2.6 cm = volume: 9.6 mL. Normal appearance of the right ovary with small anechoic follicle measuring 1.2 cm in size. No concerning adnexal lesions. There has been interval involution of a previously seen hemorrhagic cyst. Left ovary Surgically  absent. Pulsed Doppler evaluation of the right ovary demonstrates normal low-resistance arterial and venous waveforms. Other findings No abnormal free fluid. IMPRESSION: Surgical absence of the uterus and left ovary. Normal sonographic appearance and Doppler assessment of the right ovary. Specifically, no evidence of ovarian torsion or concerning adnexal lesion with involution of the previously seen right adnexal hemorrhagic cyst from prior sonography. Electronically Signed   By: Lovena Le M.D.   On: 06/16/2019 03:47    Depression screen [facility] 2/9 09/11/2018 09/03/2018 06/25/2018  Decreased Interest 1 1 0  Down, Depressed, Hopeless _0 PHQ - 2 Score _1 Altered sleeping 0 0 0  Tired, decreased energy _2 Change in appetite 0 1 1  Feeling bad or failure about yourself  1 0 1  Trouble concentrating 0 0 0  Moving slowly or fidgety/restless 0 0 1  Suicidal thoughts 0 0 0  PHQ-9 Score _3 Difficult doing work/chores Somewhat difficult Somewhat difficult Somewhat difficult    GAD 7 : Generalized Anxiety Score 09/11/2018 09/03/2018 06/25/2018  Nervous, Anxious, on Edge _4 Control/stop worrying 1 0 2  Worry too much - different things _5 Trouble relaxing 0 0 1  Restless 0 0 0  Easily annoyed or irritable _6 Afraid - awful might happen 0 0 0  Total GAD 7 Score _7 Anxiety Difficulty Somewhat difficult Somewhat difficult Somewhat difficult      All questions at time of visit were answered - patient instructed to contact office with any additional concerns or updates.  ER/RTC precautions were reviewed with the patient.  Please note: voice recognition software was used to produce this document, and typos may escape review. Please contact Dr. Sheppard Coil for any needed clarifications.   Total time spent: 40 minutes

## 2019-06-20 NOTE — Telephone Encounter (Signed)
Orders added on, patient may need to come back for additional blood draw.  Please call her to let her know.  5 minutes spent, billed appropriately

## 2019-06-24 LAB — CK: Total CK: 20 U/L — ABNORMAL LOW (ref 29–143)

## 2019-06-24 LAB — ANA: Anti Nuclear Antibody (ANA): NEGATIVE

## 2019-06-24 LAB — SEDIMENTATION RATE: Sed Rate: 17 mm/h (ref 0–20)

## 2019-06-24 LAB — HIGH SENSITIVITY CRP: hs-CRP: >10 mg/L — ABNORMAL HIGH

## 2019-06-24 LAB — RHEUMATOID FACTOR: Rheumatoid fact SerPl-aCnc: <14 IU/mL (ref <14)

## 2019-06-25 ENCOUNTER — Telehealth: Payer: Self-pay

## 2019-06-25 NOTE — Telephone Encounter (Signed)
Patient notified paperwork is available for pick up. Patient voiced her understanding

## 2019-06-26 ENCOUNTER — Telehealth: Payer: Self-pay | Admitting: *Deleted

## 2019-06-26 NOTE — Telephone Encounter (Signed)
Called patient to schedule 2 month F/U appointment with Dr. Rosana Hoes, patient states that she has decided to go with another GYN.

## 2019-06-28 DIAGNOSIS — Z6841 Body Mass Index (BMI) 40.0 and over, adult: Secondary | ICD-10-CM | POA: Diagnosis not present

## 2019-06-28 DIAGNOSIS — E785 Hyperlipidemia, unspecified: Secondary | ICD-10-CM | POA: Diagnosis not present

## 2019-06-28 DIAGNOSIS — E119 Type 2 diabetes mellitus without complications: Secondary | ICD-10-CM | POA: Diagnosis not present

## 2019-06-28 DIAGNOSIS — R000 Tachycardia, unspecified: Secondary | ICD-10-CM | POA: Diagnosis not present

## 2019-06-28 DIAGNOSIS — M5136 Other intervertebral disc degeneration, lumbar region: Secondary | ICD-10-CM | POA: Diagnosis not present

## 2019-06-28 DIAGNOSIS — K3580 Unspecified acute appendicitis: Secondary | ICD-10-CM | POA: Diagnosis not present

## 2019-06-28 DIAGNOSIS — Z79899 Other long term (current) drug therapy: Secondary | ICD-10-CM | POA: Diagnosis not present

## 2019-06-28 DIAGNOSIS — K388 Other specified diseases of appendix: Secondary | ICD-10-CM | POA: Diagnosis not present

## 2019-06-28 DIAGNOSIS — K37 Unspecified appendicitis: Secondary | ICD-10-CM | POA: Diagnosis not present

## 2019-06-28 DIAGNOSIS — I10 Essential (primary) hypertension: Secondary | ICD-10-CM | POA: Diagnosis not present

## 2019-06-28 DIAGNOSIS — Z5331 Laparoscopic surgical procedure converted to open procedure: Secondary | ICD-10-CM | POA: Diagnosis not present

## 2019-06-28 DIAGNOSIS — M797 Fibromyalgia: Secondary | ICD-10-CM | POA: Diagnosis not present

## 2019-06-28 DIAGNOSIS — Z794 Long term (current) use of insulin: Secondary | ICD-10-CM | POA: Diagnosis not present

## 2019-06-28 DIAGNOSIS — Z9049 Acquired absence of other specified parts of digestive tract: Secondary | ICD-10-CM

## 2019-06-28 DIAGNOSIS — Z888 Allergy status to other drugs, medicaments and biological substances status: Secondary | ICD-10-CM | POA: Diagnosis not present

## 2019-06-28 DIAGNOSIS — F419 Anxiety disorder, unspecified: Secondary | ICD-10-CM | POA: Diagnosis not present

## 2019-06-28 DIAGNOSIS — F329 Major depressive disorder, single episode, unspecified: Secondary | ICD-10-CM | POA: Diagnosis not present

## 2019-06-28 DIAGNOSIS — M1990 Unspecified osteoarthritis, unspecified site: Secondary | ICD-10-CM | POA: Diagnosis not present

## 2019-06-28 HISTORY — DX: Acquired absence of other specified parts of digestive tract: Z90.49

## 2019-07-03 ENCOUNTER — Other Ambulatory Visit: Payer: Self-pay | Admitting: Osteopathic Medicine

## 2019-07-03 NOTE — Telephone Encounter (Signed)
[facility] requesting med refills for tramadol.

## 2019-07-08 ENCOUNTER — Encounter: Payer: Self-pay | Admitting: Osteopathic Medicine

## 2019-07-08 DIAGNOSIS — Z4801 Encounter for change or removal of surgical wound dressing: Secondary | ICD-10-CM | POA: Diagnosis not present

## 2019-07-08 DIAGNOSIS — M797 Fibromyalgia: Secondary | ICD-10-CM | POA: Diagnosis not present

## 2019-07-08 DIAGNOSIS — L7622 Postprocedural hemorrhage and hematoma of skin and subcutaneous tissue following other procedure: Secondary | ICD-10-CM | POA: Diagnosis not present

## 2019-07-08 DIAGNOSIS — I10 Essential (primary) hypertension: Secondary | ICD-10-CM | POA: Diagnosis not present

## 2019-07-08 DIAGNOSIS — Z794 Long term (current) use of insulin: Secondary | ICD-10-CM | POA: Diagnosis not present

## 2019-07-08 DIAGNOSIS — Z79891 Long term (current) use of opiate analgesic: Secondary | ICD-10-CM | POA: Diagnosis not present

## 2019-07-08 DIAGNOSIS — E1165 Type 2 diabetes mellitus with hyperglycemia: Secondary | ICD-10-CM | POA: Diagnosis not present

## 2019-07-08 DIAGNOSIS — R000 Tachycardia, unspecified: Secondary | ICD-10-CM | POA: Diagnosis not present

## 2019-07-08 DIAGNOSIS — Z888 Allergy status to other drugs, medicaments and biological substances status: Secondary | ICD-10-CM | POA: Diagnosis not present

## 2019-07-08 DIAGNOSIS — Z79899 Other long term (current) drug therapy: Secondary | ICD-10-CM | POA: Diagnosis not present

## 2019-07-08 DIAGNOSIS — Z9049 Acquired absence of other specified parts of digestive tract: Secondary | ICD-10-CM | POA: Diagnosis not present

## 2019-07-10 ENCOUNTER — Encounter: Payer: Self-pay | Admitting: Osteopathic Medicine

## 2019-07-16 ENCOUNTER — Other Ambulatory Visit: Payer: Self-pay | Admitting: Osteopathic Medicine

## 2019-07-16 DIAGNOSIS — E669 Obesity, unspecified: Secondary | ICD-10-CM | POA: Diagnosis not present

## 2019-07-16 DIAGNOSIS — F419 Anxiety disorder, unspecified: Secondary | ICD-10-CM | POA: Diagnosis not present

## 2019-07-16 DIAGNOSIS — R1031 Right lower quadrant pain: Secondary | ICD-10-CM | POA: Diagnosis not present

## 2019-07-16 DIAGNOSIS — R509 Fever, unspecified: Secondary | ICD-10-CM | POA: Diagnosis not present

## 2019-07-16 DIAGNOSIS — M797 Fibromyalgia: Secondary | ICD-10-CM | POA: Diagnosis not present

## 2019-07-16 DIAGNOSIS — I10 Essential (primary) hypertension: Secondary | ICD-10-CM | POA: Diagnosis not present

## 2019-07-16 DIAGNOSIS — T8131XA Disruption of external operation (surgical) wound, not elsewhere classified, initial encounter: Secondary | ICD-10-CM | POA: Diagnosis not present

## 2019-07-16 DIAGNOSIS — Z886 Allergy status to analgesic agent status: Secondary | ICD-10-CM | POA: Diagnosis not present

## 2019-07-16 DIAGNOSIS — M791 Myalgia, unspecified site: Secondary | ICD-10-CM | POA: Diagnosis not present

## 2019-07-16 DIAGNOSIS — T8130XA Disruption of wound, unspecified, initial encounter: Secondary | ICD-10-CM | POA: Diagnosis not present

## 2019-07-16 DIAGNOSIS — G8929 Other chronic pain: Secondary | ICD-10-CM

## 2019-07-16 DIAGNOSIS — F329 Major depressive disorder, single episode, unspecified: Secondary | ICD-10-CM | POA: Diagnosis not present

## 2019-07-16 DIAGNOSIS — Z79899 Other long term (current) drug therapy: Secondary | ICD-10-CM | POA: Diagnosis not present

## 2019-07-16 DIAGNOSIS — E1165 Type 2 diabetes mellitus with hyperglycemia: Secondary | ICD-10-CM | POA: Diagnosis not present

## 2019-07-16 DIAGNOSIS — Z794 Long term (current) use of insulin: Secondary | ICD-10-CM | POA: Diagnosis not present

## 2019-07-16 NOTE — Telephone Encounter (Signed)
Last refill- 06/18/2019 Last Ov- 06/18/2019

## 2019-07-31 ENCOUNTER — Encounter: Payer: Self-pay | Admitting: Osteopathic Medicine

## 2019-08-01 ENCOUNTER — Encounter: Payer: Self-pay | Admitting: Nurse Practitioner

## 2019-08-01 ENCOUNTER — Telehealth (INDEPENDENT_AMBULATORY_CARE_PROVIDER_SITE_OTHER): Payer: BC Managed Care – PPO | Admitting: Nurse Practitioner

## 2019-08-01 DIAGNOSIS — Z7689 Persons encountering health services in other specified circumstances: Secondary | ICD-10-CM | POA: Diagnosis not present

## 2019-08-01 NOTE — Patient Instructions (Signed)
I have provided a note permitting you to return to work remotely starting Monday, 08/05/2019.   Please follow-up with your surgeon and PCP for further evaluation.  If your symptoms change or fail to improve, let us know.

## 2019-08-01 NOTE — Progress Notes (Signed)
Virtual Video Visit via MyChart Note  I connected with  Christine Grant on Aug 10, 2019 at 11:20 AM EDT by the video enabled telemedicine application for , MyChart, and verified that I am speaking with the correct person using two identifiers.   I introduced myself as a Designer, jewellery with the practice. We discussed the limitations of evaluation and management by telemedicine and the availability of in person appointments. The patient expressed understanding and agreed to proceed.  The patient is: at home I am: in the office  Subjective:    CC:  Chief Complaint  Patient presents with  . Follow-up    HPI: Christine Grant is a 44 y.o. y/o female presenting via [place] today for follow-up about FMLA paperwork. She has been out on FMLA since having her appendix removed. Dr. Sheppard Coil, her PCP, provided the initial FMLA paperwork and her place of business requires the original writer of the FMLA to allow her to return to work remotely. She reports that she is feeling well and ready to return to work remotely at this time. She has not been released to drive by the surgeon, but feels they will release her in a few weeks so that she can return to work at regular capacity.   She is requesting a release note today to return to work remotely.   Past medical history, Surgical history, Family history not pertinant except as noted below, Social history, Allergies, and medications have been entered into the medical record, reviewed, and corrections made.   Review of Systems:  See HPI for pertinent positive and negatives  Objective:    General: Speaking clearly in complete sentences without any shortness of breath.   Alert and oriented x3.   Normal judgment.  No apparent acute distress.   Impression and Recommendations:    1. Return to work evaluation At this time, the patient appears to be capable of returning to work in a remote capacity. She is still managing an open wound from the surgery and  has not been released to drive, therefore full work capacity is not possible at this time. A note to return to work in remote capacity provided to the patient via [place]. Encouraged the patient to contact her PCP or surgeon if new symptoms develop.     I discussed the assessment and treatment plan with the patient. The patient was provided an opportunity to ask questions and all were answered. The patient agreed with the plan and demonstrated an understanding of the instructions.   The patient was advised to call back or seek an in-person evaluation if the symptoms worsen or if the condition fails to improve as anticipated.  I provided 15 minutes of non-face-to-face interaction with this [place] visit including intake, same-day documentation, and chart review.   Orma Render, NP

## 2019-08-02 NOTE — Telephone Encounter (Signed)
Sorry for no responding when the message was first sent. I did reach out to the company. Gave them our fax number if they needed any records. Thank you for the reminder.

## 2019-08-02 NOTE — Telephone Encounter (Signed)
Absolutely! Ill contact the PT right now.

## 2019-08-06 DIAGNOSIS — S31103D Unspecified open wound of abdominal wall, right lower quadrant without penetration into peritoneal cavity, subsequent encounter: Secondary | ICD-10-CM | POA: Diagnosis not present

## 2019-08-12 ENCOUNTER — Other Ambulatory Visit: Payer: Self-pay | Admitting: Osteopathic Medicine

## 2019-08-12 DIAGNOSIS — G8929 Other chronic pain: Secondary | ICD-10-CM

## 2019-08-12 NOTE — Telephone Encounter (Signed)
Last Ov-06/18/2019  Last refill- 07/06/02021

## 2019-08-12 NOTE — Telephone Encounter (Signed)
Request for refill denied. Patient receiving hydrocodone-APAP and Percocet regularly from another provider since June 2021. Deferring to PCP for her evaluation on going forward with further refills.

## 2019-08-13 ENCOUNTER — Other Ambulatory Visit: Payer: Self-pay | Admitting: Osteopathic Medicine

## 2019-08-13 ENCOUNTER — Encounter: Payer: Self-pay | Admitting: Osteopathic Medicine

## 2019-08-13 DIAGNOSIS — G8929 Other chronic pain: Secondary | ICD-10-CM

## 2019-08-14 NOTE — Telephone Encounter (Signed)
Last refill 070/06/2019 Last Ov- 06/18/2019

## 2019-08-15 NOTE — Addendum Note (Signed)
Addended by: Mertha Finders on: 08/15/2019 08:45 AM   Modules accepted: Orders

## 2019-08-15 NOTE — Telephone Encounter (Signed)
Rx pended

## 2019-08-19 ENCOUNTER — Encounter: Payer: Self-pay | Admitting: Osteopathic Medicine

## 2019-08-19 DIAGNOSIS — G8929 Other chronic pain: Secondary | ICD-10-CM

## 2019-08-20 ENCOUNTER — Other Ambulatory Visit: Payer: Self-pay

## 2019-08-20 DIAGNOSIS — G8929 Other chronic pain: Secondary | ICD-10-CM

## 2019-08-20 MED ORDER — CYCLOBENZAPRINE HCL 10 MG PO TABS: 10.0000 mg | ORAL_TABLET | Freq: Three times a day (TID) | ORAL | 1 refills | Status: DC | PRN

## 2019-08-20 NOTE — Telephone Encounter (Signed)
Last refill 07/16/2019 Last Ov- 08/01/2019

## 2019-08-23 DIAGNOSIS — K660 Peritoneal adhesions (postprocedural) (postinfection): Secondary | ICD-10-CM

## 2019-08-23 HISTORY — DX: Peritoneal adhesions (postprocedural) (postinfection): K66.0

## 2019-08-23 MED ORDER — TRAMADOL HCL 50 MG PO TABS: 50.0000 mg | ORAL_TABLET | Freq: Every day | ORAL | 0 refills | Status: DC | PRN

## 2019-08-27 ENCOUNTER — Other Ambulatory Visit: Payer: Self-pay | Admitting: Osteopathic Medicine

## 2019-08-28 ENCOUNTER — Encounter: Payer: Self-pay | Admitting: Osteopathic Medicine

## 2019-08-28 ENCOUNTER — Telehealth (INDEPENDENT_AMBULATORY_CARE_PROVIDER_SITE_OTHER): Payer: BC Managed Care – PPO | Admitting: Osteopathic Medicine

## 2019-08-28 DIAGNOSIS — G43709 Chronic migraine without aura, not intractable, without status migrainosus: Secondary | ICD-10-CM | POA: Diagnosis not present

## 2019-08-28 MED ORDER — TRAMADOL HCL 50 MG PO TABS: 50.0000 mg | ORAL_TABLET | Freq: Four times a day (QID) | ORAL | 2 refills | Status: DC | PRN

## 2019-08-28 MED ORDER — LISINOPRIL 10 MG PO TABS: 10.0000 mg | ORAL_TABLET | Freq: Every day | ORAL | 3 refills | Status: DC

## 2019-08-28 MED ORDER — DAPAGLIFLOZIN PROPANEDIOL 10 MG PO TABS: 10.0000 mg | ORAL_TABLET | Freq: Every day | ORAL | 3 refills | Status: DC

## 2019-08-28 MED ORDER — AIMOVIG 70 MG/ML ~~LOC~~ SOAJ: 70.0000 mg | SUBCUTANEOUS | 11 refills | Status: DC

## 2019-08-28 MED ORDER — ALPRAZOLAM 1 MG PO TABS: 1.0000 mg | ORAL_TABLET | Freq: Two times a day (BID) | ORAL | 2 refills | Status: DC | PRN

## 2019-08-28 MED ORDER — NURTEC 75 MG PO TBDP: 75.0000 mg | ORAL_TABLET | Freq: Every day | ORAL | 11 refills | Status: DC | PRN

## 2019-08-28 MED ORDER — ZOLPIDEM TARTRATE ER 12.5 MG PO TBCR: 12.5000 mg | EXTENDED_RELEASE_TABLET | Freq: Every evening | ORAL | 2 refills | Status: DC | PRN

## 2019-08-28 NOTE — Progress Notes (Signed)
Virtual Visit via Video (App used: MyChart) Note  I connected with      Christine Grant on 09/12/19 at 9:40 AM  by a telemedicine application and verified that I am speaking with the correct person using two identifiers.  Patient is at home I am in office   I discussed the limitations of evaluation and management by telemedicine and the availability of in person appointments. The patient expressed understanding and agreed to proceed.  History of Present Illness: Christine Grant is a 44 y.o. female who would like to discuss medications, migraines   Tramadol - originally prescribed by previous PCP in [place], continued by Dr Georgina Snell for migraines. She experiences migraine headaches few times per week. W/ migraine, takes 2-3 doses tramadol 50 mg per day until migraine is gone, some weeks will do this 1-2 days, some weeks is more than that. Other migraine Rx tried: Imitrex, other triptans, Fioricet, Topamax. Never really able to identify triggers.   Stopped Prozac after a few weeks - side effects   PDMP reviewed, see below     Current Meds  Medication Sig  . acetaminophen (TYLENOL) 500 MG tablet Take 500 mg by mouth every 6 (six) hours as needed.  . ALPRAZolam (XANAX) 1 MG tablet Take 1 tablet (1 mg total) by mouth 2 (two) times daily as needed.  . cyclobenzaprine (FLEXERIL) 10 MG tablet Take 1 tablet (10 mg total) by mouth 3 (three) times daily as needed for muscle spasms.  . dapagliflozin propanediol (FARXIGA) 10 MG TABS tablet Take 1 tablet (10 mg total) by mouth daily.  . Dulaglutide (TRULICITY) 8.45 XM/4.6OE SOPN Inject 0.5 mLs into the skin once a week.  . Insulin Detemir (LEVEMIR FLEXTOUCH) 100 UNIT/ML Pen Inject 26 Units into the skin daily.  . Insulin Pen Needle (ADVOCATE INSULIN PEN NEEDLES) 33G X 4 MM MISC 1 each by Does not apply route daily.  Marland Kitchen lisinopril (ZESTRIL) 10 MG tablet Take 1 tablet (10 mg total) by mouth daily.  . metoprolol succinate (TOPROL-XL) 100 MG 24 hr  tablet Take 1 tablet (100 mg total) by mouth daily.  . promethazine (PHENERGAN) 25 MG tablet Take 1 tablet (25 mg total) by mouth every 6 (six) hours as needed for nausea or vomiting.  . traMADol (ULTRAM) 50 MG tablet Take 1 tablet (50 mg total) by mouth every 6 (six) hours as needed (migraine). #30 for 30 days  . zolpidem (AMBIEN CR) 12.5 MG CR tablet Take 1 tablet (12.5 mg total) by mouth at bedtime as needed.  . [DISCONTINUED] ALPRAZolam (XANAX) 1 MG tablet TAKE 1 TABLET (1 MG TOTAL) BY MOUTH 2 (TWO) TIMES DAILY AS NEEDED.  . [DISCONTINUED] FARXIGA 10 MG TABS tablet TAKE 10 MG BY MOUTH DAILY.  . [DISCONTINUED] lisinopril (ZESTRIL) 10 MG tablet Take 1 tablet (10 mg total) by mouth daily.  . [DISCONTINUED] traMADol (ULTRAM) 50 MG tablet Take 1 tablet (50 mg total) by mouth daily as needed for up to 7 days.  . [DISCONTINUED] zolpidem (AMBIEN CR) 12.5 MG CR tablet TAKE 1 TABLET (12.5 MG TOTAL) BY MOUTH AT BEDTIME AS NEEDED.           Observations/Objective: Wt 267 lb (121.1 kg)   LMP  (LMP Unknown)   BMI 43.09 kg/m  BP Readings from Last 3 Encounters:  06/18/19 (!) 142/92  06/16/19 (!) 141/92  06/13/19 (!) 151/90   Exam: Normal Speech.  NAD  Lab and Radiology Results No results found for this or any previous  visit (from the past 72 hour(s)). No results found.     Assessment and Plan: 44 y.o. female with The encounter diagnosis was Chronic migraine without aura without status migrainosus, not intractable. Seems there might be component of medication overuse headache as well.   Patient Instructions  --> will keep on tramadol for now, but plan to taper down on this, or off this altogether  --> trial Nurtec prn migraine, option to take this every other day for prevention --> trial Aimovig for prevention, hopefully this will have fewer/no side effects compared to previous treatments, and hopefully will reduce migraine days / need for rescue medications.    PDMP not reviewed  this encounter. No orders of the defined types were placed in this encounter.  Meds ordered this encounter  Medications  . ALPRAZolam (XANAX) 1 MG tablet    Sig: Take 1 tablet (1 mg total) by mouth 2 (two) times daily as needed.    Dispense:  60 tablet    Refill:  2    Not to exceed 5 additional fills before 10/30/2019  . dapagliflozin propanediol (FARXIGA) 10 MG TABS tablet    Sig: Take 1 tablet (10 mg total) by mouth daily.    Dispense:  90 tablet    Refill:  3  . lisinopril (ZESTRIL) 10 MG tablet    Sig: Take 1 tablet (10 mg total) by mouth daily.    Dispense:  90 tablet    Refill:  3  . traMADol (ULTRAM) 50 MG tablet    Sig: Take 1 tablet (50 mg total) by mouth every 6 (six) hours as needed (migraine). #30 for 30 days    Dispense:  30 tablet    Refill:  2    Not to exceed 5 additional fills before 12/15/2019  . Rimegepant Sulfate (NURTEC) 75 MG TBDP    Sig: Take 75 mg by mouth daily as needed (migraine).    Dispense:  30 tablet    Refill:  11    If prior auth needed: has tried/failed other migraine Rx: Imitrex, few other triptans, Fioricet, Topamax, beta blocker, Tramadol  . Erenumab-aooe (AIMOVIG) 70 MG/ML SOAJ    Sig: Inject 70 mg into the skin every 30 (thirty) days.    Dispense:  1.12 mL    Refill:  11    If prior auth needed: has tried/failed other migraine Rx: Imitrex, few other triptans, Fioricet, Topamax, beta blocker, Tramadol  . zolpidem (AMBIEN CR) 12.5 MG CR tablet    Sig: Take 1 tablet (12.5 mg total) by mouth at bedtime as needed.    Dispense:  30 tablet    Refill:  2    Not to exceed 4 additional fills before 09/30/2019   There are no Patient Instructions on file for this visit.  Instructions sent via MyChart. If MyChart not available, pt was given option for info via personal e-mail w/ no guarantee of protected health info over unsecured e-mail communication, and MyChart sign-up instructions were sent to patient.   Follow Up Instructions: Return in about  6 weeks (around 10/09/2019) for virtual or in-office, recheck migraine headaches .    I discussed the assessment and treatment plan with the patient. The patient was provided an opportunity to ask questions and all were answered. The patient agreed with the plan and demonstrated an understanding of the instructions.   The patient was advised to call back or seek an in-person evaluation if any new concerns, if symptoms worsen or if the condition  fails to improve as anticipated.  30 minutes of non-face-to-face time was provided during this encounter.      . . . . . . . . . . . . . Marland Kitchen                   Historical information moved to improve visibility of documentation.  Past Medical History:  Diagnosis Date  . Abnormal uterine bleeding   . Anxiety   . Arthritis    knees  . Chronic pelvic pain in female   . Depression   . Endometriosis of pelvis   . Fibromyalgia   . History of ketoacidosis    01-15-2014  . PONV (postoperative nausea and vomiting)   . Sinus tachycardia   . Type 2 diabetes mellitus ([place])   . Wears contact lenses    Past Surgical History:  Procedure Laterality Date  . ABDOMINAL HYSTERECTOMY    . BREAST REDUCTION SURGERY  age 21  . CESAREAN SECTION  2002,  2005,  2008  . DILATION AND CURETTAGE OF UTERUS N/A 02/13/2014   Procedure: DILATATION AND CURETTAGE;  Surgeon: Everitt Amber, MD;  Location: [facility];  Service: Gynecology;  Laterality: N/A;  . LEFT OOPHORECTOMY  2019  . MANDIBLE SURGERY  age 21   Correct overbite  . REDUCTION MAMMAPLASTY     Social History   Tobacco Use  . Smoking status: Never Smoker  . Smokeless tobacco: Never Used  Substance Use Topics  . Alcohol use: No   family history includes Breast cancer in her maternal aunt; Diabetes in her mother; Endometriosis in her maternal aunt; Hyperlipidemia in her mother; Hypertension in her mother. She was adopted.  Medications: Current Outpatient  Medications  Medication Sig Dispense Refill  . acetaminophen (TYLENOL) 500 MG tablet Take 500 mg by mouth every 6 (six) hours as needed.    . ALPRAZolam (XANAX) 1 MG tablet Take 1 tablet (1 mg total) by mouth 2 (two) times daily as needed. 60 tablet 2  . cyclobenzaprine (FLEXERIL) 10 MG tablet Take 1 tablet (10 mg total) by mouth 3 (three) times daily as needed for muscle spasms. 270 tablet 1  . dapagliflozin propanediol (FARXIGA) 10 MG TABS tablet Take 1 tablet (10 mg total) by mouth daily. 90 tablet 3  . Dulaglutide (TRULICITY) 6.38 GY/6.5LD SOPN Inject 0.5 mLs into the skin once a week. 4 pen 11  . Insulin Detemir (LEVEMIR FLEXTOUCH) 100 UNIT/ML Pen Inject 26 Units into the skin daily. 10 pen 12  . Insulin Pen Needle (ADVOCATE INSULIN PEN NEEDLES) 33G X 4 MM MISC 1 each by Does not apply route daily. 100 each 12  . lisinopril (ZESTRIL) 10 MG tablet Take 1 tablet (10 mg total) by mouth daily. 90 tablet 3  . metoprolol succinate (TOPROL-XL) 100 MG 24 hr tablet Take 1 tablet (100 mg total) by mouth daily. 90 tablet 1  . promethazine (PHENERGAN) 25 MG tablet Take 1 tablet (25 mg total) by mouth every 6 (six) hours as needed for nausea or vomiting. 30 tablet 2  . traMADol (ULTRAM) 50 MG tablet Take 1 tablet (50 mg total) by mouth every 6 (six) hours as needed (migraine). #30 for 30 days 30 tablet 2  . zolpidem (AMBIEN CR) 12.5 MG CR tablet Take 1 tablet (12.5 mg total) by mouth at bedtime as needed. 30 tablet 2  . Erenumab-aooe (AIMOVIG) 70 MG/ML SOAJ Inject 70 mg into the skin every 30 (thirty) days. 1.12 mL 11  .  gabapentin (NEURONTIN) 300 MG capsule Take 3 capsules (900 mg total) by mouth 3 (three) times daily. 810 capsule 3  . Rimegepant Sulfate (NURTEC) 75 MG TBDP Take 75 mg by mouth daily as needed (migraine). 30 tablet 11   No current facility-administered medications for this visit.   Allergies  Allergen Reactions  . Toradol [Ketorolac Tromethamine] Other (See Comments)    Chest  tightness, flushed    Fill Date ID   Written Drug Qty Days Prescriber Rx # Pharmacy Refill   Daily Dose* Pymt Type PMP    08/23/2019  1   08/23/2019  Tramadol Hcl 50 MG Tablet  10.00  10 Na Ale   [id number]   Nor ([id number])   0/0  5.00 MME  Comm Ins   [place]  08/04/2019  1   06/03/2019  Zolpidem Tart Er 12.5 MG Tab  30.00  30 Na Ale   [id number]   Nor (5546)   2/2  0.62 LME  Comm Ins   [place]  08/02/2019  1   08/02/2019  Hydrocodone-Acetamin 5-325 MG  30.00  10 Ju [place]   [id number]   Nor (5546)   0/0  15.00 MME  Comm Ins   [place]  07/29/2019  1   06/03/2019  Alprazolam 1 MG Tablet  60.00  30 Na Ale   [id number]   Nor (5546)   2/2  4.00 LME  Comm Ins   [place]  07/24/2019  1   07/24/2019  Oxycodone-Acetaminophen 5-325  30.00  10 Ju Rey   [id number]   Nor (5546)   0/0  22.50 MME  Private Pay   [place]  07/19/2019  1   07/19/2019  Oxycodone-Acetaminophen 5-325  20.00  10 Ju Rey   [id number]   [place] (5546)   0/0  15.00 MME  Comm Ins   [place]  07/16/2019  1   07/16/2019  Hydrocodone-Acetamin 5-325 MG  30.00  10 Ju Rey   [id number]   Nor (5546)   0/0  15.00 MME  Comm Ins   [place]  07/16/2019  1   07/16/2019  Tramadol Hcl 50 MG Tablet  30.00  30 Na Ale   [id number]   [place] (5546)   0/0  5.00 MME  Comm Ins   [place]  07/11/2019  1   07/11/2019  Hydrocodone-Acetamin 5-325 MG  20.00  5 Ju Rey   [id number]   Nor (5546)   0/0  20.00 MME  Comm Ins   [place]  07/05/2019  1   06/03/2019  Zolpidem Tart Er 12.5 MG Tab  30.00  30 Na Ale   [id number]   [place] (5546)   1/2  0.62 LME  Comm Ins   [place]  07/01/2019  1   07/01/2019  Hydrocodone-Acetamin 5-325 MG  20.00  5 Ju Rey   [id number]   Nor (5546)   0/0  20.00 MME  Comm Ins   [place]  06/30/2019  1   06/03/2019  Alprazolam 1 MG Tablet  60.00  30 Na Ale   [id number]   Nor (5546)   1/2  4.00 LME  Comm Ins   [place]  06/18/2019  1   06/18/2019  Tramadol Hcl 50 MG Tablet  30.00  30 Na Ale   [id number]   Nor (5546)   0/0  5.00 MME  Comm Ins     06/03/2019  1   06/03/2019  Zolpidem Tart Er 12.5 MG Tab  30.00  30 Na Ale   [id number]  Nor (5546)   0/2  0.62 LME  Comm Ins   [place]   06/03/2019  1   06/03/2019  Alprazolam 1 MG Tablet  60.00  30 Na Ale   [id number]   Nor (5546)   0/2  4.00 LME  Comm Ins   [place]  05/21/2019  1   05/21/2019  Tramadol Hcl 50 MG Tablet  30.00  30 Na Ale   [id number]   Nor (5546)   0/0  5.00 MME  Comm Ins   [place]  05/03/2019  1   05/03/2019  Alprazolam 1 MG Tablet  60.00  30 Na Ale   [id number]   Nor (5546)   0/0  4.00 LME  Comm Ins   [place]  05/01/2019  1   04/03/2019  Zolpidem Tart Er 12.5 MG Tab  30.00  30 Na Ale   [id number]   Nor (5546)   1/1  0.62 LME  Comm Ins   [place]  04/22/2019  1   04/22/2019  Tramadol Hcl 50 MG Tablet  30.00  30 Na Ale   [id number]   Nor (5546)   0/0  5.00 MME  Comm Ins   [place]  04/04/2019  1   04/04/2019  Alprazolam 1 MG Tablet  60.00  30 Ernestene Kiel   [id number]   Nor (5546)   0/0  4.00 LME  Comm Ins   [place]  04/03/2019  1   04/03/2019  Zolpidem Tart Er 12.5 MG Tab  30.00  30 Na Ale   [id number]   Nor (5546)   0/1  0.62 LME  Comm Ins   [place]  03/22/2019  1   03/22/2019  Tramadol Hcl 50 MG Tablet  30.00  30 Na Ale   [id number]   Nor (5546)   0/0  5.00 MME  Comm Ins   [place]  03/08/2019  1   03/08/2019  Alprazolam 1 MG Tablet  60.00  30 Na Ale   [id number]   Nor (5546)   0/0  4.00 LME  Comm Ins   [place]  02/26/2019  1   01/21/2019  Zolpidem Tart Er 12.5 MG Tab  30.00  30 Na Ale   [id number]   Nor (5546)   1/1  0.62 LME  Comm Ins   [place]  02/21/2019  1   02/21/2019  Tramadol Hcl 50 MG Tablet  30.00  30 Na Ale   [id number]   Nor (5546)   0/0  5.00 MME  Comm Ins   [place]  02/05/2019  1   09/11/2018  Alprazolam 1 MG Tablet  60.00  30 [place]   [id number]   Nor (5546)   5/5  4.00 LME  Comm Ins   [place]  01/24/2019  1   01/24/2019  Tramadol Hcl 50 MG Tablet  30.00  30 Na Ale   [id number]   Nor (5546)   0/0  5.00 MME  Comm Ins   [place]  01/21/2019  1   01/21/2019  Zolpidem Tart Er 12.5 MG Tab  30.00  30 Na Ale   [id number]   Nor (5546)   0/1  0.62 LME  Comm Ins   [place]                    08/02/2019  1   08/02/2019  Hydrocodone-Acetamin 5-325 MG  30.00  10 Ju Rey   [id number]   Nor (5546)   0/0  15.00 MME  Comm Ins     07/16/2019  1   07/16/2019  Hydrocodone-Acetamin 5-325 MG  30.00  10 Ju Rey   [id number]   Nor (5546)   0/0  15.00 MME  Comm Ins   [place]  07/11/2019  1   07/11/2019  Hydrocodone-Acetamin 5-325 MG  20.00  5 Ju Rey   [id number]   Nor (5546)   0/0  20.00 MME  Comm Ins   [place]  07/01/2019  1   07/01/2019  Hydrocodone-Acetamin 5-325 MG  20.00  5 Ju Rey   [id number]   Nor (5546)   0/0  20.00 MME  Comm Ins  Commercial Insurance coverage [place]        08/23/2019  1   08/23/2019  Tramadol Hcl 50 MG Tablet  10.00  10 Na Ale   [id number]   Nor (5546)   0/0  5.00 MME  Comm Ins   [place]  07/16/2019  1   07/16/2019  Tramadol Hcl 50 MG Tablet  30.00  30 Na Ale   [id number]   Nor (5546)   0/0  5.00 MME  Comm Ins   [place]  06/18/2019  1   06/18/2019  Tramadol Hcl 50 MG Tablet  30.00  30 Na Ale   [id number]   Nor (5546)   0/0  5.00 MME  Comm Ins   [place]  05/21/2019  1   05/21/2019  Tramadol Hcl 50 MG Tablet  30.00  30 Na Ale   [id number]   Nor (5546)   0/0  5.00 MME  Comm Ins   [place]  04/22/2019  1   04/22/2019  Tramadol Hcl 50 MG Tablet  30.00  30 Na Ale   [id number]   Nor (5546)   0/0  5.00 MME  Comm Ins   [place]  03/22/2019  1   03/22/2019  Tramadol Hcl 50 MG Tablet  30.00  30 Na Ale   [id number]   Nor (5546)   0/0  5.00 MME  Comm Ins   [place]  02/21/2019  1   02/21/2019  Tramadol Hcl 50 MG Tablet  30.00  30 Na Ale   [id number]   Nor (5546)   0/0  5.00 MME  Comm Ins  Commercial Insurance coverage   01/24/2019  1   01/24/2019  Tramadol Hcl 50 MG Tablet  30.00  30 Na Ale   [id number]   [place] (5546)   0/0  5.00 MME  Comm Ins

## 2019-08-28 NOTE — Patient Instructions (Signed)
-->   will keep on tramadol for now, but plan to taper down on this, or off this altogether  --> trial Nurtec prn migraine, option to take this every other day for prevention --> trial Aimovig for prevention, hopefully this will have fewer/no side effects compared to previous treatments, and hopefully will reduce migraine days / need for rescue medications.

## 2019-10-10 ENCOUNTER — Encounter: Payer: Self-pay | Admitting: Osteopathic Medicine

## 2019-10-10 DIAGNOSIS — F331 Major depressive disorder, recurrent, moderate: Secondary | ICD-10-CM | POA: Diagnosis not present

## 2019-10-10 DIAGNOSIS — F411 Generalized anxiety disorder: Secondary | ICD-10-CM | POA: Diagnosis not present

## 2019-10-10 DIAGNOSIS — F410 Panic disorder [episodic paroxysmal anxiety] without agoraphobia: Secondary | ICD-10-CM | POA: Diagnosis not present

## 2019-10-11 ENCOUNTER — Ambulatory Visit: Payer: BC Managed Care – PPO | Admitting: Osteopathic Medicine

## 2019-10-16 ENCOUNTER — Ambulatory Visit (INDEPENDENT_AMBULATORY_CARE_PROVIDER_SITE_OTHER): Payer: BC Managed Care – PPO | Admitting: Osteopathic Medicine

## 2019-10-16 ENCOUNTER — Other Ambulatory Visit: Payer: Self-pay

## 2019-10-16 ENCOUNTER — Encounter: Payer: Self-pay | Admitting: Osteopathic Medicine

## 2019-10-16 VITALS — BP 134/85 | HR 128 | Wt 257.0 lb

## 2019-10-16 DIAGNOSIS — G43709 Chronic migraine without aura, not intractable, without status migrainosus: Secondary | ICD-10-CM

## 2019-10-16 DIAGNOSIS — F339 Major depressive disorder, recurrent, unspecified: Secondary | ICD-10-CM

## 2019-10-16 DIAGNOSIS — G8929 Other chronic pain: Secondary | ICD-10-CM | POA: Diagnosis not present

## 2019-10-16 DIAGNOSIS — M25512 Pain in left shoulder: Secondary | ICD-10-CM

## 2019-10-16 MED ORDER — LEVEMIR FLEXTOUCH 100 UNIT/ML ~~LOC~~ SOPN: 26.0000 [IU] | PEN_INJECTOR | Freq: Every day | SUBCUTANEOUS | 11 refills | Status: DC

## 2019-10-16 MED ORDER — TRULICITY 0.75 MG/0.5ML ~~LOC~~ SOAJ: 0.7500 mg | SUBCUTANEOUS | 11 refills | Status: DC

## 2019-10-16 MED ORDER — NURTEC 75 MG PO TBDP: 75.0000 mg | ORAL_TABLET | Freq: Every day | ORAL | 11 refills | Status: DC | PRN

## 2019-10-16 MED ORDER — AIMOVIG 70 MG/ML ~~LOC~~ SOAJ: 70.0000 mg | SUBCUTANEOUS | 11 refills | Status: DC

## 2019-10-16 NOTE — Progress Notes (Signed)
Christine Grant is a 44 y.o. female who presents to  [facility]  today, 10/16/19, seeking care for the following:  . Recheck migraines  o last visit 08/28/19 we discussed frequent use of Trnaadol for headaches is a concern, ideally would want to be on more effective prevention/treatment and avoid opiates/opiate-like Rx for headache. --> plan to taper off Tramadol, trial Nurtec prn and Aimovig ppx  o Today 10/16/19 reports Nurtec is working fairly well when needed, still needing at least twice weekly. Started Aimovig as well, no noticeable reduction migraine days.  o Having problame getting refills authorized, which is strange to require PA for refill rather than for initial Rx...      ASSESSMENT & PLAN with other pertinent findings:  The primary encounter diagnosis was Chronic migraine without aura without status migrainosus, not intractable. Diagnoses of Depression, recurrent ([place]) and Chronic left shoulder pain were also pertinent to this visit.    1. Chronic migraine without aura without status migrainosus, not intractable Refill Nurtec, also resent Aimovig Continue current doses, may consider increase Aimovig   2. Depression, recurrent [facility]) Following w/ therapist and she states they will be referring her to psychiatry, has appt later today   3. Chronic left shoulder pain Advised f/u sports med       Meds ordered this encounter  Medications  . Rimegepant Sulfate (NURTEC) 75 MG TBDP    Sig: Take 75 mg by mouth daily as needed (migraine).    Dispense:  36 tablet    Refill:  11    If prior auth needed: has tried/failed other migraine Rx: Imitrex, few other triptans, Fioricet, Topamax, beta blocker, Tramadol  . Erenumab-aooe (AIMOVIG) 70 MG/ML SOAJ    Sig: Inject 70 mg into the skin every 30 (thirty) days.    Dispense:  1.12 mL    Refill:  11    If prior auth needed: has tried/failed other migraine Rx: Imitrex, few  other triptans, Fioricet, Topamax, beta blocker, Tramadol  . insulin detemir (LEVEMIR FLEXTOUCH) 100 UNIT/ML FlexPen    Sig: Inject 26 Units into the skin daily.    Dispense:  30 mL    Refill:  11    Replaces triseba  . Dulaglutide (TRULICITY) 0.53 ZJ/6.7HA SOPN    Sig: Inject 0.75 mg into the skin once a week.    Dispense:  5 mL    Refill:  11    Patient will have discount coupon       Follow-up instructions: Return in about 6 weeks (around 11/27/2019) for VIRTUAL VISIT / IN-OFFICE VISIT follow up migraines. VISIT WITH SPORTS MEDICINE FOR ORTHOPEDIC ISSUE.                                         BP 134/85 (BP Location: Left Arm, Patient Position: Sitting)   Pulse (!) 128   Wt 257 lb (116.6 kg)   LMP  (LMP Unknown)   SpO2 98%   BMI 41.48 kg/m   Current Meds  Medication Sig  . acetaminophen (TYLENOL) 500 MG tablet Take 500 mg by mouth every 6 (six) hours as needed.  . ALPRAZolam (XANAX) 1 MG tablet Take 1 tablet (1 mg total) by mouth 2 (two) times daily as needed.  . cyclobenzaprine (FLEXERIL) 10 MG tablet Take 1 tablet (10 mg total) by mouth 3 (three) times daily as  needed for muscle spasms.  . dapagliflozin propanediol (FARXIGA) 10 MG TABS tablet Take 1 tablet (10 mg total) by mouth daily.  . Dulaglutide (TRULICITY) 9.44 HQ/7.5FF SOPN Inject 0.75 mg into the skin once a week.  Eduard Roux (AIMOVIG) 70 MG/ML SOAJ Inject 70 mg into the skin every 30 (thirty) days.  . insulin detemir (LEVEMIR FLEXTOUCH) 100 UNIT/ML FlexPen Inject 26 Units into the skin daily.  . Insulin Pen Needle (ADVOCATE INSULIN PEN NEEDLES) 33G X 4 MM MISC 1 each by Does not apply route daily.  Marland Kitchen lisinopril (ZESTRIL) 10 MG tablet Take 1 tablet (10 mg total) by mouth daily.  . metoprolol succinate (TOPROL-XL) 100 MG 24 hr tablet Take 1 tablet (100 mg total) by mouth daily.  . promethazine (PHENERGAN) 25 MG tablet Take 1 tablet (25 mg total) by mouth every 6 (six) hours  as needed for nausea or vomiting.  . Rimegepant Sulfate (NURTEC) 75 MG TBDP Take 75 mg by mouth daily as needed (migraine).  . zolpidem (AMBIEN CR) 12.5 MG CR tablet Take 1 tablet (12.5 mg total) by mouth at bedtime as needed.  . [DISCONTINUED] Dulaglutide (TRULICITY) 6.38 GY/6.5LD SOPN Inject 0.5 mLs into the skin once a week.  . [DISCONTINUED] Erenumab-aooe (AIMOVIG) 70 MG/ML SOAJ Inject 70 mg into the skin every 30 (thirty) days.  . [DISCONTINUED] Insulin Detemir (LEVEMIR FLEXTOUCH) 100 UNIT/ML Pen Inject 26 Units into the skin daily.  . [DISCONTINUED] Rimegepant Sulfate (NURTEC) 75 MG TBDP Take 75 mg by mouth daily as needed (migraine).    No results found for this or any previous visit (from the past 72 hour(s)).  No results found.     All questions at time of visit were answered - patient instructed to contact office with any additional concerns or updates.  ER/RTC precautions were reviewed with the patient as applicable.   Please note: voice recognition software was used to produce this document, and typos may escape review. Please contact Dr. Sheppard Coil for any needed clarifications.

## 2019-10-17 ENCOUNTER — Other Ambulatory Visit: Payer: Self-pay

## 2019-10-17 DIAGNOSIS — F411 Generalized anxiety disorder: Secondary | ICD-10-CM | POA: Diagnosis not present

## 2019-10-17 DIAGNOSIS — F331 Major depressive disorder, recurrent, moderate: Secondary | ICD-10-CM | POA: Diagnosis not present

## 2019-10-17 DIAGNOSIS — F410 Panic disorder [episodic paroxysmal anxiety] without agoraphobia: Secondary | ICD-10-CM | POA: Diagnosis not present

## 2019-10-17 MED ORDER — NURTEC 75 MG PO TBDP: 75.0000 mg | ORAL_TABLET | Freq: Every day | ORAL | 99 refills | Status: DC | PRN

## 2019-10-19 ENCOUNTER — Other Ambulatory Visit: Payer: Self-pay | Admitting: Osteopathic Medicine

## 2019-10-23 DIAGNOSIS — F331 Major depressive disorder, recurrent, moderate: Secondary | ICD-10-CM | POA: Diagnosis not present

## 2019-10-23 DIAGNOSIS — F411 Generalized anxiety disorder: Secondary | ICD-10-CM | POA: Diagnosis not present

## 2019-10-23 DIAGNOSIS — F410 Panic disorder [episodic paroxysmal anxiety] without agoraphobia: Secondary | ICD-10-CM | POA: Diagnosis not present

## 2019-10-24 ENCOUNTER — Encounter: Payer: Self-pay | Admitting: Osteopathic Medicine

## 2019-10-24 DIAGNOSIS — R112 Nausea with vomiting, unspecified: Secondary | ICD-10-CM

## 2019-10-25 MED ORDER — PROMETHAZINE HCL 25 MG PO TABS: 25.0000 mg | ORAL_TABLET | Freq: Four times a day (QID) | ORAL | 2 refills | Status: DC | PRN

## 2019-10-31 ENCOUNTER — Telehealth: Payer: Self-pay

## 2019-10-31 DIAGNOSIS — F331 Major depressive disorder, recurrent, moderate: Secondary | ICD-10-CM | POA: Diagnosis not present

## 2019-10-31 DIAGNOSIS — F411 Generalized anxiety disorder: Secondary | ICD-10-CM | POA: Diagnosis not present

## 2019-10-31 DIAGNOSIS — F410 Panic disorder [episodic paroxysmal anxiety] without agoraphobia: Secondary | ICD-10-CM | POA: Diagnosis not present

## 2019-10-31 NOTE — Telephone Encounter (Signed)
Unable to do PA for Nurtec. It states her insurance has been terminated.

## 2019-11-07 DIAGNOSIS — F410 Panic disorder [episodic paroxysmal anxiety] without agoraphobia: Secondary | ICD-10-CM | POA: Diagnosis not present

## 2019-11-07 DIAGNOSIS — F331 Major depressive disorder, recurrent, moderate: Secondary | ICD-10-CM | POA: Diagnosis not present

## 2019-11-07 DIAGNOSIS — F411 Generalized anxiety disorder: Secondary | ICD-10-CM | POA: Diagnosis not present

## 2019-11-12 ENCOUNTER — Other Ambulatory Visit: Payer: Self-pay

## 2019-11-12 MED ORDER — GABAPENTIN 300 MG PO CAPS: 900.0000 mg | ORAL_CAPSULE | Freq: Three times a day (TID) | ORAL | 0 refills | Status: DC

## 2019-11-13 ENCOUNTER — Encounter: Payer: Self-pay | Admitting: Osteopathic Medicine

## 2019-11-13 ENCOUNTER — Other Ambulatory Visit: Payer: Self-pay

## 2019-11-13 MED ORDER — GABAPENTIN 300 MG PO CAPS: 900.0000 mg | ORAL_CAPSULE | Freq: Three times a day (TID) | ORAL | 0 refills | Status: DC

## 2019-11-25 ENCOUNTER — Other Ambulatory Visit: Payer: Self-pay | Admitting: Osteopathic Medicine

## 2019-11-25 ENCOUNTER — Encounter: Payer: Self-pay | Admitting: Osteopathic Medicine

## 2019-11-25 DIAGNOSIS — G43709 Chronic migraine without aura, not intractable, without status migrainosus: Secondary | ICD-10-CM

## 2019-11-25 NOTE — Telephone Encounter (Signed)
Looks like the pharmacy has already sent a refill request.  Can you look into this?

## 2019-11-27 ENCOUNTER — Telehealth (INDEPENDENT_AMBULATORY_CARE_PROVIDER_SITE_OTHER): Payer: Self-pay | Admitting: Osteopathic Medicine

## 2019-11-27 ENCOUNTER — Encounter: Payer: Self-pay | Admitting: Osteopathic Medicine

## 2019-11-27 DIAGNOSIS — G8929 Other chronic pain: Secondary | ICD-10-CM

## 2019-11-27 DIAGNOSIS — G43709 Chronic migraine without aura, not intractable, without status migrainosus: Secondary | ICD-10-CM

## 2019-11-27 DIAGNOSIS — Z1231 Encounter for screening mammogram for malignant neoplasm of breast: Secondary | ICD-10-CM

## 2019-11-27 DIAGNOSIS — M25512 Pain in left shoulder: Secondary | ICD-10-CM

## 2019-11-27 DIAGNOSIS — R229 Localized swelling, mass and lump, unspecified: Secondary | ICD-10-CM

## 2019-11-27 MED ORDER — NURTEC 75 MG PO TBDP: 75.0000 mg | ORAL_TABLET | Freq: Every day | ORAL | 99 refills | Status: DC | PRN

## 2019-11-27 MED ORDER — AIMOVIG 70 MG/ML ~~LOC~~ SOAJ
140.0000 mg | SUBCUTANEOUS | 11 refills | Status: DC
Start: 2019-11-27 — End: 2020-02-14

## 2019-11-27 NOTE — Progress Notes (Signed)
Virtual Visit via Video (App used: MyChart) Note  I connected with      Christine Grant on 12-25-19 at 8:11 AM  by a telemedicine application and verified that I am speaking with the correct person using two identifiers.  Patient is at home I am in office   I discussed the limitations of evaluation and management by telemedicine and the availability of in person appointments. The patient expressed understanding and agreed to proceed.  History of Present Illness: Christine Grant is a 44 y.o. female who would like to discuss migraines, needs mammogram, lump on anterior thigh   Nurtec working pretty well as needed, but headache days per month about the same. Due for next Aimovig shot next week. Has been on this for about 2 months  Reports lump on thigh, not bothering her too much, tender to touch, no rash/redness.     Observations/Objective: LMP  (LMP Unknown)  BP Readings from Last 3 Encounters:  10/16/19 134/85  06/18/19 (!) 142/92  06/16/19 (!) 141/92   Exam: Normal Speech.  NAD  Lab and Radiology Results No results found for this or any previous visit (from the past 72 hour(s)). No results found.     Assessment and Plan: 44 y.o. female with The primary encounter diagnosis was Chronic migraine without aura without status migrainosus, not intractable. Diagnoses of Breast cancer screening by mammogram, Chronic left shoulder pain, and Subcutaneous mass were also pertinent to this visit.  1. Chronic migraine without aura without status migrainosus, not intractable Increase Aimovig If issues w/ higher dose or not able to get this covered, may consider Nurtec every other day for migraine ppx   2. Breast cancer screening by mammogram ordered  3. Chronic left shoulder pain Following w/ sports med  4. Subcutaneous mass Unable to assess virtually but ddx includes lipoma, cyst, possible SQ possibly muscular, may kindly ask sports med to briefly look and if it's concerning  for muscle mass they'd be able to manage this/refer, otherwise can come to office here to discuss w/ PCP    PDMP not reviewed this encounter. Orders Placed This Encounter  Procedures  . MM 3D SCREEN BREAST BILATERAL    Order Specific Question:   Reason for Exam (SYMPTOM  OR DIAGNOSIS REQUIRED)    Answer:   screen    Order Specific Question:   Is the patient pregnant?    Answer:   No    Order Specific Question:   Preferred imaging location?    Answer:   [facility]   Meds ordered this encounter  Medications  . Rimegepant Sulfate (NURTEC) 75 MG TBDP    Sig: Take 75 mg by mouth daily as needed (migraine).    Dispense:  24 tablet    Refill:  prn    If prior auth needed: has tried/failed other migraine Rx: Imitrex, few other triptans, Fioricet, Topamax, beta blocker, Tramadol  . Erenumab-aooe (AIMOVIG) 70 MG/ML SOAJ    Sig: Inject 140 mg into the skin every 30 (thirty) days.    Dispense:  4 mL    Refill:  11    If prior auth needed: has tried/failed other migraine Rx: Imitrex, few other triptans, Fioricet, Topamax, beta blocker, Tramadol   Patient Instructions  Rx refilled Increase Aimovig from 70 mg --> 140 mg monthly   For mammogram, call to schedule: [phone]     Instructions sent via MyChart. If MyChart not available, pt was given option for info via personal e-mail w/ no  guarantee of protected health info over unsecured e-mail communication, and MyChart sign-up instructions were sent to patient.   Follow Up Instructions: Return in about 6 weeks (around 01/08/2020) for FOLLOW UP VIA MYCHART MESSAGE (SET TO SEND LATER), RECHECK ON NEW MEDICATION.    I discussed the assessment and treatment plan with the patient. The patient was provided an opportunity to ask questions and all were answered. The patient agreed with the plan and demonstrated an understanding of the instructions.   The patient was advised to call back or seek an in-person evaluation if any new  concerns, if symptoms worsen or if the condition fails to improve as anticipated.  30 minutes of non-face-to-face time was provided during this encounter.      . . . . . . . . . . . . . Marland Kitchen                   Historical information moved to improve visibility of documentation.  Past Medical History:  Diagnosis Date  . Abnormal uterine bleeding   . Anxiety   . Arthritis    knees  . Chronic pelvic pain in female   . Depression   . Endometriosis of pelvis   . Fibromyalgia   . History of ketoacidosis    01-15-2014  . PONV (postoperative nausea and vomiting)   . Sinus tachycardia   . Type 2 diabetes mellitus ([place])   . Wears contact lenses    Past Surgical History:  Procedure Laterality Date  . ABDOMINAL HYSTERECTOMY    . BREAST REDUCTION SURGERY  age 46  . CESAREAN SECTION  2002,  2005,  2008  . DILATION AND CURETTAGE OF UTERUS N/A 02/13/2014   Procedure: DILATATION AND CURETTAGE;  Surgeon: Everitt Amber, MD;  Location: [facility];  Service: Gynecology;  Laterality: N/A;  . LEFT OOPHORECTOMY  2019  . MANDIBLE SURGERY  age 35   Correct overbite  . REDUCTION MAMMAPLASTY     Social History   Tobacco Use  . Smoking status: Never Smoker  . Smokeless tobacco: Never Used  Substance Use Topics  . Alcohol use: No   family history includes Breast cancer in her maternal aunt; Diabetes in her mother; Endometriosis in her maternal aunt; Hyperlipidemia in her mother; Hypertension in her mother. She was adopted.  Medications: Current Outpatient Medications  Medication Sig Dispense Refill  . acetaminophen (TYLENOL) 500 MG tablet Take 500 mg by mouth every 6 (six) hours as needed.    . ALPRAZolam (XANAX) 1 MG tablet TAKE 1 TABLET (1 MG TOTAL) BY MOUTH 2 (TWO) TIMES DAILY AS NEEDED. 60 tablet 2  . cyclobenzaprine (FLEXERIL) 10 MG tablet Take 1 tablet (10 mg total) by mouth 3 (three) times daily as needed for muscle spasms. 270 tablet 1  .  dapagliflozin propanediol (FARXIGA) 10 MG TABS tablet Take 1 tablet (10 mg total) by mouth daily. 90 tablet 3  . Dulaglutide (TRULICITY) 6.01 UX/3.2TF SOPN Inject 0.75 mg into the skin once a week. 5 mL 11  . Erenumab-aooe (AIMOVIG) 70 MG/ML SOAJ Inject 140 mg into the skin every 30 (thirty) days. 4 mL 11  . gabapentin (NEURONTIN) 300 MG capsule Take 3 capsules (900 mg total) by mouth 3 (three) times daily. 810 capsule 0  . insulin detemir (LEVEMIR FLEXTOUCH) 100 UNIT/ML FlexPen Inject 26 Units into the skin daily. 30 mL 11  . Insulin Pen Needle (ADVOCATE INSULIN PEN NEEDLES) 33G X 4 MM MISC 1 each by Does  not apply route daily. 100 each 12  . lisinopril (ZESTRIL) 10 MG tablet Take 1 tablet (10 mg total) by mouth daily. 90 tablet 3  . metoprolol succinate (TOPROL-XL) 100 MG 24 hr tablet TAKE 1 TABLET BY MOUTH EVERY DAY 90 tablet 0  . promethazine (PHENERGAN) 25 MG tablet Take 1 tablet (25 mg total) by mouth every 6 (six) hours as needed for nausea or vomiting. 30 tablet 2  . Rimegepant Sulfate (NURTEC) 75 MG TBDP Take 75 mg by mouth daily as needed (migraine). 24 tablet prn  . traMADol (ULTRAM) 50 MG tablet TAKE 1 TABLET (50 MG TOTAL) BY MOUTH EVERY 6 (SIX) HOURS AS NEEDED (MIGRAINE). #30 FOR 30 DAYS 30 tablet 2  . zolpidem (AMBIEN CR) 12.5 MG CR tablet Take 1 tablet (12.5 mg total) by mouth at bedtime as needed. 30 tablet 2   No current facility-administered medications for this visit.   Allergies  Allergen Reactions  . Toradol [Ketorolac Tromethamine] Other (See Comments)    Chest tightness, flushed

## 2019-11-27 NOTE — Patient Instructions (Signed)
Rx refilled Increase Aimovig from 70 mg --> 140 mg monthly   For mammogram, call to schedule: [phone]

## 2019-12-12 ENCOUNTER — Other Ambulatory Visit: Payer: Self-pay | Admitting: Osteopathic Medicine

## 2019-12-12 NOTE — Telephone Encounter (Signed)
Please call patient to confirm if she actually requested this

## 2019-12-12 NOTE — Telephone Encounter (Signed)
Left a detailed vm msg for pt regarding provider's note. Aware to return a call back to the clinic to verify whether pt is taking the medication or auto refill request from the pharmacy. Direct call back info provided.

## 2019-12-12 NOTE — Telephone Encounter (Signed)
[facility] requesting med [facility]ill for fluoxetine. Rx not listed in active med list.

## 2020-01-02 ENCOUNTER — Telehealth: Payer: Self-pay | Admitting: Neurology

## 2020-01-02 NOTE — Telephone Encounter (Signed)
Tried to submit PA for Nurtec via Covermymeds using last insurance information given to [place] (September 2021).   "This request cannot be processed due to the member's coverage has terminated. Resubmit using active member ID information."  Mychart message sent to patient.

## 2020-01-14 ENCOUNTER — Other Ambulatory Visit: Payer: Self-pay | Admitting: Osteopathic Medicine

## 2020-02-05 ENCOUNTER — Other Ambulatory Visit: Payer: Self-pay | Admitting: Osteopathic Medicine

## 2020-02-12 ENCOUNTER — Encounter: Payer: Self-pay | Admitting: Osteopathic Medicine

## 2020-02-12 ENCOUNTER — Other Ambulatory Visit: Payer: Self-pay | Admitting: Osteopathic Medicine

## 2020-02-12 DIAGNOSIS — G8929 Other chronic pain: Secondary | ICD-10-CM

## 2020-02-12 DIAGNOSIS — M549 Dorsalgia, unspecified: Secondary | ICD-10-CM

## 2020-02-14 ENCOUNTER — Encounter: Payer: Self-pay | Admitting: Family Medicine

## 2020-02-14 ENCOUNTER — Telehealth (INDEPENDENT_AMBULATORY_CARE_PROVIDER_SITE_OTHER): Payer: 59 | Admitting: Family Medicine

## 2020-02-14 DIAGNOSIS — J111 Influenza due to unidentified influenza virus with other respiratory manifestations: Secondary | ICD-10-CM

## 2020-02-14 HISTORY — DX: Influenza due to unidentified influenza virus with other respiratory manifestations: J11.1

## 2020-02-14 NOTE — Progress Notes (Signed)
Symptoms: nausea, slight cough (post COVID)  Body aches and fever resolved on Wednesday.

## 2020-02-14 NOTE — Assessment & Plan Note (Signed)
Flu vs other viral illness. Had COVID about 3 weeks ago. Symptoms improved at this point Note provided for time missed from work.  Contact clinic for development of new or worsening symptoms.

## 2020-02-14 NOTE — Progress Notes (Signed)
Christine Grant - 45 y.o. female MRN [id number]  Date of birth: 07-Apr-1975   This visit type was conducted due to national recommendations for restrictions regarding the COVID-19 Pandemic (e.g. social distancing).  This format is felt to be most appropriate for this patient at this time.  All issues noted in this document were discussed and addressed.  No physical exam was performed (except for noted visual exam findings with Video Visits).  I discussed the limitations of evaluation and management by telemedicine and the availability of in person appointments. The patient expressed understanding and agreed to proceed.  I connected with@ on 02/14/20 at 10:30 AM EST by a video enabled telemedicine application and verified that I am speaking with the correct person using two identifiers.  Present at visit: Luetta Nutting, DO [place] Hene[place]   Patient Location: Home [place] [address]   Provider location:   [place]  No chief complaint on file.   HPI  Christine Grant is a 45 y.o. female who presents via audio/video conferencing for a telehealth visit today.  Reports having illness that started last weekend.  Had fever,chills, body aches, nausea, vomiting, diarrhea.  Fever improved on Wednesday however had continued nausea, fatigue and achiness.  She is feeling better today, only with some mild nausea.  She has been out of work this week due to illness.  She reports having COVID 3-4 weeks ago and had recovered from this.   ROS:  A comprehensive ROS was completed and negative except as noted per HPI  Past Medical History:  Diagnosis Date  . Abnormal uterine bleeding   . Anxiety   . Arthritis    knees  . Chronic pelvic pain in female   . Depression   . Endometriosis of pelvis   . Fibromyalgia   . History of ketoacidosis    01-15-2014  . PONV (postoperative nausea and vomiting)   . Sinus tachycardia   . Type 2 diabetes mellitus ([place])   . Wears contact lenses      Past Surgical History:  Procedure Laterality Date  . ABDOMINAL HYSTERECTOMY    . BREAST REDUCTION SURGERY  age 83  . CESAREAN SECTION  2002,  2005,  2008  . DILATION AND CURETTAGE OF UTERUS N/A 02/13/2014   Procedure: DILATATION AND CURETTAGE;  Surgeon: Everitt Amber, MD;  Location: [facility];  Service: Gynecology;  Laterality: N/A;  . LEFT OOPHORECTOMY  2019  . MANDIBLE SURGERY  age 64   Correct overbite  . REDUCTION MAMMAPLASTY      Family History  Adopted: Yes  Problem Relation Age of Onset  . Hyperlipidemia Mother   . Hypertension Mother   . Diabetes Mother   . Breast cancer Maternal Aunt   . Endometriosis Maternal Aunt     Social History   Socioeconomic History  . Marital status: Married    Spouse name: Not on file  . Number of children: Not on file  . Years of education: Not on file  . Highest education level: Not on file  Occupational History  . Not on file  Tobacco Use  . Smoking status: Never Smoker  . Smokeless tobacco: Never Used  Vaping Use  . Vaping Use: Never used  Substance and Sexual Activity  . Alcohol use: No  . Drug use: No  . Sexual activity: Yes    Birth control/protection: Surgical  Other Topics Concern  . Not on file  Social History Narrative  . Not on file  Social Determinants of Health   Financial Resource Strain: Not on file  Food Insecurity: Not on file  Transportation Needs: Not on file  Physical Activity: Not on file  Stress: Not on file  Social Connections: Not on file  Intimate Partner Violence: Not on file     Current Outpatient Medications:  .  acetaminophen (TYLENOL) 500 MG tablet, Take 500 mg by mouth every 6 (six) hours as needed., Disp: , Rfl:  .  ALPRAZolam (XANAX) 1 MG tablet, TAKE 1 TABLET (1 MG TOTAL) BY MOUTH 2 (TWO) TIMES DAILY AS NEEDED., Disp: 60 tablet, Rfl: 2 .  cyclobenzaprine (FLEXERIL) 10 MG tablet, TAKE 1 TABLET BY MOUTH THREE TIMES A DAY AS NEEDED FOR MUSCLE SPASMS, Disp: 270 tablet,  Rfl: 1 .  dapagliflozin propanediol (FARXIGA) 10 MG TABS tablet, Take 1 tablet (10 mg total) by mouth daily., Disp: 90 tablet, Rfl: 3 .  Dulaglutide (TRULICITY) 4.09 WJ/1.9JY SOPN, Inject 0.75 mg into the skin once a week., Disp: 5 mL, Rfl: 11 .  escitalopram (LEXAPRO) 10 MG tablet, Take 10 mg by mouth daily., Disp: , Rfl:  .  gabapentin (NEURONTIN) 300 MG capsule, TAKE 3 CAPSULES BY MOUTH 3 TIMES DAILY., Disp: 810 capsule, Rfl: 0 .  insulin detemir (LEVEMIR FLEXTOUCH) 100 UNIT/ML FlexPen, Inject 26 Units into the skin daily., Disp: 30 mL, Rfl: 11 .  Insulin Pen Needle (ADVOCATE INSULIN PEN NEEDLES) 33G X 4 MM MISC, 1 each by Does not apply route daily., Disp: 100 each, Rfl: 12 .  metoprolol succinate (TOPROL-XL) 100 MG 24 hr tablet, Take 1 tablet (100 mg total) by mouth daily. Appt/lab for further refill, Disp: 90 tablet, Rfl: 0 .  nystatin-triamcinolone ointment (MYCOLOG), APPLY TO AFFECTED AREA TWICE A DAY, Disp: , Rfl:  .  promethazine (PHENERGAN) 25 MG tablet, Take 1 tablet (25 mg total) by mouth every 6 (six) hours as needed for nausea or vomiting., Disp: 30 tablet, Rfl: 2 .  QUEtiapine (SEROQUEL) 50 MG tablet, Take by mouth., Disp: , Rfl:  .  traMADol (ULTRAM) 50 MG tablet, Take 50 mg by mouth 3 (three) times daily as needed., Disp: , Rfl:   EXAM:  VITALS per patient if applicable: Wt 257 lb (116.6 kg)   LMP  (LMP Unknown)   BMI 41.48 kg/m   GENERAL: alert, oriented, appears well and in no acute distress  HEENT: atraumatic, conjunttiva clear, no obvious abnormalities on inspection of external nose and ears  NECK: normal movements of the head and neck  LUNGS: on inspection no signs of respiratory distress, breathing rate appears normal, no obvious gross SOB, gasping or wheezing  CV: no obvious cyanosis  MS: moves all visible extremities without noticeable abnormality  PSYCH/NEURO: pleasant and cooperative, no obvious depression or anxiety, speech and thought processing grossly  intact  ASSESSMENT AND PLAN:  Discussed the following assessment and plan:  Influenza-like illness Flu vs other viral illness. Had COVID about 3 weeks ago. Symptoms improved at this point Note provided for time missed from work.  Contact clinic for development of new or worsening symptoms.      I discussed the assessment and treatment plan with the patient. The patient was provided an opportunity to ask questions and all were answered. The patient agreed with the plan and demonstrated an understanding of the instructions.   The patient was advised to call back or seek an in-person evaluation if the symptoms worsen or if the condition fails to improve as anticipated.    Einar Pheasant  Zigmund Daniel, DO

## 2020-02-17 ENCOUNTER — Other Ambulatory Visit: Payer: Self-pay | Admitting: Osteopathic Medicine

## 2020-02-17 DIAGNOSIS — G43709 Chronic migraine without aura, not intractable, without status migrainosus: Secondary | ICD-10-CM

## 2020-02-17 NOTE — Telephone Encounter (Signed)
Per [facility] [employer] -   PATIENT'S NEW INS WILL ONLY COVER A TOTAL OF #90 CAPSULES OF GABAPENTIN IN 30 DAYS. MAY ACCEPT A PA OR DOSE CHANGE.   Pls advise. Thanks.

## 2020-02-17 NOTE — Telephone Encounter (Signed)
As far as I can tell, dosage per capsule only goes up to 800 mg which would be a reduction in dose.  Please call patient and see if she is okay with me sending the lower dose, which insurance should cover.  Best alternative would be to continue with 600 mg pills #90 which insurance should cover and she will have to pay out-of-pocket for the additional 300 mg to take with it

## 2020-02-19 NOTE — Telephone Encounter (Signed)
Left a detailed vm msg for pt regarding gabapentin medication. Pt informed to return a call back to the clinic. Direct call back info provided.

## 2020-02-26 ENCOUNTER — Telehealth: Payer: Self-pay | Admitting: Osteopathic Medicine

## 2020-02-26 NOTE — Telephone Encounter (Signed)
Patient wanting to know if they can switch providers. Had a visit with Dr.Matthews and said it went pleasantly well. Also husband sees Dr.Matthews. Please advise.

## 2020-02-26 NOTE — Telephone Encounter (Signed)
Fine with me if fine w/ Dr Zigmund Daniel

## 2020-02-26 NOTE — Telephone Encounter (Signed)
It's ok with me.   Thanks!

## 2020-02-27 NOTE — Telephone Encounter (Signed)
Patient has been made aware and appointment has been. No further questions at this time.

## 2020-03-02 ENCOUNTER — Telehealth (INDEPENDENT_AMBULATORY_CARE_PROVIDER_SITE_OTHER): Payer: 59 | Admitting: Family Medicine

## 2020-03-02 VITALS — HR 83 | Wt 252.0 lb

## 2020-03-02 DIAGNOSIS — F411 Generalized anxiety disorder: Secondary | ICD-10-CM | POA: Diagnosis not present

## 2020-03-02 DIAGNOSIS — E669 Obesity, unspecified: Secondary | ICD-10-CM | POA: Diagnosis not present

## 2020-03-02 DIAGNOSIS — N83299 Other ovarian cyst, unspecified side: Secondary | ICD-10-CM | POA: Diagnosis not present

## 2020-03-02 DIAGNOSIS — E1169 Type 2 diabetes mellitus with other specified complication: Secondary | ICD-10-CM | POA: Diagnosis not present

## 2020-03-02 MED ORDER — QUETIAPINE FUMARATE 50 MG PO TABS: 50.0000 mg | ORAL_TABLET | Freq: Every day | ORAL | 2 refills | Status: DC

## 2020-03-02 MED ORDER — ALPRAZOLAM ER 1 MG PO TB24: 1.0000 mg | ORAL_TABLET | Freq: Every day | ORAL | 1 refills | Status: DC

## 2020-03-03 ENCOUNTER — Encounter: Payer: Self-pay | Admitting: Family Medicine

## 2020-03-03 DIAGNOSIS — N83299 Other ovarian cyst, unspecified side: Secondary | ICD-10-CM

## 2020-03-03 HISTORY — DX: Other ovarian cyst, unspecified side: N83.299

## 2020-03-03 NOTE — Assessment & Plan Note (Signed)
She only recently restarted on her medications.  Will plan to update labs in 2-3 months.

## 2020-03-03 NOTE — Progress Notes (Signed)
Christine Grant - 45 y.o. female MRN [id number]  Date of birth: September 08, 1975   This visit type was conducted due to national recommendations for restrictions regarding the COVID-19 Pandemic (e.g. social distancing).  This format is felt to be most appropriate for this patient at this time.  All issues noted in this document were discussed and addressed.  No physical exam was performed (except for noted visual exam findings with Video Visits).  I discussed the limitations of evaluation and management by telemedicine and the availability of in person appointments. The patient expressed understanding and agreed to proceed.  I connected with@ on 03/03/20 at 11:10 AM EST by a video enabled telemedicine application and verified that I am speaking with the correct person using two identifiers.  Present at visit: Luetta Nutting, DO [place]   Patient Location: [place] [address]   Provider location:   [facility]  Chief Complaint  Patient presents with  . Ovarian Cyst    HPI  Christine Grant is a 45 y.o. female who presents via audio/video conferencing for a telehealth visit today.  She was seen in the ED on 02/24/20 for UTI symptoms.  Given injection of rocephin. Pelvic US also showed complex ovarian cyst and recommended that she have this followed up in 4-6 weeks.    She also has had increased anxiety with difficulty sleeping. She has not been taking seroquel regularly, would like to restart.  She is taking lexapro 10mg  daily and alprazolam.  She continues to have quite a bit of breakthrough anxiety throughout the day though.   She has been off most of her diabetes medications for several months as well due to her mental health.  She did recently restart these and has started to work on dietary changes.      ROS:  A comprehensive ROS was completed and negative except as noted per HPI  Past Medical History:  Diagnosis Date  . Abnormal uterine bleeding   . Anxiety    . Arthritis    knees  . Chronic pelvic pain in female   . Depression   . Endometriosis of pelvis   . Fibromyalgia   . History of ketoacidosis    01-15-2014  . PONV (postoperative nausea and vomiting)   . Sinus tachycardia   . Type 2 diabetes mellitus ([place])   . Wears contact lenses     Past Surgical History:  Procedure Laterality Date  . ABDOMINAL HYSTERECTOMY    . BREAST REDUCTION SURGERY  age 78  . CESAREAN SECTION  2002,  2005,  2008  . DILATION AND CURETTAGE OF UTERUS N/A 02/13/2014   Procedure: DILATATION AND CURETTAGE;  Surgeon: Everitt Amber, MD;  Location: [facility];  Service: Gynecology;  Laterality: N/A;  . LEFT OOPHORECTOMY  2019  . MANDIBLE SURGERY  age 59   Correct overbite  . REDUCTION MAMMAPLASTY      Family History  Adopted: Yes  Problem Relation Age of Onset  . Hyperlipidemia Mother   . Hypertension Mother   . Diabetes Mother   . Breast cancer Maternal Aunt   . Endometriosis Maternal Aunt     Social History   Socioeconomic History  . Marital status: Married    Spouse name: Not on file  . Number of children: Not on file  . Years of education: Not on file  . Highest education level: Not on file  Occupational History  . Not on file  Tobacco Use  . Smoking status:  Never Smoker  . Smokeless tobacco: Never Used  Vaping Use  . Vaping Use: Never used  Substance and Sexual Activity  . Alcohol use: No  . Drug use: No  . Sexual activity: Yes    Birth control/protection: Surgical  Other Topics Concern  . Not on file  Social History Narrative  . Not on file   Social Determinants of Health   Financial Resource Strain: Not on file  Food Insecurity: Not on file  Transportation Needs: Not on file  Physical Activity: Not on file  Stress: Not on file  Social Connections: Not on file  Intimate Partner Violence: Not on file     Current Outpatient Medications:  .  acetaminophen (TYLENOL) 500 MG tablet, Take 500 mg by mouth every 6  (six) hours as needed., Disp: , Rfl:  .  ALPRAZolam (XANAX XR) 1 MG 24 hr tablet, Take 1 tablet (1 mg total) by mouth daily., Disp: 90 tablet, Rfl: 1 .  cyclobenzaprine (FLEXERIL) 10 MG tablet, TAKE 1 TABLET BY MOUTH THREE TIMES A DAY AS NEEDED FOR MUSCLE SPASMS, Disp: 270 tablet, Rfl: 1 .  dapagliflozin propanediol (FARXIGA) 10 MG TABS tablet, Take 1 tablet (10 mg total) by mouth daily., Disp: 90 tablet, Rfl: 3 .  Dulaglutide (TRULICITY) 8.54 OE/7.0JJ SOPN, Inject 0.75 mg into the skin once a week., Disp: 5 mL, Rfl: 11 .  escitalopram (LEXAPRO) 10 MG tablet, Take 10 mg by mouth daily., Disp: , Rfl:  .  gabapentin (NEURONTIN) 300 MG capsule, TAKE 3 CAPSULES BY MOUTH 3 TIMES DAILY., Disp: 810 capsule, Rfl: 0 .  promethazine (PHENERGAN) 25 MG tablet, Take 1 tablet (25 mg total) by mouth every 6 (six) hours as needed for nausea or vomiting., Disp: 30 tablet, Rfl: 2 .  traMADol (ULTRAM) 50 MG tablet, Take 1 tablet (50 mg total) by mouth every 6 (six) hours as needed for moderate pain or severe pain., Disp: 30 tablet, Rfl: 2 .  insulin detemir (LEVEMIR FLEXTOUCH) 100 UNIT/ML FlexPen, Inject 26 Units into the skin daily. (Patient not taking: Reported on 03/02/2020), Disp: 30 mL, Rfl: 11 .  Insulin Pen Needle (ADVOCATE INSULIN PEN NEEDLES) 33G X 4 MM MISC, 1 each by Does not apply route daily. (Patient not taking: Reported on 03/02/2020), Disp: 100 each, Rfl: 12 .  metoprolol succinate (TOPROL-XL) 100 MG 24 hr tablet, Take 1 tablet (100 mg total) by mouth daily. Appt/lab for further refill (Patient not taking: Reported on 03/02/2020), Disp: 90 tablet, Rfl: 0 .  QUEtiapine (SEROQUEL) 50 MG tablet, Take 1 tablet (50 mg total) by mouth at bedtime., Disp: 90 tablet, Rfl: 2  EXAM:  VITALS per patient if applicable: Pulse 83   Wt 252 lb (114.3 kg)   LMP  (LMP Unknown)   BMI 40.67 kg/m   GENERAL: alert, oriented, appears well and in no acute distress  HEENT: atraumatic, conjunttiva clear, no obvious  abnormalities on inspection of external nose and ears  NECK: normal movements of the head and neck  LUNGS: on inspection no signs of respiratory distress, breathing rate appears normal, no obvious gross SOB, gasping or wheezing  CV: no obvious cyanosis  MS: moves all visible extremities without noticeable abnormality  PSYCH/NEURO: pleasant and cooperative, no obvious depression or anxiety, speech and thought processing grossly intact  ASSESSMENT AND PLAN:  Discussed the following assessment and plan:  Complex ovarian cyst Pain improved since recent ED visit.  F/u US to be completed in 4-6 weeks ordered.   Diabetes  mellitus type 2 in obese She only recently restarted on her medications.  Will plan to update labs in 2-3 months.   GAD (generalized anxiety disorder) Continue lexapro.  We'll try switching alprazolam to extended release to see itf this provides better coverage of her daytime anxiety.  Seroquel refilled to use at bedtime.       I discussed the assessment and treatment plan with the patient. The patient was provided an opportunity to ask questions and all were answered. The patient agreed with the plan and demonstrated an understanding of the instructions.   The patient was advised to call back or seek an in-person evaluation if the symptoms worsen or if the condition fails to improve as anticipated.    Luetta Nutting, DO

## 2020-03-03 NOTE — Assessment & Plan Note (Signed)
Pain improved since recent ED visit.  F/u US to be completed in 4-6 weeks ordered.

## 2020-03-03 NOTE — Assessment & Plan Note (Signed)
Continue lexapro.  We'll try switching alprazolam to extended release to see itf this provides better coverage of her daytime anxiety.  Seroquel refilled to use at bedtime.

## 2020-03-05 ENCOUNTER — Encounter: Payer: Self-pay | Admitting: Family Medicine

## 2020-03-06 ENCOUNTER — Ambulatory Visit (INDEPENDENT_AMBULATORY_CARE_PROVIDER_SITE_OTHER): Payer: 59

## 2020-03-06 ENCOUNTER — Other Ambulatory Visit: Payer: Self-pay

## 2020-03-06 ENCOUNTER — Encounter: Payer: Self-pay | Admitting: Family Medicine

## 2020-03-06 DIAGNOSIS — N83291 Other ovarian cyst, right side: Secondary | ICD-10-CM | POA: Diagnosis not present

## 2020-03-06 DIAGNOSIS — N83299 Other ovarian cyst, unspecified side: Secondary | ICD-10-CM

## 2020-03-10 NOTE — Telephone Encounter (Signed)
Was not able to leave a voicemail for patient. Did not answer and no voicemail set up.

## 2020-04-01 ENCOUNTER — Other Ambulatory Visit: Payer: Self-pay | Admitting: Family Medicine

## 2020-04-01 ENCOUNTER — Encounter: Payer: Self-pay | Admitting: Family Medicine

## 2020-04-01 MED ORDER — GABAPENTIN 600 MG PO TABS: 1200.0000 mg | ORAL_TABLET | Freq: Three times a day (TID) | ORAL | 2 refills | Status: DC

## 2020-04-02 ENCOUNTER — Ambulatory Visit: Payer: 59 | Admitting: Family Medicine

## 2020-04-16 ENCOUNTER — Encounter: Payer: Self-pay | Admitting: Family Medicine

## 2020-04-20 ENCOUNTER — Encounter: Payer: Self-pay | Admitting: Family Medicine

## 2020-05-04 ENCOUNTER — Telehealth: Payer: Self-pay | Admitting: General Practice

## 2020-05-04 NOTE — Telephone Encounter (Signed)
Transition Care Management Unsuccessful Follow-up Telephone Call  Date of discharge and from where:  [facility] 05/03/20  Attempts:  1st Attempt  Reason for unsuccessful TCM follow-up call:  Left voice message

## 2020-05-05 ENCOUNTER — Encounter: Payer: Self-pay | Admitting: Family Medicine

## 2020-05-05 ENCOUNTER — Other Ambulatory Visit: Payer: Self-pay | Admitting: Family Medicine

## 2020-05-05 DIAGNOSIS — G43709 Chronic migraine without aura, not intractable, without status migrainosus: Secondary | ICD-10-CM

## 2020-05-05 MED ORDER — TRAMADOL HCL 50 MG PO TABS: 50.0000 mg | ORAL_TABLET | Freq: Four times a day (QID) | ORAL | 1 refills | Status: AC | PRN

## 2020-05-05 NOTE — Telephone Encounter (Signed)
Transition Care Management Follow-up Telephone Call  Date of discharge and from where: 05/03/1009 from [facility]  How have you been since you were released from the hospital? Pt is feeling some better and has no questions or concerns. Pt is follow up with PCP next week.   Any questions or concerns? No  Items Reviewed:  Did the pt receive and understand the discharge instructions provided? Yes   Medications obtained and verified? Yes   Other? No   Any new allergies since your discharge? No   Dietary orders reviewed? n/a  Do you have support at home? Yes   Functional Questionnaire: (I = Independent and D = Dependent) ADLs: I  Bathing/Dressing- I  Meal Prep- I  Eating- I  Maintaining continence- I  Transferring/Ambulation- I  Managing Meds- I   Follow up appointments reviewed:   PCP Hospital f/u appt confirmed? Yes  PCP appt on Tuesday May 3rd.   [facility] f/u appt confirmed? No    Are transportation arrangements needed? No    If their condition worsens, is the pt aware to call PCP or go to the Emergency Dept.? Yes  Was the patient provided with contact information for the PCP's office or ED? Yes  Was to pt encouraged to call back with questions or concerns? Yes

## 2020-05-12 ENCOUNTER — Other Ambulatory Visit: Payer: Self-pay

## 2020-05-12 ENCOUNTER — Ambulatory Visit: Payer: 59 | Admitting: Family Medicine

## 2020-05-12 ENCOUNTER — Encounter: Payer: Self-pay | Admitting: Family Medicine

## 2020-05-12 VITALS — BP 143/99 | HR 120 | Temp 97.9°F | Resp 20 | Ht 66.0 in | Wt 258.0 lb

## 2020-05-12 DIAGNOSIS — E1159 Type 2 diabetes mellitus with other circulatory complications: Secondary | ICD-10-CM

## 2020-05-12 DIAGNOSIS — F411 Generalized anxiety disorder: Secondary | ICD-10-CM | POA: Diagnosis not present

## 2020-05-12 DIAGNOSIS — I152 Hypertension secondary to endocrine disorders: Secondary | ICD-10-CM

## 2020-05-12 DIAGNOSIS — E1169 Type 2 diabetes mellitus with other specified complication: Secondary | ICD-10-CM | POA: Diagnosis not present

## 2020-05-12 DIAGNOSIS — E669 Obesity, unspecified: Secondary | ICD-10-CM

## 2020-05-12 LAB — POCT GLYCOSYLATED HEMOGLOBIN (HGB A1C): Hemoglobin A1C: 11.7 % — AB (ref 4.0–5.6)

## 2020-05-12 MED ORDER — QUETIAPINE FUMARATE 50 MG PO TABS: 50.0000 mg | ORAL_TABLET | Freq: Every day | ORAL | 2 refills | Status: DC

## 2020-05-12 MED ORDER — TRULICITY 1.5 MG/0.5ML ~~LOC~~ SOAJ: 1.5000 mg | SUBCUTANEOUS | 1 refills | Status: DC

## 2020-05-12 MED ORDER — ALPRAZOLAM 1 MG PO TABS: ORAL_TABLET | ORAL | 2 refills | Status: DC

## 2020-05-12 NOTE — Assessment & Plan Note (Signed)
BP elevated today.  We'll see if getting her anxiety under better control helps with BP.   If this remains elevated would recommend addition of ARB with history of DM.

## 2020-05-12 NOTE — Patient Instructions (Addendum)
Lab Results  Component Value Date   HGBA1C 11.7 (A) 71/16/5790    Increase trulicity to 1.5mg  weekly.  Work on dietary changes to help with blood sugar control.  You may use 1-2 seroquel as needed at bedtime.

## 2020-05-12 NOTE — Assessment & Plan Note (Signed)
Increased anxiety in the afternoon.  Will add additional dose of alprazolam IR on in the afternoon as needed.  Continue lexapro 10mg  daily.  She may use seroquel 50-100mg  at bedtime as needed.

## 2020-05-12 NOTE — Assessment & Plan Note (Signed)
Most recent A1c of  Lab Results  Component Value Date   HGBA1C 11.7 (A) 05/12/2020   indicates diabetes is not well controlled.  she will increase trulicity to 1.5mg  weekly and continue farxiga.    Counseled on healthy, low carb diet and recommend frequent activity to help with maintaining good control of blood sugars.  If not improving with this we discussed adding insulin back on.

## 2020-05-12 NOTE — Progress Notes (Signed)
Christine Grant - 45 y.o. female MRN [id number]  Date of birth: 12/14/1975  Subjective Chief Complaint  Patient presents with  . Anxiety    HPI Christine Grant is a 45 y.o. female here today for follow up of T2DM, HTN and anxiety.   Diabetes is currently treated with farxiga and trulicity 0.75mg /week.  She is taking medications as directed.  Has not been prudent with her diet.  She does walk some for activity but no significant aerobic activity.  No longer taking insulin.  Blood sugars elevated at home.   Anxiety treated with combination of lexapro 10mg  daily and alprazolam xr 1mg  daily.  She feels that this wears off in the late afternoon/early evening and she has some increased anxiety and irritability.  She continues on seroquel at bedtime.  50mg  is useful most nights but sometimes has increased difficulty with sleep.    BP elevated today. No longer taking toprol. Denies symptoms related to HTN including chest pain, shortness of breath, palpitations or vision changes.   ROS:  A comprehensive ROS was completed and negative except as noted per HPI  Allergies  Allergen Reactions  . Toradol [Ketorolac Tromethamine] Other (See Comments)    Chest tightness, flushed     Past Medical History:  Diagnosis Date  . Abnormal uterine bleeding   . Anxiety   . Arthritis    knees  . Chronic pelvic pain in female   . Depression   . Endometriosis of pelvis   . Fibromyalgia   . History of ketoacidosis    01-15-2014  . PONV (postoperative nausea and vomiting)   . Sinus tachycardia   . Type 2 diabetes mellitus ([place])   . Wears contact lenses     Past Surgical History:  Procedure Laterality Date  . ABDOMINAL HYSTERECTOMY    . BREAST REDUCTION SURGERY  age 71  . CESAREAN SECTION  2002,  2005,  2008  . DILATION AND CURETTAGE OF UTERUS N/A 02/13/2014   Procedure: DILATATION AND CURETTAGE;  Surgeon: Everitt Amber, MD;  Location: [facility];  Service: Gynecology;  Laterality: N/A;  .  LEFT OOPHORECTOMY  2019  . MANDIBLE SURGERY  age 14   Correct overbite  . REDUCTION MAMMAPLASTY      Social History   Socioeconomic History  . Marital status: Married    Spouse name: Not on file  . Number of children: Not on file  . Years of education: Not on file  . Highest education level: Not on file  Occupational History  . Not on file  Tobacco Use  . Smoking status: Never Smoker  . Smokeless tobacco: Never Used  Vaping Use  . Vaping Use: Never used  Substance and Sexual Activity  . Alcohol use: No  . Drug use: No  . Sexual activity: Yes    Birth control/protection: Surgical  Other Topics Concern  . Not on file  Social History Narrative  . Not on file   Social Determinants of Health   Financial Resource Strain: Not on file  Food Insecurity: Not on file  Transportation Needs: Not on file  Physical Activity: Not on file  Stress: Not on file  Social Connections: Not on file    Family History  Adopted: Yes  Problem Relation Age of Onset  . Hyperlipidemia Mother   . Hypertension Mother   . Diabetes Mother   . Breast cancer Maternal Aunt   . Endometriosis Maternal Aunt     Health Maintenance  Topic Date Due  .  Hepatitis C Screening  Never done  . OPHTHALMOLOGY EXAM  05/12/2020 (Originally 08/02/1986)  . HIV Screening  05/19/2020 (Originally 08/02/1991)  . COVID-19 Vaccine (3 - Booster for Moderna series) 05/28/2020 (Originally 09/13/2019)  . URINE MICROALBUMIN  06/12/2020 (Originally 12/23/2015)  . FOOT EXAM  05/12/2021 (Originally 06/25/2019)  . HEMOGLOBIN A1C  11/12/2020  . TETANUS/TDAP  01/25/2023  . HPV VACCINES  Aged Out     ----------------------------------------------------------------------------------------------------------------------------------------------------------------------------------------------------------------- Physical Exam BP (!) 143/99 (BP Location: Left Arm, Patient Position: Sitting, Cuff Size: Large)   Pulse (!) 120   Temp  97.9 F (36.6 C) (Oral)   Resp 20   Ht 5\' 6"  (1.676 m)   Wt 258 lb (117 kg)   LMP  (LMP Unknown)   SpO2 99%   BMI 41.64 kg/m   Physical Exam Constitutional:      Appearance: Normal appearance.  HENT:     Head: Normocephalic and atraumatic.  Eyes:     General: No scleral icterus. Musculoskeletal:     Cervical back: Neck supple.  Neurological:     General: No focal deficit present.     Mental Status: She is alert.  Psychiatric:        Mood and Affect: Mood normal.        Behavior: Behavior normal.     ------------------------------------------------------------------------------------------------------------------------------------------------------------------------------------------------------------------- Assessment and Plan  Hypertension associated with diabetes (HCC) BP elevated today.  We'll see if getting her anxiety under better control helps with BP.   If this remains elevated would recommend addition of ARB with history of DM.    Diabetes mellitus type 2 in obese Most recent A1c of  Lab Results  Component Value Date   HGBA1C 11.7 (A) 05/12/2020   indicates diabetes is not well controlled.  she will increase trulicity to 1.5mg  weekly and continue farxiga.    Counseled on healthy, low carb diet and recommend frequent activity to help with maintaining good control of blood sugars.  If not improving with this we discussed adding insulin back on.    GAD (generalized anxiety disorder) Increased anxiety in the afternoon.  Will add additional dose of alprazolam IR on in the afternoon as needed.  Continue lexapro 10mg  daily.  She may use seroquel 50-100mg  at bedtime as needed.     Meds ordered this encounter  Medications  . Dulaglutide (TRULICITY) 1.5 AO/1.3YQ SOPN    Sig: Inject 1.5 mg into the skin once a week.    Dispense:  6 mL    Refill:  1  . QUEtiapine (SEROQUEL) 50 MG tablet    Sig: Take 1-2 tablets (50-100 mg total) by mouth at bedtime.    Dispense:  180  tablet    Refill:  2  . ALPRAZolam (XANAX) 1 MG tablet    Sig: Take 1 tablet in the afternoon as needed for anxiety.    Dispense:  30 tablet    Refill:  2    Not to exceed 5 additional fills before 08/22/2015.    Return in about 3 months (around 08/12/2020) for T2DM/Mood.    This visit occurred during the SARS-CoV-2 public health emergency.  Safety protocols were in place, including screening questions prior to the visit, additional usage of staff PPE, and extensive cleaning of exam room while observing appropriate contact time as indicated for disinfecting solutions.

## 2020-05-14 ENCOUNTER — Ambulatory Visit: Payer: 59 | Admitting: Family Medicine

## 2020-05-20 ENCOUNTER — Telehealth: Payer: Self-pay | Admitting: General Practice

## 2020-05-20 NOTE — Telephone Encounter (Signed)
Transition Care Management Unsuccessful Follow-up Telephone Call  Date of discharge and from where:  [facility] 05/19/20  Attempts:  1st Attempt  Reason for unsuccessful TCM follow-up call:  Left voice message

## 2020-05-22 ENCOUNTER — Other Ambulatory Visit: Payer: Self-pay | Admitting: Family Medicine

## 2020-05-22 ENCOUNTER — Encounter: Payer: Self-pay | Admitting: Family Medicine

## 2020-05-22 MED ORDER — ONDANSETRON 4 MG PO TBDP: 4.0000 mg | ORAL_TABLET | Freq: Three times a day (TID) | ORAL | 0 refills | Status: DC | PRN

## 2020-05-22 NOTE — Telephone Encounter (Signed)
Transition Care Management Follow-up Telephone Call  Date of discharge and from where: [facility] 05/19/20  How have you been since you were released from the hospital? Still nauseous.  Any questions or concerns? No  Items Reviewed:  Did the pt receive and understand the discharge instructions provided? Yes   Medications obtained and verified? Yes   Other? No   Any new allergies since your discharge? No   Dietary orders reviewed? Yes  Do you have support at home? Yes   Home Care and Equipment/Supplies: Were home health services ordered? no  Functional Questionnaire: (I = Independent and D = Dependent) ADLs: I  Bathing/Dressing- I  Meal Prep- I  Eating- I  Maintaining continence- I  Transferring/Ambulation- I  Managing Meds- I  Follow up appointments reviewed:   PCP Hospital f/u appt confirmed? No  Patient stated that she will look at her schedule and call back.  [facility] f/u appt confirmed? No  Patient was referred to PCP at this time.   Are transportation arrangements needed? No   If their condition worsens, is the pt aware to call PCP or go to the Emergency Dept.? Yes  Was the patient provided with contact information for the PCP's office or ED? Yes  Was to pt encouraged to call back with questions or concerns? Yes

## 2020-05-26 ENCOUNTER — Other Ambulatory Visit: Payer: Self-pay | Admitting: Family Medicine

## 2020-05-26 ENCOUNTER — Encounter: Payer: Self-pay | Admitting: Family Medicine

## 2020-05-26 MED ORDER — DAPAGLIFLOZIN PROPANEDIOL 10 MG PO TABS: 10.0000 mg | ORAL_TABLET | Freq: Every day | ORAL | 3 refills | Status: DC

## 2020-05-26 NOTE — Telephone Encounter (Signed)
Rx sent in

## 2020-06-02 ENCOUNTER — Ambulatory Visit: Payer: 59 | Admitting: Family Medicine

## 2020-06-03 ENCOUNTER — Encounter: Payer: Self-pay | Admitting: Family Medicine

## 2020-06-03 ENCOUNTER — Telehealth: Payer: Self-pay | Admitting: General Practice

## 2020-06-03 NOTE — Telephone Encounter (Signed)
Transition Care Management Unsuccessful Follow-up Telephone Call  Date of discharge and from where:  [facility] 06/02/20  Attempts:  1st Attempt  Reason for unsuccessful TCM follow-up call:  Left voice message

## 2020-06-04 MED ORDER — ONDANSETRON 4 MG PO TBDP: 4.0000 mg | ORAL_TABLET | Freq: Three times a day (TID) | ORAL | 0 refills | Status: DC | PRN

## 2020-06-04 NOTE — Telephone Encounter (Signed)
Refill pended

## 2020-06-05 NOTE — Telephone Encounter (Signed)
Transition Care Management Unsuccessful Follow-up Telephone Call  Date of discharge and from where:  06/02/20 from [facility]  Attempts:  2nd Attempt  Reason for unsuccessful TCM follow-up call:  Left voice message

## 2020-06-08 NOTE — Telephone Encounter (Signed)
Transition Care Management Unsuccessful Follow-up Telephone Call  Date of discharge and from where:  06/02/2020 from [facility]  Attempts:  3rd Attempt  Reason for unsuccessful TCM follow-up call:  Left voice message

## 2020-06-11 ENCOUNTER — Encounter: Payer: Self-pay | Admitting: Family Medicine

## 2020-06-11 ENCOUNTER — Telehealth (INDEPENDENT_AMBULATORY_CARE_PROVIDER_SITE_OTHER): Payer: 59 | Admitting: Family Medicine

## 2020-06-11 VITALS — HR 102 | Ht 66.0 in | Wt 260.0 lb

## 2020-06-11 DIAGNOSIS — N83299 Other ovarian cyst, unspecified side: Secondary | ICD-10-CM

## 2020-06-11 DIAGNOSIS — N3090 Cystitis, unspecified without hematuria: Secondary | ICD-10-CM | POA: Diagnosis not present

## 2020-06-11 NOTE — Progress Notes (Signed)
Christine Grant - 45 y.o. female MRN [id number]  Date of birth: [birth year]   This visit type was conducted due to national recommendations for restrictions regarding the COVID-19 Pandemic (e.g. social distancing).  This format is felt to be most appropriate for this patient at this time.  All issues noted in this document were discussed and addressed.  No physical exam was performed (except for noted visual exam findings with Video Visits).  I discussed the limitations of evaluation and management by telemedicine and the availability of in person appointments. The patient expressed understanding and agreed to proceed.  I connected with@ on 06/11/20 at  1:30 PM EDT by a video enabled telemedicine application and verified that I am speaking with the correct person using two identifiers.  Present at visit: Luetta Nutting, DO Person [place]   Patient Location: [place] [id number]   Provider location:   [place] Complaint  Patient presents with  . Referral    HPI  Christine Grant is a 45 y.o. female who presents via audio/video conferencing for a telehealth visit today.  She is following up for recent ED visit.  She was seen on 5/24 due to pelvic pain related to ovarian cyst.  She only have one ovary as she had her other removed due to torsion.  She was also diagnosed with UTI and started on omnicef.  She is continuing to have dysuria despite this.  She is also having some low back pain.  Pelvic pain has improved some, but due to recurrent nature and concern about torsion every time she has pain she would like referral to GYN.     ROS:  A comprehensive ROS was completed and negative except as noted per HPI  Past Medical History:  Diagnosis Date  . Abnormal uterine bleeding   . Anxiety   . Arthritis    knees  . Chronic pelvic pain in female   . Depression   . Endometriosis of pelvis   . Fibromyalgia   . History of ketoacidosis    01-15-2014  . PONV  (postoperative nausea and vomiting)   . Sinus tachycardia   . Type 2 diabetes mellitus ([place])   . Wears contact lenses     Past Surgical History:  Procedure Laterality Date  . ABDOMINAL HYSTERECTOMY    . BREAST REDUCTION SURGERY  age 45  . CESAREAN SECTION  2002,  2005,  2008  . DILATION AND CURETTAGE OF UTERUS N/A 02/13/2014   Procedure: DILATATION AND CURETTAGE;  Surgeon: Everitt Amber, MD;  [facility]: [facility];  Service: Gynecology;  Laterality: N/A;  . LEFT OOPHORECTOMY  2019  . MANDIBLE SURGERY  age 84   Correct overbite  . REDUCTION MAMMAPLASTY      Family History  Adopted: Yes  Problem Relation Age of Onset  . Hyperlipidemia Mother   . Hypertension Mother   . Diabetes Mother   . Breast cancer Maternal Aunt   . Endometriosis Maternal Aunt     Social History   Socioeconomic History  . Marital status: Married    Spouse name: Not on file  . Number of children: Not on file  . Years of education: Not on file  . Highest education level: Not on file  Occupational History  . Not on file  Tobacco Use  . Smoking status: Never Smoker  . Smokeless tobacco: Never Used  Vaping Use  . Vaping Use: Never used  Substance and Sexual Activity  . Alcohol  use: No  . Drug use: No  . Sexual activity: Yes    Birth control/protection: Surgical  Other Topics Concern  . Not on file  Social History Narrative  . Not on file   Social Determinants of Health   Financial Resource Strain: Not on file  Food Insecurity: Not on file  Transportation Needs: Not on file  Physical Activity: Not on file  Stress: Not on file  Social Connections: Not on file  Intimate Partner Violence: Not on file     Current Outpatient Medications:  .  acetaminophen (TYLENOL) 500 MG tablet, Take 500 mg by mouth every 6 (six) hours as needed., Disp: , Rfl:  .  ALPRAZolam (XANAX XR) 1 MG 24 hr tablet, Take 1 tablet (1 mg total) by mouth daily., Disp: 90 tablet, Rfl: 1 .  ALPRAZolam (XANAX) 1  MG tablet, Take 1 tablet in the afternoon as needed for anxiety., Disp: 30 tablet, Rfl: 2 .  cefdinir (OMNICEF) 300 MG capsule, Take 300 mg by mouth 2 (two) times daily., Disp: , Rfl:  .  cyclobenzaprine (FLEXERIL) 10 MG tablet, TAKE 1 TABLET BY MOUTH THREE TIMES A DAY AS NEEDED FOR MUSCLE SPASMS, Disp: 270 tablet, Rfl: 1 .  dapagliflozin propanediol (FARXIGA) 10 MG TABS tablet, Take 1 tablet (10 mg total) by mouth daily., Disp: 90 tablet, Rfl: 3 .  Dulaglutide (TRULICITY) 1.5 DU/2.0UR SOPN, Inject 1.5 mg into the skin once a week., Disp: 6 mL, Rfl: 1 .  escitalopram (LEXAPRO) 10 MG tablet, Take 10 mg by mouth daily., Disp: , Rfl:  .  gabapentin (NEURONTIN) 600 MG tablet, Take 2 tablets (1,200 mg total) by mouth 3 (three) times daily., Disp: 180 tablet, Rfl: 2 .  insulin detemir (LEVEMIR FLEXTOUCH) 100 UNIT/ML FlexPen, Inject 26 Units into the skin daily., Disp: 30 mL, Rfl: 11 .  Insulin Pen Needle (ADVOCATE INSULIN PEN NEEDLES) 33G X 4 MM MISC, 1 each by Does not apply route daily., Disp: 100 each, Rfl: 12 .  ondansetron (ZOFRAN ODT) 4 MG disintegrating tablet, Take 1 tablet (4 mg total) by mouth every 8 (eight) hours as needed for nausea or vomiting., Disp: 20 tablet, Rfl: 0 .  promethazine (PHENERGAN) 25 MG tablet, Take 1 tablet (25 mg total) by mouth every 6 (six) hours as needed for nausea or vomiting., Disp: 30 tablet, Rfl: 2 .  QUEtiapine (SEROQUEL) 50 MG tablet, Take 1-2 tablets (50-100 mg total) by mouth at bedtime., Disp: 180 tablet, Rfl: 2 .  metoprolol succinate (TOPROL-XL) 100 MG 24 hr tablet, Take 1 tablet (100 mg total) by mouth daily. Appt/lab for further refill (Patient not taking: Reported on 06/11/2020), Disp: 90 tablet, Rfl: 0 .  metoprolol succinate (TOPROL-XL) 25 MG 24 hr tablet, Take 1 tablet by mouth daily., Disp: , Rfl:   EXAM:  VITALS per patient if applicable: Pulse (!) 427   Ht 5\' 6"  (1.676 m)   Wt 260 lb (117.9 kg)   LMP  (LMP Unknown)   BMI 41.97 kg/m   GENERAL:  alert, oriented, appears well and in no acute distress  HEENT: atraumatic, conjunttiva clear, no obvious abnormalities on inspection of external nose and ears  NECK: normal movements of the head and neck  LUNGS: on inspection no signs of respiratory distress, breathing rate appears normal, no obvious gross SOB, gasping or wheezing  CV: no obvious cyanosis  MS: moves all visible extremities without noticeable abnormality  PSYCH/NEURO: pleasant and cooperative, no obvious depression or anxiety, speech and thought  processing grossly intact  ASSESSMENT AND PLAN:  Discussed the following assessment and plan:  Complex ovarian cyst Referral placed to GYN  Cystitis Symptoms haven't really improved with Omnicef.  She will stop by to leave urine sample to send for culture.       I discussed the assessment and treatment plan with the patient. The patient was provided an opportunity to ask questions and all were answered. The patient agreed with the plan and demonstrated an understanding of the instructions.   The patient was advised to call back or seek an in-person evaluation if the symptoms worsen or if the condition fails to improve as anticipated.    Luetta Nutting, DO

## 2020-06-11 NOTE — Assessment & Plan Note (Signed)
Referral placed to GYN 

## 2020-06-11 NOTE — Assessment & Plan Note (Signed)
Symptoms haven't really improved with Omnicef.  She will stop by to leave urine sample to send for culture.

## 2020-06-11 NOTE — Progress Notes (Signed)
Left ovary removed due to torsion.  Right ovary has cysts now.

## 2020-06-26 ENCOUNTER — Other Ambulatory Visit: Payer: Self-pay | Admitting: Family Medicine

## 2020-06-26 ENCOUNTER — Encounter: Payer: Self-pay | Admitting: Family Medicine

## 2020-06-26 MED ORDER — TRAMADOL HCL 50 MG PO TABS: 50.0000 mg | ORAL_TABLET | Freq: Every day | ORAL | 0 refills | Status: DC | PRN

## 2020-07-01 ENCOUNTER — Encounter: Payer: Self-pay | Admitting: Family Medicine

## 2020-07-02 NOTE — Telephone Encounter (Signed)
LVM for patient to call back to get appt scheduled. AM

## 2020-07-07 ENCOUNTER — Telehealth: Payer: Self-pay | Admitting: *Deleted

## 2020-07-07 NOTE — Telephone Encounter (Signed)
Left patient an urgent message to call and reschedule appointment on 07/09/2020 due to Dr. Elly Modena being out of work that day.

## 2020-07-08 ENCOUNTER — Encounter: Payer: Self-pay | Admitting: Family Medicine

## 2020-07-08 ENCOUNTER — Telehealth (INDEPENDENT_AMBULATORY_CARE_PROVIDER_SITE_OTHER): Payer: 59 | Admitting: Family Medicine

## 2020-07-08 VITALS — Temp 97.9°F | Ht 66.0 in | Wt 260.0 lb

## 2020-07-08 DIAGNOSIS — E1169 Type 2 diabetes mellitus with other specified complication: Secondary | ICD-10-CM

## 2020-07-08 DIAGNOSIS — E669 Obesity, unspecified: Secondary | ICD-10-CM

## 2020-07-08 MED ORDER — FREESTYLE LIBRE 2 READER DEVI: 1.0000 | Freq: Once | 0 refills | Status: AC

## 2020-07-08 MED ORDER — FREESTYLE LIBRE 2 SENSOR MISC: 3 refills | Status: DC

## 2020-07-08 NOTE — Progress Notes (Signed)
Christine Grant - 45 y.o. female MRN [id number]  Date of birth: 05-14-1975   This visit type was conducted due to national recommendations for restrictions regarding the COVID-19 Pandemic (e.g. social distancing).  This format is felt to be most appropriate for this patient at this time.  All issues noted in this document were discussed and addressed.  No physical exam was performed (except for noted visual exam findings with Video Visits).  I discussed the limitations of evaluation and management by telemedicine and the availability of in person appointments. The patient expressed understanding and agreed to proceed.  I connected withNAME@ on 07/08/20 at  2:10 PM EDT by a video enabled telemedicine application and verified that I am speaking with the correct person using two identifiers.  Present at visit: Luetta Nutting, DO [place]   Patient Location: Home [place] [address]   Provider location:   [place] Complaint  Patient presents with   Diabetic Ketoacidosis   Hospitalization Follow-up    HPI  Lysette Brodersen is a 44 y.o. female who presents via audio/video conferencing for a telehealth visit today.  She is following up on recent hospitalization.  She had presented to the emergency department with complaint of nausea and vomiting.  She thought she had a GI bug however blood sugars were found to be elevated with mild acidosis and elevated anion gap on labs.  She received aggressive IV fluid resuscitation as well as insulin.  Levemir was started at 25 units nightly.  She was continued on [place].  She reports her GI symptoms have resolved at this point.  She is checking blood sugars at home and these are averaging around 170-180.  Blood sugars seem to be running higher in the morning when fasting.  She is taking her Levemir at night currently.  She reports that she feels well at this time.  She denies increased thirst or urination.  ROS:  A  comprehensive ROS was completed and negative except as noted per HPI    ROS:  A comprehensive ROS was completed and negative except as noted per HPI  Past Medical History:  Diagnosis Date   Abnormal uterine bleeding    Anxiety    Arthritis    knees   Chronic pelvic pain in female    Depression    Endometriosis of pelvis    Fibromyalgia    History of ketoacidosis    01-15-2014   PONV (postoperative nausea and vomiting)    Sinus tachycardia    Type 2 diabetes mellitus ([place])    Wears contact lenses     Past Surgical History:  Procedure Laterality Date   ABDOMINAL HYSTERECTOMY     BREAST REDUCTION SURGERY  age 89   CESAREAN SECTION  2002,  2005,  2008   DILATION AND CURETTAGE OF UTERUS N/A 02/13/2014   Procedure: DILATATION AND CURETTAGE;  Surgeon: Everitt Amber, MD;  Location: [facility];  Service: Gynecology;  Laterality: N/A;   LEFT OOPHORECTOMY  2019   MANDIBLE SURGERY  age 44   Correct overbite   REDUCTION MAMMAPLASTY      Family History  Adopted: Yes  Problem Relation Age of Onset   Hyperlipidemia Mother    Hypertension Mother    Diabetes Mother    Breast cancer Maternal Aunt    Endometriosis Maternal Aunt     Social History   Socioeconomic History   Marital status: Married    Spouse name: Not on file  Number of children: Not on file   Years of education: Not on file   Highest education level: Not on file  Occupational History   Not on file  Tobacco Use   Smoking status: Never   Smokeless tobacco: Never  Vaping Use   Vaping Use: Never used  Substance and Sexual Activity   Alcohol use: No   Drug use: No   Sexual activity: Yes    Birth control/protection: Surgical  Other Topics Concern   Not on file  Social History Narrative   Not on file   Social Determinants of Health   Financial Resource Strain: Not on file  Food Insecurity: Not on file  Transportation Needs: Not on file  Physical Activity: Not on file  Stress: Not on file   Social Connections: Not on file  Intimate Partner Violence: Not on file     Current Outpatient Medications:    acetaminophen (TYLENOL) 500 MG tablet, Take 500 mg by mouth every 6 (six) hours as needed., Disp: , Rfl:    ALPRAZolam (XANAX XR) 1 MG 24 hr tablet, Take 1 tablet (1 mg total) by mouth daily., Disp: 90 tablet, Rfl: 1   ALPRAZolam (XANAX) 1 MG tablet, Take 1 tablet in the afternoon as needed for anxiety., Disp: 30 tablet, Rfl: 2   Continuous Blood Gluc Receiver (FREESTYLE LIBRE 2 READER) DEVI, 1 Device by Does not apply route once for 1 dose., Disp: 1 each, Rfl: 0   Continuous Blood Gluc Sensor (FREESTYLE LIBRE 2 SENSOR) MISC, Apply new device every 14 days to monitor glucose., Disp: 6 each, Rfl: 3   cyclobenzaprine (FLEXERIL) 10 MG tablet, TAKE 1 TABLET BY MOUTH THREE TIMES A DAY AS NEEDED FOR MUSCLE SPASMS, Disp: 270 tablet, Rfl: 1   dapagliflozin propanediol (FARXIGA) 10 MG TABS tablet, Take 1 tablet (10 mg total) by mouth daily., Disp: 90 tablet, Rfl: 3   Dulaglutide (TRULICITY) 1.5 EH/2.0NO SOPN, Inject 1.5 mg into the skin once a week., Disp: 6 mL, Rfl: 1   escitalopram (LEXAPRO) 10 MG tablet, Take 10 mg by mouth daily., Disp: , Rfl:    insulin detemir (LEVEMIR FLEXTOUCH) 100 UNIT/ML FlexPen, Inject 26 Units into the skin daily., Disp: 30 mL, Rfl: 11   Insulin Pen Needle (ADVOCATE INSULIN PEN NEEDLES) 33G X 4 MM MISC, 1 each by Does not apply route daily., Disp: 100 each, Rfl: 12   metoprolol succinate (TOPROL-XL) 25 MG 24 hr tablet, Take 1 tablet by mouth daily., Disp: , Rfl:    ondansetron (ZOFRAN ODT) 4 MG disintegrating tablet, Take 1 tablet (4 mg total) by mouth every 8 (eight) hours as needed for nausea or vomiting., Disp: 20 tablet, Rfl: 0   promethazine (PHENERGAN) 25 MG tablet, Take 1 tablet (25 mg total) by mouth every 6 (six) hours as needed for nausea or vomiting., Disp: 30 tablet, Rfl: 2   QUEtiapine (SEROQUEL) 50 MG tablet, Take 1-2 tablets (50-100 mg total) by mouth  at bedtime., Disp: 180 tablet, Rfl: 2   traMADol (ULTRAM) 50 MG tablet, Take 1 tablet (50 mg total) by mouth daily as needed., Disp: 30 tablet, Rfl: 0   gabapentin (NEURONTIN) 600 MG tablet, Take 2 tablets (1,200 mg total) by mouth 3 (three) times daily., Disp: 180 tablet, Rfl: 2  EXAM:  VITALS per patient if applicable: Temp 70.9 F (36.6 C)   Ht 5\' 6"  (1.676 m)   Wt 260 lb (117.9 kg)   LMP  (LMP Unknown)   BMI 41.97 kg/m  GENERAL: alert, oriented, appears well and in no acute distress  HEENT: atraumatic, conjunttiva clear, no obvious abnormalities on inspection of external nose and ears  NECK: normal movements of the head and neck  LUNGS: on inspection no signs of respiratory distress, breathing rate appears normal, no obvious gross SOB, gasping or wheezing  CV: no obvious cyanosis  MS: moves all visible extremities without noticeable abnormality  PSYCH/NEURO: pleasant and cooperative, no obvious depression or anxiety, speech and thought processing grossly intact  ASSESSMENT AND PLAN:  Discussed the following assessment and plan:  Diabetes mellitus type 2 in obese Uncontrolled diabetes with recent admission for DKA.  Insulin was added to her current medications of Trulicity and [place].  She does continue to have elevated fasting blood sugars.  We will have her split her Levemir to 20 units in the evening as well as 10 units in the morning.  Updated labs ordered.     I discussed the assessment and treatment plan with the patient. The patient was provided an opportunity to ask questions and all were answered. The patient agreed with the plan and demonstrated an understanding of the instructions.   The patient was advised to call back or seek an in-person evaluation if the symptoms worsen or if the condition fails to improve as anticipated.    Luetta Nutting, DO

## 2020-07-08 NOTE — Progress Notes (Signed)
Today's CBG: 170

## 2020-07-08 NOTE — Assessment & Plan Note (Addendum)
Uncontrolled diabetes with recent admission for DKA.  Insulin was added to her current medications of Trulicity and [place].  She does continue to have elevated fasting blood sugars.  We will have her split her Levemir to 20 units in the evening as well as 10 units in the morning.  Adding freestyle libre for close monitoring of blood glucose.  Updated labs ordered.

## 2020-07-09 ENCOUNTER — Ambulatory Visit: Payer: 59 | Admitting: Obstetrics and Gynecology

## 2020-07-14 ENCOUNTER — Other Ambulatory Visit: Payer: Self-pay | Admitting: Family Medicine

## 2020-07-26 ENCOUNTER — Other Ambulatory Visit: Payer: Self-pay | Admitting: Osteopathic Medicine

## 2020-07-26 DIAGNOSIS — G8929 Other chronic pain: Secondary | ICD-10-CM

## 2020-08-13 ENCOUNTER — Encounter: Payer: Self-pay | Admitting: Family Medicine

## 2020-08-13 ENCOUNTER — Ambulatory Visit (INDEPENDENT_AMBULATORY_CARE_PROVIDER_SITE_OTHER): Payer: Self-pay | Admitting: Family Medicine

## 2020-08-13 DIAGNOSIS — F411 Generalized anxiety disorder: Secondary | ICD-10-CM

## 2020-08-13 DIAGNOSIS — I152 Hypertension secondary to endocrine disorders: Secondary | ICD-10-CM

## 2020-08-13 DIAGNOSIS — E1159 Type 2 diabetes mellitus with other circulatory complications: Secondary | ICD-10-CM

## 2020-08-13 DIAGNOSIS — M549 Dorsalgia, unspecified: Secondary | ICD-10-CM

## 2020-08-13 DIAGNOSIS — E1169 Type 2 diabetes mellitus with other specified complication: Secondary | ICD-10-CM

## 2020-08-13 DIAGNOSIS — G8929 Other chronic pain: Secondary | ICD-10-CM

## 2020-08-13 DIAGNOSIS — E669 Obesity, unspecified: Secondary | ICD-10-CM

## 2020-08-13 MED ORDER — LEVEMIR FLEXTOUCH 100 UNIT/ML ~~LOC~~ SOPN: 26.0000 [IU] | PEN_INJECTOR | Freq: Every day | SUBCUTANEOUS | 1 refills | Status: DC

## 2020-08-13 MED ORDER — QUETIAPINE FUMARATE 50 MG PO TABS: 50.0000 mg | ORAL_TABLET | Freq: Every day | ORAL | 2 refills | Status: DC

## 2020-08-13 MED ORDER — GABAPENTIN 600 MG PO TABS: 1200.0000 mg | ORAL_TABLET | Freq: Three times a day (TID) | ORAL | 2 refills | Status: DC

## 2020-08-13 MED ORDER — DAPAGLIFLOZIN PROPANEDIOL 10 MG PO TABS: 10.0000 mg | ORAL_TABLET | Freq: Every day | ORAL | 3 refills | Status: DC

## 2020-08-13 MED ORDER — ALPRAZOLAM 1 MG PO TABS: ORAL_TABLET | ORAL | 2 refills | Status: DC

## 2020-08-13 MED ORDER — CYCLOBENZAPRINE HCL 10 MG PO TABS: ORAL_TABLET | ORAL | 1 refills | Status: DC

## 2020-08-13 MED ORDER — TRULICITY 3 MG/0.5ML ~~LOC~~ SOAJ: 3.0000 mg | SUBCUTANEOUS | 1 refills | Status: DC

## 2020-08-14 ENCOUNTER — Encounter: Payer: Self-pay | Admitting: Family Medicine

## 2020-08-14 DIAGNOSIS — G8929 Other chronic pain: Secondary | ICD-10-CM

## 2020-08-14 DIAGNOSIS — M549 Dorsalgia, unspecified: Secondary | ICD-10-CM

## 2020-08-14 MED ORDER — DAPAGLIFLOZIN PROPANEDIOL 10 MG PO TABS: 10.0000 mg | ORAL_TABLET | Freq: Every day | ORAL | 3 refills | Status: DC

## 2020-08-14 MED ORDER — QUETIAPINE FUMARATE 50 MG PO TABS: 50.0000 mg | ORAL_TABLET | Freq: Every day | ORAL | 2 refills | Status: DC

## 2020-08-14 MED ORDER — LEVEMIR FLEXTOUCH 100 UNIT/ML ~~LOC~~ SOPN: 26.0000 [IU] | PEN_INJECTOR | Freq: Every day | SUBCUTANEOUS | 1 refills | Status: DC

## 2020-08-14 MED ORDER — TRULICITY 3 MG/0.5ML ~~LOC~~ SOAJ: 3.0000 mg | SUBCUTANEOUS | 1 refills | Status: DC

## 2020-08-14 MED ORDER — CYCLOBENZAPRINE HCL 10 MG PO TABS: ORAL_TABLET | ORAL | 1 refills | Status: DC

## 2020-08-14 MED ORDER — GABAPENTIN 600 MG PO TABS: 1200.0000 mg | ORAL_TABLET | Freq: Three times a day (TID) | ORAL | 2 refills | Status: DC

## 2020-08-16 MED ORDER — ALPRAZOLAM 1 MG PO TABS: ORAL_TABLET | ORAL | 2 refills | Status: DC

## 2020-08-16 NOTE — Assessment & Plan Note (Signed)
We will continue combination of quetiapine and alprazolam.  Stable at this time.

## 2020-08-16 NOTE — Assessment & Plan Note (Signed)
History of poorly controlled diabetes.  Recommend that she check her blood sugar regularly.  Increase Trulicity to 3 mg weekly.  Continue additional medications at current strength.

## 2020-08-16 NOTE — Assessment & Plan Note (Signed)
Blood pressure elevated at this time.  Recommend monitoring at home as well as engaging in a low-sodium diet.

## 2020-08-16 NOTE — Progress Notes (Signed)
Christine Grant - 45 y.o. female MRN [id number]  Date of birth: 05/01/1975  Subjective Chief Complaint  Patient presents with   Follow-up    HPI Christine Grant is a 45 year old female here today for follow-up visit.  She reports overall she is doing well.  She is feeling much better since her hospitalization for DKA a few months ago.  She reports she is doing well with current medications.  She has not checking her blood sugar regularly.  She denies any symptoms of hyperglycemia including increased thirst or urination.  She admits she could be more diligent with her diet.  She denies any hypoglycemia.  Anxiety is stable with combination of Seroquel and alprazolam as needed.  No side effects related to these medications.  She does need renewals of prescriptions.  ROS:  A comprehensive ROS was completed and negative except as noted per HPI    Allergies  Allergen Reactions   Toradol [Ketorolac Tromethamine] Other (See Comments)    Chest tightness, flushed     Past Medical History:  Diagnosis Date   Abnormal uterine bleeding    Anxiety    Arthritis    knees   Chronic pelvic pain in female    Depression    Endometriosis of pelvis    Fibromyalgia    History of ketoacidosis    01-15-2014   PONV (postoperative nausea and vomiting)    Sinus tachycardia    Type 2 diabetes mellitus ([place])    Wears contact lenses     Past Surgical History:  Procedure Laterality Date   ABDOMINAL HYSTERECTOMY     BREAST REDUCTION SURGERY  age 89   CESAREAN SECTION  2002,  2005,  2008   DILATION AND CURETTAGE OF UTERUS N/A 02/13/2014   Procedure: DILATATION AND CURETTAGE;  Surgeon: Everitt Amber, MD;  Location: [facility];  Service: Gynecology;  Laterality: N/A;   LEFT OOPHORECTOMY  2019   MANDIBLE SURGERY  age 26   Correct overbite   REDUCTION MAMMAPLASTY      Social History   Socioeconomic History   Marital status: Married    Spouse name: Not on file   Number of children: Not  on file   Years of education: Not on file   Highest education level: Not on file  Occupational History   Not on file  Tobacco Use   Smoking status: Never   Smokeless tobacco: Never  Vaping Use   Vaping Use: Never used  Substance and Sexual Activity   Alcohol use: No   Drug use: No   Sexual activity: Yes    Birth control/protection: Surgical  Other Topics Concern   Not on file  Social History Narrative   Not on file   Social Determinants of Health   Financial Resource Strain: Not on file  Food Insecurity: Not on file  Transportation Needs: Not on file  Physical Activity: Not on file  Stress: Not on file  Social Connections: Not on file    Family History  Adopted: Yes  Problem Relation Age of Onset   Hyperlipidemia Mother    Hypertension Mother    Diabetes Mother    Breast cancer Maternal Aunt    Endometriosis Maternal Aunt     Health Maintenance  Topic Date Due   Pneumococcal Vaccine 88-22 Years old (1 - PCV) Never done   OPHTHALMOLOGY EXAM  Never done   HIV Screening  Never done   Hepatitis C Screening  Never done   URINE MICROALBUMIN  12/23/2015   COVID-19 Vaccine (3 - Booster for Moderna series) 08/13/2019   FOOT EXAM  05/12/2021 (Originally 06/25/2019)   HEMOGLOBIN A1C  11/12/2020   TETANUS/TDAP  01/25/2023   HPV VACCINES  Aged Out     ----------------------------------------------------------------------------------------------------------------------------------------------------------------------------------------------------------------- Physical Exam BP (!) 150/90   Pulse (!) 141   Ht '5\' 6"'$  (1.676 m)   Wt 260 lb (117.9 kg)   LMP  (LMP Unknown)   SpO2 97%   BMI 41.97 kg/m   Physical Exam Constitutional:      Appearance: Normal appearance.  HENT:     Head: Normocephalic and atraumatic.     Mouth/Throat:     Mouth: Mucous membranes are moist.  Eyes:     General: No scleral icterus. Cardiovascular:     Rate and Rhythm: Normal rate and  regular rhythm.  Pulmonary:     Effort: Pulmonary effort is normal.     Breath sounds: Normal breath sounds.  Musculoskeletal:     Cervical back: Neck supple.  Neurological:     General: No focal deficit present.     Mental Status: She is alert.  Psychiatric:        Mood and Affect: Mood normal.        Behavior: Behavior normal.    ------------------------------------------------------------------------------------------------------------------------------------------------------------------------------------------------------------------- Assessment and Plan  Hypertension associated with diabetes ([place]) Blood pressure elevated at this time.  Recommend monitoring at home as well as engaging in a low-sodium diet.  Diabetes mellitus type 2 in obese History of poorly controlled diabetes.  Recommend that she check her blood sugar regularly.  Increase Trulicity to 3 mg weekly.  Continue additional medications at current strength.  GAD (generalized anxiety disorder) We will continue combination of quetiapine and alprazolam.  Stable at this time.   Meds ordered this encounter  Medications   ALPRAZolam (XANAX) 1 MG tablet    Sig: Take 1 tablet in the afternoon as needed for anxiety.    Dispense:  30 tablet    Refill:  2    Not to exceed 5 additional fills before 08/22/2015.   DISCONTD: cyclobenzaprine (FLEXERIL) 10 MG tablet    Sig: TAKE 1 TABLET BY MOUTH THREE TIMES A DAY AS NEEDED FOR MUSCLE SPASMS    Dispense:  270 tablet    Refill:  1   DISCONTD: dapagliflozin propanediol (FARXIGA) 10 MG TABS tablet    Sig: Take 1 tablet (10 mg total) by mouth daily.    Dispense:  90 tablet    Refill:  3   DISCONTD: gabapentin (NEURONTIN) 600 MG tablet    Sig: Take 2 tablets (1,200 mg total) by mouth 3 (three) times daily.    Dispense:  180 tablet    Refill:  2   DISCONTD: insulin detemir (LEVEMIR FLEXTOUCH) 100 UNIT/ML FlexPen    Sig: Inject 26 Units into the skin daily.    Dispense:  25  mL    Refill:  1   DISCONTD: QUEtiapine (SEROQUEL) 50 MG tablet    Sig: Take 1-2 tablets (50-100 mg total) by mouth at bedtime.    Dispense:  180 tablet    Refill:  2   DISCONTD: Dulaglutide (TRULICITY) 3 0000000 SOPN    Sig: Inject 3 mg as directed once a week.    Dispense:  6 mL    Refill:  1    Return in about 2 months (around 10/13/2020) for DM.    This visit occurred during the SARS-CoV-2 public health emergency.  Safety protocols were in place,  including screening questions prior to the visit, additional usage of staff PPE, and extensive cleaning of exam room while observing appropriate contact time as indicated for disinfecting solutions.

## 2020-08-17 ENCOUNTER — Encounter: Payer: Self-pay | Admitting: Family Medicine

## 2020-08-17 ENCOUNTER — Other Ambulatory Visit: Payer: Self-pay | Admitting: Family Medicine

## 2020-08-17 MED ORDER — ALPRAZOLAM ER 1 MG PO TB24: 1.0000 mg | ORAL_TABLET | Freq: Every day | ORAL | 1 refills | Status: DC

## 2020-08-17 MED ORDER — TRAMADOL HCL 50 MG PO TABS: 50.0000 mg | ORAL_TABLET | Freq: Every day | ORAL | 1 refills | Status: DC | PRN

## 2020-08-18 NOTE — Telephone Encounter (Signed)
Spoke with Marjory Lies at [facility].  Advised him that it is okay to fill the RX for the Xanax XR.  Advised him to discontinue any refills for the IR.  MyChart message sent to pt informing her of RX for the XR formulation.  Charyl Bigger, CMA

## 2020-08-19 IMAGING — CT CT ABD-PELV W/ CM
2 of 5 series · 15 of 46 positions shown, 17 images · IV contrast (omnipaque)
Comparison: Ultrasound 10/27/2015, CT 03/05/2015

CLINICAL DATA: Right lower quadrant abdominal pain, nausea and
vomiting.

EXAM:
CT ABDOMEN AND PELVIS WITH CONTRAST
TECHNIQUE: Multidetector CT imaging of the abdomen and pelvis was performed
using the standard protocol following bolus administration of
intravenous contrast.
CONTRAST:  100mL OMNIPAQUE IOHEXOL 300 MG/ML  SOLN

[Series 2: axial st · axial · 0.97mm/px · z∈[-587,-132]mm · 12 of 107 slices shown, 14 images]
[im 8/107  soft-tissue]
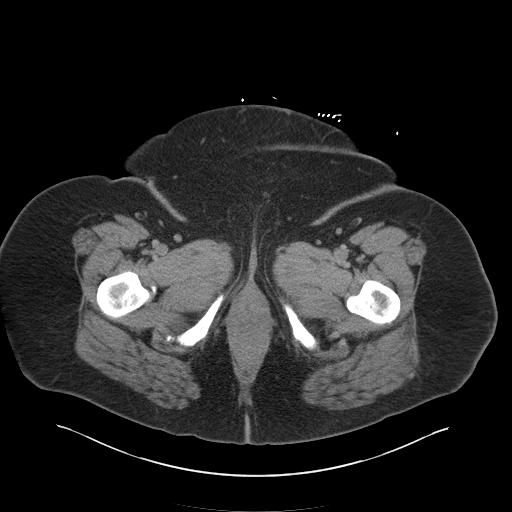
[im 8/107  bone]
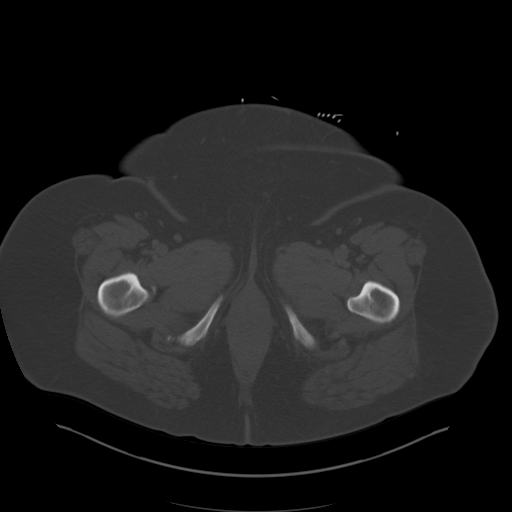
[im 16/107  soft-tissue]
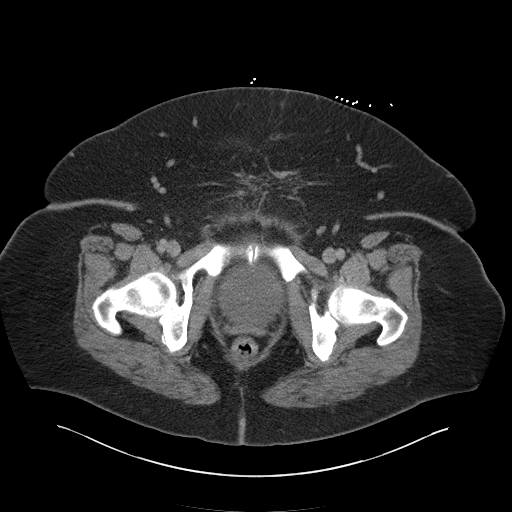
[im 23/107  soft-tissue]
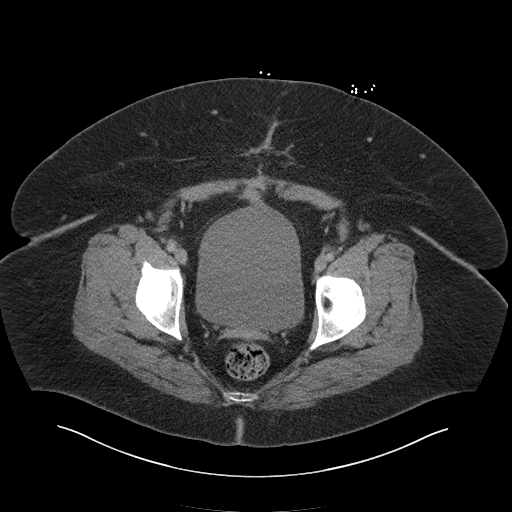
[im 31/107  soft-tissue]
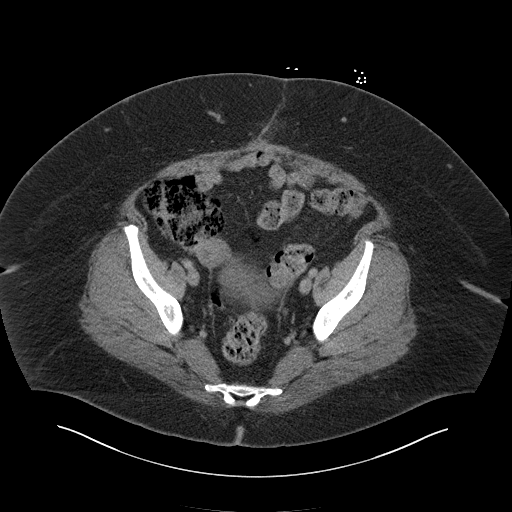
[im 38/107  soft-tissue]
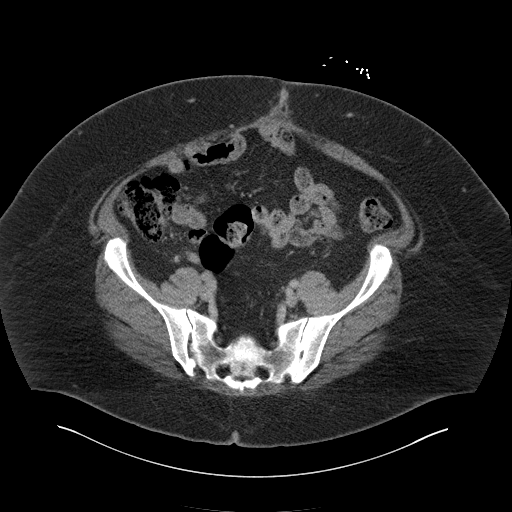
[im 46/107  soft-tissue]
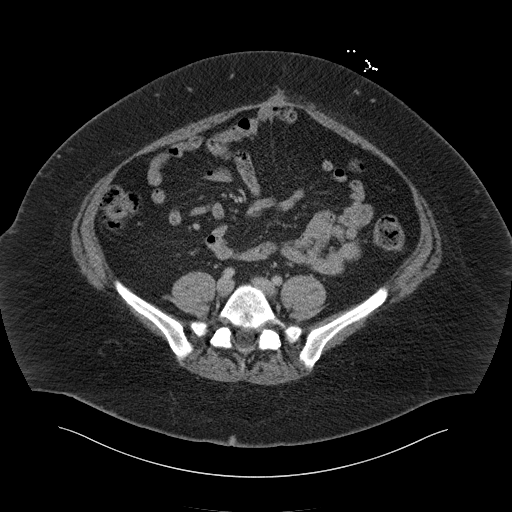
[im 61/107  soft-tissue]
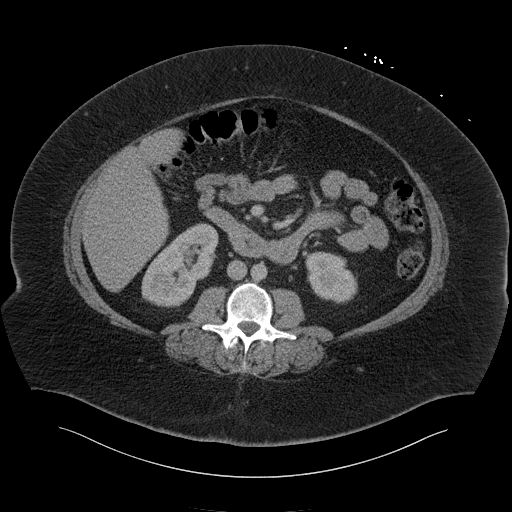
[im 69/107  soft-tissue]
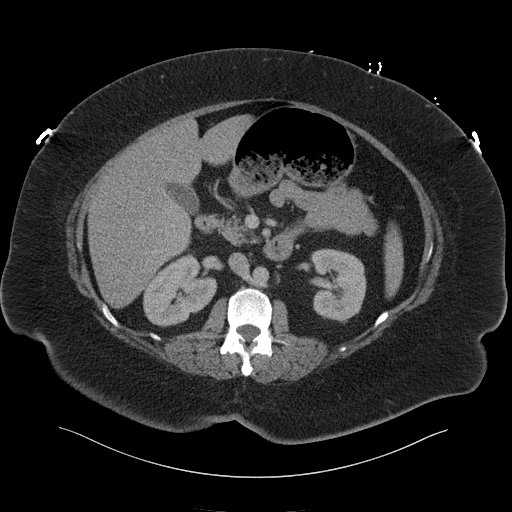
[im 76/107  soft-tissue]
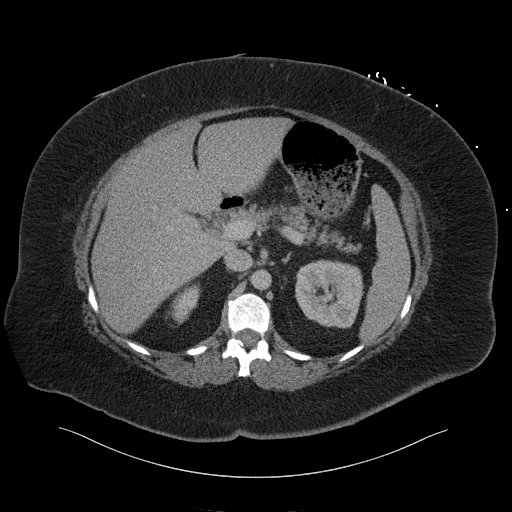
[im 76/107  bone]
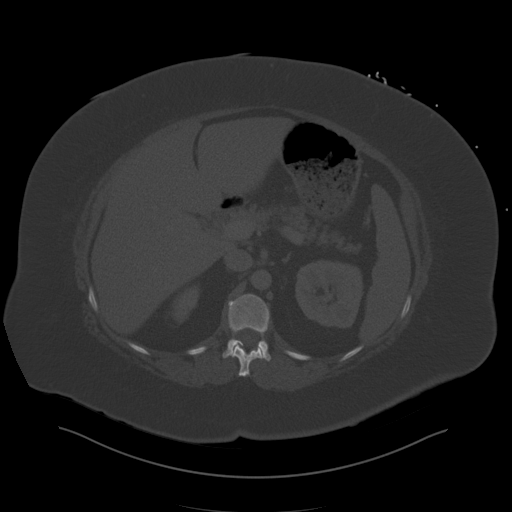
[im 84/107  soft-tissue]
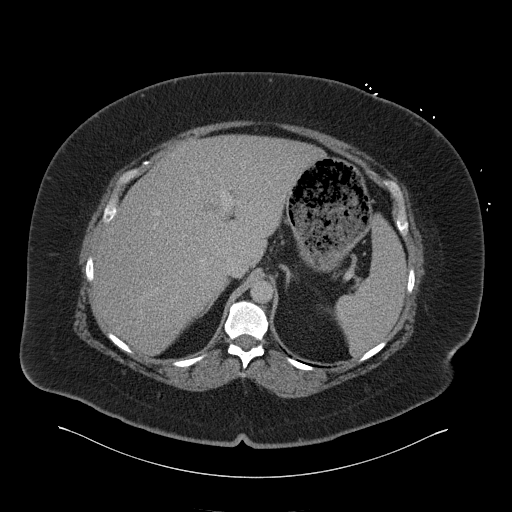
[im 91/107  soft-tissue]
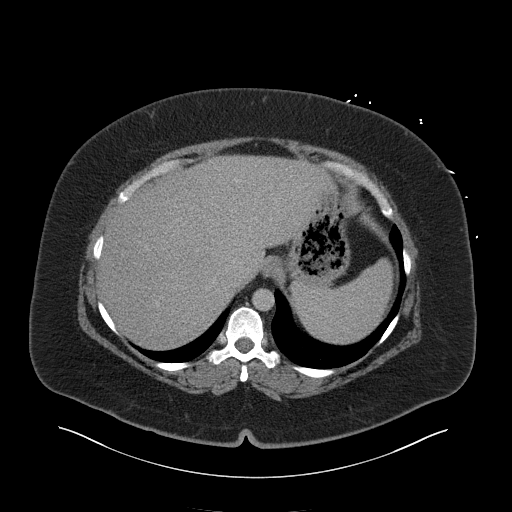
[im 99/107  soft-tissue]
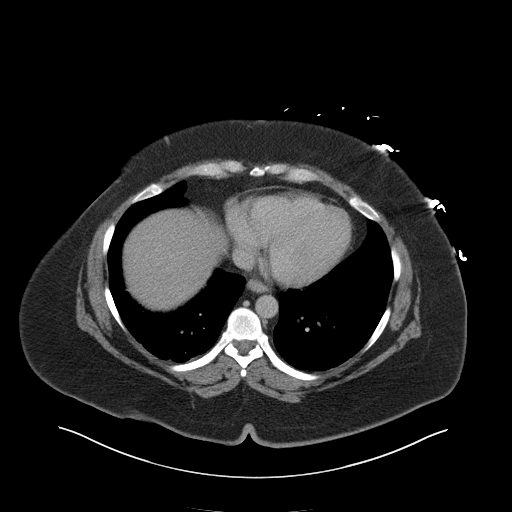

[Series 5: coronal st · coronal · 0.96mm/px · 3 of 191 slices shown]
[im 64/191  soft-tissue]
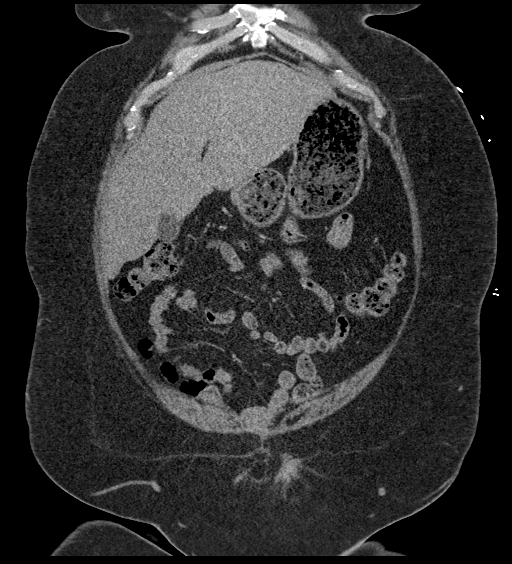
[im 85/191  soft-tissue]
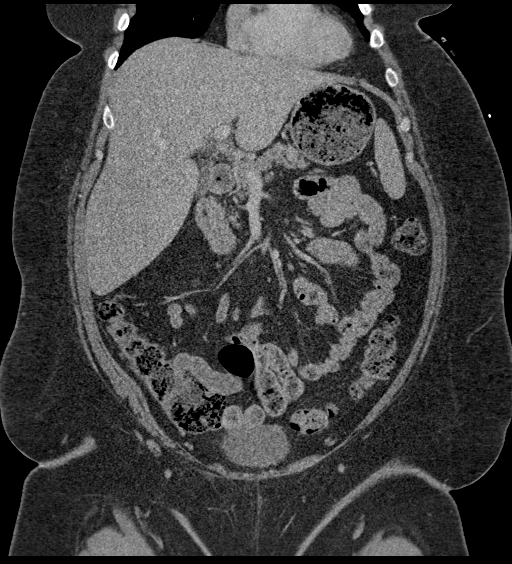
[im 106/191  soft-tissue]
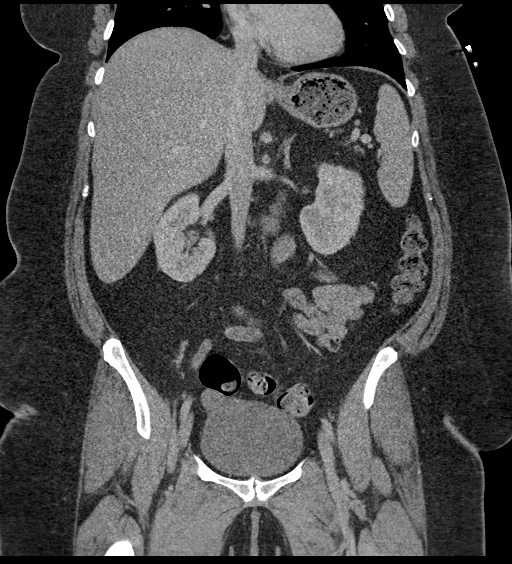

[15 of 46 positions shown; findings below may reference images not displayed]

FINDINGS: Lower chest: Atelectatic changes in the otherwise clear lung bases.
Trace pericardial fluid similar to prior and likely within
physiologic normal. Normal cardiac size. Included mediastinum is
otherwise unremarkable.

Hepatobiliary: Ill-defined 1.5 cm hypoattenuating lesion in the
posterior segment IV ([DATE], 5/75). No progressive enhancement on
delayed imaging. Not discernible on prior imaging. Favor benign
hepatic cyst however incompletely characterized on this exam. No
other focal liver lesions. Smooth liver surface contour. Normal
hepatic attenuation. No gallstones, gallbladder wall thickening, or
biliary dilatation.

Pancreas: Unremarkable. No pancreatic ductal dilatation or
surrounding inflammatory changes.

Spleen: Normal in size without focal abnormality.

Adrenals/Urinary Tract: Normal adrenal glands. Kidneys are normally
located with symmetric enhancement and excretion. No suspicious
renal lesion, urolithiasis or hydronephrosis. Chronic scarring along
the anterior bladder. Urinary bladder is otherwise unremarkable.

Stomach/Bowel: Distal esophagus, stomach and duodenal sweep are
unremarkable. No small bowel wall thickening or dilatation. No
evidence of obstruction. Tip of the appendix is borderline dilated
at 8 mm with very little focal inflammation only seen on coronal
imaging (5/102). No extraluminal gas or fluid collection is seen. No
colonic dilatation or wall thickening.

Vascular/Lymphatic: No significant vascular findings are present. No
enlarged abdominal or pelvic lymph nodes.

Reproductive: Patient is post hysterectomy and left oophorectomy.
Retained right ovarian tissue without acute abnormality or worrisome
adnexal lesion.

Other: Postsurgical changes of the low anterior pelvic wall
compatible prior surgical change. No bowel containing hernia. No
abdominopelvic free air or fluid.

Musculoskeletal: No acute osseous abnormality or suspicious osseous
lesion. Stable Schmorl's node formation at T10. Vertebral body
hemangioma at L1. Mild degenerative changes of the spine hips and
pelvis.
IMPRESSION: 1. Tip of the appendix is borderline dilated at 8 mm with very
minimal inflammation. Could reflect early or developing
appendicitis.
2. Ill-defined 1.5 cm hypoattenuating lesion in the posterior
segment IV ([DATE], 5/75). No progressive enhancement on delayed
imaging. Favor benign hepatic cyst however incompletely
characterized on this exam. Could consider further evaluation with
nonemergent liver MRI.
3. Chronic scarring along the anterior bladder and in the soft
tissues of the low anterior pelvic wall.
4. Prior hysterectomy and left oophorectomy. Retained right ovarian
tissue without acute abnormality or worrisome adnexal lesion.

## 2020-09-04 ENCOUNTER — Telehealth: Payer: Self-pay | Admitting: General Practice

## 2020-09-04 NOTE — Telephone Encounter (Signed)
Transition Care Management Follow-up Telephone Call Date of discharge and from where: 09/03/20 from [facility] How have you been since you were released from the hospital? Still in pain.  Any questions or concerns? No  Items Reviewed: Did the pt receive and understand the discharge instructions provided? Yes  Medications obtained and verified? Yes  Other? No  Any new allergies since your discharge? No  Dietary orders reviewed? Yes Do you have support at home? Yes   Home Care and Equipment/Supplies: Were home health services ordered? no  Functional Questionnaire: (I = Independent and D = Dependent) ADLs: I  Bathing/Dressing- I  Meal Prep- I  Eating- I  Maintaining continence- I  Transferring/Ambulation- I  Managing Meds- I  Follow up appointments reviewed:  PCP Hospital f/u appt confirmed? No  Didn't want to schedule an appointment at this time since she has an appointment with her OBGYN scheduled. [facility] f/u appt confirmed? Yes  Scheduled to see [facility] on 09/10/20. Are transportation arrangements needed? No  If their condition worsens, is the pt aware to call PCP or go to the Emergency Dept.? Yes Was the patient provided with contact information for the PCP's office or ED? Yes Was to pt encouraged to call back with questions or concerns? Yes

## 2020-09-10 ENCOUNTER — Ambulatory Visit: Payer: Self-pay | Admitting: Obstetrics and Gynecology

## 2020-09-14 ENCOUNTER — Other Ambulatory Visit: Payer: Self-pay | Admitting: Family Medicine

## 2020-09-18 ENCOUNTER — Encounter: Payer: Self-pay | Admitting: Family Medicine

## 2020-09-21 ENCOUNTER — Other Ambulatory Visit: Payer: Self-pay

## 2020-09-21 ENCOUNTER — Emergency Department (HOSPITAL_BASED_OUTPATIENT_CLINIC_OR_DEPARTMENT_OTHER)
Admission: EM | Admit: 2020-09-21 | Discharge: 2020-09-22 | Disposition: A | Discharge status: Home / Self Care | Payer: Self-pay | Attending: Emergency Medicine | Admitting: Emergency Medicine

## 2020-09-21 ENCOUNTER — Encounter (HOSPITAL_BASED_OUTPATIENT_CLINIC_OR_DEPARTMENT_OTHER): Payer: Self-pay | Admitting: *Deleted

## 2020-09-21 ENCOUNTER — Encounter: Payer: Self-pay | Admitting: Obstetrics and Gynecology

## 2020-09-21 ENCOUNTER — Ambulatory Visit (INDEPENDENT_AMBULATORY_CARE_PROVIDER_SITE_OTHER): Payer: Self-pay | Admitting: Obstetrics and Gynecology

## 2020-09-21 VITALS — Ht 66.0 in | Wt 258.0 lb

## 2020-09-21 DIAGNOSIS — Z794 Long term (current) use of insulin: Secondary | ICD-10-CM | POA: Insufficient documentation

## 2020-09-21 DIAGNOSIS — N3000 Acute cystitis without hematuria: Secondary | ICD-10-CM

## 2020-09-21 DIAGNOSIS — I10 Essential (primary) hypertension: Secondary | ICD-10-CM | POA: Insufficient documentation

## 2020-09-21 DIAGNOSIS — N83201 Unspecified ovarian cyst, right side: Secondary | ICD-10-CM

## 2020-09-21 DIAGNOSIS — B9689 Other specified bacterial agents as the cause of diseases classified elsewhere: Secondary | ICD-10-CM | POA: Insufficient documentation

## 2020-09-21 DIAGNOSIS — Z79899 Other long term (current) drug therapy: Secondary | ICD-10-CM | POA: Insufficient documentation

## 2020-09-21 DIAGNOSIS — Z7984 Long term (current) use of oral hypoglycemic drugs: Secondary | ICD-10-CM | POA: Insufficient documentation

## 2020-09-21 DIAGNOSIS — I152 Hypertension secondary to endocrine disorders: Secondary | ICD-10-CM

## 2020-09-21 DIAGNOSIS — N390 Urinary tract infection, site not specified: Secondary | ICD-10-CM | POA: Insufficient documentation

## 2020-09-21 DIAGNOSIS — E1159 Type 2 diabetes mellitus with other circulatory complications: Secondary | ICD-10-CM

## 2020-09-21 DIAGNOSIS — E119 Type 2 diabetes mellitus without complications: Secondary | ICD-10-CM | POA: Insufficient documentation

## 2020-09-21 DIAGNOSIS — R1031 Right lower quadrant pain: Secondary | ICD-10-CM

## 2020-09-21 LAB — COMPREHENSIVE METABOLIC PANEL
ALT: 25 U/L (ref 0–44)
AST: 13 U/L — ABNORMAL LOW (ref 15–41)
Albumin: 4 g/dL (ref 3.5–5.0)
Alkaline Phosphatase: 68 U/L (ref 38–126)
Anion gap: 14 (ref 5–15)
BUN: 6 mg/dL (ref 6–20)
CO2: 24 mmol/L (ref 22–32)
Calcium: 9.6 mg/dL (ref 8.9–10.3)
Chloride: 100 mmol/L (ref 98–111)
Creatinine, Ser: 0.76 mg/dL (ref 0.44–1.00)
GFR, Estimated: >60 mL/min (ref >60)
Glucose, Bld: 484 mg/dL — ABNORMAL HIGH (ref 70–99)
Potassium: 4.1 mmol/L (ref 3.5–5.1)
Sodium: 138 mmol/L (ref 135–145)
Total Bilirubin: 0.6 mg/dL (ref 0.3–1.2)
Total Protein: 6.6 g/dL (ref 6.5–8.1)

## 2020-09-21 LAB — URINALYSIS, MICROSCOPIC (REFLEX)

## 2020-09-21 LAB — MAGNESIUM: Magnesium: 2.1 mg/dL (ref 1.7–2.4)

## 2020-09-21 LAB — CBC
HCT: 47.6 % — ABNORMAL HIGH (ref 36.0–46.0)
Hemoglobin: 16.7 g/dL — ABNORMAL HIGH (ref 12.0–15.0)
MCH: 28.6 pg (ref 26.0–34.0)
MCHC: 35.1 g/dL (ref 30.0–36.0)
MCV: 81.5 fL (ref 80.0–100.0)
Platelets: 298 10*3/uL (ref 150–400)
RBC: 5.84 MIL/uL — ABNORMAL HIGH (ref 3.87–5.11)
RDW: 12.9 % (ref 11.5–15.5)
WBC: 7.8 10*3/uL (ref 4.0–10.5)
nRBC: 0 % (ref 0.0–0.2)

## 2020-09-21 LAB — URINALYSIS, ROUTINE W REFLEX MICROSCOPIC
Bilirubin Urine: NEGATIVE
Glucose, UA: >=500 mg/dL — AB
Hgb urine dipstick: NEGATIVE
Ketones, ur: 15 mg/dL — AB
Leukocytes,Ua: NEGATIVE
Nitrite: NEGATIVE
Protein, ur: NEGATIVE mg/dL
Specific Gravity, Urine: 1.01 (ref 1.005–1.030)
pH: 6 (ref 5.0–8.0)

## 2020-09-21 LAB — LIPASE, BLOOD: Lipase: 35 U/L (ref 11–51)

## 2020-09-21 MED ORDER — METOPROLOL SUCCINATE ER 25 MG PO TB24: 25.0000 mg | ORAL_TABLET | Freq: Every day | ORAL | 2 refills | Status: DC

## 2020-09-21 MED ORDER — MORPHINE SULFATE (PF) 4 MG/ML IV SOLN
4.0000 mg | Freq: Once | INTRAVENOUS | Status: AC
  Administered 2020-09-21: 4 mg via INTRAVENOUS
  Filled 2020-09-21: qty 1

## 2020-09-21 MED ORDER — SODIUM CHLORIDE 0.9 % IV SOLN: INTRAVENOUS | Status: DC | PRN

## 2020-09-21 MED ORDER — SODIUM CHLORIDE 0.9 % IV SOLN
1.0000 g | Freq: Once | INTRAVENOUS | Status: AC
  Administered 2020-09-21: 1 g via INTRAVENOUS
  Filled 2020-09-21: qty 10

## 2020-09-21 MED ORDER — TRIAMCINOLONE ACETONIDE 0.5 % EX CREA: 1.0000 "application " | TOPICAL_CREAM | Freq: Two times a day (BID) | CUTANEOUS | 2 refills | Status: DC

## 2020-09-21 MED ORDER — ONDANSETRON HCL 4 MG/2ML IJ SOLN
4.0000 mg | Freq: Once | INTRAMUSCULAR | Status: AC
  Administered 2020-09-21: 4 mg via INTRAVENOUS
  Filled 2020-09-21: qty 2

## 2020-09-21 NOTE — Progress Notes (Signed)
GYNECOLOGY OFFICE VISIT NOTE  History:   Christine Grant is a 45 y.o. [phone] here today for discussion regarding ovarian cysts with her husband with her today as support. She has had a hysterectomy which was complicated by endometriosis and significant scar tissue. She then had emergency surgery for her left ovary which was converted to open surgery due to her scar tissue. That surgery was for pain related to an ovarian cyst. She still has her right ovary. That ovary has intermittently made cysts over the last several years. She does not want OCPs due to [facility] of breast cancer (mother has stage 4).She does not want surgery per se because she knows she is at high risk for injury due to her prior surgical history and her known presence of scar tissue. She has had surgeries that in the past she has been told should have taken 1/3 of the time they would normally take due to the scar tissue. Some of the cysts cause mild pain and some of them cause severe pain resulting in her needing to go to the ER. Some cysts have been simple and some complex.   Dr. Elonda Husky did their daughter's surgery some months ago and they were very happy with their care.   She denies any abnormal vaginal discharge, bleeding, pelvic pain or other concerns.     Past Medical History:  Diagnosis Date   Abnormal uterine bleeding    Anxiety    Arthritis    knees   Chronic pelvic pain in female    Depression    Endometriosis of pelvis    Fibromyalgia    History of ketoacidosis    01-15-2014   PONV (postoperative nausea and vomiting)    Sinus tachycardia    Type 2 diabetes mellitus ([place])    Wears contact lenses     Past Surgical History:  Procedure Laterality Date   ABDOMINAL HYSTERECTOMY     BREAST REDUCTION SURGERY  age 86   CESAREAN SECTION  2002,  2005,  2008   DILATION AND CURETTAGE OF UTERUS N/A 02/13/2014   Procedure: DILATATION AND CURETTAGE;  Surgeon: Everitt Amber, MD;  Location: [place];  Service:  Gynecology;  Laterality: N/A;   LEFT OOPHORECTOMY  2019   MANDIBLE SURGERY  age 57   Correct overbite   REDUCTION MAMMAPLASTY      The following portions of the patient's history were reviewed and updated as appropriate: allergies, current medications, past family history, past medical history, past social history, past surgical history and problem list.   Health Maintenance:  Normal mammogram on 10/2018. She has mxr ordered through Dr. Sheppard Coil  Review of Systems:  Pertinent items noted in HPI and remainder of comprehensive ROS otherwise negative.  Physical Exam:  Ht '5\' 6"'$  (1.676 m)   Wt 258 lb (117 kg)   LMP  (LMP Unknown)   BMI 41.64 kg/m  CONSTITUTIONAL: Well-developed, well-nourished female in no acute distress.  HEENT:  Normocephalic, atraumatic. External right and left ear normal. No scleral icterus.  NECK: Normal range of motion, supple, no masses noted on observation SKIN: No rash noted. Not diaphoretic. No erythema. No pallor. MUSCULOSKELETAL: Normal range of motion. No edema noted. NEUROLOGIC: Alert and oriented to person, place, and time. Normal muscle tone coordination. No cranial nerve deficit noted. PSYCHIATRIC: Normal mood and affect. Normal behavior. Normal judgment and thought content.  PELVIC: Deferred  Labs and Imaging Her last [place] was 09/03/20 which showed hysterectomy, left oopherectomy, 1.4 cm simple  right ovary cust [place] 02/2020 showed 1.8 cm simple cyst  Reviewed her operative note from 2019 - surgery done at [facility] with Dr. Gerrit Friends and resident service. With regards to the right ovary, it was noted to be "adhered to the sidewall"  Reviewed appendectomy operative note from 06/28/19 - "With a #15 blade a vertical 2 cm incision is performed over the right side of the abdominal wall over the rectus muscle. The subcutaneous tissue was dissected with electrocautery. Fascia is open. The intra-abdominal cavity is explored. Multiple adhesions of the colon and the small  bowel to the abdominal wall were seen." Assessment and Plan:    1. Right ovarian cyst - We discussed at length her surgical history and current symptoms  - We discussed the risk of surgery in her case would be significant due to her prior surgeries and known scar tissue.  - We reviewed that while ovarian cysts are usually not painful, likely her right ovary is covered to some extend by peritoneum vs scar tissue. Upon review of the operative note from her LSO, it appears it is at the very least adherent to her sidewall. In this case this makes it more likely that depending on the location of the cyst within the ovary some cysts would be more painful than others. This c/w her history - We discussed that even without her upper abdominal scar tissue, she is at risk due to the adherence of the right ovary to the side wall for more than the usual risks of surgery whether open or laparoscopic I.e. injury to the side wall vessels I.e. the external illiac and injury to the ureters. We discussed that even with right oopherectomy, she would still be at high risk also for ovarian remnant syndrome.  - We discussed also routine risks of surgery being bleeding/infection/injury to surround organs/tissues I.e bowel or bladder or as noted above. She would be at high risk for conversation to open surgery as noted in her last two surgeries. There is also the risk of the anesthesia due to the more prolonged nature of a surgery due to the scar tissue. We discussed that also more surgery can result in more scar tissue. Based on prior op notes it doesn't appear that there are bowel adhesions by her right ovary.  - Discussed the importance of avoiding surgery. Following our discussion they are in complete agreement.  - Discussed Depo Lupron as an option to bring on menopause and remain there until through her own natural menopause. Discussed side effects of the medication which is the intended effect of menopause. Reviewed impact  on current other medical conditions. Discussed surgical menopause is generally worse than natural menopause. Information given on Depo Lupron for their review. They are interested and will wait for insurance to finalize.   Routine preventative health maintenance measures emphasized. Please refer to After Visit Summary for other counseling recommendations.   No follow-ups on file.    I spent  >66  minutes dedicated to the care of this patient including pre-visit review of records, face to face time with the patient discussing her conditions and treatments and post visit orders.    Radene Gunning, MD, [place] for [facility], [place]

## 2020-09-21 NOTE — Progress Notes (Signed)
First BP 164/102 Repeat BP 164/109 Pt is going to walk down to Dr.Matthew's office (PCP) to schedule appt to discuss blood pressure  Positive Nitrites on urine dip

## 2020-09-21 NOTE — ED Triage Notes (Addendum)
C/o right lower abd pain x 8 hrs , increased bp and increased HR  and chest pressure x 1 day

## 2020-09-21 NOTE — ED Provider Notes (Signed)
[facility] EMERGENCY DEPARTMENT Provider Note   CSN: [id number] Arrival date & time: 09/21/20  1909     History Chief Complaint  Patient presents with   Abdominal Pain    Christine Grant is a 45 y.o. female.  The history is provided by the patient.  Abdominal Pain She has history of diabetes, hyperlipidemia, endometriosis and comes in with right lower quadrant pain which started about 1 PM.  She frequently has pain in this area related to ovarian cysts.  She is status post hysterectomy and left oophorectomy as well as status post appendectomy.  She had been in to see her gynecologist and plan was to start Lupron to induce menopause to prevent additional cyst from forming.  She currently rates pain at 8/10.  This afternoon, blood pressure had been elevated to 160/100.  She also noted that her heart rate had increased.  She has not taken anything for pain.  There has been some nausea but no vomiting.  Also, she recently had a UTI for which she was prescribed cephalexin.  She currently is having dysuria without urinary urgency or frequency and is concerned she may have recurrent UTI.  She denies fever chills or sweats.   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   S/P appendectomy 06/28/2019   Appendicitis 06/18/2019   S/P unilateral salpingo-oophorectomy 07/12/2017   Anemia 07/01/2015   Sinus tachycardia 05/27/2015   History of migraine headaches 05/27/2015   Benign paroxysmal  positional vertigo 02/02/2015   Hypertension associated with diabetes ([place]) 02/02/2015   Primary osteoarthritis of both hips 01/22/2015   Degenerative disc disease, cervical 01/22/2015   Hypertriglyceridemia 12/24/2014   Morbid obesity ([place]) 07/09/2014   Fibromyalgia muscle pain 10/07/2013   Diabetes mellitus type 2 in obese ([place]) 10/07/2013   GAD (generalized anxiety disorder) 10/07/2013   Endometriosis of pelvis 10/07/2013    Past Surgical History:  Procedure Laterality Date   ABDOMINAL HYSTERECTOMY     BREAST REDUCTION SURGERY  age 30   CESAREAN SECTION  2002,  2005,  2008   DILATION AND CURETTAGE OF UTERUS N/A 02/13/2014   Procedure: DILATATION AND CURETTAGE;  Surgeon: Everitt Amber, MD;  Location: [facility];  Service: Gynecology;  Laterality: N/A;   LEFT OOPHORECTOMY  2019   MANDIBLE SURGERY  age 34   Correct overbite   REDUCTION MAMMAPLASTY       OB History     Gravida  4   Para  3   Term  3   Preterm      AB      Living  3      SAB      IAB      Ectopic      Multiple      Live Births              Family History  Adopted: Yes  Problem Relation Age of Onset   Hyperlipidemia Mother    Hypertension Mother    Diabetes Mother    Breast cancer Maternal Aunt    Endometriosis Maternal Aunt     Social History   Tobacco Use   Smoking status: Never   Smokeless tobacco: Never  Vaping Use   Vaping Use: Never used  Substance Use Topics   Alcohol use: No   Drug use: No    Home Medications Prior to Admission medications   Medication Sig Start Date End Date Taking? Authorizing Provider  acetaminophen (TYLENOL) 500 MG tablet Take 500 mg by mouth every 6 (six) hours as needed.    [provider]  ALPRAZolam (XANAX XR) 1 MG 24 hr tablet Take 1 tablet (1 mg total) by mouth daily. 08/17/20   Luetta Nutting, DO  ALPRAZolam Duanne Moron) 1 MG tablet TAKE 1 TABLET BY MOUTH IN THE AFTERNOON AS NEEDED FOR ANXIETY 09/17/20   Luetta Nutting, DO   Continuous Blood Gluc Sensor (FREESTYLE LIBRE 2 SENSOR) MISC Apply new device every 14 days to monitor glucose. 07/08/20   Luetta Nutting, DO  cyclobenzaprine (FLEXERIL) 10 MG tablet TAKE 1 TABLET BY MOUTH THREE TIMES A DAY AS NEEDED FOR MUSCLE SPASMS 08/14/20   Luetta Nutting, DO  dapagliflozin propanediol (FARXIGA) 10 MG TABS tablet Take 1 tablet (10 mg total) by mouth daily. 08/14/20   Luetta Nutting, DO  Dulaglutide (TRULICITY) 3 0000000 SOPN Inject 3 mg as directed once a week. 08/14/20 11/12/20  Luetta Nutting, DO  gabapentin (NEURONTIN) 600 MG tablet Take 2 tablets (1,200 mg total) by mouth 3 (three) times daily. 08/14/20 11/12/20  Luetta Nutting, DO  insulin detemir (LEVEMIR FLEXTOUCH) 100 UNIT/ML FlexPen Inject 26 Units into the skin daily. 08/14/20 11/12/20  Luetta Nutting, DO  Insulin Pen Needle (ADVOCATE INSULIN PEN NEEDLES) 33G X 4 MM MISC 1 each by Does not apply route daily. 10/01/18   Gregor Hams, MD  metoprolol succinate (TOPROL-XL) 25 MG 24 hr tablet Take 1 tablet (25 mg total) by mouth daily. 09/21/20 12/20/20  Emeterio Reeve, DO  ondansetron (ZOFRAN ODT) 4 MG disintegrating tablet Take 1 tablet (4 mg total) by mouth every 8 (eight) hours as needed for nausea or vomiting. 06/04/20   Luetta Nutting, DO  promethazine (PHENERGAN) 25 MG tablet Take 1 tablet (25 mg total) by mouth every 6 (six) hours as needed for nausea or vomiting. 10/25/19   Emeterio Reeve, DO  QUEtiapine (SEROQUEL) 50 MG tablet Take 1-2 tablets (50-100 mg total) by mouth at bedtime. 08/14/20   Luetta Nutting, DO  triamcinolone cream (KENALOG) 0.5 % Apply 1 application topically 2 (two) times daily. 09/21/20   Luetta Nutting, DO    Allergies    Toradol [ketorolac tromethamine]  Review of Systems   Review of Systems  Gastrointestinal:  Positive for abdominal pain.  All other systems reviewed and are negative.  Physical Exam Updated Vital Signs BP (!) 162/102 (BP Location: Left Arm)   Pulse (!) 129   Temp 98.3 F  (36.8 C) (Oral)   Resp (!) 28   Ht '5\' 6"'$  (1.676 m)   Wt 117 kg   LMP  (LMP Unknown)   SpO2 97%   BMI 41.64 kg/m   Physical Exam Vitals and nursing note reviewed.  45 year old female, resting comfortably and in no acute distress. Vital signs are significant for rapid heart rate, elevated blood pressure, elevated respiratory rate. Oxygen saturation is 97%, which is normal.  Head is normocephalic and atraumatic. PERRLA, EOMI. Oropharynx is clear. Neck is nontender and supple without adenopathy or JVD. Back is nontender and there is no CVA tenderness. Lungs are clear without rales, wheezes, or rhonchi. Chest is nontender. Heart is tachycardic without murmur. Abdomen is soft, flat, with moderate tenderness diffusely throughout the right lower quadrant.  There is no rebound or guarding.  There are no masses or hepatosplenomegaly and peristalsis is normoactive. Extremities have no cyanosis or edema, full range of motion is present. Skin is warm and dry without rash. Neurologic: Mental status is normal, cranial nerves are intact, there are no motor or sensory deficits.  ED Results / Procedures / Treatments   Labs (all labs ordered are listed, but only abnormal results are displayed) Labs Reviewed  CBC - Abnormal; Notable for the following components:      Result Value   RBC 5.84 (*)    Hemoglobin 16.7 (*)    HCT 47.6 (*)    All other components within normal limits  URINALYSIS, ROUTINE W REFLEX MICROSCOPIC - Abnormal; Notable for the following components:   APPearance CLOUDY (*)    Glucose, UA >=500 (*)    Ketones, ur 15 (*)    All other components within normal limits  URINALYSIS, MICROSCOPIC (REFLEX) - Abnormal; Notable for the following components:   Bacteria, UA MANY (*)    All other components within normal limits  LIPASE, BLOOD  COMPREHENSIVE METABOLIC PANEL    EKG EKG Interpretation  Date/Time:  Monday September 21 2020 19:22:53 EDT Ventricular Rate:  132 PR  Interval:  134 QRS Duration: 82 QT Interval:  390 QTC Calculation: 577 R Axis:   94 Text Interpretation: ** Critical Test Result: Long QTc Sinus tachycardia Rightward axis Borderline ECG When compared with ECG of 06/15/2019, No significant change was found Confirmed by Delora Fuel ([id number]) on 09/21/2020 10:52:37 PM  Radiology No results found.  Procedures Procedures   Medications Ordered in ED Medications - No data to display  ED Course  I have reviewed the triage vital signs and the nursing notes.  Pertinent labs & imaging results that were available during my care of the patient were reviewed by me and considered in my medical decision making (see chart for details).   MDM Rules/Calculators/A&P                         Recurrent right lower quadrant pain, most likely secondary to ovarian cyst.  She is status post appendectomy, so appendicitis is not in the differential.  Possible UTI, possible diverticulitis, possible urolithiasis.  Old records are reviewed, and she has had multiple CT scans of her abdomen and pelvis, no prior urolithiasis.  Ultrasounds have shown ovarian cyst.  Her gynecologist feels that there is scar tissue overlying the appendix making cysts painful, and plan was to start her on Lupron to induce menopause.  Abdominal exam today is benign, no indication for advanced imaging.  She will be given IV fluids, morphine, ondansetron and will recheck vital signs.  Labs showed normal WBC, metabolic panel pending.  ECG does show prolonged QT interval, will check magnesium level.  Urinalysis has many bacteria with 0-5 squamous epithelial cells and 6-10 WBCs.  Given her symptoms, will treat for UTI.  Urine culture from 8/25 showed E. coli which was sensitive to all antibiotics tested.  She is given a dose of ceftriaxone.  Magnesium level is normal.  Heart rate has come down slightly with IV  fluids, she has had partial pain relief, but far from complete pain relief.  She is she will be  given additional morphine and additional IV fluids.  She feels better following above-noted treatment.  She is discharged with prescription for small number of oxycodone-acetaminophen tablets.  Patient apparently is going to proceed with Lupron to induce menopause.  She will need to follow-up with her OB/GYN physician.  Final Clinical Impression(s) / ED Diagnoses Final diagnoses:  RLQ abdominal pain  Urinary tract infection without hematuria, site unspecified    Rx / DC Orders ED Discharge Orders          Ordered    cephALEXin (KEFLEX) 500 MG capsule  3 times daily        09/22/20 0253    oxyCODONE-acetaminophen (PERCOCET) 5-325 MG tablet  Every 4 hours PRN        09/22/20 [id number]             Delora Fuel, MD [id number] [phone]

## 2020-09-22 ENCOUNTER — Ambulatory Visit: Payer: Self-pay | Admitting: Family Medicine

## 2020-09-22 MED ORDER — OXYCODONE-ACETAMINOPHEN 5-325 MG PO TABS: 1.0000 | ORAL_TABLET | ORAL | 0 refills | Status: DC | PRN

## 2020-09-22 MED ORDER — LACTATED RINGERS IV BOLUS
1000.0000 mL | Freq: Once | INTRAVENOUS | Status: AC
  Administered 2020-09-22: 1000 mL via INTRAVENOUS

## 2020-09-22 MED ORDER — MORPHINE SULFATE (PF) 4 MG/ML IV SOLN
4.0000 mg | Freq: Once | INTRAVENOUS | Status: AC
  Administered 2020-09-22: 4 mg via INTRAVENOUS
  Filled 2020-09-22: qty 1

## 2020-09-22 MED ORDER — CEPHALEXIN 500 MG PO CAPS: 500.0000 mg | ORAL_CAPSULE | Freq: Three times a day (TID) | ORAL | 0 refills | Status: DC

## 2020-09-22 NOTE — Discharge Instructions (Addendum)
Return if pain is not being adequately controlled at home. ?

## 2020-09-23 ENCOUNTER — Telehealth: Payer: Self-pay | Admitting: General Practice

## 2020-09-23 NOTE — Telephone Encounter (Signed)
Transition Care Management Unsuccessful Follow-up Telephone Call  Date of discharge and from where:  09/22/20 from [facility]  Attempts:  1st Attempt  Reason for unsuccessful TCM follow-up call:  Left voice message

## 2020-09-24 LAB — URINE CULTURE
Culture: >=100000 — AB
MICRO NUMBER:: 12364204
SPECIMEN QUALITY:: ADEQUATE

## 2020-09-24 MED ORDER — SULFAMETHOXAZOLE-TRIMETHOPRIM 800-160 MG PO TABS
1.0000 | ORAL_TABLET | Freq: Two times a day (BID) | ORAL | 1 refills | Status: DC
Start: 2020-09-24 — End: 2021-02-27

## 2020-09-24 NOTE — Addendum Note (Signed)
Addended by: Radene Gunning A on: 09/24/2020 08:56 AM   Modules accepted: Orders

## 2020-09-25 ENCOUNTER — Telehealth: Payer: Self-pay | Admitting: Emergency Medicine

## 2020-09-25 NOTE — Telephone Encounter (Signed)
Transition Care Management Unsuccessful Follow-up Telephone Call  Date of discharge and from where:  09/22/20 from [facility]  Attempts:  2nd Attempt  Reason for unsuccessful TCM follow-up call:  Left voice message

## 2020-09-25 NOTE — Telephone Encounter (Signed)
Post ED Visit - Positive Culture Follow-up  Culture report reviewed by antimicrobial stewardship pharmacist: [place] Team '[]'$  [name], Pharm.D. '[]'$  [name], Pharm.D., BCPS AQ-ID '[]'$  [name], Pharm.D., BCPS '[]'$  [name], Pharm.D., BCPS '[]'$  [name], [place].D., BCPS, AAHIVP '[]'$  [name], Pharm.D., BCPS, AAHIVP '[]'$  [name], PharmD, BCPS '[]'$  [name], PharmD, BCPS '[]'$  [name], PharmD, BCPS '[x]'$  [name], PharmD '[]'$  [name], PharmD, BCPS '[]'$  [name], PharmD  [name] '[]'$  [name], PharmD '[]'$  [name], PharmD '[]'$  [name], PharmD '[]'$  [name], Rph '[]'$  [name]) [name], PharmD '[]'$  [name], PharmD '[]'$  [name], PharmD '[]'$  [name], PharmD '[]'$  [name], PharmD '[]'$  [name], PharmD '[]'$  [name], PharmD '[]'$  [name], PharmD '[]'$  [name], PharmD   Positive urine culture Treated with Cephalexin, organism sensitive to the same and no further patient follow-up is required at this time.  Christine Grant 09/25/2020, 4:16 PM

## 2020-09-28 NOTE — Telephone Encounter (Signed)
Transition Care Management Unsuccessful Follow-up Telephone Call  Date of discharge and from where:  09/22/20 from [facility]  Attempts:  3rd Attempt  Reason for unsuccessful TCM follow-up call:  Left voice message

## 2020-10-12 ENCOUNTER — Other Ambulatory Visit: Payer: Self-pay | Admitting: Family Medicine

## 2020-10-13 ENCOUNTER — Other Ambulatory Visit: Payer: Self-pay | Admitting: Family Medicine

## 2020-10-13 ENCOUNTER — Ambulatory Visit: Payer: Self-pay | Admitting: Family Medicine

## 2020-10-19 ENCOUNTER — Telehealth: Payer: Self-pay

## 2020-10-19 NOTE — Telephone Encounter (Signed)
Transition Care Management Follow-up Telephone Call Date of discharge and from where: 10/17/2020 from [facility] How have you been since you were released from the hospital? Pt stated that she is feeling a little rough.  Any questions or concerns? No  Items Reviewed: Did the pt receive and understand the discharge instructions provided? Yes  Medications obtained and verified? Yes  Other? No  Any new allergies since your discharge? No  Dietary orders reviewed? No Do you have support at home? Yes   Functional Questionnaire: (I = Independent and D = Dependent) ADLs: I  Bathing/Dressing- I  Meal Prep- I  Eating- I  Maintaining continence- I  Transferring/Ambulation- I  Managing Meds- I  Follow up appointments reviewed:  PCP Hospital f/u appt confirmed? No   Specialist Hospital f/u appt confirmed? No   Are transportation arrangements needed? No  If their condition worsens, is the pt aware to call PCP or go to the Emergency Dept.? Yes Was the patient provided with contact information for the PCP's office or ED? Yes Was to pt encouraged to call back with questions or concerns? Yes

## 2020-10-21 ENCOUNTER — Encounter: Payer: Self-pay | Admitting: Family Medicine

## 2020-10-22 ENCOUNTER — Encounter: Payer: Self-pay | Admitting: Family Medicine

## 2020-10-22 ENCOUNTER — Ambulatory Visit (INDEPENDENT_AMBULATORY_CARE_PROVIDER_SITE_OTHER): Payer: Self-pay | Admitting: Family Medicine

## 2020-10-22 VITALS — BP 114/83 | HR 125 | Temp 98.2°F | Ht 66.0 in | Wt 259.0 lb

## 2020-10-22 DIAGNOSIS — E669 Obesity, unspecified: Secondary | ICD-10-CM

## 2020-10-22 DIAGNOSIS — K6389 Other specified diseases of intestine: Secondary | ICD-10-CM

## 2020-10-22 DIAGNOSIS — E1169 Type 2 diabetes mellitus with other specified complication: Secondary | ICD-10-CM

## 2020-10-22 DIAGNOSIS — N3090 Cystitis, unspecified without hematuria: Secondary | ICD-10-CM

## 2020-10-22 NOTE — Telephone Encounter (Signed)
Patient scheduled for today

## 2020-10-22 NOTE — Assessment & Plan Note (Signed)
Blood sugars have not been well controlled.  Recommend reduced carbohydrate diet, increasing activity as well.  Continue current medications.

## 2020-10-22 NOTE — Assessment & Plan Note (Signed)
Concerning area noted on recent CT scan.  Referral placed urgently to gastroenterology for diagnostic colonoscopy.

## 2020-10-22 NOTE — Assessment & Plan Note (Signed)
Recent diagnosis of cystitis.  Her urinary symptoms seem to be improving at this point.  She will complete course of Bactrim.

## 2020-10-22 NOTE — Progress Notes (Signed)
Christine Grant - 45 y.o. female MRN [id number]  Date of birth: 21-Jun-1975  Subjective Chief Complaint  Patient presents with   Hospitalization Follow-up    HPI Christine Grant is a 45 y.o. female here today for follow-up from recent ER visit.  She was seen in the ER on October 7 with complaint of right lower quadrant and pelvic pain.  She had pelvic ultrasound as well as abdominal CT completed.  She was diagnosed with urinary tract infection which was treated with Bactrim.  She reports that the symptoms seem to be improving at this point.  On her CT scan there was noted to be a possible enhancing mass at the cecum.  Follow-up GI referral with colonoscopy was recommended.  She would like referral placed to GI provider within the [facility].  She has not noted any significant changes to her bowels.  No blood in her stool.  She has not had fever, chills or significant appetite change.    She reports that her blood sugars have been doing better with current medications.  ROS:  A comprehensive ROS was completed and negative except as noted per HPI  Allergies  Allergen Reactions   Toradol [Ketorolac Tromethamine] Other (See Comments)    Chest tightness, flushed     Past Medical History:  Diagnosis Date   Abnormal uterine bleeding    Anxiety    Arthritis    knees   Chronic pelvic pain in female    Depression    Endometriosis of pelvis    Fibromyalgia    History of ketoacidosis    01-15-2014   PONV (postoperative nausea and vomiting)    Sinus tachycardia    Type 2 diabetes mellitus ([place])    Wears contact lenses     Past Surgical History:  Procedure Laterality Date   ABDOMINAL HYSTERECTOMY     BREAST REDUCTION SURGERY  age 79   CESAREAN SECTION  2002,  2005,  2008   DILATION AND CURETTAGE OF UTERUS N/A 02/13/2014   Procedure: DILATATION AND CURETTAGE;  Surgeon: Everitt Amber, MD;  Location: [facility];  Service: Gynecology;  Laterality: N/A;   LEFT OOPHORECTOMY  2019    MANDIBLE SURGERY  age 68   Correct overbite   REDUCTION MAMMAPLASTY      Social History   Socioeconomic History   Marital status: Married    Spouse name: Not on file   Number of children: Not on file   Years of education: Not on file   Highest education level: Not on file  Occupational History   Not on file  Tobacco Use   Smoking status: Never   Smokeless tobacco: Never  Vaping Use   Vaping Use: Never used  Substance and Sexual Activity   Alcohol use: No   Drug use: No   Sexual activity: Yes    Birth control/protection: Surgical  Other Topics Concern   Not on file  Social History Narrative   Not on file   Social Determinants of Health   Financial Resource Strain: Not on file  Food Insecurity: Not on file  Transportation Needs: Not on file  Physical Activity: Not on file  Stress: Not on file  Social Connections: Not on file    Family History  Adopted: Yes  Problem Relation Age of Onset   Hyperlipidemia Mother    Hypertension Mother    Diabetes Mother    Breast cancer Maternal Aunt    Endometriosis Maternal [place]  Maintenance  Topic Date Due   OPHTHALMOLOGY EXAM  Never done   HIV Screening  Never done   Hepatitis C Screening  Never done   URINE MICROALBUMIN  12/23/2015   COVID-19 Vaccine (3 - Booster for Moderna series) 08/13/2019   FOOT EXAM  05/12/2021 (Originally 06/25/2019)   HEMOGLOBIN A1C  11/12/2020   TETANUS/TDAP  01/25/2023   HPV VACCINES  Aged Out     ----------------------------------------------------------------------------------------------------------------------------------------------------------------------------------------------------------------- Physical Exam BP 114/83 (BP Location: Left Arm, Patient Position: Sitting, Cuff Size: Large)   Pulse (!) 125   Temp 98.2 F (36.8 C)   Ht 5\' 6"  (1.676 m)   Wt 259 lb (117.5 kg)   LMP  (LMP Unknown)   SpO2 98%   BMI 41.80 kg/m   Physical Exam Constitutional:       Appearance: Normal appearance.  Eyes:     General: No scleral icterus. Cardiovascular:     Rate and Rhythm: Normal rate and regular rhythm.  Pulmonary:     Effort: Pulmonary effort is normal.     Breath sounds: Normal breath sounds.  Musculoskeletal:     Cervical back: Neck supple.  Neurological:     General: No focal deficit present.     Mental Status: She is alert.  Psychiatric:        Mood and Affect: Mood normal.        Behavior: Behavior normal.    ------------------------------------------------------------------------------------------------------------------------------------------------------------------------------------------------------------------- Assessment and Plan  Diabetes mellitus type 2 in obese Blood sugars have not been well controlled.  Recommend reduced carbohydrate diet, increasing activity as well.  Continue current medications.  Mass of cecum Concerning area noted on recent CT scan.  Referral placed urgently to gastroenterology for diagnostic colonoscopy.  Cystitis Recent diagnosis of cystitis.  Her urinary symptoms seem to be improving at this point.  She will complete course of Bactrim.   No orders of the defined types were placed in this encounter.   No follow-ups on file.    This visit occurred during the SARS-CoV-2 public health emergency.  Safety protocols were in place, including screening questions prior to the visit, additional usage of staff PPE, and extensive cleaning of exam room while observing appropriate contact time as indicated for disinfecting solutions.

## 2020-10-23 ENCOUNTER — Telehealth: Payer: Self-pay | Admitting: General Practice

## 2020-10-23 NOTE — Telephone Encounter (Signed)
Transition Care Management Unsuccessful Follow-up Telephone Call  Date of discharge and from where: 10/22/20 from [facility]  Attempts:  1st Attempt  Reason for unsuccessful TCM follow-up call:  Left voice message

## 2020-10-26 NOTE — Telephone Encounter (Signed)
Transition Care Management Unsuccessful Follow-up Telephone Call  Date of discharge and from where:  10/22/20 from [facility]  Attempts:  2nd Attempt  Reason for unsuccessful TCM follow-up call:  Left voice message

## 2020-10-28 NOTE — Telephone Encounter (Signed)
Transition Care Management Unsuccessful Follow-up Telephone Call  Date of discharge and from where:  10/22/20 from [facility]  Attempts:  3rd Attempt  Reason for unsuccessful TCM follow-up call:  Left voice message

## 2020-10-29 LAB — HM COLONOSCOPY

## 2020-10-30 ENCOUNTER — Encounter: Payer: Self-pay | Admitting: Family Medicine

## 2020-11-02 ENCOUNTER — Telehealth: Payer: Self-pay | Admitting: General Practice

## 2020-11-02 NOTE — Telephone Encounter (Signed)
Transition Care Management Unsuccessful Follow-up Telephone Call  Date of discharge and from where:  10/30/20 from [facility]  Attempts:  1st Attempt  Reason for unsuccessful TCM follow-up call:  No answer/busy

## 2020-11-04 ENCOUNTER — Other Ambulatory Visit: Payer: Self-pay | Admitting: Family Medicine

## 2020-11-04 NOTE — Telephone Encounter (Signed)
Transition Care Management Unsuccessful Follow-up Telephone Call  Date of discharge and from where:  10/30/20 from [facility]  Attempts:  2nd Attempt  Reason for unsuccessful TCM follow-up call:  Left voice message

## 2020-11-05 ENCOUNTER — Ambulatory Visit (INDEPENDENT_AMBULATORY_CARE_PROVIDER_SITE_OTHER): Payer: Self-pay | Admitting: Obstetrics & Gynecology

## 2020-11-05 ENCOUNTER — Encounter: Payer: Self-pay | Admitting: Obstetrics & Gynecology

## 2020-11-05 ENCOUNTER — Other Ambulatory Visit: Payer: Self-pay

## 2020-11-05 VITALS — BP 155/94 | HR 119 | Ht 66.0 in | Wt 261.0 lb

## 2020-11-05 DIAGNOSIS — N736 Female pelvic peritoneal adhesions (postinfective): Secondary | ICD-10-CM

## 2020-11-05 DIAGNOSIS — N83201 Unspecified ovarian cyst, right side: Secondary | ICD-10-CM

## 2020-11-05 HISTORY — DX: Female pelvic peritoneal adhesions (postinfective): N73.6

## 2020-11-05 NOTE — Progress Notes (Signed)
GYNECOLOGY OFFICE VISIT NOTE  History:   Christine Grant is a 45 y.o. [phone] with long history of chronic pelvic pain, right ovarian cyst and pelvic adhesive disease here today for follow up after recent ER MRI scan showed possible ovarian cyst nodule. She and her husband are worried about neoplasm. She still reports constant pain, still considering Lupron therapy as discussed during her last visit with Dr. Damita Dunnings on 09/21/2020.  She denies any abnormal vaginal discharge, bleeding or other concerns.    Past Medical History:  Diagnosis Date   Abnormal uterine bleeding    Anxiety    Arthritis    knees   Chronic pelvic pain in female    Depression    Endometriosis of pelvis    Fibromyalgia    History of ketoacidosis    01-15-2014   PONV (postoperative nausea and vomiting)    Sinus tachycardia    Type 2 diabetes mellitus ([place])    Wears contact lenses     Past Surgical History:  Procedure Laterality Date   ABDOMINAL HYSTERECTOMY     BREAST REDUCTION SURGERY  age 45   CESAREAN SECTION  2002,  2005,  2008   DILATION AND CURETTAGE OF UTERUS N/A 02/13/2014   Procedure: DILATATION AND CURETTAGE;  Surgeon: Everitt Amber, MD;  Location: [facility];  Service: Gynecology;  Laterality: N/A;   LEFT OOPHORECTOMY  2019   MANDIBLE SURGERY  age 89   Correct overbite   REDUCTION MAMMAPLASTY      The following portions of the patient's history were reviewed and updated as appropriate: allergies, current medications, past family history, past medical history, past social history, past surgical history and problem list.    Review of Systems:  Pertinent items noted in HPI and remainder of comprehensive ROS otherwise negative.  Physical Exam:  BP (!) 155/94   Pulse (!) 119   Ht 5\' 6"  (1.676 m)   Wt 261 lb (118.4 kg)   LMP  (LMP Unknown)   BMI 42.13 kg/m  CONSTITUTIONAL: Well-developed, well-nourished female in no acute distress.  HEENT:  Normocephalic, atraumatic. External  right and left ear normal. No scleral icterus.  NECK: Normal range of motion, supple, no masses noted on observation SKIN: No rash noted. Not diaphoretic. No erythema. No pallor. MUSCULOSKELETAL: Normal range of motion. No edema noted. NEUROLOGIC: Alert and oriented to person, place, and time. Normal muscle tone coordination. No cranial nerve deficit noted. PSYCHIATRIC: Normal mood and affect. Normal behavior. Normal judgment and thought content. CARDIOVASCULAR: Normal heart rate noted RESPIRATORY: Effort and breath sounds normal, no problems with respiration noted ABDOMEN: Obese, No masses noted. No other overt distention noted.   PELVIC: Deferred  Imaging 10/30/2020  MRI Pelvis done at [facility] (copied and pasted from [facility]) Formatting of this note might be different from the original.  TECHNIQUE: Multiplanar multisequence MR the pelvis performed with and without IV contrast. 10 mL of gadolinium was administered intravenously.  COMPARISON: Multiple prior CTs and ultrasounds dating back to 2019  INDICATION: Right lower quadrant mass on CT query ovarian vs other.  normal colonoscopy  FINDINGS:  Uterus: Absent.  Left ovary: Absent.  Right ovary. Ovary measures 3.5 x 1.8 x 2.0 cm. Multiple follicles are present. Within the inferior aspect of the ovary, there is an involuting ovarian cyst with typical peripheral enhancement measuring 2 cm and likely physiologic. Within the superior aspect of the ovary, there is a T1/T2 hypointense lesion measuring 1.8 x 2.0 cm (image 15 series 3);  demonstrating diffuse enhancement postcontrast. This nodule appears to be within the ovary which closely abuts the cecum but does not appear to involve the cecal wall.  In retrospect review of prior CTs, this nodule appears to have been present since 2020 and slightly hyperdense as compared to the surrounding ovarian tissue.  Pelvis: Unremarkable bladder. Free fluid in the pelvis.  GI: Unremarkable cecum. No  bowel obstruction.  MSK: Postop change within the right in midline abdominal wall. Within the sacrum, there is a T2 hyperintense lesion with fat suppression, likely hemangioma. Unremarkable bone marrow signal.  IMPRESSION:  Enhancing nodule within the right ovary, could represent a small ovarian neoplasm. Differential diagnosis includes but felt to be less likely an endometrioma as there is no hyperintensity on precontrast imaging. If no surgical intervention warranted, further follow-up can be obtained in 6 months to document stability.  Physiologic involuting right ovarian cyst.  No definite cecal or GI mass.  Electronically Signed by: Tomma Lightning on 10/30/2020 3:20 PM      Assessment and Plan:     1. Right ovarian cyst As per report, nodule has been present since 2020, unchanged. This is reassuring.  Never had CA-125 checked, will do so now which hopefully will reassure patient.  MRI report reviewed in detail with patient and her husband, all questions answered.  - CA 125; Future Patient will let us know when she decides to proceed with Lupron.  Surgery is not an option for management for her given her complex history with pelvic adhesive disease.  She will continue pain medications as needed.    Routine preventative health maintenance measures emphasized. Please refer to After Visit Summary for other counseling recommendations.   Return for any gynecologic concerns.    I spent 20 minutes dedicated to the care of this patient including pre-visit review of records, face to face time with the patient discussing her conditions and treatments and post visit orders.    Verita Schneiders, MD, [place] for [employer], [place]

## 2020-11-06 NOTE — Telephone Encounter (Signed)
Transition Care Management Unsuccessful Follow-up Telephone Call  Date of discharge and from where:  10/30/20 from [facility]  Attempts:  3rd Attempt  Reason for unsuccessful TCM follow-up call:  Left voice message

## 2020-12-07 ENCOUNTER — Other Ambulatory Visit: Payer: Self-pay | Admitting: Family Medicine

## 2020-12-15 ENCOUNTER — Other Ambulatory Visit: Payer: Self-pay | Admitting: Family Medicine

## 2020-12-17 ENCOUNTER — Other Ambulatory Visit: Payer: Self-pay | Admitting: Family Medicine

## 2020-12-17 MED ORDER — ONDANSETRON 4 MG PO TBDP: 4.0000 mg | ORAL_TABLET | Freq: Three times a day (TID) | ORAL | 0 refills | Status: DC | PRN

## 2020-12-18 ENCOUNTER — Other Ambulatory Visit: Payer: Self-pay | Admitting: Osteopathic Medicine

## 2020-12-18 ENCOUNTER — Telehealth: Payer: Self-pay | Admitting: General Practice

## 2020-12-18 DIAGNOSIS — E1159 Type 2 diabetes mellitus with other circulatory complications: Secondary | ICD-10-CM

## 2020-12-18 DIAGNOSIS — I152 Hypertension secondary to endocrine disorders: Secondary | ICD-10-CM

## 2020-12-18 NOTE — Telephone Encounter (Signed)
Transition Care Management Unsuccessful Follow-up Telephone Call  Date of discharge and from where:  12/17/20 from [facility]  Attempts:  1st Attempt  Reason for unsuccessful TCM follow-up call:  Left voice message

## 2020-12-21 NOTE — Telephone Encounter (Signed)
Transition Care Management Unsuccessful Follow-up Telephone Call  Date of discharge and from where:  12/17/20 from [facility]  Attempts:  2nd Attempt  Reason for unsuccessful TCM follow-up call:  Left voice message

## 2020-12-23 NOTE — Telephone Encounter (Signed)
Transition Care Management Unsuccessful Follow-up Telephone Call  Date of discharge and from where:  12/17/20 from [facility]  Attempts:  3rd Attempt  Reason for unsuccessful TCM follow-up call:  Left voice message

## 2021-01-14 ENCOUNTER — Other Ambulatory Visit: Payer: Self-pay | Admitting: Family Medicine

## 2021-01-14 DIAGNOSIS — G8929 Other chronic pain: Secondary | ICD-10-CM

## 2021-01-18 ENCOUNTER — Other Ambulatory Visit: Payer: Self-pay | Admitting: Family Medicine

## 2021-01-18 DIAGNOSIS — I152 Hypertension secondary to endocrine disorders: Secondary | ICD-10-CM

## 2021-01-20 ENCOUNTER — Telehealth: Payer: Self-pay | Admitting: General Practice

## 2021-01-20 NOTE — Telephone Encounter (Signed)
Transition Care Management Unsuccessful Follow-up Telephone Call  Date of discharge and from where:  01/17/21 from [facility]  Attempts:  1st Attempt  Reason for unsuccessful TCM follow-up call:  Left voice message

## 2021-01-22 NOTE — Telephone Encounter (Signed)
Transition Care Management Unsuccessful Follow-up Telephone Call  Date of discharge and from where:  01/17/21 from [facility]  Attempts:  2nd Attempt  Reason for unsuccessful TCM follow-up call:  No answer/busy

## 2021-01-24 ENCOUNTER — Emergency Department (HOSPITAL_COMMUNITY)
Admission: EM | Admit: 2021-01-24 | Discharge: 2021-01-25 | Disposition: A | Discharge status: Home / Self Care | Payer: Managed Care, Other (non HMO) | Attending: Emergency Medicine | Admitting: Emergency Medicine

## 2021-01-24 ENCOUNTER — Encounter (HOSPITAL_COMMUNITY): Payer: Self-pay

## 2021-01-24 ENCOUNTER — Other Ambulatory Visit: Payer: Self-pay

## 2021-01-24 DIAGNOSIS — R112 Nausea with vomiting, unspecified: Secondary | ICD-10-CM | POA: Diagnosis not present

## 2021-01-24 DIAGNOSIS — E119 Type 2 diabetes mellitus without complications: Secondary | ICD-10-CM | POA: Insufficient documentation

## 2021-01-24 DIAGNOSIS — R1031 Right lower quadrant pain: Secondary | ICD-10-CM | POA: Insufficient documentation

## 2021-01-24 DIAGNOSIS — Z79899 Other long term (current) drug therapy: Secondary | ICD-10-CM | POA: Diagnosis not present

## 2021-01-24 DIAGNOSIS — Z794 Long term (current) use of insulin: Secondary | ICD-10-CM | POA: Insufficient documentation

## 2021-01-24 LAB — URINALYSIS, ROUTINE W REFLEX MICROSCOPIC
Bacteria, UA: NONE SEEN
Bilirubin Urine: NEGATIVE
Glucose, UA: >=500 mg/dL — AB
Hgb urine dipstick: NEGATIVE
Ketones, ur: 5 mg/dL — AB
Leukocytes,Ua: NEGATIVE
Nitrite: NEGATIVE
Protein, ur: NEGATIVE mg/dL
Specific Gravity, Urine: 1.028 (ref 1.005–1.030)
pH: 5 (ref 5.0–8.0)

## 2021-01-24 LAB — CBC WITH DIFFERENTIAL/PLATELET
Abs Immature Granulocytes: 0.05 10*3/uL (ref 0.00–0.07)
Basophils Absolute: 0.1 10*3/uL (ref 0.0–0.1)
Basophils Relative: 1 %
Eosinophils Absolute: 0.5 10*3/uL (ref 0.0–0.5)
Eosinophils Relative: 5 %
HCT: 48.5 % — ABNORMAL HIGH (ref 36.0–46.0)
Hemoglobin: 16.5 g/dL — ABNORMAL HIGH (ref 12.0–15.0)
Immature Granulocytes: 1 %
Lymphocytes Relative: 29 %
Lymphs Abs: 2.4 10*3/uL (ref 0.7–4.0)
MCH: 28.1 pg (ref 26.0–34.0)
MCHC: 34 g/dL (ref 30.0–36.0)
MCV: 82.6 fL (ref 80.0–100.0)
Monocytes Absolute: 0.5 10*3/uL (ref 0.1–1.0)
Monocytes Relative: 6 %
Neutro Abs: 4.8 10*3/uL (ref 1.7–7.7)
Neutrophils Relative %: 58 %
Platelets: 260 10*3/uL (ref 150–400)
RBC: 5.87 MIL/uL — ABNORMAL HIGH (ref 3.87–5.11)
RDW: 12.6 % (ref 11.5–15.5)
WBC: 8.3 10*3/uL (ref 4.0–10.5)
nRBC: 0 % (ref 0.0–0.2)

## 2021-01-24 LAB — COMPREHENSIVE METABOLIC PANEL
ALT: 23 U/L (ref 0–44)
AST: 29 U/L (ref 15–41)
Albumin: 4.3 g/dL (ref 3.5–5.0)
Alkaline Phosphatase: 66 U/L (ref 38–126)
Anion gap: 10 (ref 5–15)
BUN: 10 mg/dL (ref 6–20)
CO2: 22 mmol/L (ref 22–32)
Calcium: 9.3 mg/dL (ref 8.9–10.3)
Chloride: 99 mmol/L (ref 98–111)
Creatinine, Ser: 0.54 mg/dL (ref 0.44–1.00)
GFR, Estimated: >60 mL/min (ref >60)
Glucose, Bld: 362 mg/dL — ABNORMAL HIGH (ref 70–99)
Potassium: 4.9 mmol/L (ref 3.5–5.1)
Sodium: 131 mmol/L — ABNORMAL LOW (ref 135–145)
Total Bilirubin: 1.4 mg/dL — ABNORMAL HIGH (ref 0.3–1.2)
Total Protein: 7.4 g/dL (ref 6.5–8.1)

## 2021-01-24 LAB — LIPASE, BLOOD: Lipase: 24 U/L (ref 11–51)

## 2021-01-24 MED ORDER — HYDROCODONE-ACETAMINOPHEN 5-325 MG PO TABS
1.0000 | ORAL_TABLET | Freq: Once | ORAL | Status: AC
  Administered 2021-01-24: 1 via ORAL
  Filled 2021-01-24: qty 1

## 2021-01-24 MED ORDER — ONDANSETRON HCL 4 MG/2ML IJ SOLN
4.0000 mg | Freq: Once | INTRAMUSCULAR | Status: AC
  Administered 2021-01-24: 4 mg via INTRAVENOUS
  Filled 2021-01-24: qty 2

## 2021-01-24 MED ORDER — SODIUM CHLORIDE 0.9 % IV SOLN
12.5000 mg | Freq: Once | INTRAVENOUS | Status: AC
  Administered 2021-01-24: 12.5 mg via INTRAVENOUS
  Filled 2021-01-24: qty 12.5

## 2021-01-24 MED ORDER — MORPHINE SULFATE (PF) 2 MG/ML IV SOLN
2.0000 mg | Freq: Once | INTRAVENOUS | Status: AC
  Administered 2021-01-24: 2 mg via INTRAVENOUS
  Filled 2021-01-24: qty 1

## 2021-01-24 MED ORDER — LACTATED RINGERS IV BOLUS
1000.0000 mL | Freq: Once | INTRAVENOUS | Status: AC
  Administered 2021-01-24: 1000 mL via INTRAVENOUS

## 2021-01-24 MED ORDER — MORPHINE SULFATE (PF) 4 MG/ML IV SOLN
4.0000 mg | Freq: Once | INTRAVENOUS | Status: AC
  Administered 2021-01-24: 4 mg via INTRAVENOUS
  Filled 2021-01-24: qty 1

## 2021-01-24 MED ORDER — LACTATED RINGERS IV BOLUS
1000.0000 mL | Freq: Once | INTRAVENOUS | Status: AC
Start: 2021-01-24 — End: 2021-01-24
  Administered 2021-01-24: 1000 mL via INTRAVENOUS

## 2021-01-24 MED ORDER — IBUPROFEN 200 MG PO TABS
600.0000 mg | ORAL_TABLET | Freq: Once | ORAL | Status: AC
  Administered 2021-01-24: 600 mg via ORAL
  Filled 2021-01-24: qty 3

## 2021-01-24 NOTE — ED Triage Notes (Addendum)
Pt states that she has a growth on her right ovary and is to see her GYN soon for an appt. Pt reports with RLQ abdominal pain and vomiting since today. Pt reports having an hysterectomy and only has her right ovary.

## 2021-01-24 NOTE — ED Provider Notes (Signed)
[facility] DEPT Provider Note   CSN: [id number] Arrival date & time: 01/24/21  1859     History  Chief Complaint  Patient presents with   Abdominal Pain   Emesis    Christine Grant is a 46 y.o. female with a past medical history of sinus tachycardia, fibromyalgia, DM 2, status post appendectomy, hysterectomy, left-sided unilateral salpingo-oophorectomy who presents today for evaluation of worsening RLQ pain.  She has been seen for this multiple times over the past few months at multiple facilities.   She was most recently seen 01/17/2021 at [facility] with her associated nausea vomiting and spotting. She states that the pain is worsened especially over the past week.  She is having multiple episodes of vomiting.  She attributes the vomiting to the pain.  Pain is in the same location as usual.    She states that she usually alternates ibuprofen and Tylenol, the last time she had any Tylenol was over 10 hours ago.    She has a known cyst on the ovary and has a appointment with GYN on the 27th.  There is concerned that this may be a cancerous mass according to patient.  She states that the pain is otherwise consistent with her usual pain.  HPI     Home Medications Prior to Admission medications   Medication Sig Start Date End Date Taking? Authorizing Provider  acetaminophen (TYLENOL) 500 MG tablet Take 1,000 mg by mouth every 6 (six) hours as needed for moderate pain.   Yes [provider]  ALPRAZolam (XANAX XR) 1 MG 24 hr tablet Take 1 tablet (1 mg total) by mouth daily. Patient taking differently: Take 1 mg by mouth daily as needed for anxiety. 08/17/20  Yes Luetta Nutting, DO  cyclobenzaprine (FLEXERIL) 10 MG tablet TAKE 1 TABLET BY MOUTH THREE TIMES A DAY AS NEEDED FOR MUSCLE SPASMS Patient taking differently: Take 10 mg by mouth 3 (three) times daily. 01/14/21  Yes Luetta Nutting, DO  dapagliflozin propanediol (FARXIGA) 10 MG TABS  tablet Take 1 tablet (10 mg total) by mouth daily. 08/14/20  Yes Luetta Nutting, DO  gabapentin (NEURONTIN) 600 MG tablet TAKE 2 TABLETS (1,200 MG TOTAL) BY MOUTH 3 (THREE) TIMES DAILY. 11/04/20 02/02/21 Yes Matthews, Einar Pheasant, DO  insulin detemir (LEVEMIR FLEXTOUCH) 100 UNIT/ML FlexPen Inject 26 Units into the skin daily. Patient taking differently: Inject 32 Units into the skin daily. 08/14/20 01/24/21 Yes Luetta Nutting, DO  metoprolol succinate (TOPROL-XL) 25 MG 24 hr tablet TAKE 1 TABLET (25 MG TOTAL) BY MOUTH DAILY. 01/18/21 04/18/21 Yes Matthews, Cody, DO  ondansetron (ZOFRAN ODT) 4 MG disintegrating tablet Take 1 tablet (4 mg total) by mouth every 8 (eight) hours as needed for nausea or vomiting. 12/17/20  Yes Luetta Nutting, DO  QUEtiapine (SEROQUEL) 50 MG tablet Take 1-2 tablets (50-100 mg total) by mouth at bedtime. Patient taking differently: Take 50 mg by mouth daily as needed (sleep). 08/14/20  Yes Luetta Nutting, DO  traMADol (ULTRAM) 50 MG tablet TAKE 1 TABLET BY MOUTH EVERY DAY AS NEEDED Patient taking differently: Take 50 mg by mouth daily as needed for moderate pain. 12/08/20  Yes Luetta Nutting, DO  ALPRAZolam Duanne Moron) 1 MG tablet TAKE 1 TABLET BY MOUTH IN THE AFTERNOON AS NEEDED FOR ANXIETY Patient not taking: Reported on 01/24/2021 12/15/20   Luetta Nutting, DO  Continuous Blood Gluc Sensor (FREESTYLE LIBRE 2 SENSOR) MISC Apply new device every 14 days to monitor glucose. 07/08/20   Luetta Nutting,  DO  Dulaglutide (TRULICITY) 3 MC/9.4BS SOPN Inject 3 mg as directed once a week. Patient not taking: Reported on 01/24/2021 08/14/20 11/12/20  Luetta Nutting, DO  Insulin Pen Needle (ADVOCATE INSULIN PEN NEEDLES) 33G X 4 MM MISC 1 each by Does not apply route daily. 10/01/18   Gregor Hams, MD  promethazine (PHENERGAN) 25 MG tablet Take 1 tablet (25 mg total) by mouth every 6 (six) hours as needed for nausea or vomiting. Patient not taking: Reported on 01/24/2021 10/25/19   Emeterio Reeve, DO   sulfamethoxazole-trimethoprim (BACTRIM DS) 800-160 MG tablet Take 1 tablet by mouth 2 (two) times daily. Patient not taking: Reported on 01/24/2021 09/24/20   Radene Gunning, MD  triamcinolone cream (KENALOG) 0.5 % Apply 1 application topically 2 (two) times daily. Patient not taking: Reported on 01/24/2021 09/21/20   Luetta Nutting, DO      Allergies    Toradol [ketorolac tromethamine]    Review of Systems   Review of Systems See HPI Physical Exam Updated Vital Signs BP 134/86    Pulse (!) 117    Temp 97.8 F (36.6 C) (Oral)    Resp (!) 21    Ht 5\' 6"  (1.676 m)    Wt 115.7 kg    LMP  (LMP Unknown)    SpO2 91%    BMI 41.16 kg/m  Physical Exam Vitals and nursing note reviewed.  Constitutional:      General: She is not in acute distress.    Appearance: She is not ill-appearing.  HENT:     Head: Normocephalic and atraumatic.  Eyes:     Conjunctiva/sclera: Conjunctivae normal.  Cardiovascular:     Rate and Rhythm: Regular rhythm. Tachycardia present.  Pulmonary:     Effort: Pulmonary effort is normal. No respiratory distress.  Abdominal:     General: There is no distension.     Tenderness: There is abdominal tenderness in the right lower quadrant.     Hernia: No hernia is present.  Musculoskeletal:     Cervical back: Normal range of motion and neck supple.     Comments: No obvious acute injury  Skin:    General: Skin is warm.  Neurological:     Mental Status: She is alert.     Comments: Awake and alert, answers all questions appropriately.  Speech is not slurred.  Psychiatric:        Mood and Affect: Mood normal.        Behavior: Behavior normal.    ED Results / Procedures / Treatments   Labs (all labs ordered are listed, but only abnormal results are displayed) Labs Reviewed  CBC WITH DIFFERENTIAL/PLATELET - Abnormal; Notable for the following components:      Result Value   RBC 5.87 (*)    Hemoglobin 16.5 (*)    HCT 48.5 (*)    All other components within normal  limits  COMPREHENSIVE METABOLIC PANEL - Abnormal; Notable for the following components:   Sodium 131 (*)    Glucose, Bld 362 (*)    Total Bilirubin 1.4 (*)    All other components within normal limits  URINALYSIS, ROUTINE W REFLEX MICROSCOPIC - Abnormal; Notable for the following components:   Color, Urine STRAW (*)    Glucose, UA >=500 (*)    Ketones, ur 5 (*)    All other components within normal limits  LIPASE, BLOOD    EKG None  Radiology No results found.  Procedures Procedures    Medications Ordered in ED  Medications  lactated ringers bolus 1,000 mL (0 mLs Intravenous Stopped 01/24/21 2148)  morphine 4 MG/ML injection 4 mg (4 mg Intravenous Given 01/24/21 1955)  ondansetron (ZOFRAN) injection 4 mg (4 mg Intravenous Given 01/24/21 1955)  ibuprofen (ADVIL) tablet 600 mg (600 mg Oral Given 01/24/21 2156)  HYDROcodone-acetaminophen (NORCO/VICODIN) 5-325 MG per tablet 1 tablet (1 tablet Oral Given 01/24/21 2157)  lactated ringers bolus 1,000 mL (0 mLs Intravenous Stopped 01/24/21 2355)  morphine 2 MG/ML injection 2 mg (2 mg Intravenous Given 01/24/21 2243)  promethazine (PHENERGAN) 12.5 mg in sodium chloride 0.9 % 50 mL IVPB (0 mg Intravenous Stopped 01/25/21 0010)    ED Course/ Medical Decision Making/ A&P Clinical Course as of 01/25/21 0039  Sun Jan 24, 2021  2237 Patient reevaluated, she states that after the first round of meds her pain improved from a 9 out of 10 down to a 8 out of 10.  She is currently at 7 out of 10. [EH]  2238 She states that usually she requires 2 L of IV fluids and 2 g of pain medication to break her pain.  Will order second round of fluids. [EH]    Clinical Course User Index [EH] Lorin Glass, PA-C                           Medical Decision Making Patient is a 46 year old woman who presents today for evaluation of acute on chronic right lower quadrant abdominal pain.  She is scheduled to see GYN soon for this however states the pain is  unbearable to the point that she is vomiting from the pain.  She states that the pain is the same location as her chronic abdominal pain, and feels the same. She is afebrile.   Amount and/or Complexity of Data Reviewed Independent Historian: spouse External Data Reviewed: labs, radiology and notes.    Details: [facility] visit on 12/17/20 and 01/17/21 Labs: ordered.    Details: Hyperglycemia with a glucose of 362.  CMP without significant leukocytosis.  Lipase is not elevated.  UA without evidence of infection. Radiology:     Details: Radiology was considered.  I discussed options for this with the patient.  She only has 1 ovary, has already had a hysterectomy and the other ovary has been removed.  Her ovaries previously been adherent to either bowel wall or peritoneum making this most likely harder to identify on ultrasound. As her pain is the same location and type we discussed risk benefits and alternatives for ultrasound versus CT scan patient declined.  Risk Prescription drug management. Parenteral controlled substances.    Patient got two rounds of IV pain medications, PO pain medication and IV antiemetics, and IV fluids.  After she felt better and was agreeable to discharge home.   Return precautions were discussed with patient who states their understanding.  At the time of discharge patient denied any unaddressed complaints or concerns.  Patient is agreeable for discharge home.  Note: Portions of this report may have been transcribed using voice recognition software. Every effort was made to ensure accuracy; however, inadvertent computerized transcription errors may be present.    Final Clinical Impression(s) / ED Diagnoses Final diagnoses:  Right lower quadrant abdominal pain  Nausea and vomiting, unspecified vomiting type    Rx / DC Orders ED Discharge Orders     None         Lorin Glass, PA-C 01/25/21 [place],  Wille Glaser, MD 01/25/21 1505

## 2021-01-25 NOTE — Discharge Instructions (Addendum)
Please take Ibuprofen (Advil, motrin) and Tylenol (acetaminophen) to relieve your pain.    You may take up to 600 MG (3 pills) of normal strength ibuprofen every 8 hours as needed.   You make take tylenol, up to 1,000 mg (two extra strength pills) every 8 hours as needed.   It is safe to take ibuprofen and tylenol at the same time as they work differently.   Do not take more than 3,000 mg tylenol in a 24 hour period (not more than one dose every 8 hours.  Please check all medication labels as many medications such as pain and cold medications may contain tylenol.  Do not drink alcohol while taking these medications.  Do not take other NSAID'S while taking ibuprofen (such as aleve or naproxen).  Please take ibuprofen with food to decrease stomach upset.  Today you received medications that may make you sleepy or impair your ability to make decisions.  For the next 24 hours please do not drive, operate heavy machinery, care for a small child with out another adult present, or perform any activities that may cause harm to you or someone else if you were to fall asleep or be impaired.

## 2021-01-25 NOTE — ED Notes (Signed)
Pt reports she feels better. Pt verbalized understanding of d/c instructions and follow up care. Pt A&Ox4, ambulatory with independent steady gait

## 2021-01-27 ENCOUNTER — Telehealth: Payer: Self-pay | Admitting: General Practice

## 2021-01-27 NOTE — Telephone Encounter (Signed)
Transition Care Management Unsuccessful Follow-up Telephone Call  Date of discharge and from where:  01/17/21 from [facility]  Attempts:  3rd Attempt  Reason for unsuccessful TCM follow-up call:  No answer/busy

## 2021-01-27 NOTE — Telephone Encounter (Signed)
Transition Care Management Unsuccessful Follow-up Telephone Call  Date of discharge and from where:  01/25/21 from [facility]  Attempts:  1st Attempt  Reason for unsuccessful TCM follow-up call:  No answer/busy

## 2021-01-29 NOTE — Telephone Encounter (Signed)
Transition Care Management Unsuccessful Follow-up Telephone Call  Date of discharge and from where:  01/25/21 from [facility]  Attempts:  2nd Attempt  Reason for unsuccessful TCM follow-up call:  Unable to reach patient

## 2021-02-01 NOTE — Telephone Encounter (Signed)
Transition Care Management Unsuccessful Follow-up Telephone Call  Date of discharge and from where:  01/25/21 from [facility]  Attempts:  3rd Attempt  Reason for unsuccessful TCM follow-up call:  Unable to reach patient

## 2021-02-05 ENCOUNTER — Other Ambulatory Visit: Payer: Self-pay | Admitting: Family Medicine

## 2021-02-05 ENCOUNTER — Ambulatory Visit: Payer: Managed Care, Other (non HMO) | Admitting: Obstetrics and Gynecology

## 2021-02-05 ENCOUNTER — Encounter: Payer: Self-pay | Admitting: Obstetrics and Gynecology

## 2021-02-05 ENCOUNTER — Other Ambulatory Visit: Payer: Self-pay

## 2021-02-05 VITALS — BP 171/92 | HR 131 | Wt 251.6 lb

## 2021-02-05 DIAGNOSIS — Z1231 Encounter for screening mammogram for malignant neoplasm of breast: Secondary | ICD-10-CM | POA: Diagnosis not present

## 2021-02-05 DIAGNOSIS — N83299 Other ovarian cyst, unspecified side: Secondary | ICD-10-CM

## 2021-02-05 MED ORDER — ONDANSETRON 4 MG PO TBDP: 4.0000 mg | ORAL_TABLET | Freq: Three times a day (TID) | ORAL | 0 refills | Status: DC | PRN

## 2021-02-05 MED ORDER — LEUPROLIDE ACETATE 3.75 MG IM KIT
3.7500 mg | PACK | INTRAMUSCULAR | 2 refills | Status: DC
Start: 2021-02-05 — End: 2021-03-18

## 2021-02-05 MED ORDER — OXYCODONE HCL 5 MG PO TABS: 5.0000 mg | ORAL_TABLET | ORAL | 0 refills | Status: DC | PRN

## 2021-02-05 NOTE — Progress Notes (Signed)
GYNECOLOGY OFFICE VISIT NOTE  History:   Tarren Leamer is a 46 y.o. [phone] here today for continued right lower quadrant pain but wanted a second opinion on gyn onc referral based on more recent imaging.    She denies any abnormal vaginal discharge, bleeding, pelvic pain or other concerns.     Past Medical History:  Diagnosis Date   Abnormal uterine bleeding    Anxiety    Arthritis    knees   Chronic pelvic pain in female    Depression    Endometriosis of pelvis    Fibromyalgia    History of ketoacidosis    01-15-2014   PONV (postoperative nausea and vomiting)    Sinus tachycardia    Type 2 diabetes mellitus ([place])    Wears contact lenses     Past Surgical History:  Procedure Laterality Date   ABDOMINAL HYSTERECTOMY     BREAST REDUCTION SURGERY  age 64   CESAREAN SECTION  2002,  2005,  2008   DILATION AND CURETTAGE OF UTERUS N/A 02/13/2014   Procedure: DILATATION AND CURETTAGE;  Surgeon: Everitt Amber, MD;  Location: [facility];  Service: Gynecology;  Laterality: N/A;   LEFT OOPHORECTOMY  2019   MANDIBLE SURGERY  age 44   Correct overbite   REDUCTION MAMMAPLASTY      The following portions of the patient's history were reviewed and updated as appropriate: allergies, current medications, past family history, past medical history, past social history, past surgical history and problem list.   Health Maintenance:   S/p hyst Due for mammogram   Review of Systems:  Pertinent items noted in HPI and remainder of comprehensive ROS otherwise negative.  Physical Exam:  Wt 251 lb 9.6 oz (114.1 kg)    LMP  (LMP Unknown)    BMI 40.61 kg/m  CONSTITUTIONAL: Well-developed, well-nourished female in no acute distress.  HEENT:  Normocephalic, atraumatic. External right and left ear normal. No scleral icterus.  NECK: Normal range of motion, supple, no masses noted on observation SKIN: No rash noted. Not diaphoretic. No erythema. No pallor. MUSCULOSKELETAL: Normal  range of motion. No edema noted. NEUROLOGIC: Alert and oriented to person, place, and time. Normal muscle tone coordination. No cranial nerve deficit noted. PSYCHIATRIC: Normal mood and affect. Normal behavior. Normal judgment and thought content.  CARDIOVASCULAR: Normal heart rate noted RESPIRATORY: Effort and breath sounds normal, no problems with respiration noted ABDOMEN: No masses noted. No other overt distention noted.    PELVIC: Deferred  Labs and Imaging 10/2019: MRI: report reviewed: Enhancing nodule within the right ovary, could represent a small ovarian neoplasm. Differential diagnosis includes but felt to be less likely an endometrioma as there is no hyperintensity on precontrast imaging. If no surgical intervention warranted, further follow-up can be obtained in 6 months to document stability. Physiologic involuting right ovarian cyst. No definite cecal or GI mass. This nodule appears to be within the ovary which closely abuts the cecum but does not appear to involve the cecal wall.  12/17/20: CT report reviewed: There is an enhancing area within the right ovary again noted. This could be due to an endometrioma, hemorrhagic cyst, or a mass. This was seen on recent imaging. Gynecologic oncologic referral will likely be warranted as this has persisted for prior imaging. There is an area of increased density at the right adnexa measuring at least 2.3 x 1.5 cm. Assessment and Plan:   1. Complex ovarian cyst - Recommended gyn onc referral following review of  imaging of MRI and CT reports from [facility].  - May also try depo lupron in the interim - they would like to do this - Short term oxycodone to avoid ER trips, zofran from nausea. Reviewed risks of opioid - MXR rx given  Routine preventative health maintenance measures emphasized. Please refer to After Visit Summary for other counseling recommendations.   No follow-ups on file.  Radene Gunning, MD, [place] for [facility], [place]

## 2021-02-05 NOTE — Progress Notes (Signed)
Patient has elevated GAD7. Patient declines [place].

## 2021-02-05 NOTE — Progress Notes (Signed)
Patient mammogram scheduled for 02/09/21 at 3:30 PM at the [facility]. Patient notified.

## 2021-02-09 ENCOUNTER — Ambulatory Visit: Payer: Managed Care, Other (non HMO)

## 2021-02-10 ENCOUNTER — Telehealth: Payer: Self-pay | Admitting: *Deleted

## 2021-02-10 NOTE — Telephone Encounter (Signed)
Attempted to reach the patient with no answer, LMOM to call the office back

## 2021-02-12 NOTE — Telephone Encounter (Signed)
Spoke with the patient's husband and scheduled a new patient appt with Dr Berline Lopes on 2/17 at 29 am. Husband given an arrival time of 8:30 am. Patient's husband given the address and phone number for the clinic; along with the policy for mask and visitors

## 2021-02-19 ENCOUNTER — Telehealth: Payer: Self-pay | Admitting: General Practice

## 2021-02-19 NOTE — Telephone Encounter (Signed)
Transition Care Management Unsuccessful Follow-up Telephone Call  Date of discharge and from where:  02/17/21 from [facility]  Attempts:  1st Attempt  Reason for unsuccessful TCM follow-up call:  No answer/busy

## 2021-02-22 ENCOUNTER — Other Ambulatory Visit: Payer: Self-pay | Admitting: Family Medicine

## 2021-02-22 ENCOUNTER — Encounter: Payer: Self-pay | Admitting: Family Medicine

## 2021-02-22 MED ORDER — TRAMADOL HCL 50 MG PO TABS: 50.0000 mg | ORAL_TABLET | Freq: Every day | ORAL | 0 refills | Status: DC | PRN

## 2021-02-22 NOTE — Telephone Encounter (Signed)
Transition Care Management Unsuccessful Follow-up Telephone Call  Date of discharge and from where:  02/17/21 from [facility]  Attempts:  2nd Attempt  Reason for unsuccessful TCM follow-up call:  No answer/busy

## 2021-02-22 NOTE — Telephone Encounter (Signed)
She has been getting rx for oxycodone recently.

## 2021-02-24 NOTE — Telephone Encounter (Signed)
Transition Care Management Unsuccessful Follow-up Telephone Call  Date of discharge and from where:  02/17/21 from [facility]  Attempts:  3rd Attempt  Reason for unsuccessful TCM follow-up call:  No answer/busy

## 2021-02-26 ENCOUNTER — Other Ambulatory Visit: Payer: Self-pay | Admitting: Gynecologic Oncology

## 2021-02-26 ENCOUNTER — Inpatient Hospital Stay: Payer: Managed Care, Other (non HMO)

## 2021-02-26 ENCOUNTER — Telehealth: Payer: Self-pay

## 2021-02-26 ENCOUNTER — Encounter: Payer: Self-pay | Admitting: Gynecologic Oncology

## 2021-02-26 ENCOUNTER — Inpatient Hospital Stay: Payer: Managed Care, Other (non HMO) | Attending: Gynecologic Oncology | Admitting: Gynecologic Oncology

## 2021-02-26 ENCOUNTER — Other Ambulatory Visit: Payer: Self-pay

## 2021-02-26 VITALS — BP 156/87 | HR 140 | Temp 98.5°F | Resp 20 | Ht 64.57 in | Wt 246.5 lb

## 2021-02-26 DIAGNOSIS — F32A Depression, unspecified: Secondary | ICD-10-CM | POA: Diagnosis not present

## 2021-02-26 DIAGNOSIS — N83201 Unspecified ovarian cyst, right side: Secondary | ICD-10-CM | POA: Insufficient documentation

## 2021-02-26 DIAGNOSIS — G8929 Other chronic pain: Secondary | ICD-10-CM | POA: Diagnosis not present

## 2021-02-26 DIAGNOSIS — Z79899 Other long term (current) drug therapy: Secondary | ICD-10-CM | POA: Diagnosis not present

## 2021-02-26 DIAGNOSIS — Z794 Long term (current) use of insulin: Secondary | ICD-10-CM | POA: Insufficient documentation

## 2021-02-26 DIAGNOSIS — E1165 Type 2 diabetes mellitus with hyperglycemia: Secondary | ICD-10-CM

## 2021-02-26 DIAGNOSIS — Z6841 Body Mass Index (BMI) 40.0 and over, adult: Secondary | ICD-10-CM | POA: Diagnosis not present

## 2021-02-26 DIAGNOSIS — Z8742 Personal history of other diseases of the female genital tract: Secondary | ICD-10-CM

## 2021-02-26 DIAGNOSIS — R399 Unspecified symptoms and signs involving the genitourinary system: Secondary | ICD-10-CM

## 2021-02-26 DIAGNOSIS — N736 Female pelvic peritoneal adhesions (postinfective): Secondary | ICD-10-CM

## 2021-02-26 DIAGNOSIS — F419 Anxiety disorder, unspecified: Secondary | ICD-10-CM | POA: Diagnosis not present

## 2021-02-26 DIAGNOSIS — R634 Abnormal weight loss: Secondary | ICD-10-CM | POA: Diagnosis not present

## 2021-02-26 DIAGNOSIS — M797 Fibromyalgia: Secondary | ICD-10-CM | POA: Insufficient documentation

## 2021-02-26 DIAGNOSIS — R000 Tachycardia, unspecified: Secondary | ICD-10-CM | POA: Diagnosis not present

## 2021-02-26 DIAGNOSIS — N83209 Unspecified ovarian cyst, unspecified side: Secondary | ICD-10-CM

## 2021-02-26 DIAGNOSIS — M1990 Unspecified osteoarthritis, unspecified site: Secondary | ICD-10-CM | POA: Diagnosis not present

## 2021-02-26 DIAGNOSIS — Z90710 Acquired absence of both cervix and uterus: Secondary | ICD-10-CM | POA: Insufficient documentation

## 2021-02-26 DIAGNOSIS — Z793 Long term (current) use of hormonal contraceptives: Secondary | ICD-10-CM | POA: Insufficient documentation

## 2021-02-26 DIAGNOSIS — R102 Pelvic and perineal pain: Secondary | ICD-10-CM | POA: Diagnosis not present

## 2021-02-26 DIAGNOSIS — F1120 Opioid dependence, uncomplicated: Secondary | ICD-10-CM | POA: Insufficient documentation

## 2021-02-26 DIAGNOSIS — N83299 Other ovarian cyst, unspecified side: Secondary | ICD-10-CM

## 2021-02-26 DIAGNOSIS — Z90721 Acquired absence of ovaries, unilateral: Secondary | ICD-10-CM | POA: Insufficient documentation

## 2021-02-26 DIAGNOSIS — N390 Urinary tract infection, site not specified: Secondary | ICD-10-CM

## 2021-02-26 DIAGNOSIS — Z7984 Long term (current) use of oral hypoglycemic drugs: Secondary | ICD-10-CM | POA: Insufficient documentation

## 2021-02-26 LAB — URINALYSIS, COMPLETE (UACMP) WITH MICROSCOPIC
Bilirubin Urine: NEGATIVE
Glucose, UA: >=500 mg/dL — AB
Hgb urine dipstick: NEGATIVE
Ketones, ur: 20 mg/dL — AB
Nitrite: POSITIVE — AB
Protein, ur: 30 mg/dL — AB
Specific Gravity, Urine: 1.039 — ABNORMAL HIGH (ref 1.005–1.030)
WBC, UA: >50 WBC/hpf — ABNORMAL HIGH (ref 0–5)
pH: 5 (ref 5.0–8.0)

## 2021-02-26 MED ORDER — ORILISSA 150 MG PO TABS: 150.0000 mg | ORAL_TABLET | Freq: Every day | ORAL | 3 refills | Status: DC

## 2021-02-26 MED ORDER — NITROFURANTOIN MONOHYD MACRO 100 MG PO CAPS: 100.0000 mg | ORAL_CAPSULE | Freq: Two times a day (BID) | ORAL | 0 refills | Status: AC

## 2021-02-26 MED ORDER — NORETHINDRONE ACETATE 5 MG PO TABS: 5.0000 mg | ORAL_TABLET | Freq: Every day | ORAL | 3 refills | Status: DC

## 2021-02-26 NOTE — Progress Notes (Signed)
[place] PATIENT CONSULTATION   Patient Name: Christine Grant  Patient Age: 46 y.o. Date of Service: 02/26/21 Referring Provider: Dr. Radene Gunning  Primary Care Provider: Luetta Nutting, DO Consulting Provider: Jeral Pinch, MD   Assessment/Plan:  Premenopausal patient with chronic pelvic pain in the setting of a small adnexal cyst and complex surgical history.    I reviewed multiple imaging studies with the patient and her husband over the last 1.5 years. We reviewed the images together from her last ultrasound in our system. While I only have the reports from much of her [facility] imaging, I discussed the overall stable size of her right ovary with some small increases and decreases in the measurements of the small lesion/tumor. Her MRI from October notes that the ovarian or para-ovarian nodule could be seen on imaging in 2020 and was thought to be likely associated with the ovary but abutting the cecum. While we can not rule out a malignancy definitively without resection of the ovary for pathology review, the waxing and waning yet overall relatively stable small size of this ovarian lesion supports the low likelihood of malignancy.    With regard to surgery, we discussed that excision of the right ovary is possible but is associated with some risk. Her prior surgeries have been complicated by adhesive disease and have required conversion to laparotomy due to concern for injury with lsc surgery. The patient is very understanding of any risk related to surgery and that surgery may not relieve her underlying RLQ pain, could lead to significant surgical morbidity, and could lead to worsening of adhesive disease.   We discussed causes of her right lower quadrant pain. Given its size, it would be quite unusual for an ovary measuring 3cm overall to cause symptoms. Additionally, in the setting of such a small ovary and prior hysterectomy (as well as report of the right adnexa being  adherent to the pelvic side wall), torsion of that adnexa is virtually impossible and does not fit her pain history. With her known adhesive disease, her pain may be secondary to intra-abdominal adhesions. It could also be musculoskeletal in nature.   I am somewhat concerned about her weight loss, which has been unintentional over the last 4 months. It is difficult to know what has caused this, although likely related to her decreased appetite. I am concerned that this may be in part related to her poorly controlled diabetes. Last hgb A1c was >10% and recent glucose testing earlier this month showed cbgs between 250-473.   We will plan on repeat imaging in 3 months to assess her right adnexa.   In terms of helping to provide her some relief, we discussed multiple strategies. I think the idea of ovarian suppression (to hopefully help prevent any further cyst formation and potentially allow resolution of current lesion) is a worthwhile consideration - this has been discussed with her previously at [facility] visits. She is waiting to here about insurance approval of Lupron. We discussed two other medications that could accomplish similar outcomes - one would be progesterone only pills (norethindrone) and the other would be [place]. The patient had findings at the time of one of her surgeries that were concerning for endometriosis. I will send both medications to her pharmacy, and if we can sufficient coverage for the Orilissa, will plan to have her start that medication. Otherwise, she will take the Aygestin. We discussed both medications and she was given a patient information for each.   We also discussed the  option of referral to pelvic floor physical therapy. She has some pain on pelvic exam (although more apex of the vagina than pelvic floor muscles). She has been getting intermittent narcotic prescriptions from various providers and at the time of her ED visits. I voiced concern about this and that it is not  a sustainable method by which to control her pain. I think she needs a multi-modal approach and I am a specialist in this area. I have recommended a referral to a pain clinic in the area. The patient was amenable to this suggestion and a referral was placed today.   In terms of her urinary symptoms, we will get a UA and culture today. Plan to treat if evidence of infection.   A copy of this note was sent to the patient's referring provider.   70 minutes of total time was spent for this patient encounter, including preparation, face-to-face counseling with the patient and coordination of care, and documentation of the encounter.   Jeral Pinch, MD  Division of Gynecologic Oncology  Department of Obstetrics and Gynecology  [facility]  ___________________________________________  Chief Complaint: Chief Complaint  Patient presents with   Complex ovarian cyst    History of Present Illness:  Christine Grant is a 46 y.o. y.o. female who is seen in consultation at the request of Dr. Damita Dunnings for an evaluation of adnexal mass.    Patient was initially seen in our clinic in 2015-2016 in the setting of chronic pelvic pain, narcotic dependency, and desire for surgical intervention.  She was found to have a thickened endometrial lining and given risk factors for endometrial hyperplasia and malignancy, endometrial sampling was recommended.  She underwent D&C on 02/13/2014 with final pathology revealing benign secretory endometrium.  Her pain was very difficult to manage postoperatively as described in Dr. Serita Grit note from 02/28/2014.  Her subsequent treatment history is as noted below from most recent 2 oldest.  Shortly after her visit last in our clinic, she underwent robotic hysterectomy. Other surgeries since have included left salpingo-oophorectomy for enlarged ovary and concern for torsion, as well as appendectomy.  At the time of both surgeries, extensive pelvic adhesions  were noted in both surgeries required conversion from laparoscopy to open.  02/17/2021: Pelvic ultrasound exam at [place] shows right ovary measuring 3.5 x 2.7 x 3.1 cm.  Small lesion measuring 1.7 x 1.6 x 1.3 cm which does appear to have small amount of internal flow or possible artifact.  Impression is that there is a more solid-appearing lesion on the right ovary compared to 2 cyst like structures on prior examinations.  The cyst could represent hemorrhagic complex cyst or proteinaceous ovarian cyst.  Low degree suspicion for neoplasm considering waxing and waning appearance of the cyst on multiple prior exams. 12/17/2020: Pelvic ultrasound exam at [place].  Right ovary measures 3.5 x 3.2 x 3.2 cm.  Mildly hypoechoic mass within the right ovary measures 1.8 x 1.6 x 1.7 cm and has associated Doppler flow.  Not significantly changed in size compared to October 2022 MRI. 12/17/2020: CT of the abdomen and pelvis shows enhancing area within the right ovary, possibly due to endometrioma, hemorrhagic cyst, or mass.  This is measured to be at least 2.3 x 1.5 cm. 10/30/2020: Pelvic MRI at [facility] shows right ovary measuring 3.5 x 1.8 x 2 cm.  Multiple follicles present.  Within the inferior aspect of the ovary, there is an involuting ovarian cyst with typical peripheral enhancement  measuring 2 cm.  Within the superior aspect, there is a T1/T2 hypointense lesion measuring 1.8 x 2 cm, demonstrating diffuse enhancement postcontrast.  Nodule appears to be within the ovary but closely abuts the cecum.  In retrospect review of prior CT scans, this nodule appears to have been present since 2020 and slightly hyperdense as compared to surrounding ovarian tissue. 06/28/2020: Pelvic ultrasound exam at [place] shows right ovary measuring 3.9 x 2.7 x 2.8 cm with a 1.4 cm simple appearing cyst. 03/06/2020: Pelvic ultrasound reveals right ovary measures 3.3 x 3.2 x 2.6 cm with a 1.8 cm dominant  follicle. 06/2019: Patient presented with several months of right lower quadrant pain with episodes of nausea.  Given concern for early appendicitis, patient was taken to the operating room for an appendectomy.  This was a laparoscopic procedure converted to open secondary to significant adhesive disease.  Findings at the time of surgery included a enlarged body and tip of the appendix.  Postoperative course was complicated by delayed healing of incision. 06/16/2019: Pelvic ultrasound exam reveals a right ovary measuring 3.1 x 2.3 x 2.6 cm with a small anechoic follicle measuring 1.2 cm.  Previously seen hemorrhagic cyst noted to have involuted. 06/15/2019: CT of the abdomen and pelvis shows borderline dilation of the tip of the appendix measuring 8 mm with very minimal inflammation.  Ill-defined 1.5 cm hypoattenuating lesion in the posterior segment 4 of the liver.  Chronic scarring along the anterior bladder and in the soft tissues of the low anterior pelvic wall. 02/04/2018: Pelvic ultrasound shows right ovary measures 7.8 x 6.4 x 5.9 cm with a 6.4 x 5.8 x 4.7 cm cyst containing multiple thin internal septations and membranes with low-level internal echoes.  Cyst thought to most likely represent a hemorrhagic cyst or endometrioma. 06/2017: Patient underwent exploratory laparoscopy converted to exploratory laparotomy, lysis of adhesion, cystoscopy, left salpingo-oophorectomy, and sigmoidoscopy in the setting of abdominal pain and concern for ovarian torsion.  Procedure was complicated by dense adhesive disease requiring conversion to open procedure with an estimated blood loss of 300 cc.  At the time of presentation, patient was noted to have an enlarged 6.6 cm left ovary with both imaging and exam findings concerning for torsion.  Findings at the time of surgery included omental and small bowel adhesions to the anterior abdominal wall.  Sigmoid colon was draped over the left ovary.  Right ovary noted to be normal  in appearance and adherent to the sidewall.  Pathology from surgery showed hemorrhagic cyst and intraparenchymal ovarian hemorrhage consistent with ischemic changes.  Benign ovary. 2016: Robotic hysterectomy for abnormal uterine bleeding and chronic pelvic pain.  Per patient report, extensive adhesions were noted at that time as well as likely endometriosis.  CEA on 10/28/2020: 3.1  Today, the patient describes a couple of years of right lower pelvic pain.  She notes that this occurs most of the time and is usually a dull and throbbing pain.  Intermittently, she will have sharp pain that causes her to catch her breath.  These episodes last for 10 or 15 minutes.  She rates them at a 9 out of 10 in terms of pain.  She sometimes will have associated symptoms including radiation to her right low back, increased heart rate, and intermittent nausea.  Patient was seen most recently by Dr. Damita Dunnings on 1/27 in the setting of continued right lower quadrant pain. Most recent pelvic ultrasound exam in our system on 2/25 shows right ovary measures 3.3  x 3.2 x 2.6 cm with a 1.8 cm dominant follicle in the ovary.  No suspicious masses.  She denies any vaginal bleeding or discharge.  She reports regular bowel function.  She has had multiple days of dysuria and thinks she may have a urinary tract infection.  She notes her appetite has been up and down.  Secondary to pain, she endorses losing about 15 pounds in the last month and a half unintentionally.  Her history is notable for poorly controlled type 2 diabetes: Her most recent hemoglobin A1c was 10.7% in October.  She has follow-up in the near future for this.  Her BMI is 41.6.  She comes in with her husband today.  She works in [employer].  PAST MEDICAL HISTORY:  Past Medical History:  Diagnosis Date   Abnormal uterine bleeding    Anxiety    Arthritis    knees   Chronic pelvic pain in female    Degenerative joint disease    back   Depression     Endometriosis of pelvis    Fibromyalgia    History of ketoacidosis    01-15-2014   PONV (postoperative nausea and vomiting)    Sinus tachycardia    on metoprolol   Type 2 diabetes mellitus ([place])    Wears contact lenses      PAST SURGICAL HISTORY:  Past Surgical History:  Procedure Laterality Date   ABDOMINAL HYSTERECTOMY     BREAST REDUCTION SURGERY  age 33   CESAREAN SECTION  2002,  2005,  2008   DILATION AND CURETTAGE OF UTERUS N/A 02/13/2014   Procedure: DILATATION AND CURETTAGE;  Surgeon: Everitt Amber, MD;  Location: [facility];  Service: Gynecology;  Laterality: N/A;   LEFT OOPHORECTOMY  2019   MANDIBLE SURGERY  age 51   Correct overbite   REDUCTION MAMMAPLASTY      OB/GYN HISTORY:  OB History  Gravida Para Term Preterm AB Living  4 3 3     3   SAB IAB Ectopic Multiple Live Births               # Outcome Date GA Lbr Len/2nd Weight Sex Delivery Anes PTL Lv  4 Gravida           3 Term      CS-LTranv     2 Term      CS-LTranv     1 Term      CS-LTranv       No LMP recorded (lmp unknown). Patient has had a hysterectomy.  Age at menarche: 8 Age at menopause: Menses stopped at 32.  Denies any hot flashes or other menopausal symptoms. Hx of HRT: Denies Hx of STDs: Denies Last pap: Unsure History of abnormal pap smears: Denies  SCREENING STUDIES:  Last mammogram: 2020  Last colonoscopy: 10/2020 -indication was for a possible cecal lesion noted on CT scan.  Findings included small colon polyps, mild external hemorrhoids, otherwise normal colonoscopy including cecum and terminal ileum.  Both transverse colon and sigmoid colon polyps showed tubular adenomas.  MEDICATIONS: Outpatient Encounter Medications as of 02/26/2021  Medication Sig   acetaminophen (TYLENOL) 500 MG tablet Take 1,000 mg by mouth every 6 (six) hours as needed for moderate pain.   ALPRAZolam (ALPRAZOLAM XR) 1 MG 24 hr tablet Take 1 tablet (1 mg total) by mouth daily.   ALPRAZolam (XANAX)  1 MG tablet TAKE 1 TABLET BY MOUTH IN THE AFTERNOON AS NEEDED FOR ANXIETY   Continuous  Blood Gluc Sensor (FREESTYLE LIBRE 2 SENSOR) MISC Apply new device every 14 days to monitor glucose.   cyclobenzaprine (FLEXERIL) 10 MG tablet TAKE 1 TABLET BY MOUTH THREE TIMES A DAY AS NEEDED FOR MUSCLE SPASMS (Patient taking differently: Take 10 mg by mouth 3 (three) times daily.)   dapagliflozin propanediol (FARXIGA) 10 MG TABS tablet Take 1 tablet (10 mg total) by mouth daily.   Elagolix Sodium (ORILISSA) 150 MG TABS Take 150 mg by mouth daily.   gabapentin (NEURONTIN) 600 MG tablet TAKE 2 TABLETS (1,200 MG TOTAL) BY MOUTH 3 (THREE) TIMES DAILY.   metoprolol succinate (TOPROL-XL) 25 MG 24 hr tablet TAKE 1 TABLET (25 MG TOTAL) BY MOUTH DAILY.   ondansetron (ZOFRAN ODT) 4 MG disintegrating tablet Take 1 tablet (4 mg total) by mouth every 8 (eight) hours as needed for nausea or vomiting.   traMADol (ULTRAM) 50 MG tablet Take 1 tablet (50 mg total) by mouth daily as needed for moderate pain.   insulin detemir (LEVEMIR FLEXTOUCH) 100 UNIT/ML FlexPen Inject 26 Units into the skin daily. (Patient taking differently: Inject 32 Units into the skin daily.)   Insulin Pen Needle (ADVOCATE INSULIN PEN NEEDLES) 33G X 4 MM MISC 1 each by Does not apply route daily. (Patient not taking: Reported on 02/26/2021)   leuprolide (LUPRON) 3.75 MG injection Inject 3.75 mg into the muscle every 28 (twenty-eight) days. (Patient not taking: Reported on 02/26/2021)   norethindrone (AYGESTIN) 5 MG tablet Take 1 tablet (5 mg total) by mouth daily. (Patient not taking: Reported on 02/26/2021)   promethazine (PHENERGAN) 25 MG tablet Take 1 tablet (25 mg total) by mouth every 6 (six) hours as needed for nausea or vomiting. (Patient not taking: Reported on 01/24/2021)   QUEtiapine (SEROQUEL) 50 MG tablet Take 1-2 tablets (50-100 mg total) by mouth at bedtime. (Patient not taking: Reported on 02/05/2021)   sulfamethoxazole-trimethoprim (BACTRIM DS)  800-160 MG tablet Take 1 tablet by mouth 2 (two) times daily. (Patient not taking: Reported on 01/24/2021)   triamcinolone cream (KENALOG) 0.5 % Apply 1 application topically 2 (two) times daily. (Patient not taking: Reported on 02/26/2021)   No facility-administered encounter medications on file as of 02/26/2021.    ALLERGIES:  Allergies  Allergen Reactions   Toradol [Ketorolac Tromethamine] Other (See Comments)    Chest tightness, flushed      FAMILY HISTORY:  Family History  Adopted: Yes  Problem Relation Age of Onset   Hyperlipidemia Mother    Hypertension Mother    Diabetes Mother    Breast cancer Maternal Aunt    Endometriosis Maternal Aunt    Colon cancer Neg Hx    Pancreatic cancer Neg Hx    Ovarian cancer Neg Hx    Prostate cancer Neg Hx      SOCIAL HISTORY:  Social Connections: Not on file    REVIEW OF SYSTEMS:  Pertinent positives include appetite changes, fatigue, unintentional weight loss, abdominal pain, pain with urination, pelvic pain. Denies fevers, chills. Denies hearing loss, neck lumps or masses, mouth sores, ringing in ears or voice changes. Denies cough or wheezing.  Denies shortness of breath. Denies chest pain or palpitations. Denies leg swelling. Denies abdominal distention, blood in stools, constipation, diarrhea, nausea, vomiting, or early satiety. Denies pain with intercourse, frequency, hematuria or incontinence. Denies hot flashes, vaginal bleeding or vaginal discharge.   Denies joint pain, back pain or muscle pain/cramps. Denies itching, rash, or wounds. Denies dizziness, headaches, numbness or seizures. Denies swollen lymph nodes or  glands, denies easy bruising or bleeding. Denies anxiety, depression, confusion, or decreased concentration.  Physical Exam:  Vital Signs for this encounter:  Blood pressure (!) 156/87, pulse (!) 140, temperature 98.5 F (36.9 C), temperature source Oral, resp. rate 20, height 5' 4.57" (1.64 m), weight 246 lb  8 oz (111.8 kg), SpO2 100 %. Body mass index is 41.57 kg/m. General: Alert, oriented, no acute distress.  HEENT: Normocephalic, atraumatic. Sclera anicteric.  Chest: Clear to auscultation bilaterally. No wheezes, rhonchi, or rales. Cardiovascular: Tachycardic, regular rhythm, no murmurs, rubs, or gallops.  Abdomen: Obese. Normoactive bowel sounds. Soft, nondistended, nontender to palpation. No masses or hepatosplenomegaly appreciated. No palpable fluid wave.  Multiple well-healed incisions. Extremities: Grossly normal range of motion. Warm, well perfused. No edema bilaterally.  Skin: No rashes or lesions.  Lymphatics: No cervical, supraclavicular, or inguinal adenopathy.  GU: Normal external female genitalia. No lesions. No discharge or bleeding. On speculum exam, well rugated vaginal mucosa, no lesions or masses.  Vaginal cuff intact.  On bimanual exam, patient has moderate tenderness with palpation along the superior aspect of the vagina.  Unable to appreciate any adnexal masses.  Patient also has pain with palpation in the right lower quadrant with the abdominal hand.  LABORATORY AND RADIOLOGIC DATA:  Outside medical records were reviewed to synthesize the above history, along with the history and physical obtained during the visit.   Lab Results  Component Value Date   WBC 8.3 01/24/2021   HGB 16.5 (H) 01/24/2021   HCT 48.5 (H) 01/24/2021   PLT 260 01/24/2021   GLUCOSE 362 (H) 01/24/2021   CHOL 248 (A) 07/07/2016   TRIG 654 (A) 07/07/2016   HDL 35 07/07/2016   LDLCALC 132 07/07/2016   ALT 23 01/24/2021   AST 29 01/24/2021   NA 131 (L) 01/24/2021   K 4.9 01/24/2021   CL 99 01/24/2021   CREATININE 0.54 01/24/2021   BUN 10 01/24/2021   CO2 22 01/24/2021   TSH 1.06 08/06/2018   HGBA1C 11.7 (A) 05/12/2020   MICROALBUR 1.9 12/23/2014

## 2021-02-26 NOTE — Telephone Encounter (Signed)
Received return call from [facility]. Per Dr. Berline Lopes based on urinalysis it does appear there is a urinary tract infection. Macrobid has been sent to patients pharmacy. Advised that a urine culture has also been sent but results won't come back for a few days. Our office will call when the results are in. Instructed to call with any questions or concerns. Patient verbalized understanding

## 2021-02-26 NOTE — Telephone Encounter (Signed)
Attempted to contact patient with Urinalysis results. Unable to contact patient. Left message requesting return call.

## 2021-02-26 NOTE — Patient Instructions (Signed)
It was a pleasure meeting you today.  Today we discussed your several year history of right-sided pelvic pain.  We looked at your most recent ultrasound together which is overall very reassuring.  Given no significant growth in the size of the cyst/lesion on her near the right ovary as well as the way it looks on your recent ultrasound, my concern that this is related to a precancer is quite low.  Given the size of the ovary as well as the scar tissue that we know you have, I do not think that it is likely that the ovary is twisting on its blood supply.  I also think that the cyst itself is probably not what is causing your pain.  More likely, I think that your pain may be related to scar tissue from your prior surgeries as well as endometriosis.  It may also have some component related to pelvic floor dysfunction and fibromyalgia.  We have talked about the significant risk related to surgery given your prior history and known scar tissue.  Today we talked about doing multiple things: For your urinary symptoms, we will send your urine to tested for urinary tract infection.  If it looks like you have an infection, we will treat this with antibiotics. 2. In terms of your pelvic pain, we discussed trial of suppressing the ovary to see if we can prove whether your pain is related to the ovary itself.  We talked about multiple ways to do this.  You have spoken with your OB/GYN about Lupron already.  I have sent a prescription in for a progesterone pill as well as a pill that ask in a similar way to the Lupron injection.  I would recommend starting the progesterone pill until we know whether your insurance will pay for the other pill.  You do not need to be taking both.  The goal of using each is to help suppress the ovary from ovulating. 3.Related to pelvic floor dysfunction, I think evaluation and possible treatment with a pelvic floor physical therapist would be beneficial.  I placed a referral for this  today. 4. We will send a referral to a chronic pain specialist in town -[facility].  I think given your complex history, a multimodal approach with a pain specialist would be best. 5. I have ordered a repeat ultrasound to look at the right ovary in 3 months.  You and I will have a phone visit shortly after to discuss results as well as how you have been feeling.

## 2021-02-26 NOTE — Telephone Encounter (Signed)
Attempted to contact patient regarding her Orilissa prescription. Prior authorization has been received. Unable to leave message, voicemail full.

## 2021-02-28 LAB — URINE CULTURE: Culture: >=100000 — AB

## 2021-03-01 ENCOUNTER — Telehealth: Payer: Self-pay | Admitting: *Deleted

## 2021-03-01 NOTE — Telephone Encounter (Signed)
Attempted to contact pt to check up on UTI symptoms. Left voice massage to call back.

## 2021-03-01 NOTE — Telephone Encounter (Signed)
Fax records and referral to [facility]

## 2021-03-02 NOTE — Telephone Encounter (Signed)
Attempted to contact patient to follow up on UTI symptoms. Unable to reach patient, left voicemail requesting return call.

## 2021-03-04 ENCOUNTER — Ambulatory Visit: Payer: Managed Care, Other (non HMO) | Admitting: Family Medicine

## 2021-03-04 NOTE — Telephone Encounter (Signed)
Received call from Ms. Larrivee. She reports her UTI symptoms have cleared up since taking the antibiotics. Patient states " I do have a question though. Am I supposed to take both the orilissa and the progesterone? I only picked up the [place]."  Advised patient to only take the [place]. The other prescription was sent in if the orilissa was not approved by your insurance. Patient verbalized understanding and states she will only take [place].  Instructed to call with any questions or concerns.

## 2021-03-10 NOTE — Telephone Encounter (Signed)
Patient scheduled for 3/15 at 1 pm  ?

## 2021-03-15 ENCOUNTER — Other Ambulatory Visit: Payer: Self-pay | Admitting: Family Medicine

## 2021-03-18 ENCOUNTER — Other Ambulatory Visit: Payer: Self-pay

## 2021-03-18 ENCOUNTER — Encounter: Payer: Self-pay | Admitting: Family Medicine

## 2021-03-18 ENCOUNTER — Ambulatory Visit: Payer: Managed Care, Other (non HMO) | Admitting: Family Medicine

## 2021-03-18 VITALS — BP 140/76 | HR 130 | Temp 98.5°F | Ht 66.0 in | Wt 245.0 lb

## 2021-03-18 DIAGNOSIS — Z8669 Personal history of other diseases of the nervous system and sense organs: Secondary | ICD-10-CM

## 2021-03-18 DIAGNOSIS — N83299 Other ovarian cyst, unspecified side: Secondary | ICD-10-CM

## 2021-03-18 DIAGNOSIS — E669 Obesity, unspecified: Secondary | ICD-10-CM

## 2021-03-18 DIAGNOSIS — E1169 Type 2 diabetes mellitus with other specified complication: Secondary | ICD-10-CM

## 2021-03-18 DIAGNOSIS — F411 Generalized anxiety disorder: Secondary | ICD-10-CM

## 2021-03-18 LAB — POCT GLYCOSYLATED HEMOGLOBIN (HGB A1C): Hemoglobin A1C: 13.3 % — AB (ref 4.0–5.6)

## 2021-03-18 MED ORDER — BUSPIRONE HCL 10 MG PO TABS: 10.0000 mg | ORAL_TABLET | Freq: Two times a day (BID) | ORAL | 3 refills | Status: DC

## 2021-03-18 MED ORDER — LEVEMIR FLEXTOUCH 100 UNIT/ML ~~LOC~~ SOPN: 32.0000 [IU] | PEN_INJECTOR | Freq: Every day | SUBCUTANEOUS | 2 refills | Status: DC

## 2021-03-18 MED ORDER — FREESTYLE LIBRE 3 SENSOR MISC: 1.0000 | 6 refills | Status: DC

## 2021-03-18 MED ORDER — KETOCONAZOLE 2 % EX SHAM: 1.0000 "application " | MEDICATED_SHAMPOO | CUTANEOUS | 0 refills | Status: DC

## 2021-03-18 MED ORDER — QUETIAPINE FUMARATE 50 MG PO TABS: 50.0000 mg | ORAL_TABLET | Freq: Every day | ORAL | 2 refills | Status: DC

## 2021-03-18 MED ORDER — FLUOXETINE HCL 20 MG PO CAPS: 20.0000 mg | ORAL_CAPSULE | Freq: Every day | ORAL | 3 refills | Status: DC

## 2021-03-18 MED ORDER — DAPAGLIFLOZIN PROPANEDIOL 10 MG PO TABS: 10.0000 mg | ORAL_TABLET | Freq: Every day | ORAL | 3 refills | Status: DC

## 2021-03-18 MED ORDER — CLONAZEPAM 1 MG PO TABS: 1.0000 mg | ORAL_TABLET | Freq: Two times a day (BID) | ORAL | 1 refills | Status: DC | PRN

## 2021-03-18 NOTE — Patient Instructions (Addendum)
Restart levemir at 32 units. Increase by 2 units every 2 days ? ?Start fluoxetine at '20mg'$  daily.  ?Start buspar '10mg'$  twice per day. Discontinue alprazolam ER.  ?Use clonazepam as needed to replace alprazolam.  ? ?Try alpha-lipoic acid 400-'600mg'$  daily for nerve pain.  ? ?Try nizoral shampoo to help with scalp flakiness.  ? ?Follow up in 6 weeks.  ?

## 2021-03-19 MED ORDER — TRAMADOL HCL 50 MG PO TABS: 50.0000 mg | ORAL_TABLET | Freq: Every day | ORAL | 0 refills | Status: DC | PRN

## 2021-03-19 NOTE — Telephone Encounter (Signed)
Rx signed.

## 2021-03-21 ENCOUNTER — Encounter: Payer: Self-pay | Admitting: Family Medicine

## 2021-03-21 NOTE — Assessment & Plan Note (Signed)
[name] continues to her current medication regimen.  Discontinuing alprazolam ER and replacing with BuSpar.  Discontinue plain alprazolam IR and replacing with clonazepam as needed.  Start fluoxetine 20 mg daily for better control of symptoms at baseline. Return in about 6 weeks (around 04/29/2021) for F/u Anxiety. ? ?

## 2021-03-21 NOTE — Assessment & Plan Note (Signed)
Management per GYN.  Stable at this time with Freida Busman. ?

## 2021-03-21 NOTE — Assessment & Plan Note (Signed)
He is on tramadol as needed for headaches. ?

## 2021-03-21 NOTE — Progress Notes (Signed)
?Christine Grant - 46 y.o. female MRN [id number]  Date of birth: 11-05-1975 ? ?Subjective ?Chief Complaint  ?Patient presents with  ? Follow-up  ? Diabetes  ? ? ?HPI ?Christine Grant is a 46 year old female here today for follow-up visit.  She has been off of her medications for diabetes for several weeks.  Since I last saw her she has been in the ER couple of times due to pain related to complex ovarian cyst.  It is currently managed by GYN.  Recently prescribed Orilissa.  She is using tramadol for pain related to this as well as chronic pain however she has gotten some fills of hydrocodone from the ER. ? ?She is interested in trying to change of her regimen for anxiety and try to reduce her alprazolam use. ? ?She has noted some increased dandruff and flakiness of her scalp.  She would like to try something for treatment of this. ? ?ROS:  A comprehensive ROS was completed and negative except as noted per HPI ? ?Allergies  ?Allergen Reactions  ? Toradol [Ketorolac Tromethamine] Other (See Comments)  ?  Chest tightness, flushed   ? ? ?Past Medical History:  ?Diagnosis Date  ? Abnormal uterine bleeding   ? Anxiety   ? Arthritis   ? knees  ? Chronic pelvic pain in female   ? Degenerative joint disease   ? back  ? Depression   ? Endometriosis of pelvis   ? Fibromyalgia   ? History of ketoacidosis   ? 01-15-2014  ? PONV (postoperative nausea and vomiting)   ? Sinus tachycardia   ? on metoprolol  ? Type 2 diabetes mellitus ([place])   ? Wears contact lenses   ? ? ?Past Surgical History:  ?Procedure Laterality Date  ? ABDOMINAL HYSTERECTOMY    ? BREAST REDUCTION SURGERY  age 79  ? CESAREAN SECTION  2002,  2005,  2008  ? DILATION AND CURETTAGE OF UTERUS N/A 02/13/2014  ? Procedure: DILATATION AND CURETTAGE;  Surgeon: Everitt Amber, MD;  Location: [facility];  Service: Gynecology;  Laterality: N/A;  ? LEFT OOPHORECTOMY  2019  ? MANDIBLE SURGERY  age 89  ? Correct overbite  ? REDUCTION MAMMAPLASTY    ? ? ?Social History   ? ?Socioeconomic History  ? Marital status: Married  ?  Spouse name: Not on file  ? Number of children: Not on file  ? Years of education: Not on file  ? Highest education level: Not on file  ?Occupational History  ? Occupation: works in Press photographer  ?Tobacco Use  ? Smoking status: Never  ? Smokeless tobacco: Never  ?Vaping Use  ? Vaping Use: Never used  ?Substance and Sexual Activity  ? Alcohol use: No  ? Drug use: No  ? Sexual activity: Yes  ?  Birth control/protection: Surgical  ?Other Topics Concern  ? Not on file  ?Social History Narrative  ? Not on file  ? ?Social Determinants of Health  ? ?Financial Resource Strain: Not on file  ?Food Insecurity: Food Insecurity Present  ? Worried About [place] fund[place] in the Last Year: Sometimes true  ? Ran Out of Food in the Last Year: Never true  ?Transportation Needs: No Transportation Needs  ? Lack of Transportation (Medical): No  ? Lack of Transportation (Non-Medical): No  ?Physical Activity: Not on file  ?Stress: Not on file  ?Social Connections: Not on file  ? ? ?Family History  ?Adopted: Yes  ?Problem Relation Age of Onset  ?  Hyperlipidemia Mother   ? Hypertension Mother   ? Diabetes Mother   ? Breast cancer Maternal Aunt   ? Endometriosis Maternal Aunt   ? Colon cancer Neg Hx   ? Pancreatic cancer Neg Hx   ? Ovarian cancer Neg Hx   ? Prostate cancer Neg Hx   ? ? ?Health Maintenance  ?Topic Date Due  ? OPHTHALMOLOGY EXAM  Never done  ? HIV Screening  Never done  ? Hepatitis C Screening  Never done  ? URINE MICROALBUMIN  12/23/2015  ? COVID-19 Vaccine (3 - Booster for Moderna series) 05/08/2019  ? FOOT EXAM  05/12/2021 (Originally 06/25/2019)  ? HEMOGLOBIN A1C  09/18/2021  ? TETANUS/TDAP  01/25/2023  ? COLONOSCOPY (Pts 45-70yr Insurance coverage will need to be confirmed)  10/30/2027  ? HPV VACCINES  Aged Out   ? ? ? ?----------------------------------------------------------------------------------------------------------------------------------------------------------------------------------------------------------------- ?Physical Exam ?BP 140/76 (BP Location: Left Arm, Patient Position: Sitting, Cuff Size: Large)   Pulse (!) 130   Temp 98.5 ?F (36.9 ?C)   Ht '5\' 6"'$  (1.676 m)   Wt 245 lb (111.1 kg)   LMP  (LMP Unknown)   SpO2 98%   BMI 39.54 kg/m?  ? ?Physical Exam ?Constitutional:   ?   Appearance: Normal appearance.  ?Eyes:  ?   General: No scleral icterus. ?Cardiovascular:  ?   Rate and Rhythm: Normal rate and regular rhythm.  ?Pulmonary:  ?   Effort: Pulmonary effort is normal.  ?   Breath sounds: Normal breath sounds.  ?Musculoskeletal:  ?   Cervical back: Neck supple.  ?Neurological:  ?   General: No focal deficit present.  ?   Mental Status: She is alert.  ?Psychiatric:     ?   Mood and Affect: Mood normal.     ?   Behavior: Behavior normal.  ? ? ?------------------------------------------------------------------------------------------------------------------------------------------------------------------------------------------------------------------- ?Assessment and Plan ? ?Diabetes mellitus type 2 in obese ?Diabetes is currently poorly controlled.  She has been off medications for several weeks and we will have her restart these at previous dosing.  Prescriptions for Levemir and FWilder Gladerenewed.  We will hold off on Trulicity at this time but will likely add back on GLP-1 in the near future.  Titrate Levemir by 2 units every 2 days that blood sugars greater than 150.  Freestyle libre prescribed to be able to monitor glucose closely at home. ? ?Complex ovarian cyst ?Management per GYN.  Stable at this time with OFreida Busman ? ?GAD (generalized anxiety disorder) ?EMargaretha Sheffieldcontinues to her current medication regimen.  Discontinuing alprazolam ER and replacing with BuSpar.  Discontinue plain alprazolam IR and  replacing with clonazepam as needed.  Start fluoxetine 20 mg daily for better control of symptoms at baseline. Return in about 6 weeks (around 04/29/2021) for F/u Anxiety. ? ? ?History of migraine headaches ?He is on tramadol as needed for headaches. ? ? ?Meds ordered this encounter  ?Medications  ? insulin detemir (LEVEMIR FLEXTOUCH) 100 UNIT/ML FlexPen  ?  Sig: Inject 32 Units into the skin daily. Increase by 2 units every 2 days if fasting blood sugar remains >150.  May use up to max of 60 units/day  ?  Dispense:  30 mL  ?  Refill:  2  ? dapagliflozin propanediol (FARXIGA) 10 MG TABS tablet  ?  Sig: Take 1 tablet (10 mg total) by mouth daily.  ?  Dispense:  90 tablet  ?  Refill:  3  ? QUEtiapine (SEROQUEL) 50 MG tablet  ?  Sig: Take  1-2 tablets (50-100 mg total) by mouth at bedtime.  ?  Dispense:  180 tablet  ?  Refill:  2  ? Continuous Blood Gluc Sensor (FREESTYLE LIBRE 3 SENSOR) MISC  ?  Sig: 1 Device by Does not apply route every 14 (fourteen) days. Place 1 sensor on the skin every 14 days. Use to check glucose continuously  ?  Dispense:  2 each  ?  Refill:  6  ? busPIRone (BUSPAR) 10 MG tablet  ?  Sig: Take 1 tablet (10 mg total) by mouth 2 (two) times daily. Replaces alprazolam XR.  ?  Dispense:  60 tablet  ?  Refill:  3  ? FLUoxetine (PROZAC) 20 MG capsule  ?  Sig: Take 1 capsule (20 mg total) by mouth daily.  ?  Dispense:  90 capsule  ?  Refill:  3  ? clonazePAM (KLONOPIN) 1 MG tablet  ?  Sig: Take 1 tablet (1 mg total) by mouth 2 (two) times daily as needed for anxiety. Replaced Alprazolam IR  ?  Dispense:  45 tablet  ?  Refill:  1  ? ketoconazole (NIZORAL) 2 % shampoo  ?  Sig: Apply 1 application. topically 2 (two) times a week.  ?  Dispense:  120 mL  ?  Refill:  0  ? ? ?Return in about 6 weeks (around 04/29/2021) for F/u Anxiety. ? ? ? ?This visit occurred during the SARS-CoV-2 public health emergency.  Safety protocols were in place, including screening questions prior to the visit, additional usage of  staff PPE, and extensive cleaning of exam room while observing appropriate contact time as indicated for disinfecting solutions.  ? ?

## 2021-03-21 NOTE — Assessment & Plan Note (Signed)
Diabetes is currently poorly controlled.  She has been off medications for several weeks and we will have her restart these at previous dosing.  Prescriptions for Levemir and Wilder Glade renewed.  We will hold off on Trulicity at this time but will likely add back on GLP-1 in the near future.  Titrate Levemir by 2 units every 2 days that blood sugars greater than 150.  Freestyle libre prescribed to be able to monitor glucose closely at home. ?

## 2021-03-24 ENCOUNTER — Encounter: Payer: Self-pay | Admitting: Family Medicine

## 2021-03-25 ENCOUNTER — Other Ambulatory Visit: Payer: Self-pay | Admitting: Family Medicine

## 2021-03-25 ENCOUNTER — Encounter: Payer: Self-pay | Admitting: Family Medicine

## 2021-03-25 ENCOUNTER — Telehealth: Payer: Self-pay

## 2021-03-25 NOTE — Telephone Encounter (Signed)
Letter completed. When she brings medications in.  Please document and have witness document that these were placed in medications disposal container.  Thanks! ? ?CM

## 2021-03-25 NOTE — Telephone Encounter (Signed)
Mr. Caprio called requesting a letter be written to Dr. Luisa Hart stating that you will not prescribe narcotics while she is under pain contact.  ? ?She wll bring current benzo's to the office for disposal.  ? ?FYI: Dr. Moises Blood office is not open on Fridays. ? ?Dr. Selinda Orion ?[facility] ?Fax: [phone] ?

## 2021-03-25 NOTE — Telephone Encounter (Signed)
Faxed letter to Dr. Luisa Hart. Called pharmacy and cancelled prescription for clonazepam. Dr. West Pugh cancelled Rx's in system.  ? ?Patient will bring meds to office on 03/25/21 and pick-up letter copy. ?

## 2021-04-07 ENCOUNTER — Other Ambulatory Visit: Payer: Self-pay | Admitting: Family Medicine

## 2021-04-10 ENCOUNTER — Other Ambulatory Visit: Payer: Self-pay | Admitting: Family Medicine

## 2021-04-19 ENCOUNTER — Other Ambulatory Visit: Payer: Self-pay | Admitting: Family Medicine

## 2021-04-29 ENCOUNTER — Ambulatory Visit: Payer: Managed Care, Other (non HMO) | Admitting: Family Medicine

## 2021-05-25 ENCOUNTER — Ambulatory Visit: Payer: Managed Care, Other (non HMO) | Admitting: Family Medicine

## 2021-05-25 ENCOUNTER — Encounter: Payer: Self-pay | Admitting: Family Medicine

## 2021-05-25 DIAGNOSIS — E1159 Type 2 diabetes mellitus with other circulatory complications: Secondary | ICD-10-CM

## 2021-05-25 DIAGNOSIS — E669 Obesity, unspecified: Secondary | ICD-10-CM

## 2021-05-25 DIAGNOSIS — E1169 Type 2 diabetes mellitus with other specified complication: Secondary | ICD-10-CM | POA: Diagnosis not present

## 2021-05-25 DIAGNOSIS — I152 Hypertension secondary to endocrine disorders: Secondary | ICD-10-CM

## 2021-05-25 DIAGNOSIS — F411 Generalized anxiety disorder: Secondary | ICD-10-CM

## 2021-05-25 MED ORDER — ALPRAZOLAM 0.5 MG PO TABS: 0.5000 mg | ORAL_TABLET | Freq: Every evening | ORAL | 3 refills | Status: DC | PRN

## 2021-05-25 MED ORDER — FLUOXETINE HCL 40 MG PO CAPS: 40.0000 mg | ORAL_CAPSULE | Freq: Every day | ORAL | 2 refills | Status: DC

## 2021-05-25 MED ORDER — ALPRAZOLAM ER 1 MG PO TB24
1.0000 mg | ORAL_TABLET | Freq: Every day | ORAL | 3 refills | Status: DC
Start: 2021-05-25 — End: 2021-07-12

## 2021-05-25 MED ORDER — OZEMPIC (0.25 OR 0.5 MG/DOSE) 2 MG/1.5ML ~~LOC~~ SOPN: PEN_INJECTOR | SUBCUTANEOUS | 1 refills | Status: DC

## 2021-05-25 NOTE — Patient Instructions (Addendum)
Levemir-Increase by 2 units every 2 days if fasting glucose is >150.  ?Start Ozempic 0.'25mg'$  weekly for the first 4 weeks then increase to 0.'5mg'$  weekly.  ?Increase fluoxetine to '40mg'$  daily.  ? ?

## 2021-05-26 ENCOUNTER — Other Ambulatory Visit: Payer: Self-pay | Admitting: Family Medicine

## 2021-05-26 ENCOUNTER — Other Ambulatory Visit: Payer: Managed Care, Other (non HMO)

## 2021-05-27 ENCOUNTER — Other Ambulatory Visit: Payer: Managed Care, Other (non HMO)

## 2021-05-27 ENCOUNTER — Other Ambulatory Visit: Payer: Self-pay | Admitting: Gynecologic Oncology

## 2021-05-27 DIAGNOSIS — Z8742 Personal history of other diseases of the female genital tract: Secondary | ICD-10-CM

## 2021-05-28 ENCOUNTER — Ambulatory Visit (INDEPENDENT_AMBULATORY_CARE_PROVIDER_SITE_OTHER): Payer: Managed Care, Other (non HMO)

## 2021-05-28 DIAGNOSIS — N83291 Other ovarian cyst, right side: Secondary | ICD-10-CM | POA: Diagnosis not present

## 2021-05-28 DIAGNOSIS — N83299 Other ovarian cyst, unspecified side: Secondary | ICD-10-CM

## 2021-05-30 ENCOUNTER — Encounter: Payer: Self-pay | Admitting: Family Medicine

## 2021-05-30 NOTE — Assessment & Plan Note (Signed)
Diabetes is not well controlled at this time.  Recommend increasing levemir by 2 units every 2-3 days that blood glucose >150.  Adding Ozempic as well and will titrate based on response and tolerability.

## 2021-05-30 NOTE — Progress Notes (Signed)
Christine Grant - 46 y.o. female MRN [id number]  Date of birth: 01/20/1975  Subjective Chief Complaint  Patient presents with   Follow-up    HPI  Christine Grant is a 46 y.o. female here today for follow up visit.  She was seeing pain management however had to be off all benzo's prior to adding pain medication.  She stopped this cold [place] and has had increased anxiety and panic attacks over the past several weeks.  She would like to hold on on pain clinic treatment as she feels that she needs to manage her anxiety more effectively at this time.  She did well with alprazolam xr daily with additional alprazolam IR if needed.  She does not feel that buspar has been very effective.  She does continue on fluoxetine.   Her blood sugars have not been well controlled.  She is taking levemir.  Interested in trying glp-1 as well.  She does not stay very active but is planning on increasing activity level.    ROS:  A comprehensive ROS was completed and negative except as noted per HPI  Allergies  Allergen Reactions   Toradol [Ketorolac Tromethamine] Other (See Comments)    Chest tightness, flushed     Past Medical History:  Diagnosis Date   Abnormal uterine bleeding    Anxiety    Arthritis    knees   Chronic pelvic pain in female    Degenerative joint disease    back   Depression    Endometriosis of pelvis    Fibromyalgia    History of ketoacidosis    01-15-2014   PONV (postoperative nausea and vomiting)    Sinus tachycardia    on metoprolol   Type 2 diabetes mellitus ([place])    Wears contact lenses     Past Surgical History:  Procedure Laterality Date   ABDOMINAL HYSTERECTOMY     BREAST REDUCTION SURGERY  age 23   CESAREAN SECTION  2002,  2005,  2008   DILATION AND CURETTAGE OF UTERUS N/A 02/13/2014   Procedure: DILATATION AND CURETTAGE;  Surgeon: Everitt Amber, MD;  Location: [place];  Service: Gynecology;  Laterality: N/A;   LEFT OOPHORECTOMY  2019   MANDIBLE  SURGERY  age 83   Correct overbite   REDUCTION MAMMAPLASTY      Social History   Socioeconomic History   Marital status: Married    Spouse name: Not on file   Number of children: Not on file   Years of education: Not on file   Highest education level: Not on file  Occupational History   Occupation: works in medical billing  Tobacco Use   Smoking status: Never   Smokeless tobacco: Never  Vaping Use   Vaping Use: Never used  Substance and Sexual Activity   Alcohol use: No   Drug use: No   Sexual activity: Yes    Birth control/protection: Surgical  Other Topics Concern   Not on file  Social History Narrative   Not on file   Social Determinants of Health   Financial Resource Strain: Not on file  Food Insecurity: Food Insecurity Present   Worried About [place] in the Last Year: Sometimes true   [place] in the Last Year: Never true  Transportation Needs: No Transportation Needs   Lack of Transportation (Medical): No   Lack of Transportation (Non-Medical): No  Physical Activity: Not on file  Stress: Not on file  Social Connections: Not on  file    Family History  Adopted: Yes  Problem Relation Age of Onset   Hyperlipidemia Mother    Hypertension Mother    Diabetes Mother    Breast cancer Maternal Aunt    Endometriosis Maternal Aunt    Colon cancer Neg Hx    Pancreatic cancer Neg Hx    Ovarian cancer Neg Hx    Prostate cancer Neg Hx     Health Maintenance  Topic Date Due   OPHTHALMOLOGY EXAM  10/10/2021 (Originally 08/02/1986)   URINE MICROALBUMIN  10/10/2021 (Originally 12/23/2015)   COVID-19 Vaccine (3 - Booster for Moderna series) 10/10/2021 (Originally 05/08/2019)   Hepatitis C Screening  10/10/2021 (Originally 08/02/1994)   HIV Screening  10/10/2021 (Originally 08/02/1991)   INFLUENZA VACCINE  08/10/2021   HEMOGLOBIN A1C  09/18/2021   FOOT EXAM  05/26/2022   TETANUS/TDAP  01/25/2023   COLONOSCOPY (Pts 45-42yr Insurance coverage will  need to be confirmed)  10/30/2027   HPV VACCINES  Aged Out     ----------------------------------------------------------------------------------------------------------------------------------------------------------------------------------------------------------------- Physical Exam BP 118/83 (BP Location: Right Arm, Patient Position: Sitting, Cuff Size: Large)   Pulse (!) 115   Ht '5\' 6"'$  (1.676 m)   Wt 255 lb (115.7 kg)   LMP  (LMP Unknown)   SpO2 96%   BMI 41.16 kg/m   Physical Exam Constitutional:      Appearance: Normal appearance.  Eyes:     General: No scleral icterus. Cardiovascular:     Rate and Rhythm: Normal rate and regular rhythm.  Musculoskeletal:     Cervical back: Neck supple.  Neurological:     Mental Status: She is alert.  Psychiatric:        Mood and Affect: Mood normal.        Behavior: Behavior normal.    ------------------------------------------------------------------------------------------------------------------------------------------------------------------------------------------------------------------- Assessment and Plan  Diabetes mellitus type 2 in obese Diabetes is not well controlled at this time.  Recommend increasing levemir by 2 units every 2-3 days that blood glucose >150.  Adding Ozempic as well and will titrate based on response and tolerability.    Hypertension associated with diabetes ([place] BP stable at this time.  Continue current medications for mgmt of HTN.   GAD (generalized anxiety disorder) Increasing fluoxetine to '40mg'$  daily.  Will add back on alprazolam XR with an additional alprazolam to use as needed during the daay.  She will let me know when she wants to initiate pain management treatment again and we'll start a taper on her alprazolam   Meds ordered this encounter  Medications   ALPRAZolam (XANAX XR) 1 MG 24 hr tablet    Sig: Take 1 tablet (1 mg total) by mouth daily.    Dispense:  30 tablet    Refill:  3    ALPRAZolam (XANAX) 0.5 MG tablet    Sig: Take 1 tablet (0.5 mg total) by mouth at bedtime as needed for anxiety.    Dispense:  30 tablet    Refill:  3   FLUoxetine (PROZAC) 40 MG capsule    Sig: Take 1 capsule (40 mg total) by mouth daily.    Dispense:  90 capsule    Refill:  2   DISCONTD: Semaglutide,0.25 or 0.'5MG'$ /DOS, (OZEMPIC, 0.25 OR 0.5 MG/DOSE,) 2 MG/1.5ML SOPN    Sig: Inject 0.'25mg'$  weekly x4 weeks then increase to 0.'5mg'$  weekly.    Dispense:  3 mL    Refill:  1    Return in about 6 weeks (around 07/06/2021) for GAD/T2DM.    This  visit occurred during the SARS-CoV-2 public health emergency.  Safety protocols were in place, including screening questions prior to the visit, additional usage of staff PPE, and extensive cleaning of exam room while observing appropriate contact time as indicated for disinfecting solutions.

## 2021-05-30 NOTE — Assessment & Plan Note (Signed)
BP stable at this time.  Continue current medications for mgmt of HTN.

## 2021-05-30 NOTE — Assessment & Plan Note (Signed)
Increasing fluoxetine to '40mg'$  daily.  Will add back on alprazolam XR with an additional alprazolam to use as needed during the daay.  She will let me know when she wants to initiate pain management treatment again and we'll start a taper on her alprazolam

## 2021-05-31 ENCOUNTER — Inpatient Hospital Stay: Payer: Managed Care, Other (non HMO) | Attending: Gynecologic Oncology | Admitting: Gynecologic Oncology

## 2021-05-31 ENCOUNTER — Encounter: Payer: Self-pay | Admitting: Gynecologic Oncology

## 2021-05-31 ENCOUNTER — Telehealth: Payer: Self-pay | Admitting: General Practice

## 2021-05-31 DIAGNOSIS — N83299 Other ovarian cyst, unspecified side: Secondary | ICD-10-CM | POA: Diagnosis not present

## 2021-05-31 DIAGNOSIS — Z6841 Body Mass Index (BMI) 40.0 and over, adult: Secondary | ICD-10-CM

## 2021-05-31 DIAGNOSIS — R102 Pelvic and perineal pain: Secondary | ICD-10-CM

## 2021-05-31 DIAGNOSIS — E1165 Type 2 diabetes mellitus with hyperglycemia: Secondary | ICD-10-CM | POA: Diagnosis not present

## 2021-05-31 DIAGNOSIS — G8929 Other chronic pain: Secondary | ICD-10-CM

## 2021-05-31 NOTE — Telephone Encounter (Signed)
Transition Care Management Follow-up Telephone Call Date of discharge and from where: 05/29/21 from [facility] How have you been since you were released from the hospital? Doing better. Any questions or concerns? No  Items Reviewed: Did the pt receive and understand the discharge instructions provided? Yes  Medications obtained and verified? No  Other? No  Any new allergies since your discharge? No  Dietary orders reviewed? Yes Do you have support at home? Yes   Home Care and Equipment/Supplies: Were home health services ordered? no  Functional Questionnaire: (I = Independent and D = Dependent) ADLs: I  Bathing/Dressing- I  Meal Prep- I  Eating- I  Maintaining continence- I  Transferring/Ambulation- I  Managing Meds- I  Follow up appointments reviewed:  PCP Hospital f/u appt confirmed? No   Specialist Hospital f/u appt confirmed? No   Are transportation arrangements needed? No  If their condition worsens, is the pt aware to call PCP or go to the Emergency Dept.? Yes Was the patient provided with contact information for the PCP's office or ED? Yes Was to pt encouraged to call back with questions or concerns? Yes

## 2021-05-31 NOTE — Progress Notes (Signed)
Gynecologic Oncology Telehealth Consult Note: Gyn-Onc  I connected with [name] on 06/09/21 at  4:00 PM EDT by telephone and verified that I am speaking with the correct person using two identifiers.  I discussed the limitations, risks, security and privacy concerns of performing an evaluation and management service by telemedicine and the availability of in-person appointments. I also discussed with the patient that there may be a patient responsible charge related to this service. The patient expressed understanding and agreed to proceed.  Other persons participating in the visit and their role in the encounter: Patient's husband.  Patient's location: Home Provider's location: Christine Grant  Reason for Visit: Follow-up, discussion of recent ultrasound  Treatment History: Patient was initially seen in our clinic in 2015-2016 in the setting of chronic pelvic pain, narcotic dependency, and desire for surgical intervention.  She was found to have a thickened endometrial lining and given risk factors for endometrial hyperplasia and malignancy, endometrial sampling was recommended.  She underwent D&C on 02/13/2014 with final pathology revealing benign secretory endometrium.  Her pain was very difficult to manage postoperatively as described in Dr. Serita Grit note from 02/28/2014.   Her subsequent treatment history is as noted below from most recent 2 oldest.  Shortly after her visit last in our clinic, she underwent robotic hysterectomy. Other surgeries since have included left salpingo-oophorectomy for enlarged ovary and concern for torsion, as well as appendectomy.  At the time of both surgeries, extensive pelvic adhesions were noted in both surgeries required conversion from laparoscopy to open.   02/17/2021: Pelvic ultrasound exam at [facility] shows right ovary measuring 3.5 x 2.7 x 3.1 cm.  Small lesion measuring 1.7 x 1.6 x 1.3 cm which does appear to have small amount of internal flow or  possible artifact.  Impression is that there is a more solid-appearing lesion on the right ovary compared to 2 cyst like structures on prior examinations.  The cyst could represent hemorrhagic complex cyst or proteinaceous ovarian cyst.  Low degree suspicion for neoplasm considering waxing and waning appearance of the cyst on multiple prior exams. 12/17/2020: Pelvic ultrasound exam at [facility].  Right ovary measures 3.5 x 3.2 x 3.2 cm.  Mildly hypoechoic mass within the right ovary measures 1.8 x 1.6 x 1.7 cm and has associated Doppler flow.  Not significantly changed in size compared to October 2022 MRI. 12/17/2020: CT of the abdomen and pelvis shows enhancing area within the right ovary, possibly due to endometrioma, hemorrhagic cyst, or mass.  This is measured to be at least 2.3 x 1.5 cm. 10/30/2020: Pelvic MRI at [facility] shows right ovary measuring 3.5 x 1.8 x 2 cm.  Multiple follicles present.  Within the inferior aspect of the ovary, there is an involuting ovarian cyst with typical peripheral enhancement measuring 2 cm.  Within the superior aspect, there is a T1/T2 hypointense lesion measuring 1.8 x 2 cm, demonstrating diffuse enhancement postcontrast.  Nodule appears to be within the ovary but closely abuts the cecum.  In retrospect review of prior CT scans, this nodule appears to have been present since 2020 and slightly hyperdense as compared to surrounding ovarian tissue. 06/28/2020: Pelvic ultrasound exam at [facility] shows right ovary measuring 3.9 x 2.7 x 2.8 cm with a 1.4 cm simple appearing cyst. 03/06/2020: Pelvic ultrasound reveals right ovary measures 3.3 x 3.2 x 2.6 cm with a 1.8 cm dominant follicle. 06/2019: Patient presented with several months of right lower quadrant pain with episodes of nausea.  Given concern for early appendicitis, patient was taken to the operating room for an appendectomy.  This was a laparoscopic procedure converted to open secondary to significant  adhesive disease.  Findings at the time of surgery included a enlarged body and tip of the appendix.  Postoperative course was complicated by delayed healing of incision. 06/16/2019: Pelvic ultrasound exam reveals a right ovary measuring 3.1 x 2.3 x 2.6 cm with a small anechoic follicle measuring 1.2 cm.  Previously seen hemorrhagic cyst noted to have involuted. 06/15/2019: CT of the abdomen and pelvis shows borderline dilation of the tip of the appendix measuring 8 mm with very minimal inflammation.  Ill-defined 1.5 cm hypoattenuating lesion in the posterior segment 4 of the liver.  Chronic scarring along the anterior bladder and in the soft tissues of the low anterior pelvic wall. 02/04/2018: Pelvic ultrasound shows right ovary measures 7.8 x 6.4 x 5.9 cm with a 6.4 x 5.8 x 4.7 cm cyst containing multiple thin internal septations and membranes with low-level internal echoes.  Cyst thought to most likely represent a hemorrhagic cyst or endometrioma. 06/2017: Patient underwent exploratory laparoscopy converted to exploratory laparotomy, lysis of adhesion, cystoscopy, left salpingo-oophorectomy, and sigmoidoscopy in the setting of abdominal pain and concern for ovarian torsion.  Procedure was complicated by dense adhesive disease requiring conversion to open procedure with an estimated blood loss of 300 cc.  At the time of presentation, patient was noted to have an enlarged 6.6 cm left ovary with both imaging and exam findings concerning for torsion.  Findings at the time of surgery included omental and small bowel adhesions to the anterior abdominal wall.  Sigmoid colon was draped over the left ovary.  Right ovary noted to be normal in appearance and adherent to the sidewall.  Pathology from surgery showed hemorrhagic cyst and intraparenchymal ovarian hemorrhage consistent with ischemic changes.  Benign ovary. 2016: Robotic hysterectomy for abnormal uterine bleeding and chronic pelvic pain.  Per patient report,  extensive adhesions were noted at that time as well as likely endometriosis.   CEA on 10/28/2020: 3.1   Today, the patient describes a couple of years of right lower pelvic pain.  She notes that this occurs most of the time and is usually a dull and throbbing pain.  Intermittently, she will have sharp pain that causes her to catch her breath.  These episodes last for 10 or 15 minutes.  She rates them at a 9 out of 10 in terms of pain.  She sometimes will have associated symptoms including radiation to her right low back, increased heart rate, and intermittent nausea.   Patient was seen most recently by Dr. Damita Dunnings on 1/27 in the setting of continued right lower quadrant pain. Most recent pelvic ultrasound exam in our system on 2/25 shows right ovary measures 3.3 x 3.2 x 2.6 cm with a 1.8 cm dominant follicle in the ovary.  No suspicious masses.  Interval History: Freida Busman - no side effects. Tolerating well. In terms of her pelvic pain, she went to pain management clinic for a month and a half. They wouldn't allow anxiety medication, so the patient tried to stop anxiety medications which didn't go well. She's now on hold with the pain management clinic while she tries with PCP to wean off benzos and get on some anxiety medications that will work with a pain contract. Bowel function good. CBGs much improved on continue glucose monitor. CBGs mid 100s-200s. Ozempic added last week.    Past Medical/Surgical History: Past  Medical History:  Diagnosis Date   Abnormal uterine bleeding    Anxiety    Arthritis    knees   Chronic pelvic pain in female    Degenerative joint disease    back   Depression    Endometriosis of pelvis    Fibromyalgia    History of ketoacidosis    01-15-2014   PONV (postoperative nausea and vomiting)    Sinus tachycardia    on metoprolol   Type 2 diabetes mellitus ([place])    Wears contact lenses     Past Surgical History:  Procedure Laterality Date   ABDOMINAL  HYSTERECTOMY     BREAST REDUCTION SURGERY  age 19   CESAREAN SECTION  2002,  2005,  2008   DILATION AND CURETTAGE OF UTERUS N/A 02/13/2014   Procedure: DILATATION AND CURETTAGE;  Surgeon: Everitt Amber, MD;  Location: [facility];  Service: Gynecology;  Laterality: N/A;   LEFT OOPHORECTOMY  2019   MANDIBLE SURGERY  age 70   Correct overbite   REDUCTION MAMMAPLASTY      Family History  Adopted: Yes  Problem Relation Age of Onset   Hyperlipidemia Mother    Hypertension Mother    Diabetes Mother    Breast cancer Maternal Aunt    Endometriosis Maternal Aunt    Colon cancer Neg Hx    Pancreatic cancer Neg Hx    Ovarian cancer Neg Hx    Prostate cancer Neg Hx     Social History   Socioeconomic History   Marital status: Married    Spouse name: Not on file   Number of children: Not on file   Years of education: Not on file   Highest education level: Not on file  Occupational History   Occupation: works in medical billing  Tobacco Use   Smoking status: Never   Smokeless tobacco: Never  Vaping Use   Vaping Use: Never used  Substance and Sexual Activity   Alcohol use: No   Drug use: No   Sexual activity: Yes    Birth control/protection: Surgical  Other Topics Concern   Not on file  Social History Narrative   Not on file   Social Determinants of Health   Financial Resource Strain: Not on file  Food Insecurity: Food Insecurity Present   Worried About [place] in the Last Year: Sometimes true   [place] in the Last Year: Never true  Transportation Needs: No Transportation Needs   Lack of Transportation (Medical): No   Lack of Transportation (Non-Medical): No  Physical Activity: Not on file  Stress: Not on file  Social Connections: Not on file    Current Medications:  Current Outpatient Medications:    acetaminophen (TYLENOL) 500 MG tablet, Take 1,000 mg by mouth every 6 (six) hours as needed for moderate pain., Disp: , Rfl:     ALPRAZolam (XANAX XR) 1 MG 24 hr tablet, Take 1 tablet (1 mg total) by mouth daily., Disp: 30 tablet, Rfl: 3   ALPRAZolam (XANAX) 0.5 MG tablet, Take 1 tablet (0.5 mg total) by mouth at bedtime as needed for anxiety., Disp: 30 tablet, Rfl: 3   busPIRone (BUSPAR) 10 MG tablet, Take 1 tablet (10 mg total) by mouth 2 (two) times daily. Replaces alprazolam XR., Disp: 60 tablet, Rfl: 3   Continuous Blood Gluc Sensor (FREESTYLE LIBRE 3 SENSOR) MISC, 1 Device by Does not apply route every 14 (fourteen) days. Place 1 sensor on the skin every  14 days. Use to check glucose continuously, Disp: 2 each, Rfl: 6   cyclobenzaprine (FLEXERIL) 10 MG tablet, TAKE 1 TABLET BY MOUTH THREE TIMES A DAY AS NEEDED FOR MUSCLE SPASMS (Patient taking differently: Take 10 mg by mouth 3 (three) times daily.), Disp: 270 tablet, Rfl: 1   dapagliflozin propanediol (FARXIGA) 10 MG TABS tablet, Take 1 tablet (10 mg total) by mouth daily., Disp: 90 tablet, Rfl: 3   Elagolix Sodium (ORILISSA) 150 MG TABS, Take 150 mg by mouth daily., Disp: 30 tablet, Rfl: 3   FLUoxetine (PROZAC) 40 MG capsule, Take 1 capsule (40 mg total) by mouth daily., Disp: 90 capsule, Rfl: 2   gabapentin (NEURONTIN) 600 MG tablet, TAKE 2 TABLETS (1,200 MG TOTAL) BY MOUTH 3 (THREE) TIMES DAILY., Disp: 540 tablet, Rfl: 0   insulin detemir (LEVEMIR FLEXTOUCH) 100 UNIT/ML FlexPen, Inject 32 Units into the skin daily. Increase by 2 units every 2 days if fasting blood sugar remains >150.  May use up to max of 60 units/day, Disp: 30 mL, Rfl: 2   Insulin Pen Needle (ADVOCATE INSULIN PEN NEEDLES) 33G X 4 MM MISC, 1 each by Does not apply route daily., Disp: 100 each, Rfl: 12   ketoconazole (NIZORAL) 2 % shampoo, Apply 1 application. topically 2 (two) times a week., Disp: 120 mL, Rfl: 0   metoprolol succinate (TOPROL-XL) 25 MG 24 hr tablet, TAKE 1 TABLET (25 MG TOTAL) BY MOUTH DAILY., Disp: 90 tablet, Rfl: 0   ondansetron (ZOFRAN ODT) 4 MG disintegrating tablet, Take 1 tablet  (4 mg total) by mouth every 8 (eight) hours as needed for nausea or vomiting., Disp: 20 tablet, Rfl: 0   Semaglutide,0.25 or 0.'5MG'$ /DOS, (OZEMPIC, 0.25 OR 0.5 MG/DOSE,) 2 MG/3ML SOPN, Inject 0.5 mg into the skin once a week., Disp: 3 mL, Rfl: 2  Review of Symptoms: Pertinent positives as per HPI.  Physical Exam: LMP  (LMP Unknown)  Deferred given limitations of phone visit.  Laboratory & Radiologic Studies: 05/28/21: Pelvic ultrasound IMPRESSION: 1. No right ovarian cyst. Questionable echogenic lesion within the right ovary measuring 2.7 cm with vascularity. Previous report demonstrated a 1.7 cm lesion. Consider follow-up pelvic MRI for further assessment given waxing and waning sonographic findings. 2. Hysterectomy and left oophorectomy.  Assessment & Plan: Christine Grant is a 46 y.o. woman with chronic pelvic pain in the setting of a small adnexal cyst and complex surgical history.    Reviewed ultrasound findings with the patient.  On my review, I am not totally convinced that what is being called a mass in the right ovary is in fact of ovarian origin.  We reviewed that cystic lesion seen at last ultrasound is no longer appreciated.  I recommend that we proceed with MRI in the next couple of months to better assess the adnexal region.  My guess is that the same somewhat hypointense nodule measuring up to 2 cm seen on her October 22 scan is what is again being seen now.  This was noted on outside imaging to have been present since 2020.  From a pain standpoint, the patient notes not much difference but is encouraged about the work that she did with the pain management clinic.  She is looking forward to changing her anxiety medication so that she can reestablish with the pain management clinic.  I congratulated her on significant improvement in her glycemic control.  MRI was ordered.  I have asked my office to reach out with the patient to get this scheduled.  I will call her once MRI  results are back.  I discussed the assessment and treatment plan with the patient. The patient was provided with an opportunity to ask questions and all were answered. The patient agreed with the plan and demonstrated an understanding of the instructions.   The patient was advised to call back or see an in-person evaluation if the symptoms worsen or if the condition fails to improve as anticipated.   18 minutes of total time was spent for this patient encounter, including preparation, face-to-face counseling with the patient and coordination of care, and documentation of the encounter.   Jeral Pinch, MD  Division of Gynecologic Oncology  Department of Obstetrics and Gynecology  [facility]

## 2021-06-01 ENCOUNTER — Telehealth: Payer: Self-pay

## 2021-06-01 NOTE — Telephone Encounter (Signed)
Pt aware of pelvis MRI scheduled for 08/04/21 @ 1:00pm. No restrictions required.  KJ CMA

## 2021-06-21 ENCOUNTER — Telehealth: Payer: Self-pay | Admitting: General Practice

## 2021-06-21 ENCOUNTER — Other Ambulatory Visit: Payer: Self-pay | Admitting: Family Medicine

## 2021-06-21 NOTE — Telephone Encounter (Signed)
Transition Care Management Unsuccessful Follow-up Telephone Call  Date of discharge and from where:  06/20/21 from [facility]  Attempts:  1st Attempt  Reason for unsuccessful TCM follow-up call:  Left voice message

## 2021-06-22 ENCOUNTER — Encounter: Payer: Self-pay | Admitting: Gynecologic Oncology

## 2021-06-22 ENCOUNTER — Other Ambulatory Visit: Payer: Self-pay | Admitting: Family Medicine

## 2021-06-22 DIAGNOSIS — G8929 Other chronic pain: Secondary | ICD-10-CM

## 2021-06-22 NOTE — Telephone Encounter (Signed)
Transition Care Management Unsuccessful Follow-up Telephone Call  Date of discharge and from where:  06/20/21 from [facility]  Attempts:  2nd Attempt  Reason for unsuccessful TCM follow-up call:  Left voice message

## 2021-06-23 ENCOUNTER — Telehealth: Payer: Self-pay

## 2021-06-23 NOTE — Telephone Encounter (Signed)
Attempted to contact patient regarding her mychart message. Unable to contact patient. Left message requesting return call.

## 2021-06-23 NOTE — Telephone Encounter (Signed)
Following up with Christine Grant. Per Joylene John, NP we can move up patients MRI that is scheduled for July. Patient verbalized understanding and is in agreement of this. Advised that our office will work on scheduling this and be in touch with the appointment details.

## 2021-06-23 NOTE — Telephone Encounter (Signed)
Retuning call to [place]. Patient reports her pain started Saturday morning. It is in her right lower abdomen. The pain is a constant ache and about twice an hour she has a sharp pain. She rates the constant ache 7/10 and the sharp pain a 10/10 that stops her in her tracks. She reports nausea with the sharp pain but no vomiting. Pain is worse with movement and bending over.   She has been alternating tylenol and ibuprofen as well as using a heating pad with no relief. She denies any heavy lifting prior to the pain onset. She denies fever, chills or any vaginal discharge or bleeding. She endorses normal bowel and bladder habits. She reports a slightly reduced appetite since starting ozempic 2 weeks ago. She injects ozempic once a week on Saturdays. She denies worsening symptoms after her injection on Saturday. Symptoms began Saturday morning and she injected ozempic Saturday evening. She reported injecting ozempic in her left abdomen.   Patient states she was working with pain management in May but had to stop. In order to work with them she had to stop her anxiety medications and went off them cold [place]. She decided to hold off on pain management so she can work with her PCP to wean off benzodiazepines.  Advised patient that the provider will be notified and that we may move up her MRI that is scheduled in July. Patient verbalized understanding.

## 2021-06-24 ENCOUNTER — Telehealth: Payer: Self-pay | Admitting: *Deleted

## 2021-06-24 ENCOUNTER — Other Ambulatory Visit: Payer: Self-pay | Admitting: Family Medicine

## 2021-06-24 ENCOUNTER — Encounter: Payer: Self-pay | Admitting: Family Medicine

## 2021-06-24 ENCOUNTER — Emergency Department (HOSPITAL_BASED_OUTPATIENT_CLINIC_OR_DEPARTMENT_OTHER): Payer: Managed Care, Other (non HMO)

## 2021-06-24 ENCOUNTER — Telehealth: Payer: Self-pay

## 2021-06-24 ENCOUNTER — Other Ambulatory Visit: Payer: Self-pay

## 2021-06-24 ENCOUNTER — Encounter (HOSPITAL_BASED_OUTPATIENT_CLINIC_OR_DEPARTMENT_OTHER): Payer: Self-pay | Admitting: Obstetrics and Gynecology

## 2021-06-24 ENCOUNTER — Emergency Department (HOSPITAL_BASED_OUTPATIENT_CLINIC_OR_DEPARTMENT_OTHER)
Admission: EM | Admit: 2021-06-24 | Discharge: 2021-06-24 | Disposition: A | Discharge status: Home / Self Care | Payer: Managed Care, Other (non HMO) | Attending: Emergency Medicine | Admitting: Emergency Medicine

## 2021-06-24 DIAGNOSIS — R300 Dysuria: Secondary | ICD-10-CM | POA: Insufficient documentation

## 2021-06-24 DIAGNOSIS — E119 Type 2 diabetes mellitus without complications: Secondary | ICD-10-CM | POA: Diagnosis not present

## 2021-06-24 DIAGNOSIS — Z794 Long term (current) use of insulin: Secondary | ICD-10-CM | POA: Diagnosis not present

## 2021-06-24 DIAGNOSIS — R110 Nausea: Secondary | ICD-10-CM | POA: Insufficient documentation

## 2021-06-24 DIAGNOSIS — R102 Pelvic and perineal pain: Secondary | ICD-10-CM | POA: Diagnosis not present

## 2021-06-24 DIAGNOSIS — R1031 Right lower quadrant pain: Secondary | ICD-10-CM | POA: Diagnosis present

## 2021-06-24 DIAGNOSIS — D72829 Elevated white blood cell count, unspecified: Secondary | ICD-10-CM | POA: Insufficient documentation

## 2021-06-24 LAB — URINALYSIS, ROUTINE W REFLEX MICROSCOPIC
Bilirubin Urine: NEGATIVE
Glucose, UA: >1000 mg/dL — AB
Hgb urine dipstick: NEGATIVE
Ketones, ur: NEGATIVE mg/dL
Nitrite: POSITIVE — AB
Protein, ur: NEGATIVE mg/dL
Specific Gravity, Urine: 1.025 (ref 1.005–1.030)
pH: 5 (ref 5.0–8.0)

## 2021-06-24 LAB — COMPREHENSIVE METABOLIC PANEL
ALT: 22 U/L (ref 0–44)
AST: 14 U/L — ABNORMAL LOW (ref 15–41)
Albumin: 4.6 g/dL (ref 3.5–5.0)
Alkaline Phosphatase: 60 U/L (ref 38–126)
Anion gap: 11 (ref 5–15)
BUN: 19 mg/dL (ref 6–20)
CO2: 27 mmol/L (ref 22–32)
Calcium: 10.7 mg/dL — ABNORMAL HIGH (ref 8.9–10.3)
Chloride: 99 mmol/L (ref 98–111)
Creatinine, Ser: 0.65 mg/dL (ref 0.44–1.00)
GFR, Estimated: >60 mL/min (ref >60)
Glucose, Bld: 170 mg/dL — ABNORMAL HIGH (ref 70–99)
Potassium: 3.9 mmol/L (ref 3.5–5.1)
Sodium: 137 mmol/L (ref 135–145)
Total Bilirubin: 0.4 mg/dL (ref 0.3–1.2)
Total Protein: 7.6 g/dL (ref 6.5–8.1)

## 2021-06-24 LAB — CBC
HCT: 50 % — ABNORMAL HIGH (ref 36.0–46.0)
Hemoglobin: 17 g/dL — ABNORMAL HIGH (ref 12.0–15.0)
MCH: 27.7 pg (ref 26.0–34.0)
MCHC: 34 g/dL (ref 30.0–36.0)
MCV: 81.4 fL (ref 80.0–100.0)
Platelets: 288 10*3/uL (ref 150–400)
RBC: 6.14 MIL/uL — ABNORMAL HIGH (ref 3.87–5.11)
RDW: 12 % (ref 11.5–15.5)
WBC: 7.5 10*3/uL (ref 4.0–10.5)
nRBC: 0 % (ref 0.0–0.2)

## 2021-06-24 LAB — LIPASE, BLOOD: Lipase: 19 U/L (ref 11–51)

## 2021-06-24 MED ORDER — ONDANSETRON HCL 4 MG/2ML IJ SOLN
4.0000 mg | Freq: Once | INTRAMUSCULAR | Status: AC
  Administered 2021-06-24: 4 mg via INTRAVENOUS
  Filled 2021-06-24: qty 2

## 2021-06-24 MED ORDER — LEVEMIR FLEXTOUCH 100 UNIT/ML ~~LOC~~ SOPN: 32.0000 [IU] | PEN_INJECTOR | Freq: Every day | SUBCUTANEOUS | 2 refills | Status: DC

## 2021-06-24 MED ORDER — IOHEXOL 300 MG/ML  SOLN
100.0000 mL | Freq: Once | INTRAMUSCULAR | Status: AC | PRN
  Administered 2021-06-24: 100 mL via INTRAVENOUS

## 2021-06-24 MED ORDER — OXYCODONE HCL 5 MG PO TABS: 5.0000 mg | ORAL_TABLET | ORAL | 0 refills | Status: DC | PRN

## 2021-06-24 MED ORDER — MORPHINE SULFATE (PF) 4 MG/ML IV SOLN
4.0000 mg | Freq: Once | INTRAVENOUS | Status: AC
  Administered 2021-06-24: 4 mg via INTRAVENOUS
  Filled 2021-06-24: qty 1

## 2021-06-24 MED ORDER — NITROFURANTOIN MONOHYD MACRO 100 MG PO CAPS
100.0000 mg | ORAL_CAPSULE | Freq: Two times a day (BID) | ORAL | 0 refills | Status: DC
Start: 2021-06-24 — End: 2021-07-12

## 2021-06-24 MED ORDER — SODIUM CHLORIDE 0.9 % IV BOLUS
1000.0000 mL | Freq: Once | INTRAVENOUS | Status: AC
  Administered 2021-06-24: 1000 mL via INTRAVENOUS

## 2021-06-24 MED ORDER — OXYCODONE-ACETAMINOPHEN 5-325 MG PO TABS
1.0000 | ORAL_TABLET | Freq: Once | ORAL | Status: AC
  Administered 2021-06-24: 1 via ORAL
  Filled 2021-06-24: qty 1

## 2021-06-24 NOTE — Telephone Encounter (Deleted)
Per Lenna Sciara APP fax letter to Dr [facility] office for surgery recommendations

## 2021-06-24 NOTE — ED Notes (Signed)
Pt agreeable with d/c plan as discussed by provider- this nurse has verbally reinforced d/c instructions and provided pt with written copy - pt acknowledges verbal understanding and denies any additional questions, concerns, needs- ambulatory independently at d/c with steady gait; vitals stable; no distress- escorted by spouse

## 2021-06-24 NOTE — Telephone Encounter (Signed)
Error

## 2021-06-24 NOTE — ED Triage Notes (Signed)
Patient reports to the ER for abdominal pain. Patient reports she only has one ovary but there is a possible concern for torsion due to her history. Patient reports the pain started on Saturday and has gotten worse since.

## 2021-06-24 NOTE — Telephone Encounter (Signed)
Spoke with patient this afternoon and provided her with updated MRI appointment details. MRI has been rescheduled to 07/03/21 at [facility], patient to arrive at 8:30am. Nothing to eat or drink 4 hours prior to scan. Patient verbalized understanding and is in agreement of appointment.  Patient states " I'm still in a lot of pain and its getting worse. I'm concerned that I could have an ovarian torsion since I've had one in the past. Do you think I should just push through or should I go to the ER."  Advised patient that she should be evaluated in the ER for her increasing pain and history of torsion. Patient verbalized understanding and states "[facility] to [facility] of [facility]." Instructed to call with any needs.

## 2021-06-24 NOTE — Discharge Instructions (Addendum)
Your work-up today was consistent for a urinary tract infection.  I will prescribe antibiotic to take twice a day for the next 5 days. I have also prescribed a pain medication to take as needed. There are three tablets ordered.  Although you declined pelvic ultrasound today to rule out ovarian torsion, please follow-up with your primary care provider tomorrow as we discussed for your pelvic ultrasound.  Also keep your future MRI on Tuesday for further evaluation of your symptoms.  Please do not hesitate to return to the emergency department the worrisome signs and symptoms we discussed become apparent.

## 2021-06-24 NOTE — ED Provider Notes (Signed)
[facility] EMERGENC[facility] DEPT Provider Note   CSN: [id number] Arrival date & time: 06/24/21  1536     History  Chief Complaint  Patient presents with   Abdominal Pain    Christine Grant is a 46 y.o. female.   Abdominal Pain Associated symptoms: nausea   Associated symptoms: no chest pain, no chills, no cough, no dysuria, no fever, no hematuria, no shortness of breath, no sore throat and no vomiting    46 year old female presents emergency department with complaints of right lower quadrant abdominal pain.  Patient states that symptoms began on Saturday and have gotten progressively worse today.  She has had numerous abdominal surgeries including appendectomy, hysterectomy, 3 C-sections, left oophorectomy due to ovarian torsion.  She has a known right ovarian cyst echogenic lesion measuring 2.7 cm with vascularity from 05/28/2021.  Prior pelvic ultrasound performed on 03/06/2020 indicated 1.8 cm dominant follicle of the right ovary.  Patient has been following with a [facility] outpatient who is scheduled an MRI for this Tuesday, 06/29/2021.  Patient seems not concern for torsion secondary to known adhesions of the ovary to nearby structures.  Associated symptoms include nausea and dysuria.  Denies vaginal discharge or bleeding..  Denies fever, chills, night sweats, chest pain, shortness of breath, V/D, change in bowel habits.  Past Medical History:  Diagnosis Date   Abnormal uterine bleeding    Anxiety    Arthritis    knees   Chronic pelvic pain in female    Degenerative joint disease    back   Depression    Endometriosis of pelvis    Fibromyalgia    History of ketoacidosis    01-15-2014   PONV (postoperative nausea and vomiting)    Sinus tachycardia    on metoprolol   Type 2 diabetes mellitus ([place])    Wears contact lenses     Past Surgical History:  Procedure Laterality Date   ABDOMINAL HYSTERECTOMY     BREAST REDUCTION SURGERY  age 46   CESAREAN SECTION  2002,   2005,  2008   DILATION AND CURETTAGE OF UTERUS N/A 02/13/2014   Procedure: DILATATION AND CURETTAGE;  Surgeon: Everitt Amber, MD;  Location: [facility];  Service: Gynecology;  Laterality: N/A;   LEFT OOPHORECTOMY  2019   MANDIBLE SURGERY  age 46   Correct overbite   REDUCTION MAMMAPLASTY       Home Medications Prior to Admission medications   Medication Sig Start Date End Date Taking? Authorizing Provider  insulin detemir (LEVEMIR FLEXPEN) 100 UNIT/ML FlexPen Inject 32 Units into the skin daily. Increase by 2 units every 2 days if fasting blood sugar remains >150.  May use up to max of 60 units/day 06/24/21   Luetta Nutting, DO  nitrofurantoin, macrocrystal-monohydrate, (MACROBID) 100 MG capsule Take 1 capsule (100 mg total) by mouth 2 (two) times daily. 06/24/21  Yes Dion Saucier A, PA  oxyCODONE (ROXICODONE) 5 MG immediate release tablet Take 1 tablet (5 mg total) by mouth every 4 (four) hours as needed for severe pain. 06/24/21  Yes Dion Saucier A, PA  acetaminophen (TYLENOL) 500 MG tablet Take 1,000 mg by mouth every 6 (six) hours as needed for moderate pain.    [provider]  ALPRAZolam (XANAX XR) 1 MG 24 hr tablet Take 1 tablet (1 mg total) by mouth daily. 05/25/21   Luetta Nutting, DO  ALPRAZolam Duanne Moron) 0.5 MG tablet Take 1 tablet (0.5 mg total) by mouth at bedtime as needed for anxiety.  05/25/21   Luetta Nutting, DO  busPIRone (BUSPAR) 10 MG tablet Take 1 tablet (10 mg total) by mouth 2 (two) times daily. Replaces alprazolam XR. 03/18/21   Luetta Nutting, DO  Continuous Blood Gluc Sensor (FREESTYLE LIBRE 3 SENSOR) MISC 1 Device by Does not apply route every 14 (fourteen) days. Place 1 sensor on the skin every 14 days. Use to check glucose continuously 03/18/21   Luetta Nutting, DO  cyclobenzaprine (FLEXERIL) 10 MG tablet TAKE 1 TABLET BY MOUTH THREE TIMES A DAY AS NEEDED FOR MUSCLE SPASMS 06/22/21   Luetta Nutting, DO  dapagliflozin propanediol (FARXIGA) 10 MG TABS  tablet Take 1 tablet (10 mg total) by mouth daily. 03/18/21   Luetta Nutting, DO  Elagolix Sodium (ORILISSA) 150 MG TABS Take 150 mg by mouth daily. 02/26/21   Lafonda Mosses, MD  FLUoxetine (PROZAC) 40 MG capsule Take 1 capsule (40 mg total) by mouth daily. 05/25/21   Luetta Nutting, DO  gabapentin (NEURONTIN) 600 MG tablet TAKE 2 TABLETS (1,200 MG TOTAL) BY MOUTH 3 (THREE) TIMES DAILY. 06/22/21 09/20/21  Luetta Nutting, DO  Insulin Pen Needle (ADVOCATE INSULIN PEN NEEDLES) 33G X 4 MM MISC 1 each by Does not apply route daily. 10/01/18   Gregor Hams, MD  ketoconazole (NIZORAL) 2 % shampoo Apply 1 application. topically 2 (two) times a week. 03/18/21   Luetta Nutting, DO  metoprolol succinate (TOPROL-XL) 25 MG 24 hr tablet TAKE 1 TABLET (25 MG TOTAL) BY MOUTH DAILY. 01/18/21 04/18/21  Luetta Nutting, DO  ondansetron (ZOFRAN ODT) 4 MG disintegrating tablet Take 1 tablet (4 mg total) by mouth every 8 (eight) hours as needed for nausea or vomiting. 02/05/21   Radene Gunning, MD  Semaglutide,0.25 or 0.'5MG'$ /DOS, (OZEMPIC, 0.25 OR 0.5 MG/DOSE,) 2 MG/3ML SOPN Inject 0.5 mg into the skin once a week. 05/27/21   Luetta Nutting, DO      Allergies    Toradol [ketorolac tromethamine]    Review of Systems   Review of Systems  Constitutional:  Negative for chills and fever.  HENT:  Negative for ear pain and sore throat.   Eyes:  Negative for pain and visual disturbance.  Respiratory:  Negative for cough and shortness of breath.   Cardiovascular:  Negative for chest pain and palpitations.  Gastrointestinal:  Positive for abdominal pain and nausea. Negative for abdominal distention, blood in stool and vomiting.  Genitourinary:  Negative for dysuria and hematuria.  Musculoskeletal:  Negative for arthralgias and back pain.  Skin:  Negative for color change and rash.  Neurological:  Negative for seizures and syncope.  All other systems reviewed and are negative.   Physical Exam Updated Vital Signs BP 130/77 (BP  Location: Right Arm)   Pulse 93   Temp 97.9 F (36.6 C)   Resp 16   Ht '5\' 6"'$  (1.676 m)   Wt 113.4 kg   LMP 01/14/2014   SpO2 97%   BMI 40.35 kg/m  Physical Exam Vitals and nursing note reviewed.  Constitutional:      General: She is not in acute distress.    Appearance: She is well-developed. She is obese. She is not ill-appearing, toxic-appearing or diaphoretic.  HENT:     Head: Normocephalic and atraumatic.  Eyes:     General: No scleral icterus.    Extraocular Movements: Extraocular movements intact.     Conjunctiva/sclera: Conjunctivae normal.  Cardiovascular:     Rate and Rhythm: Normal rate and regular rhythm.     Heart sounds:  No murmur heard. Pulmonary:     Effort: Pulmonary effort is normal. No respiratory distress.     Breath sounds: Normal breath sounds.  Abdominal:     Palpations: Abdomen is soft.     Tenderness: There is abdominal tenderness in the right lower quadrant.  Musculoskeletal:        General: No swelling.     Cervical back: Neck supple.  Skin:    General: Skin is warm and dry.     Capillary Refill: Capillary refill takes less than 2 seconds.  Neurological:     Mental Status: She is alert.  Psychiatric:        Mood and Affect: Mood normal.     ED Results / Procedures / Treatments   Labs (all labs ordered are listed, but only abnormal results are displayed) Labs Reviewed  COMPREHENSIVE METABOLIC PANEL - Abnormal; Notable for the following components:      Result Value   Glucose, Bld 170 (*)    Calcium 10.7 (*)    AST 14 (*)    All other components within normal limits  CBC - Abnormal; Notable for the following components:   RBC 6.14 (*)    Hemoglobin 17.0 (*)    HCT 50.0 (*)    All other components within normal limits  URINALYSIS, ROUTINE W REFLEX MICROSCOPIC - Abnormal; Notable for the following components:   Glucose, UA >1,000 (*)    Nitrite POSITIVE (*)    Leukocytes,Ua SMALL (*)    Bacteria, UA RARE (*)    All other components  within normal limits  LIPASE, BLOOD    EKG None  Radiology CT Abdomen Pelvis W Contrast  Result Date: 06/24/2021 CLINICAL DATA:  Right lower quadrant abdominal pain. EXAM: CT ABDOMEN AND PELVIS WITH CONTRAST TECHNIQUE: Multidetector CT imaging of the abdomen and pelvis was performed using the standard protocol following bolus administration of intravenous contrast. RADIATION DOSE REDUCTION: This exam was performed according to the departmental dose-optimization program which includes automated exposure control, adjustment of the mA and/or kV according to patient size and/or use of iterative reconstruction technique. CONTRAST:  138m OMNIPAQUE IOHEXOL 300 MG/ML  SOLN COMPARISON:  June 16, 2019. FINDINGS: Lower chest: No acute abnormality. Hepatobiliary: No focal liver abnormality is seen. No gallstones, gallbladder wall thickening, or biliary dilatation. Pancreas: Unremarkable. No pancreatic ductal dilatation or surrounding inflammatory changes. Spleen: Normal in size without focal abnormality. Adrenals/Urinary Tract: Adrenal glands are unremarkable. Kidneys are normal, without renal calculi, focal lesion, or hydronephrosis. Bladder is unremarkable. Stomach/Bowel: The stomach appears normal. There is no evidence of bowel obstruction or inflammation. Stool seen throughout the colon. The appendix is not clearly visualized and potentially has been surgically removed. Vascular/Lymphatic: No significant vascular findings are present. No enlarged abdominal or pelvic lymph nodes. Reproductive: Status post hysterectomy and left oophorectomy. Right ovary is unremarkable. Other: No hernia or ascites is noted. Postsurgical changes are seen involving the lower anterior abdominal wall. Musculoskeletal: No acute or significant osseous findings. IMPRESSION: No acute abnormality seen in the abdomen or pelvis. Electronically Signed   By: JMarijo ConceptionM.D.   On: 06/24/2021 21:22    Procedures Procedures     Medications Ordered in ED Medications  sodium chloride 0.9 % bolus 1,000 mL (0 mLs Intravenous Stopped 06/24/21 2047)  morphine (PF) 4 MG/ML injection 4 mg (4 mg Intravenous Given 06/24/21 1945)  ondansetron (ZOFRAN) injection 4 mg (4 mg Intravenous Given 06/24/21 1945)  iohexol (OMNIPAQUE) 300 MG/ML solution 100 mL (100  mLs Intravenous Contrast Given 06/24/21 2104)  oxyCODONE-acetaminophen (PERCOCET/ROXICET) 5-325 MG per tablet 1 tablet (1 tablet Oral Given 06/24/21 2147)    ED Course/ Medical Decision Making/ A&P Clinical Course as of 06/24/21 2300  Thu Jun 24, 2021  1907 I discussed at length with patient regarding our inability to perform a pelvic ultrasound or an MRI given her lack of imaging studies available and the risks that would be present if this is indeed torsion involving loss of ovary and possible septic complication.  She knowledge understanding and wanted pain medication and CT of her abdomen and pelvis.  She declined transfer to facility with proper imaging studies. [CR]  2138 I discussed at length again with patient regarding inability of CT abdomen pelvis to rule out ovarian torsion, and she elected to go home with outpatient follow-up tomorrow with pelvic ultrasound. She acknowledged understanding that if it is torsion, her ovary with no longer be viable in the morning.  She still declined. [CR]    Clinical Course User Index [CR] Wilnette Kales, PA                           Medical Decision Making Amount and/or Complexity of Data Reviewed Labs: ordered. Radiology: ordered.  Risk Prescription drug management.   This patient presents to the ED for concern of abdominal pain, this involves an extensive number of treatment options, and is a complaint that carries with it a high risk of complications and morbidity.  The differential diagnosis includes The causes of generalized abdominal pain include but are not limited to AAA, mesenteric ischemia, appendicitis,  diverticulitis, DKA, gastritis, gastroenteritis, AMI, nephrolithiasis, pancreatitis, peritonitis, adrenal insufficiency,lead poisoning, iron toxicity, intestinal ischemia, constipation, UTI,SBO/LBO, splenic rupture, biliary disease, IBD, IBS, PUD, or hepatitis. Ectopic pregnancy, ovarian torsion, PID.    Co morbidities that complicate the patient evaluation  Obesity, prior ovarian torsion, diabetes 2, 5 myalgia, chronic pelvic pain, anxiety   Additional history obtained:  Additional history obtained from pelvic ultrasound from 05/28/2021 External records from outside source obtained and reviewed including indicating size of known lesion as well as vascularity.   Lab Tests:  I Ordered, and personally interpreted labs.  The pertinent results include: UA significant for positive nitrite, small leukocytes, 6-10 WBC, and rare bacteria with 0-5 squamous cell.   Imaging Studies ordered:  I ordered imaging studies including CT abdomen pelvis with contrast I independently visualized and interpreted imaging which showed no acute abnormality I agree with the radiologist interpretation   Cardiac Monitoring: / EKG:  The patient was maintained on a cardiac monitor.  I personally viewed and interpreted the cardiac monitored which showed an underlying rhythm of: Sinus rhythm   Consultations Obtained:  N/a   Problem List / ED Course / Critical interventions / Medication management  Pelvic pain  I ordered medication including morphine and Norco for pain, 1 L of normal saline for rehydration  Reevaluation of the patient after these medicines showed that the patient improved I have reviewed the patients home medicines and have made adjustments as needed   Social Determinants of Health:  Chronic opiate dependence, denies tobacco use.   Test / Admission - Considered:  Pelvic pain Vitals signs significant within normal range and stable throughout visit. Laboratory/imaging studies  significant for: Urinary tract infection but otherwise normal.  As discussed in the ED course, I discussed with the patient and husband regarding insufficiency CT exam both before and after exam was performed  regarding specificity and sensitivity for ovarian torsion.  They declined transfer for pelvic ultrasound multiple times.  She agreed to understanding the risks. Difficult to ascertain whether or not this is acute on chronic pelvic pain or just sequela of chronic pelvic pain.  Patient elected to do outpatient ultrasound with her family medicine doctor.  Antibiotics were prescribed for recurrent urinary tract infection. Worrisome signs and symptoms were discussed with the patient, and the patient acknowledged understanding to return to the ED if noticed. Patient was stable upon discharge.          Final Clinical Impression(s) / ED Diagnoses Final diagnoses:  Pelvic pain    Rx / DC Orders ED Discharge Orders          Ordered    nitrofurantoin, macrocrystal-monohydrate, (MACROBID) 100 MG capsule  2 times daily        06/24/21 2141    oxyCODONE (ROXICODONE) 5 MG immediate release tablet  Every 4 hours PRN        06/24/21 2141              Wilnette Kales, [place] 06/24/21 [id number]    Sherwood Gambler, MD 06/24/21 2315

## 2021-06-24 NOTE — ED Notes (Signed)
Patient transported to CT via stretcher escorted by CT tech   

## 2021-06-25 ENCOUNTER — Other Ambulatory Visit: Payer: Self-pay | Admitting: Family Medicine

## 2021-06-28 ENCOUNTER — Telehealth: Payer: Self-pay | Admitting: General Practice

## 2021-06-28 NOTE — Telephone Encounter (Signed)
Transition Care Management Unsuccessful Follow-up Telephone Call  Date of discharge and from where:  06/24/21 from [facility]  Attempts:  1st Attempt  Reason for unsuccessful TCM follow-up call:  Left voice message

## 2021-06-28 NOTE — Telephone Encounter (Signed)
Transition Care Management Unsuccessful Follow-up Telephone Call  Date of discharge and from where:  06/20/21 from [facility]  Attempts:  3rd Attempt  Reason for unsuccessful TCM follow-up call:  Left voice message

## 2021-06-29 ENCOUNTER — Ambulatory Visit (INDEPENDENT_AMBULATORY_CARE_PROVIDER_SITE_OTHER): Payer: Managed Care, Other (non HMO)

## 2021-06-29 DIAGNOSIS — N83299 Other ovarian cyst, unspecified side: Secondary | ICD-10-CM

## 2021-06-29 MED ORDER — GADOBUTROL 1 MMOL/ML IV SOLN
10.0000 mL | Freq: Once | INTRAVENOUS | Status: AC | PRN
  Administered 2021-06-29: 10 mL via INTRAVENOUS

## 2021-06-30 NOTE — Telephone Encounter (Signed)
Transition Care Management Unsuccessful Follow-up Telephone Call  Date of discharge and from where:  06/24/21 from [facility]  Attempts:  2nd Attempt  Reason for unsuccessful TCM follow-up call:  Left voice message

## 2021-07-01 ENCOUNTER — Other Ambulatory Visit: Payer: Self-pay

## 2021-07-01 ENCOUNTER — Telehealth: Payer: Self-pay

## 2021-07-01 ENCOUNTER — Ambulatory Visit
Admission: RE | Admit: 2021-07-01 | Discharge: 2021-07-01 | Disposition: A | Discharge status: Home / Self Care | Payer: Self-pay | Source: Ambulatory Visit | Attending: Gynecologic Oncology | Admitting: Gynecologic Oncology

## 2021-07-01 ENCOUNTER — Telehealth: Payer: Self-pay | Admitting: Gynecologic Oncology

## 2021-07-01 DIAGNOSIS — R109 Unspecified abdominal pain: Secondary | ICD-10-CM

## 2021-07-01 NOTE — Telephone Encounter (Signed)
Returning call to patients husband Liliane Channel. Left message stating that Dr. Berline Lopes will call and follow up with them after clinic today.

## 2021-07-01 NOTE — Telephone Encounter (Signed)
Patients husband called regarding recent MRI results. Patient has seen results and is having a lot of anxiety. Would like to speak with Dr. Berline Lopes.  Advised patients husband that the message will be given to Dr. Berline Lopes. She is currently in clinic but will follow up as soon as possible.

## 2021-07-01 NOTE — Telephone Encounter (Signed)
Transition Care Management Unsuccessful Follow-up Telephone Call  Date of discharge and from where:  06/24/21 from [facility]  Attempts:  3rd Attempt  Reason for unsuccessful TCM follow-up call:  Left voice message

## 2021-07-03 ENCOUNTER — Ambulatory Visit (HOSPITAL_COMMUNITY): Payer: Managed Care, Other (non HMO)

## 2021-07-05 ENCOUNTER — Telehealth: Payer: Self-pay | Admitting: Gynecologic Oncology

## 2021-07-05 DIAGNOSIS — N83299 Other ovarian cyst, unspecified side: Secondary | ICD-10-CM

## 2021-07-12 ENCOUNTER — Ambulatory Visit (INDEPENDENT_AMBULATORY_CARE_PROVIDER_SITE_OTHER): Payer: Managed Care, Other (non HMO) | Admitting: Family Medicine

## 2021-07-12 ENCOUNTER — Encounter: Payer: Self-pay | Admitting: Family Medicine

## 2021-07-12 VITALS — BP 126/85 | HR 135 | Ht 66.0 in | Wt 254.0 lb

## 2021-07-12 DIAGNOSIS — E669 Obesity, unspecified: Secondary | ICD-10-CM | POA: Diagnosis not present

## 2021-07-12 DIAGNOSIS — F411 Generalized anxiety disorder: Secondary | ICD-10-CM

## 2021-07-12 DIAGNOSIS — E1169 Type 2 diabetes mellitus with other specified complication: Secondary | ICD-10-CM | POA: Diagnosis not present

## 2021-07-12 LAB — POCT GLYCOSYLATED HEMOGLOBIN (HGB A1C): HbA1c, POC (controlled diabetic range): 9.4 % — AB (ref 0.0–7.0)

## 2021-07-12 MED ORDER — BUSPIRONE HCL 10 MG PO TABS: 10.0000 mg | ORAL_TABLET | Freq: Three times a day (TID) | ORAL | 3 refills | Status: DC

## 2021-07-12 MED ORDER — SEMAGLUTIDE (1 MG/DOSE) 4 MG/3ML ~~LOC~~ SOPN: 1.0000 mg | PEN_INJECTOR | SUBCUTANEOUS | 1 refills | Status: DC

## 2021-07-12 NOTE — Patient Instructions (Signed)
Increase your buspar to three times per day.  Increase ozempic to '1mg'$  weekly.  See me again in 3-4 months.

## 2021-07-13 NOTE — Assessment & Plan Note (Signed)
She has weaned from alprazolam as she is planning on restarting with pain clinic.  She will continue fluoxetine at current strength.  Increasing BuSpar to 10 mg 3 times daily.

## 2021-07-13 NOTE — Progress Notes (Signed)
Christine Grant - 46 y.o. female MRN [id number]  Date of birth: May 09, 1975  Subjective Chief Complaint  Patient presents with   Hypertension    HPI Christine Grant is a 46 year old female here today for follow-up visit.  She has been using continuous glucose monitor to check her glucose readings.  Overall readings have been better.  She is taking Levemir twice daily as well as Ozempic weekly.  She is tolerating Ozempic well so far.  She has not had any hypoglycemic episodes.  She has weaned herself from alprazolam.  Has follow-up visit with pain clinic in the next couple weeks.  Some increased anxiety however fluoxetine and BuSpar seem to be working pretty well.  She would like to increase her BuSpar to 3 times daily.  ROS:  A comprehensive ROS was completed and negative except as noted per HPI      Allergies  Allergen Reactions   Toradol [Ketorolac Tromethamine] Other (See Comments)    Chest tightness, flushed     Past Medical History:  Diagnosis Date   Abnormal uterine bleeding    Anxiety    Arthritis    knees   Chronic pelvic pain in female    Degenerative joint disease    back   Depression    Endometriosis of pelvis    Fibromyalgia    History of ketoacidosis    01-15-2014   PONV (postoperative nausea and vomiting)    Sinus tachycardia    on metoprolol   Type 2 diabetes mellitus ([place])    Wears contact lenses     Past Surgical History:  Procedure Laterality Date   ABDOMINAL HYSTERECTOMY     BREAST REDUCTION SURGERY  age 13   CESAREAN SECTION  2002,  2005,  2008   DILATION AND CURETTAGE OF UTERUS N/A 02/13/2014   Procedure: DILATATION AND CURETTAGE;  Surgeon: Everitt Amber, MD;  Location: [facility];  Service: Gynecology;  Laterality: N/A;   LEFT OOPHORECTOMY  2019   MANDIBLE SURGERY  age 66   Correct overbite   REDUCTION MAMMAPLASTY      Social History   Socioeconomic History   Marital status: Married    Spouse name: Not on file   Number of  children: Not on file   Years of education: Not on file   Highest education level: Not on file  Occupational History   Occupation: works in medical billing  Tobacco Use   Smoking status: Never   Smokeless tobacco: Never  Vaping Use   Vaping Use: Never used  Substance and Sexual Activity   Alcohol use: No   Drug use: No   Sexual activity: Yes    Birth control/protection: Surgical  Other Topics Concern   Not on file  Social History Narrative   Not on file   Social Determinants of Health   Financial Resource Strain: Not on file  Food Insecurity: Food Insecurity Present (02/05/2021)   Hunger Vital Sign    Worried About Running Out of Food in the Last Year: Sometimes true    Ran Out of Food in the Last Year: Never true  Transportation Needs: No Transportation Needs (02/05/2021)   PRAPARE - Hydrologist (Medical): No    Lack of Transportation (Non-Medical): No  Physical Activity: Not on file  Stress: Not on file  Social Connections: Not on file    Family History  Adopted: Yes  Problem Relation Age of Onset   Hyperlipidemia Mother    Hypertension  Mother    Diabetes Mother    Breast cancer Maternal Aunt    Endometriosis Maternal Aunt    Colon cancer Neg Hx    Pancreatic cancer Neg Hx    Ovarian cancer Neg Hx    Prostate cancer Neg Hx     Health Maintenance  Topic Date Due   OPHTHALMOLOGY EXAM  10/10/2021 (Originally 08/02/1986)   URINE MICROALBUMIN  10/10/2021 (Originally 12/23/2015)   COVID-19 Vaccine (3 - Moderna series) 10/10/2021 (Originally 05/08/2019)   Hepatitis C Screening  10/10/2021 (Originally 08/02/1994)   HIV Screening  10/10/2021 (Originally 08/02/1991)   INFLUENZA VACCINE  08/10/2021   HEMOGLOBIN A1C  01/12/2022   FOOT EXAM  05/26/2022   TETANUS/TDAP  01/25/2023   COLONOSCOPY (Pts 45-40yr Insurance coverage will need to be confirmed)  10/30/2027   HPV VACCINES  Aged Out      ----------------------------------------------------------------------------------------------------------------------------------------------------------------------------------------------------------------- Physical Exam BP 126/85 (BP Location: Left Arm, Patient Position: Sitting, Cuff Size: Large)   Pulse (!) 135   Ht '5\' 6"'$  (1.676 m)   Wt 254 lb (115.2 kg)   LMP 01/14/2014   SpO2 96%   BMI 41.00 kg/m   Physical Exam Constitutional:      Appearance: Normal appearance.  Eyes:     General: No scleral icterus. Cardiovascular:     Rate and Rhythm: Normal rate and regular rhythm.  Musculoskeletal:     Cervical back: Neck supple.  Neurological:     Mental Status: She is alert.  Psychiatric:        Mood and Affect: Mood normal.        Behavior: Behavior normal.     ------------------------------------------------------------------------------------------------------------------------------------------------------------------------------------------------------------------- Assessment and Plan  Diabetes mellitus type 2 in obese Lab Results  Component Value Date   HGBA1C 9.4 (A) 07/12/2021  Diabetes has improved but still remains uncontrolled.  Increasing Ozempic to 1 mg weekly.  Continue to work on dietary change.  GAD (generalized anxiety disorder) She has weaned from alprazolam as she is planning on restarting with pain clinic.  She will continue fluoxetine at current strength.  Increasing BuSpar to 10 mg 3 times daily.   Meds ordered this encounter  Medications   Semaglutide, 1 MG/DOSE, 4 MG/3ML SOPN    Sig: Inject 1 mg as directed once a week.    Dispense:  9 mL    Refill:  1   busPIRone (BUSPAR) 10 MG tablet    Sig: Take 1 tablet (10 mg total) by mouth 3 (three) times daily. Replaces alprazolam XR.    Dispense:  90 tablet    Refill:  3    Return in about 3 months (around 10/12/2021) for T2DM.    This visit occurred during the SARS-CoV-2 public health  emergency.  Safety protocols were in place, including screening questions prior to the visit, additional usage of staff PPE, and extensive cleaning of exam room while observing appropriate contact time as indicated for disinfecting solutions.

## 2021-07-13 NOTE — Assessment & Plan Note (Signed)
Lab Results  Component Value Date   HGBA1C 9.4 (A) 07/12/2021  Diabetes has improved but still remains uncontrolled.  Increasing Ozempic to 1 mg weekly.  Continue to work on dietary change.

## 2021-07-14 ENCOUNTER — Telehealth: Payer: Self-pay

## 2021-07-14 ENCOUNTER — Other Ambulatory Visit: Payer: Self-pay | Admitting: Family Medicine

## 2021-07-14 DIAGNOSIS — E1159 Type 2 diabetes mellitus with other circulatory complications: Secondary | ICD-10-CM

## 2021-07-14 DIAGNOSIS — E669 Obesity, unspecified: Secondary | ICD-10-CM

## 2021-07-14 MED ORDER — METOPROLOL SUCCINATE ER 25 MG PO TB24: 25.0000 mg | ORAL_TABLET | Freq: Every day | ORAL | 1 refills | Status: DC

## 2021-07-14 MED ORDER — SEMAGLUTIDE (1 MG/DOSE) 4 MG/3ML ~~LOC~~ SOPN: 1.0000 mg | PEN_INJECTOR | SUBCUTANEOUS | 1 refills | Status: DC

## 2021-07-14 NOTE — Telephone Encounter (Signed)
Pt has received prior auth Approval on Ozempic '1mg'$  pen injectors.   Effective dt: 07/14/2021 - 07/14/2022

## 2021-07-20 ENCOUNTER — Telehealth: Payer: Self-pay | Admitting: General Practice

## 2021-07-20 NOTE — Telephone Encounter (Signed)
Transition Care Management Unsuccessful Follow-up Telephone Call  Date of discharge and from where:  07/19/21 from [facility]  Attempts:  1st Attempt  Reason for unsuccessful TCM follow-up call:  Left voice message

## 2021-07-21 ENCOUNTER — Encounter: Payer: Self-pay | Admitting: Family Medicine

## 2021-07-21 NOTE — Telephone Encounter (Signed)
Transition Care Management Unsuccessful Follow-up Telephone Call  Date of discharge and from where:  07/19/21 from [facility]  Attempts:  2nd Attempt  Reason for unsuccessful TCM follow-up call:  Left voice message

## 2021-07-23 ENCOUNTER — Other Ambulatory Visit: Payer: Self-pay

## 2021-07-23 DIAGNOSIS — E1169 Type 2 diabetes mellitus with other specified complication: Secondary | ICD-10-CM

## 2021-07-23 MED ORDER — SEMAGLUTIDE (1 MG/DOSE) 4 MG/3ML ~~LOC~~ SOPN: 1.0000 mg | PEN_INJECTOR | SUBCUTANEOUS | 1 refills | Status: DC

## 2021-07-26 NOTE — Telephone Encounter (Signed)
Transition Care Management Unsuccessful Follow-up Telephone Call  Date of discharge and from where:  07/19/21 from [facility]  Attempts:  3rd Attempt  Reason for unsuccessful TCM follow-up call:  Left voice message

## 2021-07-29 ENCOUNTER — Other Ambulatory Visit: Payer: Self-pay

## 2021-07-29 DIAGNOSIS — E1169 Type 2 diabetes mellitus with other specified complication: Secondary | ICD-10-CM

## 2021-07-29 MED ORDER — SEMAGLUTIDE (1 MG/DOSE) 4 MG/3ML ~~LOC~~ SOPN: 1.0000 mg | PEN_INJECTOR | SUBCUTANEOUS | 1 refills | Status: DC

## 2021-07-30 ENCOUNTER — Telehealth: Payer: Self-pay | Admitting: *Deleted

## 2021-07-30 NOTE — Telephone Encounter (Signed)
Per Dr Christine Grant patient scheduled for a MRI scan in December at [facility]. Patient called and given the appt date/time

## 2021-08-04 ENCOUNTER — Other Ambulatory Visit (HOSPITAL_COMMUNITY): Payer: Managed Care, Other (non HMO)

## 2021-08-05 ENCOUNTER — Other Ambulatory Visit: Payer: Self-pay | Admitting: Family Medicine

## 2021-08-20 ENCOUNTER — Other Ambulatory Visit: Payer: Self-pay | Admitting: Family Medicine

## 2021-08-20 ENCOUNTER — Other Ambulatory Visit: Payer: Self-pay | Admitting: Obstetrics and Gynecology

## 2021-08-20 DIAGNOSIS — N83299 Other ovarian cyst, unspecified side: Secondary | ICD-10-CM

## 2021-08-20 MED ORDER — ONDANSETRON 4 MG PO TBDP: 4.0000 mg | ORAL_TABLET | Freq: Three times a day (TID) | ORAL | 0 refills | Status: DC | PRN

## 2021-08-20 MED ORDER — GABAPENTIN 600 MG PO TABS
1200.0000 mg | ORAL_TABLET | Freq: Three times a day (TID) | ORAL | 0 refills | Status: DC
Start: 2021-08-20 — End: 2021-10-20

## 2021-08-25 ENCOUNTER — Other Ambulatory Visit: Payer: Self-pay | Admitting: Gynecologic Oncology

## 2021-08-25 ENCOUNTER — Telehealth: Payer: Self-pay

## 2021-08-25 DIAGNOSIS — Z8742 Personal history of other diseases of the female genital tract: Secondary | ICD-10-CM

## 2021-08-25 MED ORDER — ORILISSA 150 MG PO TABS: 150.0000 mg | ORAL_TABLET | Freq: Every day | ORAL | 3 refills | Status: DC

## 2021-08-25 NOTE — Telephone Encounter (Signed)
Pt's husband called stating pt needs a refill on the [place]. The pharmacy of choice is [facility]. Jule Ser  Advised him Dr.Tucker is in the OR today but she would respond in between cases. He is aware I will call once refill is sent in.  He voiced an understanding

## 2021-08-25 NOTE — Telephone Encounter (Signed)
Pt is aware, via voicemail, Rx has been sent to pharmacy

## 2021-08-25 NOTE — Progress Notes (Signed)
Refill sent in per Dr. Berline Lopes and per pt request for [facility].

## 2021-09-30 ENCOUNTER — Telehealth: Payer: Self-pay

## 2021-09-30 NOTE — Telephone Encounter (Signed)
Initiated Prior authorization KIC:[id number] 1 MG/DOSE) '4MG'$ /3ML pen-injectors Via: Covermymeds Case/Key:[id number] Status: approved  as of 09/30/21 Reason:Start Date:07/14/2021;Coverage End Date:07/14/2022 Notified Pt via: Mychart

## 2021-10-19 ENCOUNTER — Other Ambulatory Visit: Payer: Self-pay | Admitting: Family Medicine

## 2021-10-19 DIAGNOSIS — G8929 Other chronic pain: Secondary | ICD-10-CM

## 2021-10-25 ENCOUNTER — Ambulatory Visit: Payer: Managed Care, Other (non HMO) | Admitting: Family Medicine

## 2021-11-22 ENCOUNTER — Other Ambulatory Visit: Payer: Self-pay | Admitting: Obstetrics and Gynecology

## 2021-11-22 DIAGNOSIS — N83299 Other ovarian cyst, unspecified side: Secondary | ICD-10-CM

## 2021-11-22 MED ORDER — ONDANSETRON 4 MG PO TBDP: 4.0000 mg | ORAL_TABLET | Freq: Three times a day (TID) | ORAL | 0 refills | Status: DC | PRN

## 2021-12-10 ENCOUNTER — Encounter: Payer: Self-pay | Admitting: Family Medicine

## 2021-12-10 ENCOUNTER — Other Ambulatory Visit: Payer: Self-pay | Admitting: Obstetrics and Gynecology

## 2021-12-10 DIAGNOSIS — N83299 Other ovarian cyst, unspecified side: Secondary | ICD-10-CM

## 2021-12-10 NOTE — Telephone Encounter (Signed)
Need appt or okay to write?

## 2021-12-10 NOTE — Telephone Encounter (Signed)
Unable to write note without her being evaluated.

## 2021-12-13 MED ORDER — ONDANSETRON 4 MG PO TBDP: 4.0000 mg | ORAL_TABLET | Freq: Three times a day (TID) | ORAL | 0 refills | Status: DC | PRN

## 2021-12-14 ENCOUNTER — Encounter: Payer: Self-pay | Admitting: Family Medicine

## 2021-12-14 ENCOUNTER — Ambulatory Visit: Payer: Managed Care, Other (non HMO) | Admitting: Family Medicine

## 2021-12-14 VITALS — BP 143/90 | HR 117 | Ht 66.0 in | Wt 247.0 lb

## 2021-12-14 DIAGNOSIS — E669 Obesity, unspecified: Secondary | ICD-10-CM

## 2021-12-14 DIAGNOSIS — E781 Pure hyperglyceridemia: Secondary | ICD-10-CM

## 2021-12-14 DIAGNOSIS — F5101 Primary insomnia: Secondary | ICD-10-CM | POA: Diagnosis not present

## 2021-12-14 DIAGNOSIS — I152 Hypertension secondary to endocrine disorders: Secondary | ICD-10-CM

## 2021-12-14 DIAGNOSIS — E1159 Type 2 diabetes mellitus with other circulatory complications: Secondary | ICD-10-CM | POA: Diagnosis not present

## 2021-12-14 DIAGNOSIS — E1169 Type 2 diabetes mellitus with other specified complication: Secondary | ICD-10-CM

## 2021-12-14 DIAGNOSIS — G4700 Insomnia, unspecified: Secondary | ICD-10-CM | POA: Insufficient documentation

## 2021-12-14 HISTORY — DX: Insomnia, unspecified: G47.00

## 2021-12-14 LAB — POCT GLYCOSYLATED HEMOGLOBIN (HGB A1C): Hemoglobin A1C: 10 % — AB (ref 4.0–5.6)

## 2021-12-14 LAB — POCT UA - MICROALBUMIN
Creatinine, POC: 50 mg/dL
Microalbumin Ur, POC: 10 mg/L

## 2021-12-14 MED ORDER — QUETIAPINE FUMARATE 50 MG PO TABS: 50.0000 mg | ORAL_TABLET | Freq: Every day | ORAL | 2 refills | Status: DC

## 2021-12-14 MED ORDER — NYSTATIN 100000 UNIT/GM EX CREA: 1.0000 | TOPICAL_CREAM | Freq: Two times a day (BID) | CUTANEOUS | 3 refills | Status: DC

## 2021-12-14 MED ORDER — TIRZEPATIDE 5 MG/0.5ML ~~LOC~~ SOAJ: 5.0000 mg | SUBCUTANEOUS | 0 refills | Status: DC

## 2021-12-14 MED ORDER — TIRZEPATIDE 7.5 MG/0.5ML ~~LOC~~ SOAJ: 7.5000 mg | SUBCUTANEOUS | 0 refills | Status: DC

## 2021-12-14 MED ORDER — TIRZEPATIDE 10 MG/0.5ML ~~LOC~~ SOAJ: 10.0000 mg | SUBCUTANEOUS | 0 refills | Status: DC

## 2021-12-14 NOTE — Progress Notes (Signed)
Hartley Ramdass - 46 y.o. female MRN [id number]  Date of birth: 11-25-1975  Subjective Chief Complaint  Patient presents with   Diabetes    HPI Christine Grant is a 46 year old female here today for follow-up visit.  Reports she is feeling okay.  She did have a bout of gastroenteritis last week.  She missed a few days where he would like a note to excuse her from this.  She does report that diarrhea resolved yesterday.  Appetite is returning.  She does skip her Ozempic last week due to nausea and vomiting.  She has been taking Ozempic consistently except for last week.  Overall she is tolerating this pretty well.  She has not been monitoring blood sugars at home.  She denies symptoms related to increased blood sugar at this time.  Denies hypoglycemia.  She would like to add Seroquel back on to help with insomnia.  This worked pretty well for her in the past.  She is requesting a refill of nystatin cream.  She does get recurrent yeast dermatitis around pannus and intertriginous areas.  ROS:  A comprehensive ROS was completed and negative except as noted per HPI  Allergies  Allergen Reactions   Toradol [Ketorolac Tromethamine] Other (See Comments)    Chest tightness, flushed     Past Medical History:  Diagnosis Date   Abnormal uterine bleeding    Anxiety    Arthritis    knees   Chronic pelvic pain in female    Degenerative joint disease    back   Depression    Endometriosis of pelvis    Fibromyalgia    History of ketoacidosis    01-15-2014   PONV (postoperative nausea and vomiting)    Sinus tachycardia    on metoprolol   Type 2 diabetes mellitus ([place])    Wears contact lenses     Past Surgical History:  Procedure Laterality Date   ABDOMINAL HYSTERECTOMY     BREAST REDUCTION SURGERY  age 50   CESAREAN SECTION  2002,  2005,  2008   DILATION AND CURETTAGE OF UTERUS N/A 02/13/2014   Procedure: DILATATION AND CURETTAGE;  Surgeon: Everitt Amber, MD;  Location: [place];  Service: Gynecology;  Laterality: N/A;   LEFT OOPHORECTOMY  2019   MANDIBLE SURGERY  age 23   Correct overbite   REDUCTION MAMMAPLASTY      Social History   Socioeconomic History   Marital status: Married    Spouse name: Not on file   Number of children: Not on file   Years of education: Not on file   Highest education level: Not on file  Occupational History   Occupation: works in medical billing  Tobacco Use   Smoking status: Never   Smokeless tobacco: Never  Vaping Use   Vaping Use: Never used  Substance and Sexual Activity   Alcohol use: No   Drug use: No   Sexual activity: Yes    Birth control/protection: Surgical  Other Topics Concern   Not on file  Social History Narrative   Not on file   Social Determinants of Health   Financial Resource Strain: Not on file  Food Insecurity: Food Insecurity Present (02/05/2021)   Hunger Vital Sign    Worried About Running Out of Food in the Last Year: Sometimes true    Ran Out of Food in the Last Year: Never true  Transportation Needs: No Transportation Needs (02/05/2021)   PRAPARE - Transportation    Lack of  Transportation (Medical): No    Lack of Transportation (Non-Medical): No  Physical Activity: Not on file  Stress: Not on file  Social Connections: Not on file    Family History  Adopted: Yes  Problem Relation Age of Onset   Hyperlipidemia Mother    Hypertension Mother    Diabetes Mother    Breast cancer Maternal Aunt    Endometriosis Maternal Aunt    Colon cancer Neg Hx    Pancreatic cancer Neg Hx    Ovarian cancer Neg Hx    Prostate cancer Neg Hx     Health Maintenance  Topic Date Due   OPHTHALMOLOGY EXAM  02/10/2022 (Originally 08/02/1986)   COVID-19 Vaccine (3 - 2023-24 season) 02/10/2022 (Originally 09/10/2021)   INFLUENZA VACCINE  04/10/2022 (Originally 08/10/2021)   Hepatitis C Screening  12/15/2022 (Originally 08/02/1994)   HIV Screening  12/15/2022 (Originally 08/02/1991)   FOOT EXAM   05/26/2022   HEMOGLOBIN A1C  06/15/2022   Diabetic kidney evaluation - GFR measurement  06/25/2022   Diabetic kidney evaluation - Urine ACR  12/15/2022   DTaP/Tdap/Td (2 - Td or Tdap) 01/25/2023   COLONOSCOPY (Pts 45-6yr Insurance coverage will need to be confirmed)  10/30/2027   HPV VACCINES  Aged Out     ----------------------------------------------------------------------------------------------------------------------------------------------------------------------------------------------------------------- Physical Exam BP (!) 143/90 (BP Location: Left Arm, Patient Position: Sitting, Cuff Size: Large)   Pulse (!) 117   Ht '5\' 6"'$  (1.676 m)   Wt 247 lb (112 kg)   LMP 01/14/2014   SpO2 97%   BMI 39.87 kg/m   Physical Exam Constitutional:      Appearance: Normal appearance.  HENT:     Head: Normocephalic and atraumatic.  Eyes:     General: No scleral icterus. Cardiovascular:     Rate and Rhythm: Normal rate and regular rhythm.  Musculoskeletal:     Cervical back: Neck supple.  Neurological:     Mental Status: She is alert.  Psychiatric:        Mood and Affect: Mood normal.        Behavior: Behavior normal.     ------------------------------------------------------------------------------------------------------------------------------------------------------------------------------------------------------------------- Assessment and Plan  Hypertension associated with diabetes ([place] Blood pressure elevated today.  Recommend working on dietary changes clinical blood pressure control.  Recommend monitoring at home over the next couple weeks and send in readings.  Diabetes mellitus type 2 in obese Diabetes is not well controlled at this time.  [facility].  Titrate as tolerated and based on response.  Insomnia Adding Seroquel 50 mg nightly as needed back on.   Meds ordered this  encounter  Medications   tirzepatide (MOUNJARO) 5 MG/0.5ML Pen    Sig: Inject 5 mg into the skin once a week. Increase to 7.5 mg after 4 weeks    Dispense:  2 mL    Refill:  0   tirzepatide (MOUNJARO) 7.5 MG/0.5ML Pen    Sig: Inject 7.5 mg into the skin once a week. Increase to '10mg'$  after 4 weeks    Dispense:  2 mL    Refill:  0   tirzepatide (MOUNJARO) 10 MG/0.5ML Pen    Sig: Inject 10 mg into the skin once a week.    Dispense:  6 mL    Refill:  0   QUEtiapine (SEROQUEL) 50 MG tablet    Sig: Take 1 tablet (50 mg total) by mouth at bedtime.    Dispense:  90 tablet  Refill:  2   nystatin cream (MYCOSTATIN)    Sig: Apply 1 Application topically 2 (two) times daily.    Dispense:  30 g    Refill:  3    Return in about 3 months (around 03/15/2022) for T2DM.    This visit occurred during the SARS-CoV-2 public health emergency.  Safety protocols were in place, including screening questions prior to the visit, additional usage of staff PPE, and extensive cleaning of exam room while observing appropriate contact time as indicated for disinfecting solutions.

## 2021-12-14 NOTE — Assessment & Plan Note (Signed)
Blood pressure elevated today.  Recommend working on dietary changes clinical blood pressure control.  Recommend monitoring at home over the next couple weeks and send in readings.

## 2021-12-14 NOTE — Assessment & Plan Note (Signed)
Diabetes is not well controlled at this time.  So she presented to [facility] for better blood glucose control as well as help with weight loss.  Titrate as tolerated and based on response.

## 2021-12-14 NOTE — Assessment & Plan Note (Signed)
Adding Seroquel 50 mg nightly as needed back on.

## 2021-12-14 NOTE — Patient Instructions (Addendum)
Let's change ozempic to mounjaro.  Start at '5mg'$  weekly then increase to 7.5 after 1 month, then '10mg'$  after an additional month.

## 2021-12-15 ENCOUNTER — Other Ambulatory Visit: Payer: Self-pay

## 2021-12-15 ENCOUNTER — Encounter: Payer: Self-pay | Admitting: Family Medicine

## 2021-12-15 DIAGNOSIS — E1169 Type 2 diabetes mellitus with other specified complication: Secondary | ICD-10-CM

## 2021-12-15 MED ORDER — ADVOCATE INSULIN PEN NEEDLES 33G X 4 MM MISC: 1.0000 | Freq: Every day | 12 refills | Status: AC

## 2021-12-17 ENCOUNTER — Telehealth: Payer: Self-pay

## 2021-12-17 NOTE — Telephone Encounter (Signed)
Initiated Prior authorization LPN:[id number] '5MG'$ /0.5ML pen-injectors Mounjaro '10MG'$ /0.5ML pen-injectors Via: Covermymeds Case/Key:[id number] Status: approved  as of 12/17/21 Notified Pt via: Mychart

## 2021-12-21 ENCOUNTER — Telehealth: Payer: Self-pay

## 2021-12-21 NOTE — Telephone Encounter (Signed)
Initiated Prior authorization JAS:[id number] '5MG'$ /0.5ML pen-injectors Mounjaro '5MG'$ /0.5ML pen-injectors Via: Covermymeds Case/Key:[id number] Status: approved as of 12/21/21 Reason:Authorization Expiration Date: 12/16/2022 Notified Pt via: Mychart   Initiated Prior authorization HAL:[id number] '10MG'$ /0.5ML pen-injectors Via: Covermymeds Case/Key:[id number] Status: approved as of 12/21/21 Reason:Coverage Start Date:12/17/2021;Coverage End Date:12/17/2022 Notified Pt via: Mychart

## 2021-12-27 ENCOUNTER — Ambulatory Visit (INDEPENDENT_AMBULATORY_CARE_PROVIDER_SITE_OTHER): Payer: Managed Care, Other (non HMO)

## 2021-12-27 DIAGNOSIS — N83299 Other ovarian cyst, unspecified side: Secondary | ICD-10-CM | POA: Diagnosis not present

## 2021-12-27 MED ORDER — GADOBUTROL 1 MMOL/ML IV SOLN
10.0000 mL | Freq: Once | INTRAVENOUS | Status: AC | PRN
  Administered 2021-12-27: 10 mL via INTRAVENOUS

## 2021-12-28 ENCOUNTER — Other Ambulatory Visit: Payer: Self-pay | Admitting: Family Medicine

## 2021-12-28 DIAGNOSIS — G8929 Other chronic pain: Secondary | ICD-10-CM

## 2021-12-31 ENCOUNTER — Telehealth: Payer: Self-pay | Admitting: Gynecologic Oncology

## 2021-12-31 DIAGNOSIS — Z8742 Personal history of other diseases of the female genital tract: Secondary | ICD-10-CM

## 2021-12-31 DIAGNOSIS — G8929 Other chronic pain: Secondary | ICD-10-CM

## 2021-12-31 DIAGNOSIS — N83299 Other ovarian cyst, unspecified side: Secondary | ICD-10-CM

## 2021-12-31 NOTE — Telephone Encounter (Signed)
Pain pretty well controlled now - on patch for long-acting and Dilaudid as needed.  Discussed MRI findings.  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  Mass itself is overall similar in size without change in character.  Reviewed options including moving forward with scheduling surgery in the near future, testing hormone levels, and repeating an MRI in 4-6 months.  The patient would like to avoid surgery if at all possible.  I think that is reasonable.  She will think about getting hormone levels and will call me back after the new year to let me know if she would like to have blood test done.  Otherwise, I will order an MRI of the pelvis to be performed in 4-6 months.  Jeral Pinch MD Gynecologic Oncology

## 2022-01-04 ENCOUNTER — Telehealth: Payer: Self-pay | Admitting: *Deleted

## 2022-01-04 ENCOUNTER — Encounter: Payer: Self-pay | Admitting: Gynecologic Oncology

## 2022-01-04 NOTE — Telephone Encounter (Signed)
Per Dr Berline Lopes patient scheduled for a MRI on 4/22

## 2022-01-05 ENCOUNTER — Telehealth: Payer: Managed Care, Other (non HMO) | Admitting: Gynecologic Oncology

## 2022-01-05 ENCOUNTER — Encounter: Payer: Self-pay | Admitting: Gynecologic Oncology

## 2022-01-06 ENCOUNTER — Telehealth: Payer: Self-pay | Admitting: Gynecologic Oncology

## 2022-01-06 ENCOUNTER — Inpatient Hospital Stay: Payer: Managed Care, Other (non HMO) | Attending: Gynecologic Oncology | Admitting: Gynecologic Oncology

## 2022-01-06 NOTE — Telephone Encounter (Signed)
Called patient. Let her know I will try her again after surgery this evening. If I miss her again, offered that I can call between 12-1 tomorrow or after 4pm - asked her to call my office in the am to let them know which time she prefers.  Jeral Pinch MD Gynecologic Oncology

## 2022-01-07 ENCOUNTER — Telehealth: Payer: Self-pay | Admitting: Gynecologic Oncology

## 2022-01-07 DIAGNOSIS — N83299 Other ovarian cyst, unspecified side: Secondary | ICD-10-CM

## 2022-01-07 NOTE — Telephone Encounter (Signed)
Returned patient's call.  She had multiple questions about our last conversation.  We reviewed findings on MRI again.  There has been no change in size of this mostly solid-appearing mass.  Discussed possibility that it has some imaging characteristics that raise the possibility of a Sertoli-Leydig tumor.  We discussed that these are frequently benign but can be malignant.  Given the very slow growth of her tumor, this appears to be an indolent process.  We had discussed the possibility of getting hormone levels to help [place] with planning.  She is interested in getting this done.  I will place orders for testosterone and estradiol.  The patient will call next week to have my office schedule a lab appointment.  We are planning on a repeat MRI in 4 months.  The patient is very understanding of her complex surgical history and the risk with surgery.  At this time.  She wishes to avoid surgery if possible and safe to do so.  Jeral Pinch MD Gynecologic Oncology

## 2022-01-10 ENCOUNTER — Other Ambulatory Visit: Payer: Self-pay | Admitting: Family Medicine

## 2022-01-12 ENCOUNTER — Other Ambulatory Visit: Payer: Self-pay | Admitting: Family Medicine

## 2022-01-12 MED ORDER — GABAPENTIN 600 MG PO TABS: 1200.0000 mg | ORAL_TABLET | Freq: Three times a day (TID) | ORAL | 0 refills | Status: DC

## 2022-01-13 ENCOUNTER — Encounter: Payer: Self-pay | Admitting: Family Medicine

## 2022-01-13 MED ORDER — TIRZEPATIDE 10 MG/0.5ML ~~LOC~~ SOAJ: 10.0000 mg | SUBCUTANEOUS | 0 refills | Status: DC

## 2022-01-22 ENCOUNTER — Other Ambulatory Visit: Payer: Self-pay | Admitting: Family Medicine

## 2022-01-24 ENCOUNTER — Encounter: Payer: Self-pay | Admitting: Family Medicine

## 2022-01-26 ENCOUNTER — Ambulatory Visit
Admission: EM | Admit: 2022-01-26 | Discharge: 2022-01-26 | Disposition: A | Discharge status: Home / Self Care | Payer: Managed Care, Other (non HMO) | Attending: Urgent Care | Admitting: Urgent Care

## 2022-01-26 DIAGNOSIS — Z23 Encounter for immunization: Secondary | ICD-10-CM | POA: Diagnosis not present

## 2022-01-26 DIAGNOSIS — W540XXA Bitten by dog, initial encounter: Secondary | ICD-10-CM | POA: Diagnosis not present

## 2022-01-26 DIAGNOSIS — S61431A Puncture wound without foreign body of right hand, initial encounter: Secondary | ICD-10-CM | POA: Diagnosis not present

## 2022-01-26 DIAGNOSIS — S61011A Laceration without foreign body of right thumb without damage to nail, initial encounter: Secondary | ICD-10-CM | POA: Diagnosis not present

## 2022-01-26 MED ORDER — TETANUS-DIPHTH-ACELL PERTUSSIS 5-2.5-18.5 LF-MCG/0.5 IM SUSY
0.5000 mL | PREFILLED_SYRINGE | Freq: Once | INTRAMUSCULAR | Status: AC
  Administered 2022-01-26: 0.5 mL via INTRAMUSCULAR

## 2022-01-26 MED ORDER — AMOXICILLIN-POT CLAVULANATE 875-125 MG PO TABS: 1.0000 | ORAL_TABLET | Freq: Two times a day (BID) | ORAL | 0 refills | Status: AC

## 2022-01-26 NOTE — Discharge Instructions (Addendum)
We updated your tetanus vaccination today. You confirmed that the dog is up-to-date on rabies and thus defer rabies vaccination today.  Please start taking Augmentin twice daily with food for the next 7 days to prevent infection.  3 Steri-Strips were placed over the wound on your thumb.  Please keep this area clean and dry.  The Steri-Strips will fall off usually within 72 hours. Please wear the thumb spica splint to prevent excessive range of motion to allow your wound to heal.  Follow-up if you develop any redness, pain, warmth, or drainage around the wound.

## 2022-01-26 NOTE — ED Provider Notes (Signed)
[facility]    CSN: [id number] Arrival date & time: 01/26/22  1726      History   Chief Complaint Chief Complaint  Patient presents with   Animal Bite    HPI Christine Grant is a 47 y.o. female.   47 year old female presents today due to concern of a dog bite that occurred roughly 2 hours ago.  She states that one of her neighbors dogs was walking aimlessly through her backyard.  Patient had her 90 pound [place] outside and tried to go take him in before there was a TEFL teacher.  Patient states that the neighbors dog bit her right hand over the thenar eminence/right thumb.  Patient's last tetanus vaccination was in 2015.  Patient reports full range of motion of thumb with no disturbance to sensation.  Patient is a diabetic but reports good glucose control.  Patient states the neighbors dog is up-to-date on rabies vaccines.   Animal Bite   Past Medical History:  Diagnosis Date   Abnormal uterine bleeding    Anxiety    Arthritis    knees   Chronic pelvic pain in female    Degenerative joint disease    back   Depression    Endometriosis of pelvis    Fibromyalgia    History of ketoacidosis    01-15-2014   PONV (postoperative nausea and vomiting)    Sinus tachycardia    on metoprolol   Type 2 diabetes mellitus ([place])    Wears contact lenses     Patient Active Problem List   Diagnosis Date Noted   Insomnia 12/14/2021   Pelvic adhesive disease 11/05/2020   Complex ovarian cyst 03/03/2020   Influenza-like illness 02/14/2020   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   S/P appendectomy 06/28/2019   S/P unilateral salpingo-oophorectomy 07/12/2017   Anemia 07/01/2015   Sinus tachycardia 05/27/2015   History of migraine headaches 05/27/2015   Hypertension associated with diabetes ([place]) 02/02/2015   Primary osteoarthritis of both hips 01/22/2015   Degenerative disc disease, cervical 01/22/2015   Hypertriglyceridemia 12/24/2014   Morbid obesity ([place]) 07/09/2014    Fibromyalgia muscle pain 10/07/2013   Diabetes mellitus type 2 in obese ([place]) 10/07/2013   GAD (generalized anxiety disorder) 10/07/2013   Endometriosis of pelvis 10/07/2013    Past Surgical History:  Procedure Laterality Date   ABDOMINAL HYSTERECTOMY     BREAST REDUCTION SURGERY  age 47   CESAREAN SECTION  2002,  2005,  2008   DILATION AND CURETTAGE OF UTERUS N/A 02/13/2014   Procedure: DILATATION AND CURETTAGE;  Surgeon: Everitt Amber, MD;  Location: [place];  Service: Gynecology;  Laterality: N/A;   LEFT OOPHORECTOMY  2019   MANDIBLE SURGERY  age 33   Correct overbite   REDUCTION MAMMAPLASTY      OB History     Gravida  4   Para  3   Term  3   Preterm      AB      Living  3      SAB      IAB      Ectopic      Multiple      Live Births               Home Medications    Prior to Admission medications   Medication Sig Start Date End Date Taking? Authorizing Provider  amoxicillin-clavulanate (AUGMENTIN) 875-125 MG tablet Take 1 tablet by mouth 2 (two) times daily with a meal for  7 days. 01/26/22 02/02/22 Yes Mayrene Bastarache L, PA  acetaminophen (TYLENOL) 500 MG tablet Take 1,000 mg by mouth every 6 (six) hours as needed for moderate pain.    [provider]  busPIRone (BUSPAR) 10 MG tablet TAKE 1 TABLET (10 MG TOTAL) BY MOUTH 3 (THREE) TIMES DAILY. REPLACES ALPRAZOLAM XR. 08/05/21   Luetta Nutting, DO  Continuous Blood Gluc Sensor (FREESTYLE LIBRE 3 SENSOR) MISC 1 Device by Does not apply route every 14 (fourteen) days. Place 1 sensor on the skin every 14 days. Use to check glucose continuously 03/18/21   Luetta Nutting, DO  cyclobenzaprine (FLEXERIL) 10 MG tablet TAKE 1 TABLET BY MOUTH THREE TIMES A DAY AS NEEDED FOR MUSCLE SPASM 12/28/21   Luetta Nutting, DO  dapagliflozin propanediol (FARXIGA) 10 MG TABS tablet Take 1 tablet (10 mg total) by mouth daily. 03/18/21   Luetta Nutting, DO  Elagolix Sodium (ORILISSA) 150 MG TABS Take 150 mg by  mouth daily. 08/25/21   Cross, Lenna Sciara D, NP  FLUoxetine (PROZAC) 40 MG capsule TAKE 1 CAPSULE (40 MG TOTAL) BY MOUTH DAILY. 01/24/22   Luetta Nutting, DO  gabapentin (NEURONTIN) 600 MG tablet Take 2 tablets (1,200 mg total) by mouth 3 (three) times daily. 01/12/22 04/12/22  Luetta Nutting, DO  insulin detemir (LEVEMIR FLEXPEN) 100 UNIT/ML FlexPen Inject 32 Units into the skin daily. Increase by 2 units every 2 days if fasting blood sugar remains >150.  May use up to max of 60 units/day 06/24/21   Luetta Nutting, DO  Insulin Pen Needle (ADVOCATE INSULIN PEN NEEDLES) 33G X 4 MM MISC 1 each by Does not apply route daily. 12/15/21   Luetta Nutting, DO  ketoconazole (NIZORAL) 2 % shampoo APPLY 1 APPLICATION. TOPICALLY 2 (TWO) TIMES A WEEK. 06/28/21   Luetta Nutting, DO  metoprolol succinate (TOPROL-XL) 25 MG 24 hr tablet Take 1 tablet (25 mg total) by mouth daily. 07/14/21   Luetta Nutting, DO  nystatin cream (MYCOSTATIN) Apply 1 Application topically 2 (two) times daily. 12/14/21   Luetta Nutting, DO  ondansetron (ZOFRAN ODT) 4 MG disintegrating tablet Take 1 tablet (4 mg total) by mouth every 8 (eight) hours as needed for nausea or vomiting. 12/13/21   Radene Gunning, MD  QUEtiapine (SEROQUEL) 50 MG tablet Take 1 tablet (50 mg total) by mouth at bedtime. 12/14/21   Luetta Nutting, DO  tirzepatide [facility]) 10 MG/0.5ML Pen Inject 10 mg into the skin once a week. 01/13/22   Luetta Nutting, DO  tirzepatide [facility]) 7.5 MG/0.5ML Pen INJECT 7.5 MG INTO THE SKIN ONCE A WEEK. INCREASE TO '10MG'$  AFTER 4 WEEKS 01/11/22   Luetta Nutting, DO    Family History Family History  Adopted: Yes  Problem Relation Age of Onset   Hyperlipidemia Mother    Hypertension Mother    Diabetes Mother    Breast cancer Maternal Aunt    Endometriosis Maternal Aunt    Colon cancer Neg Hx    Pancreatic cancer Neg Hx    Ovarian cancer Neg Hx    Prostate cancer Neg Hx     Social History Social History   Tobacco Use   Smoking status: Never    Smokeless tobacco: Never  Vaping Use   Vaping Use: Never used  Substance Use Topics   Alcohol use: No   Drug use: No     Allergies   Toradol [ketorolac tromethamine]   Review of Systems Review of Systems As per HPI  Physical Exam Triage Vital Signs ED Triage Vitals  Enc Vitals Group     BP 01/26/22 1753 (!) 155/93     Pulse Rate 01/26/22 1753 (!) 119     Resp 01/26/22 1753 18     Temp 01/26/22 1753 99.1 F (37.3 C)     Temp Source 01/26/22 1753 Oral     SpO2 01/26/22 1753 97 %     Weight 01/26/22 1757 250 lb 14.4 oz (113.8 kg)     Height --      Head Circumference --      Peak Flow --      Pain Score --      Pain Loc --      Pain Edu? --      Excl. in [place]? --    No data found.  Updated Vital Signs BP (!) 155/93 (BP Location: Left Arm)   Pulse (!) 119   Temp 99.1 F (37.3 C) (Oral)   Resp 18   Wt 250 lb 14.4 oz (113.8 kg)   LMP 01/14/2014   SpO2 97%   BMI 40.50 kg/m   Visual Acuity Right Eye Distance:   Left Eye Distance:   Bilateral Distance:    Right Eye Near:   Left Eye Near:    Bilateral Near:     Physical Exam Vitals and nursing note reviewed. Exam conducted with a chaperone present.  Constitutional:      General: She is not in acute distress.    Appearance: Normal appearance. She is not ill-appearing or toxic-appearing.  HENT:     Head: Normocephalic and atraumatic.  Cardiovascular:     Rate and Rhythm: Tachycardia present.  Pulmonary:     Effort: Pulmonary effort is normal. No respiratory distress.  Musculoskeletal:        General: Signs of injury (1" superficial laceration to R volar thumb without FB. Neurovascularly intact) present. No swelling or deformity. Normal range of motion.     Comments: Single puncture wound to thenar eminence R hand No surrounding erythema. No FB. FROM wrist and hand without swelling  Skin:    General: Skin is warm and dry.     Capillary Refill: Capillary refill takes less than 2 seconds.      Findings: Bruising (minimal surrounding puncture) present.  Neurological:     General: No focal deficit present.     Mental Status: She is alert and oriented to person, place, and time.     Sensory: No sensory deficit.     Motor: No weakness.      UC Treatments / Results  Labs (all labs ordered are listed, but only abnormal results are displayed) Labs Reviewed - No data to display  EKG   Radiology No results found.  Procedures Procedures (including critical care time)  Medications Ordered in UC Medications  Tdap (BOOSTRIX) injection 0.5 mL (0.5 mLs Intramuscular Given 01/26/22 1845)    Initial Impression / Assessment and Plan / UC Course  I have reviewed the triage vital signs and the nursing notes.  Pertinent labs & imaging results that were available during my care of the patient were reviewed by me and considered in my medical decision making (see chart for details).     Puncture wound R hand without FB -will start empiric antibiotics with Augmentin twice daily for the next 7 days.  Puncture wound appears to be superficial. Laceration right thumb -this was cleansed extensively in the office.  Discussed risks versus benefits of leaving open.  Patient is right-handed and due to extent of  wound, will closed with 3 Steri-Strips in office.  We also put patient in a thumb spica brace to prevent excessive range of motion to prevent further wound healing. Dog bite -treatment as above.  Dog is up-to-date on rabies vaccination, patient defers need for this. Tetanus vaccination -updated in clinic today.   Final Clinical Impressions(s) / UC Diagnoses   Final diagnoses:  Puncture wound of right hand without foreign body, initial encounter  Laceration of right thumb without foreign body without damage to nail, initial encounter  Dog bite, initial encounter  Need for prophylactic vaccination with combined diphtheria-tetanus-pertussis (DTP) vaccine     Discharge Instructions       We updated your tetanus vaccination today. You confirmed that the dog is up-to-date on rabies and thus defer rabies vaccination today.  Please start taking Augmentin twice daily with food for the next 7 days to prevent infection.  3 Steri-Strips were placed over the wound on your thumb.  Please keep this area clean and dry.  The Steri-Strips will fall off usually within 72 hours. Please wear the thumb spica splint to prevent excessive range of motion to allow your wound to heal.  Follow-up if you develop any redness, pain, warmth, or drainage around the wound.    ED Prescriptions     Medication Sig Dispense Auth. Provider   amoxicillin-clavulanate (AUGMENTIN) 875-125 MG tablet Take 1 tablet by mouth 2 (two) times daily with a meal for 7 days. 14 tablet Nihal Doan L, [place]      PDMP not reviewed this encounter.   Chaney Malling, [place] 01/26/22 1846

## 2022-01-26 NOTE — ED Triage Notes (Signed)
Patient presents to UC for dog bite to right thumb States a neighborhood dog bit her when attempting to stop a Training and development officer. States unsure of dogs vaccine history, concerned with possible infection. No rabies vaccines in the past or any live vaccines in the past 4 months.

## 2022-02-01 ENCOUNTER — Encounter: Payer: Self-pay | Admitting: Family Medicine

## 2022-03-07 ENCOUNTER — Encounter: Payer: Self-pay | Admitting: Gynecologic Oncology

## 2022-03-08 ENCOUNTER — Telehealth: Payer: Self-pay

## 2022-03-08 ENCOUNTER — Telehealth: Payer: Self-pay | Admitting: Oncology

## 2022-03-08 NOTE — Telephone Encounter (Signed)
[place] called back and would like to schedule surgery on 04/13/22. Advised her that we will call her back with preop appointments.

## 2022-03-08 NOTE — Telephone Encounter (Signed)
I called the patient. She had insurance change with recent job change which unfortunately meant that her visit was cancelled with pain clinic. She is scheduled in April and is hoping to get in sooner. Even without having prn oral medication for break through pain, she thinks RLQ pain has worsened. We discussed options again and she would like to proceed with scheduling surgery. She knows this will likely not be until April and that I will ask someone from the office to reach out to her over the next week to schedule a date.  She understands that the right ovarian lesion is small and is very likely not causing her pain. She voices understanding of risk related to surgery and known adhesions. She also understand that surgery could worsen pain and cause formation of additional adhesions.

## 2022-03-08 NOTE — Telephone Encounter (Signed)
LVM for patient to call office regarding follow up information from the Mychart message she sent to Dr. Berline Lopes yesterday regarding pain.

## 2022-03-08 NOTE — Telephone Encounter (Signed)
Pt returned call to follow up fromMyChart message sent yesterday.  Abdominal/Pelvic pain triage protocol reviewed with patient.  Pt states the pain from the right ovarian cyst has always been there but has gotten worse in the low right pelvic area since 2/22 (pain 9/10 on pain scale) which is why she was seen in the ER. The pain is a constant ache and at times can be sharpe (7/10 on pain scale) She states heating pad and rest along with Tylenol/Ibuprofen has helped some. No fever/chills. No diarrhea or constipation. No vaginal discharge/bleeding. Nausea on initial start of pain but not now. She did have a mild UTI in the ER which was treated with abx. She has had a decrease in appetite since starting [facility], but she is eating.   She states she is still in pain management at [facility] in [place] with Dr.Garvin, however, her insurance has changed and they can not see her until April. She was on the pain patch and had to stop it d/t chemical burn and was changed to oral Dilaudid '2mg'$ . Has been unable to continue d/t the insurance change.  Pt is aware of Dr. Berline Lopes and Joylene John NP being out of the office today but I would send them the message. She is fine with a call back tomorrow if need be.

## 2022-03-08 NOTE — Telephone Encounter (Signed)
Left a message regarding potential surgery dates in April.  Requested a return call.

## 2022-03-09 ENCOUNTER — Other Ambulatory Visit: Payer: Self-pay | Admitting: Obstetrics and Gynecology

## 2022-03-09 DIAGNOSIS — N83299 Other ovarian cyst, unspecified side: Secondary | ICD-10-CM

## 2022-03-09 MED ORDER — ONDANSETRON 4 MG PO TBDP: 4.0000 mg | ORAL_TABLET | Freq: Three times a day (TID) | ORAL | 0 refills | Status: DC | PRN

## 2022-03-15 ENCOUNTER — Encounter: Payer: Self-pay | Admitting: Family Medicine

## 2022-03-15 ENCOUNTER — Encounter: Payer: Managed Care, Other (non HMO) | Admitting: Gynecologic Oncology

## 2022-03-15 ENCOUNTER — Ambulatory Visit: Payer: Managed Care, Other (non HMO) | Admitting: Family Medicine

## 2022-03-15 VITALS — BP 116/84 | HR 137 | Ht 66.0 in | Wt 243.0 lb

## 2022-03-15 DIAGNOSIS — N83299 Other ovarian cyst, unspecified side: Secondary | ICD-10-CM

## 2022-03-15 DIAGNOSIS — I152 Hypertension secondary to endocrine disorders: Secondary | ICD-10-CM

## 2022-03-15 DIAGNOSIS — E1159 Type 2 diabetes mellitus with other circulatory complications: Secondary | ICD-10-CM

## 2022-03-15 DIAGNOSIS — M5030 Other cervical disc degeneration, unspecified cervical region: Secondary | ICD-10-CM

## 2022-03-15 DIAGNOSIS — G8929 Other chronic pain: Secondary | ICD-10-CM

## 2022-03-15 DIAGNOSIS — E669 Obesity, unspecified: Secondary | ICD-10-CM

## 2022-03-15 DIAGNOSIS — E1169 Type 2 diabetes mellitus with other specified complication: Secondary | ICD-10-CM | POA: Diagnosis not present

## 2022-03-15 DIAGNOSIS — M549 Dorsalgia, unspecified: Secondary | ICD-10-CM

## 2022-03-15 DIAGNOSIS — F411 Generalized anxiety disorder: Secondary | ICD-10-CM

## 2022-03-15 LAB — POCT GLYCOSYLATED HEMOGLOBIN (HGB A1C): HbA1c, POC (controlled diabetic range): 10.1 % — AB (ref 0.0–7.0)

## 2022-03-15 MED ORDER — FLUOXETINE HCL 40 MG PO CAPS: 40.0000 mg | ORAL_CAPSULE | Freq: Every day | ORAL | 1 refills | Status: DC

## 2022-03-15 MED ORDER — MOUNJARO 7.5 MG/0.5ML ~~LOC~~ SOAJ: SUBCUTANEOUS | 0 refills | Status: DC

## 2022-03-15 MED ORDER — BUSPIRONE HCL 10 MG PO TABS: 10.0000 mg | ORAL_TABLET | Freq: Three times a day (TID) | ORAL | 2 refills | Status: DC

## 2022-03-15 MED ORDER — TIRZEPATIDE 5 MG/0.5ML ~~LOC~~ SOAJ: 5.0000 mg | SUBCUTANEOUS | 0 refills | Status: DC

## 2022-03-15 MED ORDER — LANTUS SOLOSTAR 100 UNIT/ML ~~LOC~~ SOPN: 30.0000 [IU] | PEN_INJECTOR | Freq: Two times a day (BID) | SUBCUTANEOUS | 1 refills | Status: DC

## 2022-03-15 MED ORDER — CYCLOBENZAPRINE HCL 10 MG PO TABS: ORAL_TABLET | ORAL | 1 refills | Status: DC

## 2022-03-15 MED ORDER — FREESTYLE LIBRE 3 SENSOR MISC: 1.0000 | 6 refills | Status: DC

## 2022-03-15 MED ORDER — LANTUS SOLOSTAR 100 UNIT/ML ~~LOC~~ SOPN: 30.0000 [IU] | PEN_INJECTOR | Freq: Two times a day (BID) | SUBCUTANEOUS | 0 refills | Status: DC

## 2022-03-15 MED ORDER — TIRZEPATIDE 10 MG/0.5ML ~~LOC~~ SOAJ: 10.0000 mg | SUBCUTANEOUS | 1 refills | Status: DC

## 2022-03-15 MED ORDER — DAPAGLIFLOZIN PROPANEDIOL 10 MG PO TABS: 10.0000 mg | ORAL_TABLET | Freq: Every day | ORAL | 3 refills | Status: DC

## 2022-03-15 MED ORDER — QUETIAPINE FUMARATE 50 MG PO TABS: 50.0000 mg | ORAL_TABLET | Freq: Every day | ORAL | 2 refills | Status: DC

## 2022-03-15 MED ORDER — METOPROLOL SUCCINATE ER 25 MG PO TB24: 25.0000 mg | ORAL_TABLET | Freq: Every day | ORAL | 1 refills | Status: DC

## 2022-03-15 NOTE — Patient Instructions (Signed)
Start lantus to replace levemir Restart mounjaro. See me again in 4 months.

## 2022-03-15 NOTE — Telephone Encounter (Signed)
Left a message with preop appointments on 04/08/22 with Dr. Berline Lopes and Joylene John, NP.  Also let her know that the surgery date may need to be moved to 04/14/22 based on OR availability and that we will let her know for sure next week.

## 2022-03-17 ENCOUNTER — Other Ambulatory Visit: Payer: Self-pay | Admitting: Pharmacist

## 2022-03-17 DIAGNOSIS — E1169 Type 2 diabetes mellitus with other specified complication: Secondary | ICD-10-CM

## 2022-03-17 NOTE — Progress Notes (Signed)
Patient appearing on report for levemir usage in setting of upcoming levemir discontinuation.  Outreached patient to discuss glucose control and medication management as well as offer support during transition to alternative insulin. Left voicemail for patient to return my call at their convenience.   Larinda Buttery, PharmD Clinical Pharmacist [facility] [phone]

## 2022-03-17 NOTE — Addendum Note (Signed)
Addended by: Perlie Mayo on: 03/17/2022 03:48 PM   Modules accepted: Orders

## 2022-03-20 ENCOUNTER — Encounter: Payer: Self-pay | Admitting: Family Medicine

## 2022-03-20 NOTE — Assessment & Plan Note (Signed)
Management with chronic opioids under supervision of pain clinic.  Stable at this time.

## 2022-03-20 NOTE — Assessment & Plan Note (Signed)
Switching Levemir to Lantus as Levemir is being discontinued.  Continue this at 30 units twice per day.  Adding Mounjaro back on and titrate up as tolerated.  Continue Farxiga at current strength.  Encouraged continued dietary changes and incorporation of exercise.

## 2022-03-20 NOTE — Assessment & Plan Note (Signed)
Continue fluoxetine with BuSpar.  Seroquel nightly for associated insomnia.

## 2022-03-20 NOTE — Assessment & Plan Note (Signed)
Upcoming right oophorectomy is scheduled.

## 2022-03-20 NOTE — Assessment & Plan Note (Signed)
Blood pressure remains well-controlled.  She will continue metoprolol at current strength.

## 2022-03-20 NOTE — Progress Notes (Signed)
Christine Grant - 47 y.o. female MRN [id number]  Date of birth: 1975/04/15  Subjective Chief Complaint  Patient presents with   Diabetes    HPI Christine Grant is a 47 y.o. female here today for follow up visit.   Overall Christine Grant is doing pretty well.  Christine Grant does have upcoming surgery for right nephrectomy due to continued pain and mass on the right ovary.  Remains on Levemir 30 units twice a day.  Darcel Bayley was added previously however Christine Grant has been out of this.  Christine Grant would like to add this back on.  Christine Grant denies any episodes of hypoglycemia.  Christine Grant did like using freestyle libre to check her blood sugar previously would like to have the right colon.  Current insurance.  Lifestyle is not very active.  Christine Grant has been working on making some dietary changes.  Christine Grant remains on combination of fluoxetine, BuSpar and Seroquel for management of her depression anxiety.  Christine Grant was previously on benzodiazepines however is currently on chronic opioid therapy for pain management.  These were discontinued due to interaction of respiratory depression.  Christine Grant does report continued anxiety but overall this is manageable.  Blood pressure is currently managed with metoprolol.  Stable with this.  No significant side effects.  Denies chest pain, shortness of breath, palpitations, headaches or vision changes.   ROS:  A comprehensive ROS was completed and negative except as noted per HPI   Allergies  Allergen Reactions   Toradol [Ketorolac Tromethamine] Other (See Comments)    Chest tightness, flushed     Past Medical History:  Diagnosis Date   Abnormal uterine bleeding    Anxiety    Arthritis    knees   Chronic pelvic pain in female    Degenerative joint disease    back   Depression    Endometriosis of pelvis    Fibromyalgia    History of ketoacidosis    01-15-2014   PONV (postoperative nausea and vomiting)    Sinus tachycardia    on metoprolol   Type 2 diabetes mellitus ([place])    Wears contact lenses     Past  Surgical History:  Procedure Laterality Date   ABDOMINAL HYSTERECTOMY     BREAST REDUCTION SURGERY  age 14   CESAREAN SECTION  2002,  2005,  2008   DILATION AND CURETTAGE OF UTERUS N/A 02/13/2014   Procedure: DILATATION AND CURETTAGE;  Surgeon: Everitt Amber, MD;  Location: [place];  Service: Gynecology;  Laterality: N/A;   LEFT OOPHORECTOMY  2019   MANDIBLE SURGERY  age 64   Correct overbite   REDUCTION MAMMAPLASTY      Social History   Socioeconomic History   Marital status: Married    Spouse name: Not on file   Number of children: Not on file   Years of education: Not on file   Highest education level: Not on file  Occupational History   Occupation: works in medical billing  Tobacco Use   Smoking status: Never   Smokeless tobacco: Never  Vaping Use   Vaping Use: Never used  Substance and Sexual Activity   Alcohol use: No   Drug use: No   Sexual activity: Yes    Birth control/protection: Surgical  Other Topics Concern   Not on file  Social History Narrative   Not on file   Social Determinants of Health   Financial Resource Strain: Not on file  Food Insecurity: Food Insecurity Present (02/05/2021)   Hunger Vital Sign  Worried About Charity fundraiser in the Last Year: Sometimes true    [employer] of Food in the Last Year: Never true  Transportation Needs: No Transportation Needs (02/05/2021)   PRAPARE - Hydrologist (Medical): No    Lack of Transportation (Non-Medical): No  Physical Activity: Not on file  Stress: Not on file  Social Connections: Not on file    Family History  Adopted: Yes  Problem Relation Age of Onset   Hyperlipidemia Mother    Hypertension Mother    Diabetes Mother    Breast cancer Maternal Aunt    Endometriosis Maternal Aunt    Colon cancer Neg Hx    Pancreatic cancer Neg Hx    Ovarian cancer Neg Hx    Prostate cancer Neg Hx     Health Maintenance  Topic Date Due   OPHTHALMOLOGY EXAM   Never done   INFLUENZA VACCINE  04/10/2022 (Originally 08/10/2021)   Hepatitis C Screening  12/15/2022 (Originally 08/02/1994)   HIV Screening  12/15/2022 (Originally 08/02/1991)   COVID-19 Vaccine (3 - 2023-24 season) 03/31/2023 (Originally 09/10/2021)   FOOT EXAM  05/26/2022   Diabetic kidney evaluation - eGFR measurement  06/25/2022   HEMOGLOBIN A1C  09/15/2022   Diabetic kidney evaluation - Urine ACR  12/15/2022   COLONOSCOPY (Pts 45-41yr Insurance coverage will need to be confirmed)  10/30/2027   DTaP/Tdap/Td (3 - Td or Tdap) 01/27/2032   HPV VACCINES  Aged Out     ----------------------------------------------------------------------------------------------------------------------------------------------------------------------------------------------------------------- Physical Exam BP 116/84 (BP Location: Left Arm, Patient Position: Sitting, Cuff Size: Large)   Pulse (!) 137   Ht '5\' 6"'$  (1.676 m)   Wt 243 lb (110.2 kg)   LMP 01/14/2014   SpO2 96%   BMI 39.22 kg/m   Physical Exam Constitutional:      Appearance: Normal appearance.  Eyes:     General: No scleral icterus. Cardiovascular:     Rate and Rhythm: Normal rate and regular rhythm.  Pulmonary:     Effort: Pulmonary effort is normal.     Breath sounds: Normal breath sounds.  Neurological:     General: No focal deficit present.     Mental Status: Christine Grant is alert.  Psychiatric:        Mood and Affect: Mood normal.        Behavior: Behavior normal.     ------------------------------------------------------------------------------------------------------------------------------------------------------------------------------------------------------------------- Assessment and Plan  Hypertension associated with diabetes ([place] Blood pressure remains well-controlled.  Christine Grant will continue metoprolol at current strength.  Complex ovarian cyst Upcoming right oophorectomy is scheduled.  Diabetes mellitus type 2 in  obese Switching Levemir to Lantus as Levemir is being discontinued.  Continue this at 30 units twice per day.  Adding Mounjaro back on and titrate up as tolerated.  Continue Farxiga at current strength.  Encouraged continued dietary changes and incorporation of exercise.  GAD (generalized anxiety disorder) Continue fluoxetine with BuSpar.  Seroquel nightly for associated insomnia.  Degenerative disc disease, cervical Management with chronic opioids under supervision of pain clinic.  Stable at this time.   Meds ordered this encounter  Medications   insulin glargine (LANTUS SOLOSTAR) 100 UNIT/ML Solostar Pen    Sig: Inject 30 Units into the skin 2 (two) times daily. Lot: [id number]: 01/10/2024    Dispense:  15 mL    Refill:  0   insulin glargine (LANTUS SOLOSTAR) 100 UNIT/ML Solostar Pen    Sig: Inject 30 Units into the skin 2 (two) times daily. May  titrate to 35 units qam and qpm if needed.    Dispense:  60 mL    Refill:  1   Continuous Blood Gluc Sensor (FREESTYLE LIBRE 3 SENSOR) MISC    Sig: 1 Device by Does not apply route every 14 (fourteen) days. Place 1 sensor on the skin every 14 days. Use to check glucose continuously    Dispense:  2 each    Refill:  6   tirzepatide (MOUNJARO) 5 MG/0.5ML Pen    Sig: Inject 5 mg into the skin once a week. Increase to 7.'5mg'$  after 4 weeks    Dispense:  2 mL    Refill:  0   tirzepatide (MOUNJARO) 7.5 MG/0.5ML Pen    Sig: INJECT 7.5 MG INTO THE SKIN ONCE A WEEK. INCREASE TO '10MG'$  AFTER 4 WEEKS    Dispense:  2 mL    Refill:  0   tirzepatide (MOUNJARO) 10 MG/0.5ML Pen    Sig: Inject 10 mg into the skin once a week.    Dispense:  6 mL    Refill:  1   busPIRone (BUSPAR) 10 MG tablet    Sig: Take 1 tablet (10 mg total) by mouth 3 (three) times daily. Replaces alprazolam XR.    Dispense:  270 tablet    Refill:  2   cyclobenzaprine (FLEXERIL) 10 MG tablet    Sig: TAKE 1 TABLET BY MOUTH THREE TIMES A DAY AS NEEDED FOR MUSCLE SPASM    Dispense:   270 tablet    Refill:  1   dapagliflozin propanediol (FARXIGA) 10 MG TABS tablet    Sig: Take 1 tablet (10 mg total) by mouth daily.    Dispense:  90 tablet    Refill:  3   FLUoxetine (PROZAC) 40 MG capsule    Sig: Take 1 capsule (40 mg total) by mouth daily.    Dispense:  90 capsule    Refill:  1   metoprolol succinate (TOPROL-XL) 25 MG 24 hr tablet    Sig: Take 1 tablet (25 mg total) by mouth daily.    Dispense:  90 tablet    Refill:  1   QUEtiapine (SEROQUEL) 50 MG tablet    Sig: Take 1 tablet (50 mg total) by mouth at bedtime.    Dispense:  90 tablet    Refill:  2    Return in about 4 months (around 07/15/2022) for T2DM.    This visit occurred during the SARS-CoV-2 public health emergency.  Safety protocols were in place, including screening questions prior to the visit, additional usage of staff PPE, and extensive cleaning of exam room while observing appropriate contact time as indicated for disinfecting solutions.

## 2022-03-21 ENCOUNTER — Telehealth: Payer: Self-pay | Admitting: General Practice

## 2022-03-21 NOTE — Transitions of Care (Post Inpatient/ED Visit) (Signed)
   03/21/2022  Name: Christine Grant MRN: [id number] DOB: 15-Mar-1975  Today's TOC FU Call Status: Today's TOC FU Call Status:: Unsuccessul Call (1st Attempt) Unsuccessful Call (1st Attempt) Date: 03/21/22  Attempted to reach the patient regarding the most recent Inpatient/ED visit.  Follow Up Plan: Additional outreach attempts will be made to reach the patient to complete the Transitions of Care (Post Inpatient/ED visit) call.   Signature Tinnie Gens, RN

## 2022-03-23 ENCOUNTER — Other Ambulatory Visit: Payer: Self-pay | Admitting: Family Medicine

## 2022-03-23 MED ORDER — GABAPENTIN 600 MG PO TABS: 1200.0000 mg | ORAL_TABLET | Freq: Three times a day (TID) | ORAL | 0 refills | Status: DC

## 2022-03-23 NOTE — Transitions of Care (Post Inpatient/ED Visit) (Signed)
   03/23/2022  Name: Christine Grant MRN: [id number] DOB: 12-12-1975  Today's TOC FU Call Status: Today's TOC FU Call Status:: Unsuccessful Call (2nd Attempt) Unsuccessful Call (1st Attempt) Date: 03/21/22 Unsuccessful Call (2nd Attempt) Date: 03/23/22  Attempted to reach the patient regarding the most recent Inpatient/ED visit.  Follow Up Plan: Additional outreach attempts will be made to reach the patient to complete the Transitions of Care (Post Inpatient/ED visit) call.   Signature Tinnie Gens RN BSN

## 2022-03-24 ENCOUNTER — Telehealth: Payer: Self-pay

## 2022-03-24 NOTE — Telephone Encounter (Signed)
Initiated Prior authorization [id number] '5MG'$ /0.5ML pen-injectors Via: Covermymeds Case/Key:[id number] Status: Pending as of 03/24/22 Reason: Notified Pt via: Mychart

## 2022-03-25 ENCOUNTER — Telehealth: Payer: Self-pay

## 2022-03-25 ENCOUNTER — Other Ambulatory Visit: Payer: Self-pay

## 2022-03-25 ENCOUNTER — Other Ambulatory Visit: Payer: Self-pay | Admitting: Pharmacist

## 2022-03-25 DIAGNOSIS — E1169 Type 2 diabetes mellitus with other specified complication: Secondary | ICD-10-CM

## 2022-03-25 DIAGNOSIS — Z8742 Personal history of other diseases of the female genital tract: Secondary | ICD-10-CM

## 2022-03-25 MED ORDER — TIRZEPATIDE 5 MG/0.5ML ~~LOC~~ SOAJ: 5.0000 mg | SUBCUTANEOUS | 0 refills | Status: DC

## 2022-03-25 MED ORDER — MOUNJARO 7.5 MG/0.5ML ~~LOC~~ SOAJ: SUBCUTANEOUS | 0 refills | Status: DC

## 2022-03-25 MED ORDER — ORILISSA 150 MG PO TABS: 150.0000 mg | ORAL_TABLET | Freq: Every day | ORAL | 3 refills | Status: DC

## 2022-03-25 MED ORDER — TIRZEPATIDE 10 MG/0.5ML ~~LOC~~ SOAJ: 10.0000 mg | SUBCUTANEOUS | 1 refills | Status: DC

## 2022-03-25 NOTE — Transitions of Care (Post Inpatient/ED Visit) (Signed)
   03/25/2022  Name: Christine Grant MRN: [id number] DOB: 08-18-1975  Today's TOC FU Call Status: Today's TOC FU Call Status:: Unsuccessful Call (2nd Attempt) Unsuccessful Call (1st Attempt) Date: 03/21/22 Unsuccessful Call (2nd Attempt) Date: 03/23/22  Attempted to reach the patient regarding the most recent Inpatient/ED visit.  Follow Up Plan: No further outreach attempts will be made at this time. We have been unable to contact the patient.  Signature Tinnie Gens, RN

## 2022-03-25 NOTE — Progress Notes (Signed)
Contacted patient in response to referral from PCP to pharmacy services to assist with diabetes and medication management.   Outreached patient to discuss glucose control and medication management as well as offer support during transition to alternative insulin. Left voicemail for patient to return my call at their convenience. Will send MyChart.  Larinda Buttery, PharmD Clinical Pharmacist [facility] [phone]

## 2022-03-25 NOTE — Telephone Encounter (Signed)
Request for a refill received via fax from [facility] for Orilissa 150 mg.  Joylene John NP notified

## 2022-03-28 ENCOUNTER — Telehealth: Payer: Self-pay | Admitting: Oncology

## 2022-03-28 NOTE — Telephone Encounter (Signed)
Called Christine Grant and advised that we were able to schedule her surgery on 04/13/22.  She verbalized understandings and agreement.

## 2022-03-29 ENCOUNTER — Telehealth: Payer: Self-pay

## 2022-03-29 NOTE — Telephone Encounter (Signed)
Prior Authorization started through [facility] regarding Orilissa.

## 2022-03-29 NOTE — Telephone Encounter (Signed)
Prior Authorization approval received from [facility] of Orilissa 150mg . Authorization approved valid through 03/29/22-09/29/22  Approval faxed to on file pharmacy

## 2022-03-31 NOTE — Patient Instructions (Addendum)
SURGICAL WAITING ROOM VISITATION  Patients having surgery or a procedure may have no more than 2 support people in the waiting area - these visitors may rotate.    Children under the age of 9 must have an adult with them who is not the patient.  Due to an increase in RSV and influenza rates and associated hospitalizations, children ages 62 and under may not visit patients in [place].  If the patient needs to stay at the hospital during part of their recovery, the visitor guidelines for inpatient rooms apply. Pre-op nurse will coordinate an appropriate time for 1 support person to accompany patient in pre-op.  This support person may not rotate.    Please refer to the [facility] website for the visitor guidelines for Inpatients (after your surgery is over and you are in a regular room).    Your procedure is scheduled on: 04/13/22   Report to [facility] Main Entrance    Report to admitting at 6:15 AM   Call this number if you have problems the morning of surgery [phone]   Do not eat food :After Midnight.   After Midnight you may have the following liquids until 5:30 AM DAY OF SURGERY  Water Non-Citrus Juices (without pulp, NO RED-Apple, White grape, White cranberry) Black Coffee (NO MILK/CREAM OR CREAMERS, sugar ok)  Clear Tea (NO MILK/CREAM OR CREAMERS, sugar ok) regular and decaf                             Plain Jell-O (NO RED)                                           Fruit ices (not with fruit pulp, NO RED)                                     Popsicles (NO RED)                                                               Sports drinks like Gatorade (NO RED)                      If you have questions, please contact your surgeon's office.   FOLLOW BOWEL PREP AND ANY ADDITIONAL PRE OP INSTRUCTIONS YOU RECEIVED FROM YOUR SURGEON'S OFFICE!!!     Oral Hygiene is also important to reduce your risk of infection.                                     Remember - BRUSH YOUR TEETH THE MORNING OF SURGERY WITH YOUR REGULAR TOOTHPASTE  DENTURES WILL BE REMOVED PRIOR TO SURGERY PLEASE DO NOT APPLY "Poly grip" OR ADHESIVES!!!   Take these medicines the morning of surgery with A SIP OF WATER: Tylenol, Buspirone, Fluoxetine, Gabapentin, Metoprolol, Zofran  DO NOT TAKE ANY ORAL DIABETIC MEDICATIONS DAY OF YOUR SURGERY  How to Manage Your Diabetes Before and After  Surgery  Why is it important to control my blood sugar before and after surgery? Improving blood sugar levels before and after surgery helps healing and can limit problems. A way of improving blood sugar control is eating a healthy diet by:  Eating less sugar and carbohydrates  Increasing activity/exercise  Talking with your doctor about reaching your blood sugar goals High blood sugars (greater than 180 mg/dL) can raise your risk of infections and slow your recovery, so you will need to focus on controlling your diabetes during the weeks before surgery. Make sure that the doctor who takes care of your diabetes knows about your planned surgery including the date and location.  How do I manage my blood sugar before surgery? Check your blood sugar at least 4 times a day, starting 2 days before surgery, to make sure that the level is not too high or low. Check your blood sugar the morning of your surgery when you wake up and every 2 hours until you get to the Short Stay unit. If your blood sugar is less than 70 mg/dL, you will need to treat for low blood sugar: Do not take insulin. Treat a low blood sugar (less than 70 mg/dL) with  cup of clear juice (cranberry or apple), 4 glucose tablets, OR glucose gel. Recheck blood sugar in 15 minutes after treatment (to make sure it is greater than 70 mg/dL). If your blood sugar is not greater than 70 mg/dL on recheck, call [phone] for further instructions. Report your blood sugar to the short stay nurse when you get to Short Stay.  If you  are admitted to the hospital after surgery: Your blood sugar will be checked by the staff and you will probably be given insulin after surgery (instead of oral diabetes medicines) to make sure you have good blood sugar levels. The goal for blood sugar control after surgery is 80-180 mg/dL.   WHAT DO I DO ABOUT MY DIABETES MEDICATION?  Do not take oral diabetes medicines (pills) the morning of surgery  Hold Farxiga for 3 days before surgery. Last dose 04/09/22.  THE DAY BEFORE SURGERY, take morning dose of Lantus as prescribed, take 50% of evening dose.      THE MORNING OF SURGERY, take 50% of Lantus.  DO NOT TAKE THE FOLLOWING 7 DAYS PRIOR TO SURGERY: Ozempic, Wegovy, Rybelsus (Semaglutide), Byetta (exenatide), Bydureon (exenatide ER), Victoza, Saxenda (liraglutide), or Trulicity (dulaglutide) Mounjaro (Tirzepatide) Adlyxin (Lixisenatide), Polyethylene Glycol Loxenatide.  Reviewed and Endorsed by [facility] Patient Education Committee, August 2015                              You may not have any metal on your body including hair pins, jewelry, and body piercing             Do not wear make-up, lotions, powders, perfumes, or deodorant  Do not wear nail polish including gel and S&S, artificial/acrylic nails, or any other type of covering on natural nails including finger and toenails. If you have artificial nails, gel coating, etc. that needs to be removed by a nail salon please have this removed prior to surgery or surgery may need to be canceled/ delayed if the surgeon/ anesthesia feels like they are unable to be safely monitored.   Do not shave  48 hours prior to surgery.    Do not bring valuables to the hospital. [facility] IS NOT  RESPONSIBLE   FOR VALUABLES.   Contacts, glasses, dentures or bridgework may not be worn into surgery.  DO NOT [place]. PHARMACY WILL DISPENSE MEDICATIONS LISTED ON YOUR MEDICATION LIST TO YOU DURING YOUR  ADMISSION [place]!    Patients discharged on the day of surgery will not be allowed to drive home.  Someone NEEDS to stay with you for the first 24 hours after anesthesia.   Special Instructions: Bring a copy of your healthcare power of attorney and living will documents the day of surgery if you haven't scanned them before.              Please read over the following fact sheets you were given: IF [place] [id number] Schneiders    If you received a COVID test during your pre-op visit  it is requested that you wear a mask when out in public, stay away from anyone that may not be feeling well and notify your surgeon if you develop symptoms. If you test positive for Covid or have been in contact with anyone that has tested positive in the last 10 days please notify you surgeon.    [facility] - Preparing for Surgery Before surgery, you can play an important role.  Because skin is not sterile, your skin needs to be as free of germs as possible.  You can reduce the number of germs on your skin by washing with CHG (chlorahexidine gluconate) soap before surgery.  CHG is an antiseptic cleaner which kills germs and bonds with the skin to continue killing germs even after washing. Please DO NOT use if you have an allergy to CHG or antibacterial soaps.  If your skin becomes reddened/irritated stop using the CHG and inform your nurse when you arrive at Short Stay. Do not shave (including legs and underarms) for at least 48 hours prior to the first CHG shower.  You may shave your face/neck.  Please follow these instructions carefully:  1.  Shower with CHG Soap the night before surgery and the  morning of surgery.  2.  If you choose to wash your hair, wash your hair first as usual with your normal  shampoo.  3.  After you shampoo, rinse your hair and body thoroughly to remove the shampoo.                             4.  Use CHG as you would any other  liquid soap.  You can apply chg directly to the skin and wash.  Gently with a scrungie or clean washcloth.  5.  Apply the CHG Soap to your body ONLY FROM THE NECK DOWN.   Do   not use on face/ open                           Wound or open sores. Avoid contact with eyes, ears mouth and   genitals (private parts).                       Wash face,  Genitals (private parts) with your normal soap.             6.  Wash thoroughly, paying special attention to the area where your    surgery  will be performed.  7.  Thoroughly rinse your body with  warm water from the neck down.  8.  DO NOT shower/wash with your normal soap after using and rinsing off the CHG Soap.                9.  Pat yourself dry with a clean towel.            10.  Wear clean pajamas.            11.  Place clean sheets on your bed the night of your first shower and do not  sleep with pets. Day of Surgery : Do not apply any lotions/deodorants the morning of surgery.  Please wear clean clothes to the hospital/surgery center.  FAILURE TO FOLLOW THESE INSTRUCTIONS MAY RESULT IN THE CANCELLATION OF YOUR SURGERY  PATIENT SIGNATURE_________________________________  NURSE SIGNATURE__________________________________  ________________________________________________________________________ WHAT IS A BLOOD TRANSFUSION? Blood Transfusion Information  A transfusion is the replacement of blood or some of its parts. Blood is made up of multiple cells which provide different functions. Red blood cells carry oxygen and are used for blood loss replacement. White blood cells fight against infection. Platelets control bleeding. Plasma helps clot blood. Other blood products are available for specialized needs, such as hemophilia or other clotting disorders. BEFORE THE TRANSFUSION  Who gives blood for transfusions?  Healthy volunteers who are fully evaluated to make sure their blood is safe. This is blood bank blood. Transfusion therapy is the  safest it has ever been in the practice of medicine. Before blood is taken from a donor, a complete history is taken to make sure that person has no history of diseases nor engages in risky social behavior (examples are intravenous drug use or sexual activity with multiple partners). The donor's travel history is screened to minimize risk of transmitting infections, such as malaria. The donated blood is tested for signs of infectious diseases, such as HIV and hepatitis. The blood is then tested to be sure it is compatible with you in order to minimize the chance of a transfusion reaction. If you or a relative donates blood, this is often done in anticipation of surgery and is not appropriate for emergency situations. It takes many days to process the donated blood. RISKS AND COMPLICATIONS Although transfusion therapy is very safe and saves many lives, the main dangers of transfusion include:  Getting an infectious disease. Developing a transfusion reaction. This is an allergic reaction to something in the blood you were given. Every precaution is taken to prevent this. The decision to have a blood transfusion has been considered carefully by your caregiver before blood is given. Blood is not given unless the benefits outweigh the risks. AFTER THE TRANSFUSION Right after receiving a blood transfusion, you will usually feel much better and more energetic. This is especially true if your red blood cells have gotten low (anemic). The transfusion raises the level of the red blood cells which carry oxygen, and this usually causes an energy increase. The nurse administering the transfusion will monitor you carefully for complications. HOME CARE INSTRUCTIONS  No special instructions are needed after a transfusion. You may find your energy is better. Speak with your caregiver about any limitations on activity for underlying diseases you may have. SEEK MEDICAL CARE IF:  Your condition is not improving after your  transfusion. You develop redness or irritation at the intravenous (IV) site. SEEK IMMEDIATE MEDICAL CARE IF:  Any of the following symptoms occur over the next 12 hours: Shaking chills. You have a temperature by mouth above  102 F (38.9 C), not controlled by medicine. Chest, back, or muscle pain. People around you feel you are not acting correctly or are confused. Shortness of breath or difficulty breathing. Dizziness and fainting. You get a rash or develop hives. You have a decrease in urine output. Your urine turns a dark color or changes to pink, red, or brown. Any of the following symptoms occur over the next 10 days: You have a temperature by mouth above 102 F (38.9 C), not controlled by medicine. Shortness of breath. Weakness after normal activity. The white part of the eye turns yellow (jaundice). You have a decrease in the amount of urine or are urinating less often. Your urine turns a dark color or changes to pink, red, or brown. Document Released: 12/25/1999 Document Revised: 03/21/2011 Document Reviewed: 08/13/2007 [facility] Patient Information 2014 [place], [place].  _______________________________________________________________________

## 2022-03-31 NOTE — Progress Notes (Addendum)
COVID Vaccine Completed: yes  Date of COVID positive in last 90 days: no  PCP - Luetta Nutting, DO Cardiologist - cards clearance with hysterectomy   Chest x-ray - n/a EKG - 04/01/22 Epic/chart Stress Test - n/a ECHO - 2015 per pt Cardiac Cath - n/a Pacemaker/ICD device last checked: n/a Spinal Cord Stimulator: n/a  Bowel Prep - light diet day before  Sleep Study - n/a CPAP -   Fasting Blood Sugar -  Checks Blood Sugar no checks at home, waiting to get a Dexcom  Last dose of GLP1 agonist-  Mounjaro, hasn't taken in 2 months due to insurance issues GLP1 instructions:     Last dose of SGLT-2 inhibitors-  N/A SGLT-2 instructions: N/A   Blood Thinner Instructions: n/a Aspirin Instructions: Last Dose:  Activity level: Can go up a flight of stairs and perform activities of daily living without stopping and without symptoms of chest pain or shortness of breath.    Anesthesia review: chronic sinus tachycardia, BS 308, BP 155/105 and 154/104. Denies hx of HTN, denies symptoms other than 9/10 pain. Instructed to call PCP and let him know about readings today. Patient verbalized understanding.  Patient denies shortness of breath, fever, cough and chest pain at PAT appointment  Patient verbalized understanding of instructions that were given to them at the PAT appointment. Patient was also instructed that they will need to review over the PAT instructions again at home before surgery.

## 2022-04-01 ENCOUNTER — Encounter: Payer: Self-pay | Admitting: Family Medicine

## 2022-04-01 ENCOUNTER — Telehealth: Payer: Self-pay | Admitting: Surgery

## 2022-04-01 ENCOUNTER — Other Ambulatory Visit: Payer: Self-pay

## 2022-04-01 ENCOUNTER — Encounter (HOSPITAL_COMMUNITY): Payer: Self-pay

## 2022-04-01 ENCOUNTER — Encounter (HOSPITAL_COMMUNITY)
Admission: RE | Admit: 2022-04-01 | Discharge: 2022-04-01 | Disposition: A | Discharge status: Home / Self Care | Payer: Managed Care, Other (non HMO) | Source: Ambulatory Visit | Attending: Gynecologic Oncology | Admitting: Gynecologic Oncology

## 2022-04-01 VITALS — BP 154/104 | HR 105 | Temp 97.8°F | Resp 16 | Ht 66.0 in | Wt 253.0 lb

## 2022-04-01 DIAGNOSIS — E669 Obesity, unspecified: Secondary | ICD-10-CM | POA: Insufficient documentation

## 2022-04-01 DIAGNOSIS — E1169 Type 2 diabetes mellitus with other specified complication: Secondary | ICD-10-CM | POA: Insufficient documentation

## 2022-04-01 DIAGNOSIS — N83299 Other ovarian cyst, unspecified side: Secondary | ICD-10-CM | POA: Insufficient documentation

## 2022-04-01 DIAGNOSIS — Z01818 Encounter for other preprocedural examination: Secondary | ICD-10-CM | POA: Insufficient documentation

## 2022-04-01 LAB — COMPREHENSIVE METABOLIC PANEL
ALT: 30 U/L (ref 0–44)
AST: 22 U/L (ref 15–41)
Albumin: 4.2 g/dL (ref 3.5–5.0)
Alkaline Phosphatase: 76 U/L (ref 38–126)
Anion gap: 9 (ref 5–15)
BUN: 19 mg/dL (ref 6–20)
CO2: 22 mmol/L (ref 22–32)
Calcium: 9.5 mg/dL (ref 8.9–10.3)
Chloride: 101 mmol/L (ref 98–111)
Creatinine, Ser: 0.67 mg/dL (ref 0.44–1.00)
GFR, Estimated: >60 mL/min (ref >60)
Glucose, Bld: 281 mg/dL — ABNORMAL HIGH (ref 70–99)
Potassium: 4.6 mmol/L (ref 3.5–5.1)
Sodium: 132 mmol/L — ABNORMAL LOW (ref 135–145)
Total Bilirubin: 0.4 mg/dL (ref 0.3–1.2)
Total Protein: 7.3 g/dL (ref 6.5–8.1)

## 2022-04-01 LAB — CBC
HCT: 46.5 % — ABNORMAL HIGH (ref 36.0–46.0)
Hemoglobin: 15.7 g/dL — ABNORMAL HIGH (ref 12.0–15.0)
MCH: 27.7 pg (ref 26.0–34.0)
MCHC: 33.8 g/dL (ref 30.0–36.0)
MCV: 82 fL (ref 80.0–100.0)
Platelets: 250 10*3/uL (ref 150–400)
RBC: 5.67 MIL/uL — ABNORMAL HIGH (ref 3.87–5.11)
RDW: 14.2 % (ref 11.5–15.5)
WBC: 7.7 10*3/uL (ref 4.0–10.5)
nRBC: 0 % (ref 0.0–0.2)

## 2022-04-01 LAB — TYPE AND SCREEN
ABO/RH(D): A POS
Antibody Screen: NEGATIVE

## 2022-04-01 LAB — GLUCOSE, CAPILLARY: Glucose-Capillary: 308 mg/dL — ABNORMAL HIGH (ref 70–99)

## 2022-04-01 LAB — HEMOGLOBIN A1C
Hgb A1c MFr Bld: 9.9 % — ABNORMAL HIGH (ref 4.8–5.6)
Mean Plasma Glucose: 237 mg/dL

## 2022-04-01 NOTE — Telephone Encounter (Signed)
LVM to call back regarding glucose levels.

## 2022-04-04 ENCOUNTER — Telehealth: Payer: Self-pay | Admitting: General Practice

## 2022-04-04 ENCOUNTER — Telehealth: Payer: Self-pay

## 2022-04-04 ENCOUNTER — Encounter: Payer: Self-pay | Admitting: Gynecologic Oncology

## 2022-04-04 NOTE — Telephone Encounter (Signed)
Patient's spouse came to office to get Freestyle Libre CGM, thanks.

## 2022-04-04 NOTE — Telephone Encounter (Addendum)
Told Ms Burson that her Hg A1c was 9.9.  Dr. Berline Lopes is postponing her surgery for 04-13-22 as her blood sugar needs to be under control to decrease risk of complications operative and post operative complications. She just got a Libre free style glucose monitor and will be able to continuously monitor BS. She is currently on Lantus 40 mg bid. The Darcel Bayley is not currently available. Told her that she needs to send Dr. Berline Lopes a My Chart message with a print out of her blood sugars before and after meals tid. From the next 2 weeks and determine the next step. Ms Kirschenmann is upset that her surgery is being postponed as she has coordinated the surgery for 04-13-22 with her husband and children's schedules. She states that she keeps going to the ED due to the pain.  She can't sleep and and is vomiting.   Told Ms Marsicano that this information will be sent to Dr. Berline Lopes to be reviewed.

## 2022-04-04 NOTE — Progress Notes (Signed)
   Care Guide Note  04/04/2022 Name: Christine Grant MRN: [id number] DOB: November 04, 1975  Referred by: Luetta Nutting, DO Reason for referral : Care Coordination (Outreach to schedule with Pharm d )   Christine Grant is a 47 y.o. year old female who is a primary care patient of Luetta Nutting, DO. [name] was referred to the pharmacist for assistance related to DM.    Successful contact was made with the patient to discuss pharmacy services including being ready for the pharmacist to call at least 5 minutes before the scheduled appointment time, to have medication bottles and any blood sugar or blood pressure readings ready for review. The patient agreed to meet with the pharmacist via with the pharmacist via telephone visit on (date/time).  04/05/2022  [name], [place], [place] [address] Direct Dial: [phone] [name]

## 2022-04-04 NOTE — Transitions of Care (Post Inpatient/ED Visit) (Signed)
   04/04/2022  Name: Rion Haymaker MRN: [id number] DOB: [birth year]  Today's TOC FU Call Status: Unsuccessful Call (1st Attempt) Date: 04/04/22  Attempted to reach the patient regarding the most recent Inpatient/ED visit.  Follow Up Plan: Additional outreach attempts will be made to reach the patient to complete the Transitions of Care (Post Inpatient/ED visit) call.   Signature Tinnie Gens, RN BSN

## 2022-04-05 ENCOUNTER — Other Ambulatory Visit: Payer: Self-pay | Admitting: Pharmacist

## 2022-04-05 ENCOUNTER — Other Ambulatory Visit: Payer: Managed Care, Other (non HMO) | Admitting: Pharmacist

## 2022-04-05 NOTE — Progress Notes (Signed)
Outreached patient for scheduled phone appointment for medication support. No answer x2. Left voicemail for patient to return my call at their convenience.   Will also send mychart message.  Larinda Buttery, PharmD Clinical Pharmacist [facility] [phone]

## 2022-04-06 ENCOUNTER — Other Ambulatory Visit: Payer: Managed Care, Other (non HMO) | Admitting: Pharmacist

## 2022-04-06 NOTE — Patient Instructions (Signed)
Christine Grant,  Thank you for speaking with me today! As discussed:  1) Titrate insulin units by about 4 units every 2-3 days if you notice sugars >180 consistently, while you are off of the medications for surgery prep 2) Consider the "omron" brand upper arm blood pressure cuff, with a goal of <130/80 for blood pressure at home.  3) Let's keep in mind to repeat cholesterol panel bloodwork soon, as it might be helpful to consider medicine to lower cholesterol, and therefore lower risks of heart attack and stroke.  Keep up the great work otherwise with getting medications, and best wishes for your surgery! I'll plan a touchbase call in about a month, just to see if mounjaro is back in stock for you and how medicines are going after surgery. If you do get a blood pressure monitor, I'd love to hear how some of those home numbers look as well.  Below are some helpful tips with checking blood pressure:  Check your blood pressure twice weekly, and any time you have concerning symptoms like headache, chest pain, dizziness, shortness of breath, or vision changes.   Our goal is less than 130/80.  To appropriately check your blood pressure, make sure you do the following:  1) Avoid caffeine, exercise, or tobacco products for 30 minutes before checking. Empty your bladder. 2) Sit with your back supported in a flat-backed chair. Rest your arm on something flat (arm of the chair, table, etc). 3) Sit still with your feet flat on the floor, resting, for at least 5 minutes.  4) Check your blood pressure. Take 1-2 readings.  5) Write down these readings and bring with you to any provider appointments.  Bring your home blood pressure machine with you to a provider's office for accuracy comparison at least once a year.   Make sure you take your blood pressure medications before you come to any office visit, even if you were asked to fast for labs.   Take care, Christine Grant, PharmD Clinical  Pharmacist [facility] [phone]

## 2022-04-06 NOTE — Progress Notes (Signed)
04/06/2022 Name: Christine Grant MRN: [id number] DOB: [birth year]  No chief complaint on file.   Christine Grant is a 47 y.o. year old female who presented for a telephone visit.   They were referred to the pharmacist by their PCP for assistance in managing diabetes, hypertension, and hyperlipidemia.    Subjective:  Care Team: Primary Care Provider: Luetta Nutting, DO    Medication Access/Adherence  Current Pharmacy:  [id number] - [place], [place] [address] Phone: [phone] Fax: [phone]  [facility] ([place], [place] [facility] [address] [place] [address] [place] [address] Phone: [phone] Fax: [phone]   Patient reports affordability concerns with their medications: No , had brief issue when transferring to husbands insurance. Does mention Elenor Legato is still $39 with with insurance.  Patient reports access/transportation concerns to their pharmacy: No  Patient reports adherence concerns with their medications:  Yes     Diabetes:  Current medications: lantus 40 units BID, trying to get mounjaro restarted but issues in stock with pharmacy. Recently switched from levemir due to product discontinuation, to lantus, and going well.   Current glucose readings: 167 fasting, 174 currently with breakfast 2 hours ago Using Center 3 meter; testing continuously   Date of Download: 03/2722 % Time CGM is active: 13% Average Glucose: 208 mg/dL Glucose Management Indicator: N/A  Glucose Variability: 24.1 (goal <36%) Time in Goal:  - Time in range 70-180: 31% - Time above range: 69% - Time below range: 0% Observed patterns: data still building, patient just recently began using the sensor   Patient denies hypoglycemic s/sx including dizziness, shakiness, sweating. Patient reports occasional hyperglycemic symptoms including polyuria, polydipsia, polyphagia,  nocturia, neuropathy, blurred vision.  Current meal patterns:  - drinks lots of hot tea   Hypertension:  Current medications: metoprolol XL 25mg  daily, mostly for tachycardia. Patient describes white coat hypertension   Patient does not have a validated, automated, upper arm home BP cuff Current blood pressure readings readings: not currently checking  Patient denies hypotensive s/sx including dizziness, lightheadedness.  Patient denies hypertensive symptoms including headache, chest pain, shortness of breath   Hyperlipidemia/ASCVD Risk Reduction  Current lipid lowering medications: none at present, LDL 132 in 2018    Objective:  Lab Results  Component Value Date   HGBA1C 9.9 (H) 04/01/2022    Lab Results  Component Value Date   CREATININE 0.67 04/01/2022   BUN 19 04/01/2022   NA 132 (L) 04/01/2022   K 4.6 04/01/2022   CL 101 04/01/2022   CO2 22 04/01/2022    Lab Results  Component Value Date   CHOL 248 (A) 07/07/2016   HDL 35 07/07/2016   LDLCALC 132 07/07/2016   TRIG 654 (A) 07/07/2016   CHOLHDL 6.7 (H) 12/23/2014    Medications Reviewed Today     Reviewed by Willeen Cass, RN (Registered Nurse) on 04/01/22 at 74  Med List Status: <None>   Medication Order Taking? Sig Documenting Provider Last Dose Status Informant  acetaminophen (TYLENOL) 500 MG tablet [id number]  Take 1,000 mg by mouth every 6 (six) hours as needed for moderate pain. [provider]  Active Self  busPIRone (BUSPAR) 10 MG tablet [id number]  Take 1 tablet (10 mg total) by mouth 3 (three) times daily. Replaces alprazolam XR. Luetta Nutting, DO  Active Self  Continuous Blood Gluc Sensor (FREESTYLE LIBRE 3 SENSOR) [place] [id number]  1 Device by  Does not apply route every 14 (fourteen) days. Place 1 sensor on the skin every 14 days. Use to check glucose continuously Luetta Nutting, DO  Active Self  cyclobenzaprine (FLEXERIL) 10 MG tablet [id number]  TAKE 1 TABLET BY MOUTH THREE TIMES A  DAY AS NEEDED FOR MUSCLE SPASM Luetta Nutting, DO  Active Self  dapagliflozin propanediol (FARXIGA) 10 MG TABS tablet [id number]  Take 1 tablet (10 mg total) by mouth daily. Luetta Nutting, DO  Active Self  Elagolix Sodium (ORILISSA) 150 MG TABS [id number]  Take 1 tablet (150 mg total) by mouth daily. Joylene John D, NP  Active Self           Med Note Wilmon Pali, MELISSA R   Thu Mar 31, 2022  4:03 PM) ON HOLD due to Insurance   FLUoxetine (PROZAC) 40 MG capsule [id number]  Take 1 capsule (40 mg total) by mouth daily. Luetta Nutting, DO  Active Self  gabapentin (NEURONTIN) 600 MG tablet [id number]  Take 2 tablets (1,200 mg total) by mouth 3 (three) times daily. Luetta Nutting, DO  Active Self  insulin glargine (LANTUS SOLOSTAR) 100 UNIT/ML Solostar Pen [id number]  Inject 30 Units into the skin 2 (two) times daily. May titrate to 35 units qam and qpm if needed. Luetta Nutting, DO  Active Self  Insulin Pen Needle (ADVOCATE INSULIN PEN NEEDLES) 33G X 4 MM MISC [id number]  1 each by Does not apply route daily. Luetta Nutting, DO  Active Self  ketoconazole (NIZORAL) 2 % shampoo [id number]  APPLY 1 APPLICATION. TOPICALLY 2 (TWO) TIMES A WEEK. Luetta Nutting, DO  Active Self  metoprolol succinate (TOPROL-XL) 25 MG 24 hr tablet [id number]  Take 1 tablet (25 mg total) by mouth daily. Luetta Nutting, DO  Active Self  nystatin cream (MYCOSTATIN) [id number]  Apply 1 Application topically 2 (two) times daily.  Patient taking differently: Apply 1 Application topically 2 (two) times daily as needed for dry skin.   Luetta Nutting, DO  Active Self  ondansetron (ZOFRAN ODT) 4 MG disintegrating tablet [id number]  Take 1 tablet (4 mg total) by mouth every 8 (eight) hours as needed for nausea or vomiting. Radene Gunning, MD  Active Self  QUEtiapine (SEROQUEL) 50 MG tablet [id number]  Take 1 tablet (50 mg total) by mouth at bedtime. Luetta Nutting, DO  Active Self  tirzepatide [employer]) 10 MG/0.5ML Pen [id number]  Inject 10 mg into  the skin once a week. Luetta Nutting, DO  Active Self           Med Note Wilmon Pali, MELISSA R   Thu Mar 31, 2022  4:05 PM) On Back Order   tirzepatide [facility]) 5 MG/0.5ML Pen [id number]  Inject 5 mg into the skin once a week. Increase to 7.5mg  after 4 weeks  Patient not taking: Reported on 03/31/2022   Luetta Nutting, DO  Active Self  tirzepatide [facility]) 7.5 MG/0.5ML Pen [id number]  INJECT 7.5 MG INTO THE SKIN ONCE A WEEK. INCREASE TO 10MG  AFTER 4 WEEKS  Patient not taking: Reported on 03/31/2022   Luetta Nutting, DO  Active Self              Assessment/Plan:   Diabetes: - Currently uncontrolled but patient was out of medications briefly, plans are back in place for glucose control - Reviewed long term cardiovascular and renal outcomes of uncontrolled blood sugar - Reviewed goal A1c, goal fasting, and goal 2 hour post prandial glucose - Reviewed dietary modifications including prioritizing protein, limiting portion sizes of carbs, opting for  vegetables, and lastly fruit in moderation if noticing sugars elevate with fruit intake - Recommend to continue current regimen, advised patient to titrate insulin units by 4 units every 2-3 days if noticing BG > 180 as she will be temporarily stopping medications to prep for surgery - Recommend to check glucose using CGM   Hypertension: - Currently unknown control - Reviewed long term cardiovascular and renal outcomes of uncontrolled blood pressure - Counseled patient to purchase "omron" brand upper arm BP cuff for home monitoring with goal of 130/80 or lower - Recommend to continue current regimen      Hyperlipidemia/ASCVD Risk Reduction: - Currently unknown control, due for lipid panel repeat (last labs 2018) - Reviewed long term complications of uncontrolled cholesterol - Recommend to repeat lipid panel (lab order is available from 12/2021) and consider statin in future  Follow Up Plan: 1-2 months  Larinda Buttery, PharmD Clinical  Pharmacist [facility] [phone]

## 2022-04-07 NOTE — Transitions of Care (Post Inpatient/ED Visit) (Signed)
   04/07/2022  Name: Christine Grant MRN: [id number] DOB: July 11, 1975  Today's TOC FU Call Status: Unsuccessful Call (1st Attempt) Date: 04/04/22 Unsuccessful Call (2nd Attempt) Date: 04/07/22  Attempted to reach the patient regarding the most recent Inpatient/ED visit.  Follow Up Plan: Additional outreach attempts will be made to reach the patient to complete the Transitions of Care (Post Inpatient/ED visit) call.   Signature Tinnie Gens, RN BSN

## 2022-04-07 NOTE — H&P (View-Only) (Signed)
Gynecologic Oncology Return Clinic Visit  04/08/22  Reason for Visit: treatment planning  Treatment History: Patient was initially seen in our clinic in 2015-2016 in the setting of chronic pelvic pain, narcotic dependency, and desire for surgical intervention.  She was found to have a thickened endometrial lining and given risk factors for endometrial hyperplasia and malignancy, endometrial sampling was recommended.  She underwent D&C on 02/13/2014 with final pathology revealing benign secretory endometrium.  Her pain was very difficult to manage postoperatively as described in Dr. Serita Grit note from 02/28/2014.   Her subsequent treatment history is as noted below from most recent 2 oldest.  Shortly after her visit last in our clinic, she underwent robotic hysterectomy. Other surgeries since have included left salpingo-oophorectomy for enlarged ovary and concern for torsion, as well as appendectomy.  At the time of both surgeries, extensive pelvic adhesions were noted in both surgeries required conversion from laparoscopy to open.   2016: Robotic hysterectomy for abnormal uterine bleeding and chronic pelvic pain.  Per patient report, extensive adhesions were noted at that time as well as likely endometriosis.  06/2017: Patient underwent exploratory laparoscopy converted to exploratory laparotomy, lysis of adhesion, cystoscopy, left salpingo-oophorectomy, and sigmoidoscopy in the setting of abdominal pain and concern for ovarian torsion.  Procedure was complicated by dense adhesive disease requiring conversion to open procedure with an estimated blood loss of 300 cc.  At the time of presentation, patient was noted to have an enlarged 6.6 cm left ovary with both imaging and exam findings concerning for torsion.  Findings at the time of surgery included omental and small bowel adhesions to the anterior abdominal wall.  Sigmoid colon was draped over the left ovary.  Right ovary noted to be normal in appearance  and adherent to the sidewall.  Pathology from surgery showed hemorrhagic cyst and intraparenchymal ovarian hemorrhage consistent with ischemic changes.  Benign ovary. 02/04/2018: Pelvic ultrasound shows right ovary measures 7.8 x 6.4 x 5.9 cm with a 6.4 x 5.8 x 4.7 cm cyst containing multiple thin internal septations and membranes with low-level internal echoes.  Cyst thought to most likely represent a hemorrhagic cyst or endometrioma. 06/15/2019: CT of the abdomen and pelvis shows borderline dilation of the tip of the appendix measuring 8 mm with very minimal inflammation.  Ill-defined 1.5 cm hypoattenuating lesion in the posterior segment 4 of the liver.  Chronic scarring along the anterior bladder and in the soft tissues of the low anterior pelvic wall. 06/16/2019: Pelvic ultrasound exam reveals a right ovary measuring 3.1 x 2.3 x 2.6 cm with a small anechoic follicle measuring 1.2 cm.  Previously seen hemorrhagic cyst noted to have involuted. 06/2019: Patient presented with several months of right lower quadrant pain with episodes of nausea.  Given concern for early appendicitis, patient was taken to the operating room for an appendectomy.  This was a laparoscopic procedure converted to open secondary to significant adhesive disease.  Findings at the time of surgery included a enlarged body and tip of the appendix.  Postoperative course was complicated by delayed healing of incision. 03/06/2020: Pelvic ultrasound reveals right ovary measures 3.3 x 3.2 x 2.6 cm with a 1.8 cm dominant follicle. 06/28/2020: Pelvic ultrasound exam at [place] shows right ovary measuring 3.9 x 2.7 x 2.8 cm with a 1.4 cm simple appearing cyst. 10/30/2020: Pelvic MRI at [facility] shows right ovary measuring 3.5 x 1.8 x 2 cm.  Multiple follicles present.  Within the inferior aspect of the ovary, there is  an involuting ovarian cyst with typical peripheral enhancement measuring 2 cm.  Within the superior aspect, there is a T1/T2  hypointense lesion measuring 1.8 x 2 cm, demonstrating diffuse enhancement postcontrast.  Nodule appears to be within the ovary but closely abuts the cecum.  In retrospect review of prior CT scans, this nodule appears to have been present since 2020 and slightly hyperdense as compared to surrounding ovarian tissue. 12/17/2020: CT of the abdomen and pelvis shows enhancing area within the right ovary, possibly due to endometrioma, hemorrhagic cyst, or mass.  This is measured to be at least 2.3 x 1.5 cm. 12/17/2020: Pelvic ultrasound exam at [place].  Right ovary measures 3.5 x 3.2 x 3.2 cm.  Mildly hypoechoic mass within the right ovary measures 1.8 x 1.6 x 1.7 cm and has associated Doppler flow.  Not significantly changed in size compared to October 2022 MRI. 02/17/2021: Pelvic ultrasound exam at [place] shows right ovary measuring 3.5 x 2.7 x 3.1 cm.  Small lesion measuring 1.7 x 1.6 x 1.3 cm which does appear to have small amount of internal flow or possible artifact.  Impression is that there is a more solid-appearing lesion on the right ovary compared to 2 cyst like structures on prior examinations.  The cyst could represent hemorrhagic complex cyst or proteinaceous ovarian cyst.  Low degree suspicion for neoplasm considering waxing and waning appearance of the cyst on multiple prior exams.  CEA on 10/28/2020: 3.1   Patient was seen most recently by Dr. Damita Dunnings on 1/27 in the setting of continued right lower quadrant pain. Most recent pelvic ultrasound exam in our system on 2/25 shows right ovary measures 3.3 x 3.2 x 2.6 cm with a 1.8 cm dominant follicle in the ovary.  No suspicious masses.  See was started on [place] in early 2023.   Pelvic ultrasound in 05/2021 revealed right ovary measures 3.4 x 3.1 x 3 cm.  Previous ovarian cyst no longer seen.  Within the right ovary is a suggestion of a 2.7 cm echogenic solid lesion with vascularity.  CT of the abdomen and pelvis on 06/24/2021 reveals no  acute abnormality seen.    MRI of the pelvis on 06/30/2021 reveals a 2.5 cm solid homogeneously enhancing right ovarian mass.  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  MRI of the pelvis on 12/28/2021 shows a solid right enhancing ovarian lesion within 1 mm of measurements from June 2023 but enlarged since October 2022.  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  No ascites or peritoneal nodularity, no adenopathy.  Postoperative changes along the lower abdominal wall with thinning or absence of the right rectus muscle and laxity of the abdominal wall is similar to prior imaging.  Pelvic ultrasound at [facility] on 03/01/2022 reveals an isoechoic area within the right ovary measuring up to 2.4 cm, thought to perhaps represent a hemorrhagic cyst.  CT of the abdomen and pelvis at [place] on 03/21/2022 shows 3 cm solid enhancing lesion of the right adnexa, unchanged from prior study.  Pelvic ultrasound on 04/02/2022 at [place] reveals the right ovary measures 4 x 2.8 x 3.7 cm.  No mass or fluid collection noted.  Interval History: Patient continues to have right-sided abdominal pain.  Has not been on pain or anxiety medication for the last 2 and half months.  Has follow-up with her pain medicine clinic 1 week after surgery.  Has been restarted  on insulin as well as [place].  Has been wearing her continuous glucose monitor with all glucose levels less than 180.  Past Medical/Surgical History: Past Medical History:  Diagnosis Date   Abnormal uterine bleeding    Anxiety    Arthritis    knees   Chronic pelvic pain in female    Degenerative joint disease    back   Depression    Endometriosis of pelvis    Fibromyalgia    History of ketoacidosis    01-15-2014   PONV (postoperative nausea and vomiting)    Sinus tachycardia    on metoprolol   Type 2 diabetes mellitus ([place])    Wears contact lenses      Past Surgical History:  Procedure Laterality Date   ABDOMINAL HYSTERECTOMY     APPENDECTOMY     BREAST REDUCTION SURGERY  age 43   CESAREAN SECTION  2002,  2005,  2008   DILATION AND CURETTAGE OF UTERUS N/A 02/13/2014   Procedure: DILATATION AND CURETTAGE;  Surgeon: Everitt Amber, MD;  Location: [facility];  Service: Gynecology;  Laterality: N/A;   LEFT OOPHORECTOMY  2019   MANDIBLE SURGERY  age 43   Correct overbite   REDUCTION MAMMAPLASTY      Family History  Adopted: Yes  Problem Relation Age of Onset   Hyperlipidemia Mother    Hypertension Mother    Diabetes Mother    Breast cancer Maternal Aunt    Endometriosis Maternal Aunt    Colon cancer Neg Hx    Pancreatic cancer Neg Hx    Ovarian cancer Neg Hx    Prostate cancer Neg Hx     Social History   Socioeconomic History   Marital status: Married    Spouse name: Not on file   Number of children: Not on file   Years of education: Not on file   Highest education level: Not on file  Occupational History   Occupation: works in medical billing  Tobacco Use   Smoking status: Never   Smokeless tobacco: Never  Vaping Use   Vaping Use: Never used  Substance and Sexual Activity   Alcohol use: No   Drug use: No   Sexual activity: Yes    Birth control/protection: Surgical  Other Topics Concern   Not on file  Social History Narrative   Not on file   Social Determinants of Health   Financial Resource Strain: Low Risk  (04/06/2022)   Overall Financial Resource Strain (CARDIA)    Difficulty of Paying Living Expenses: Not very hard  Food Insecurity: Food Insecurity Present (02/05/2021)   Hunger Vital Sign    Worried About Running Out of Food in the Last Year: Sometimes true    Ran Out of Food in the Last Year: Never true  Transportation Needs: No Transportation Needs (02/05/2021)   PRAPARE - Hydrologist (Medical): No    Lack of Transportation (Non-Medical): No  Physical  Activity: Not on file  Stress: Not on file  Social Connections: Not on file    Current Medications:  Current Outpatient Medications:    acetaminophen (TYLENOL) 500 MG tablet, Take 1,000 mg by mouth every 6 (six) hours as needed for moderate pain., Disp: , Rfl:    busPIRone (BUSPAR) 10 MG tablet, Take 1 tablet (10 mg total) by mouth 3 (three) times daily. Replaces alprazolam XR., Disp: 270 tablet, Rfl: 2   Continuous Blood Gluc Sensor (FREESTYLE LIBRE 3 SENSOR) MISC, 1 Device  by Does not apply route every 14 (fourteen) days. Place 1 sensor on the skin every 14 days. Use to check glucose continuously, Disp: 2 each, Rfl: 6   cyclobenzaprine (FLEXERIL) 10 MG tablet, TAKE 1 TABLET BY MOUTH THREE TIMES A DAY AS NEEDED FOR MUSCLE SPASM, Disp: 270 tablet, Rfl: 1   dapagliflozin propanediol (FARXIGA) 10 MG TABS tablet, Take 1 tablet (10 mg total) by mouth daily., Disp: 90 tablet, Rfl: 3   Elagolix Sodium (ORILISSA) 150 MG TABS, Take 1 tablet (150 mg total) by mouth daily. (Patient not taking: Reported on 04/06/2022), Disp: 30 tablet, Rfl: 3   FLUoxetine (PROZAC) 40 MG capsule, Take 1 capsule (40 mg total) by mouth daily., Disp: 90 capsule, Rfl: 1   gabapentin (NEURONTIN) 600 MG tablet, Take 2 tablets (1,200 mg total) by mouth 3 (three) times daily., Disp: 540 tablet, Rfl: 0   insulin glargine (LANTUS SOLOSTAR) 100 UNIT/ML Solostar Pen, Inject 30 Units into the skin 2 (two) times daily. May titrate to 35 units qam and qpm if needed., Disp: 60 mL, Rfl: 1   Insulin Pen Needle (ADVOCATE INSULIN PEN NEEDLES) 33G X 4 MM MISC, 1 each by Does not apply route daily., Disp: 100 each, Rfl: 12   ketoconazole (NIZORAL) 2 % shampoo, APPLY 1 APPLICATION. TOPICALLY 2 (TWO) TIMES A WEEK., Disp: 120 mL, Rfl: 0   metoprolol succinate (TOPROL-XL) 25 MG 24 hr tablet, Take 1 tablet (25 mg total) by mouth daily., Disp: 90 tablet, Rfl: 1   nystatin cream (MYCOSTATIN), Apply 1 Application topically 2 (two) times daily. (Patient  taking differently: Apply 1 Application topically 2 (two) times daily as needed for dry skin.), Disp: 30 g, Rfl: 3   ondansetron (ZOFRAN ODT) 4 MG disintegrating tablet, Take 1 tablet (4 mg total) by mouth every 8 (eight) hours as needed for nausea or vomiting., Disp: 20 tablet, Rfl: 0   QUEtiapine (SEROQUEL) 50 MG tablet, Take 1 tablet (50 mg total) by mouth at bedtime., Disp: 90 tablet, Rfl: 2   tirzepatide (MOUNJARO) 10 MG/0.5ML Pen, Inject 10 mg into the skin once a week., Disp: 6 mL, Rfl: 1   tirzepatide (MOUNJARO) 5 MG/0.5ML Pen, Inject 5 mg into the skin once a week. Increase to 7.5mg  after 4 weeks, Disp: 2 mL, Rfl: 0   tirzepatide (MOUNJARO) 7.5 MG/0.5ML Pen, INJECT 7.5 MG INTO THE SKIN ONCE A WEEK. INCREASE TO 10MG  AFTER 4 WEEKS, Disp: 2 mL, Rfl: 0  Review of Systems: + Pelvic and back pain Denies appetite changes, fevers, chills, fatigue, unexplained weight changes. Denies hearing loss, neck lumps or masses, mouth sores, ringing in ears or voice changes. Denies cough or wheezing.  Denies shortness of breath. Denies chest pain or palpitations. Denies leg swelling. Denies abdominal distention, pain, blood in stools, constipation, diarrhea, nausea, vomiting, or early satiety. Denies pain with intercourse, dysuria, frequency, hematuria or incontinence. Denies hot flashes, vaginal bleeding or vaginal discharge.   Denies joint pain or muscle pain/cramps. Denies itching, rash, or wounds. Denies dizziness, headaches, numbness or seizures. Denies swollen lymph nodes or glands, denies easy bruising or bleeding. Denies anxiety, depression, confusion, or decreased concentration.  Physical Exam: BP 138/78 (BP Location: Left Arm, Patient Position: Sitting)   Pulse (!) 114   Temp 98.4 F (36.9 C) (Oral)   Resp 18   Ht 5\' 6"  (1.676 m)   Wt 254 lb 9.6 oz (115.5 kg)   LMP 01/14/2014   SpO2 100%   BMI 41.09 kg/m  General: Alert,  oriented, no acute distress. HEENT: Atraumatic, normocephalic,  sclera anicteric. Chest: Unlabored breathing on room air.  Laboratory & Radiologic Studies: None new  Assessment & Plan: Christine Grant is a 47 y.o. woman with chronic abdominal and pelvic pain with multiple prior surgeries and known intra-abdominal adhesions.  Has been followed for a small, solid-appearing right adnexal mass that has grown very slowly over time, currently approximately 3 cm.  Patient strongly desires surgical management.  She and I have discussed this multiple times by phone given her complex surgical history and known intra-abdominal adhesions.  She also voices understanding that her pelvic pain may not change or could potentially become worse with additional surgery.  I stressed again that given known adhesive disease involving small bowel, there is significant risk related to bowel injury at the time of surgery.  Her hospital length of stay will depend on whether the procedure can be performed in a minimally invasive manner or open and if she requires any bowel surgery.  My concern from a cancer standpoint is quite low.  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  Will have the patient stop by lab today after her appointment.  Given the very small change in size of this right solid-appearing adnexal mass over many imaging studies, even if this represented a cancer, it is an indolent one.  Plan will be to send the ovary for frozen section at the time of surgery.  If borderline tumor or cancer identified, additional staging procedures may be performed including peritoneal biopsies, omentectomy, and lymph node sampling.  Patient is followed by a pain clinic.  She has an appointment a week after the surgery.  We discussed that I will manage her pain postoperatively in the short-term but she will need long-term pain follow-up.  Will pursue a tap block for surgery, ideally with Exparel.  We reviewed the plan  for diagnostic laparoscopy, possible robotic assisted right pelvic mass excision/unilateral salpingo-oophorectomy, possible staging procedures, possible laparotomy. The risks of surgery were discussed in detail and she understands these to include infection; wound separation; hernia; injury to adjacent organs such as bowel, bladder, blood vessels, ureters and nerves; bleeding which may require blood transfusion; anesthesia risk; thromboembolic events; possible Rosch; unforeseen complications; possible need for re-exploration; medical complications such as heart attack, stroke, pleural effusion and pneumonia; and, if full lymphadenectomy is performed the risk of lymphedema and lymphocyst. The patient will receive DVT and antibiotic prophylaxis as indicated. She voiced a clear understanding. She had the opportunity to ask questions. Perioperative instructions were reviewed with her. Prescriptions for post-op medications were sent to her pharmacy of choice.  32 minutes of total time was spent for this patient encounter, including preparation, face-to-face counseling with the patient and coordination of care, and documentation of the encounter.  Jeral Pinch, MD  Division of Gynecologic Oncology  Department of Obstetrics and Gynecology  [facility]

## 2022-04-07 NOTE — Progress Notes (Signed)
Gynecologic Oncology Return Clinic Visit  04/08/22  Reason for Visit: treatment planning  Treatment History: Patient was initially seen in our clinic in 2015-2016 in the setting of chronic pelvic pain, narcotic dependency, and desire for surgical intervention.  She was found to have a thickened endometrial lining and given risk factors for endometrial hyperplasia and malignancy, endometrial sampling was recommended.  She underwent D&C on 02/13/2014 with final pathology revealing benign secretory endometrium.  Her pain was very difficult to manage postoperatively as described in Dr. Serita Grit note from 02/28/2014.   Her subsequent treatment history is as noted below from most recent 2 oldest.  Shortly after her visit last in our clinic, she underwent robotic hysterectomy. Other surgeries since have included left salpingo-oophorectomy for enlarged ovary and concern for torsion, as well as appendectomy.  At the time of both surgeries, extensive pelvic adhesions were noted in both surgeries required conversion from laparoscopy to open.   2016: Robotic hysterectomy for abnormal uterine bleeding and chronic pelvic pain.  Per patient report, extensive adhesions were noted at that time as well as likely endometriosis.  06/2017: Patient underwent exploratory laparoscopy converted to exploratory laparotomy, lysis of adhesion, cystoscopy, left salpingo-oophorectomy, and sigmoidoscopy in the setting of abdominal pain and concern for ovarian torsion.  Procedure was complicated by dense adhesive disease requiring conversion to open procedure with an estimated blood loss of 300 cc.  At the time of presentation, patient was noted to have an enlarged 6.6 cm left ovary with both imaging and exam findings concerning for torsion.  Findings at the time of surgery included omental and small bowel adhesions to the anterior abdominal wall.  Sigmoid colon was draped over the left ovary.  Right ovary noted to be normal in appearance  and adherent to the sidewall.  Pathology from surgery showed hemorrhagic cyst and intraparenchymal ovarian hemorrhage consistent with ischemic changes.  Benign ovary. 02/04/2018: Pelvic ultrasound shows right ovary measures 7.8 x 6.4 x 5.9 cm with a 6.4 x 5.8 x 4.7 cm cyst containing multiple thin internal septations and membranes with low-level internal echoes.  Cyst thought to most likely represent a hemorrhagic cyst or endometrioma. 06/15/2019: CT of the abdomen and pelvis shows borderline dilation of the tip of the appendix measuring 8 mm with very minimal inflammation.  Ill-defined 1.5 cm hypoattenuating lesion in the posterior segment 4 of the liver.  Chronic scarring along the anterior bladder and in the soft tissues of the low anterior pelvic wall. 06/16/2019: Pelvic ultrasound exam reveals a right ovary measuring 3.1 x 2.3 x 2.6 cm with a small anechoic follicle measuring 1.2 cm.  Previously seen hemorrhagic cyst noted to have involuted. 06/2019: Patient presented with several months of right lower quadrant pain with episodes of nausea.  Given concern for early appendicitis, patient was taken to the operating room for an appendectomy.  This was a laparoscopic procedure converted to open secondary to significant adhesive disease.  Findings at the time of surgery included a enlarged body and tip of the appendix.  Postoperative course was complicated by delayed healing of incision. 03/06/2020: Pelvic ultrasound reveals right ovary measures 3.3 x 3.2 x 2.6 cm with a 1.8 cm dominant follicle. 06/28/2020: Pelvic ultrasound exam at [place] shows right ovary measuring 3.9 x 2.7 x 2.8 cm with a 1.4 cm simple appearing cyst. 10/30/2020: Pelvic MRI at [facility] shows right ovary measuring 3.5 x 1.8 x 2 cm.  Multiple follicles present.  Within the inferior aspect of the ovary, there is  an involuting ovarian cyst with typical peripheral enhancement measuring 2 cm.  Within the superior aspect, there is a T1/T2  hypointense lesion measuring 1.8 x 2 cm, demonstrating diffuse enhancement postcontrast.  Nodule appears to be within the ovary but closely abuts the cecum.  In retrospect review of prior CT scans, this nodule appears to have been present since 2020 and slightly hyperdense as compared to surrounding ovarian tissue. 12/17/2020: CT of the abdomen and pelvis shows enhancing area within the right ovary, possibly due to endometrioma, hemorrhagic cyst, or mass.  This is measured to be at least 2.3 x 1.5 cm. 12/17/2020: Pelvic ultrasound exam at [place].  Right ovary measures 3.5 x 3.2 x 3.2 cm.  Mildly hypoechoic mass within the right ovary measures 1.8 x 1.6 x 1.7 cm and has associated Doppler flow.  Not significantly changed in size compared to October 2022 MRI. 02/17/2021: Pelvic ultrasound exam at [place] shows right ovary measuring 3.5 x 2.7 x 3.1 cm.  Small lesion measuring 1.7 x 1.6 x 1.3 cm which does appear to have small amount of internal flow or possible artifact.  Impression is that there is a more solid-appearing lesion on the right ovary compared to 2 cyst like structures on prior examinations.  The cyst could represent hemorrhagic complex cyst or proteinaceous ovarian cyst.  Low degree suspicion for neoplasm considering waxing and waning appearance of the cyst on multiple prior exams.  CEA on 10/28/2020: 3.1   Patient was seen most recently by Dr. Damita Dunnings on 1/27 in the setting of continued right lower quadrant pain. Most recent pelvic ultrasound exam in our system on 2/25 shows right ovary measures 3.3 x 3.2 x 2.6 cm with a 1.8 cm dominant follicle in the ovary.  No suspicious masses.  See was started on [place] in early 2023.   Pelvic ultrasound in 05/2021 revealed right ovary measures 3.4 x 3.1 x 3 cm.  Previous ovarian cyst no longer seen.  Within the right ovary is a suggestion of a 2.7 cm echogenic solid lesion with vascularity.  CT of the abdomen and pelvis on 06/24/2021 reveals no  acute abnormality seen.    MRI of the pelvis on 06/30/2021 reveals a 2.5 cm solid homogeneously enhancing right ovarian mass.  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  MRI of the pelvis on 12/28/2021 shows a solid right enhancing ovarian lesion within 1 mm of measurements from June 2023 but enlarged since October 2022.  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  No ascites or peritoneal nodularity, no adenopathy.  Postoperative changes along the lower abdominal wall with thinning or absence of the right rectus muscle and laxity of the abdominal wall is similar to prior imaging.  Pelvic ultrasound at [facility] on 03/01/2022 reveals an isoechoic area within the right ovary measuring up to 2.4 cm, thought to perhaps represent a hemorrhagic cyst.  CT of the abdomen and pelvis at [place] on 03/21/2022 shows 3 cm solid enhancing lesion of the right adnexa, unchanged from prior study.  Pelvic ultrasound on 04/02/2022 at [place] reveals the right ovary measures 4 x 2.8 x 3.7 cm.  No mass or fluid collection noted.  Interval History: Patient continues to have right-sided abdominal pain.  Has not been on pain or anxiety medication for the last 2 and half months.  Has follow-up with her pain medicine clinic 1 week after surgery.  Has been restarted  on insulin as well as [place].  Has been wearing her continuous glucose monitor with all glucose levels less than 180.  Past Medical/Surgical History: Past Medical History:  Diagnosis Date   Abnormal uterine bleeding    Anxiety    Arthritis    knees   Chronic pelvic pain in female    Degenerative joint disease    back   Depression    Endometriosis of pelvis    Fibromyalgia    History of ketoacidosis    01-15-2014   PONV (postoperative nausea and vomiting)    Sinus tachycardia    on metoprolol   Type 2 diabetes mellitus ([place])    Wears contact lenses      Past Surgical History:  Procedure Laterality Date   ABDOMINAL HYSTERECTOMY     APPENDECTOMY     BREAST REDUCTION SURGERY  age 33   CESAREAN SECTION  2002,  2005,  2008   DILATION AND CURETTAGE OF UTERUS N/A 02/13/2014   Procedure: DILATATION AND CURETTAGE;  Surgeon: Everitt Amber, MD;  Location: [facility];  Service: Gynecology;  Laterality: N/A;   LEFT OOPHORECTOMY  2019   MANDIBLE SURGERY  age 16   Correct overbite   REDUCTION MAMMAPLASTY      Family History  Adopted: Yes  Problem Relation Age of Onset   Hyperlipidemia Mother    Hypertension Mother    Diabetes Mother    Breast cancer Maternal Aunt    Endometriosis Maternal Aunt    Colon cancer Neg Hx    Pancreatic cancer Neg Hx    Ovarian cancer Neg Hx    Prostate cancer Neg Hx     Social History   Socioeconomic History   Marital status: Married    Spouse name: Not on file   Number of children: Not on file   Years of education: Not on file   Highest education level: Not on file  Occupational History   Occupation: works in medical billing  Tobacco Use   Smoking status: Never   Smokeless tobacco: Never  Vaping Use   Vaping Use: Never used  Substance and Sexual Activity   Alcohol use: No   Drug use: No   Sexual activity: Yes    Birth control/protection: Surgical  Other Topics Concern   Not on file  Social History Narrative   Not on file   Social Determinants of Health   Financial Resource Strain: Low Risk  (04/06/2022)   Overall Financial Resource Strain (CARDIA)    Difficulty of Paying Living Expenses: Not very hard  Food Insecurity: Food Insecurity Present (02/05/2021)   Hunger Vital Sign    Worried About Running Out of Food in the Last Year: Sometimes true    Ran Out of Food in the Last Year: Never true  Transportation Needs: No Transportation Needs (02/05/2021)   PRAPARE - Hydrologist (Medical): No    Lack of Transportation (Non-Medical): No  Physical  Activity: Not on file  Stress: Not on file  Social Connections: Not on file    Current Medications:  Current Outpatient Medications:    acetaminophen (TYLENOL) 500 MG tablet, Take 1,000 mg by mouth every 6 (six) hours as needed for moderate pain., Disp: , Rfl:    busPIRone (BUSPAR) 10 MG tablet, Take 1 tablet (10 mg total) by mouth 3 (three) times daily. Replaces alprazolam XR., Disp: 270 tablet, Rfl: 2   Continuous Blood Gluc Sensor (FREESTYLE LIBRE 3 SENSOR) MISC, 1 Device  by Does not apply route every 14 (fourteen) days. Place 1 sensor on the skin every 14 days. Use to check glucose continuously, Disp: 2 each, Rfl: 6   cyclobenzaprine (FLEXERIL) 10 MG tablet, TAKE 1 TABLET BY MOUTH THREE TIMES A DAY AS NEEDED FOR MUSCLE SPASM, Disp: 270 tablet, Rfl: 1   dapagliflozin propanediol (FARXIGA) 10 MG TABS tablet, Take 1 tablet (10 mg total) by mouth daily., Disp: 90 tablet, Rfl: 3   Elagolix Sodium (ORILISSA) 150 MG TABS, Take 1 tablet (150 mg total) by mouth daily. (Patient not taking: Reported on 04/06/2022), Disp: 30 tablet, Rfl: 3   FLUoxetine (PROZAC) 40 MG capsule, Take 1 capsule (40 mg total) by mouth daily., Disp: 90 capsule, Rfl: 1   gabapentin (NEURONTIN) 600 MG tablet, Take 2 tablets (1,200 mg total) by mouth 3 (three) times daily., Disp: 540 tablet, Rfl: 0   insulin glargine (LANTUS SOLOSTAR) 100 UNIT/ML Solostar Pen, Inject 30 Units into the skin 2 (two) times daily. May titrate to 35 units qam and qpm if needed., Disp: 60 mL, Rfl: 1   Insulin Pen Needle (ADVOCATE INSULIN PEN NEEDLES) 33G X 4 MM MISC, 1 each by Does not apply route daily., Disp: 100 each, Rfl: 12   ketoconazole (NIZORAL) 2 % shampoo, APPLY 1 APPLICATION. TOPICALLY 2 (TWO) TIMES A WEEK., Disp: 120 mL, Rfl: 0   metoprolol succinate (TOPROL-XL) 25 MG 24 hr tablet, Take 1 tablet (25 mg total) by mouth daily., Disp: 90 tablet, Rfl: 1   nystatin cream (MYCOSTATIN), Apply 1 Application topically 2 (two) times daily. (Patient  taking differently: Apply 1 Application topically 2 (two) times daily as needed for dry skin.), Disp: 30 g, Rfl: 3   ondansetron (ZOFRAN ODT) 4 MG disintegrating tablet, Take 1 tablet (4 mg total) by mouth every 8 (eight) hours as needed for nausea or vomiting., Disp: 20 tablet, Rfl: 0   QUEtiapine (SEROQUEL) 50 MG tablet, Take 1 tablet (50 mg total) by mouth at bedtime., Disp: 90 tablet, Rfl: 2   tirzepatide (MOUNJARO) 10 MG/0.5ML Pen, Inject 10 mg into the skin once a week., Disp: 6 mL, Rfl: 1   tirzepatide (MOUNJARO) 5 MG/0.5ML Pen, Inject 5 mg into the skin once a week. Increase to 7.5mg  after 4 weeks, Disp: 2 mL, Rfl: 0   tirzepatide (MOUNJARO) 7.5 MG/0.5ML Pen, INJECT 7.5 MG INTO THE SKIN ONCE A WEEK. INCREASE TO 10MG  AFTER 4 WEEKS, Disp: 2 mL, Rfl: 0  Review of Systems: + Pelvic and back pain Denies appetite changes, fevers, chills, fatigue, unexplained weight changes. Denies hearing loss, neck lumps or masses, mouth sores, ringing in ears or voice changes. Denies cough or wheezing.  Denies shortness of breath. Denies chest pain or palpitations. Denies leg swelling. Denies abdominal distention, pain, blood in stools, constipation, diarrhea, nausea, vomiting, or early satiety. Denies pain with intercourse, dysuria, frequency, hematuria or incontinence. Denies hot flashes, vaginal bleeding or vaginal discharge.   Denies joint pain or muscle pain/cramps. Denies itching, rash, or wounds. Denies dizziness, headaches, numbness or seizures. Denies swollen lymph nodes or glands, denies easy bruising or bleeding. Denies anxiety, depression, confusion, or decreased concentration.  Physical Exam: BP 138/78 (BP Location: Left Arm, Patient Position: Sitting)   Pulse (!) 114   Temp 98.4 F (36.9 C) (Oral)   Resp 18   Ht 5\' 6"  (1.676 m)   Wt 254 lb 9.6 oz (115.5 kg)   LMP 01/14/2014   SpO2 100%   BMI 41.09 kg/m  General: Alert,  oriented, no acute distress. HEENT: Atraumatic, normocephalic,  sclera anicteric. Chest: Unlabored breathing on room air.  Laboratory & Radiologic Studies: None new  Assessment & Plan: Christine Grant is a 47 y.o. woman with chronic abdominal and pelvic pain with multiple prior surgeries and known intra-abdominal adhesions.  Has been followed for a small, solid-appearing right adnexal mass that has grown very slowly over time, currently approximately 3 cm.  Patient strongly desires surgical management.  She and I have discussed this multiple times by phone given her complex surgical history and known intra-abdominal adhesions.  She also voices understanding that her pelvic pain may not change or could potentially become worse with additional surgery.  I stressed again that given known adhesive disease involving small bowel, there is significant risk related to bowel injury at the time of surgery.  Her hospital length of stay will depend on whether the procedure can be performed in a minimally invasive manner or open and if she requires any bowel surgery.  My concern from a cancer standpoint is quite low.  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  Will have the patient stop by lab today after her appointment.  Given the very small change in size of this right solid-appearing adnexal mass over many imaging studies, even if this represented a cancer, it is an indolent one.  Plan will be to send the ovary for frozen section at the time of surgery.  If borderline tumor or cancer identified, additional staging procedures may be performed including peritoneal biopsies, omentectomy, and lymph node sampling.  Patient is followed by a pain clinic.  She has an appointment a week after the surgery.  We discussed that I will manage her pain postoperatively in the short-term but she will need long-term pain follow-up.  Will pursue a tap block for surgery, ideally with Exparel.  We reviewed the plan  for diagnostic laparoscopy, possible robotic assisted right pelvic mass excision/unilateral salpingo-oophorectomy, possible staging procedures, possible laparotomy. The risks of surgery were discussed in detail and she understands these to include infection; wound separation; hernia; injury to adjacent organs such as bowel, bladder, blood vessels, ureters and nerves; bleeding which may require blood transfusion; anesthesia risk; thromboembolic events; possible [place]; unforeseen complications; possible need for re-exploration; medical complications such as heart attack, stroke, pleural effusion and pneumonia; and, if full lymphadenectomy is performed the risk of lymphedema and lymphocyst. The patient will receive DVT and antibiotic prophylaxis as indicated. She voiced a clear understanding. She had the opportunity to ask questions. Perioperative instructions were reviewed with her. Prescriptions for post-op medications were sent to her pharmacy of choice.  32 minutes of total time was spent for this patient encounter, including preparation, face-to-face counseling with the patient and coordination of care, and documentation of the encounter.  Jeral Pinch, MD  Division of Gynecologic Oncology  Department of Obstetrics and Gynecology  [facility]

## 2022-04-08 ENCOUNTER — Inpatient Hospital Stay: Payer: Managed Care, Other (non HMO)

## 2022-04-08 ENCOUNTER — Inpatient Hospital Stay: Payer: Managed Care, Other (non HMO) | Attending: Gynecologic Oncology | Admitting: Gynecologic Oncology

## 2022-04-08 ENCOUNTER — Inpatient Hospital Stay (HOSPITAL_BASED_OUTPATIENT_CLINIC_OR_DEPARTMENT_OTHER): Payer: Managed Care, Other (non HMO) | Admitting: Gynecologic Oncology

## 2022-04-08 ENCOUNTER — Inpatient Hospital Stay: Payer: Managed Care, Other (non HMO) | Admitting: Gynecologic Oncology

## 2022-04-08 VITALS — BP 138/78 | HR 114 | Temp 98.4°F | Resp 18 | Ht 66.0 in | Wt 254.6 lb

## 2022-04-08 DIAGNOSIS — G8929 Other chronic pain: Secondary | ICD-10-CM | POA: Diagnosis not present

## 2022-04-08 DIAGNOSIS — Z794 Long term (current) use of insulin: Secondary | ICD-10-CM | POA: Diagnosis not present

## 2022-04-08 DIAGNOSIS — E119 Type 2 diabetes mellitus without complications: Secondary | ICD-10-CM | POA: Diagnosis not present

## 2022-04-08 DIAGNOSIS — Z6841 Body Mass Index (BMI) 40.0 and over, adult: Secondary | ICD-10-CM | POA: Diagnosis not present

## 2022-04-08 DIAGNOSIS — E1165 Type 2 diabetes mellitus with hyperglycemia: Secondary | ICD-10-CM

## 2022-04-08 DIAGNOSIS — R102 Pelvic and perineal pain: Secondary | ICD-10-CM | POA: Insufficient documentation

## 2022-04-08 DIAGNOSIS — N83299 Other ovarian cyst, unspecified side: Secondary | ICD-10-CM

## 2022-04-08 DIAGNOSIS — D3911 Neoplasm of uncertain behavior of right ovary: Secondary | ICD-10-CM | POA: Diagnosis not present

## 2022-04-08 DIAGNOSIS — Z8742 Personal history of other diseases of the female genital tract: Secondary | ICD-10-CM

## 2022-04-08 DIAGNOSIS — R109 Unspecified abdominal pain: Secondary | ICD-10-CM | POA: Insufficient documentation

## 2022-04-08 DIAGNOSIS — N736 Female pelvic peritoneal adhesions (postinfective): Secondary | ICD-10-CM | POA: Insufficient documentation

## 2022-04-08 NOTE — Patient Instructions (Signed)
Preparing for your Surgery   Plan for surgery on April 13, 2022 with Dr. Jeral Pinch at [place] will be scheduled for diagnostic laparoscopy (looking into the abdomen with a camera), robotic assisted laparoscopic versus open right salpingo-oophorectomy, possible staging if a precancer or cancer is seen.    Pre-operative Testing -You will receive a phone call from presurgical testing at [facility] to arrange for a pre-operative appointment and lab work.   -Bring your insurance card, copy of an advanced directive if applicable, medication list   -At that visit, you will be asked to sign a consent for a possible blood transfusion in case a transfusion becomes necessary during surgery.  The need for a blood transfusion is rare but having consent is a necessary part of your care.      -You should not be taking blood thinners or aspirin at least ten days prior to surgery unless instructed by your surgeon.   -Do not take supplements such as fish oil (omega 3), red yeast rice, turmeric before your surgery. You want to avoid medications with aspirin in them including headache powders such as BC or Goody's), Excedrin migraine.   Day Before Surgery at [place] will be asked to take in a light diet the day before surgery. You will be advised you can have clear liquids up until 3 hours before your surgery.     Eat a light diet the day before surgery.  Examples including soups, broths, toast, yogurt, mashed potatoes.  AVOID GAS PRODUCING FOODS AND BEVERAGES. Things to avoid include carbonated beverages (fizzy beverages, sodas), raw fruits and raw vegetables (uncooked), or beans.    If your bowels are filled with gas, your surgeon will have difficulty visualizing your pelvic organs which increases your surgical risks.   Your role in recovery Your role is to become active as soon as directed by your doctor, while still giving yourself time to heal.  Rest when you feel tired.  You will be asked to do the following in order to speed your recovery:   - Cough and breathe deeply. This helps to clear and expand your lungs and can prevent pneumonia after surgery.  - [place]. Do mild physical activity. Walking or moving your legs help your circulation and body functions return to normal. Do not try to get up or walk alone the first time after surgery.   -If you develop swelling on one leg or the other, pain in the back of your leg, redness/warmth in one of your legs, please call the office or go to the Emergency Room to have a doppler to rule out a blood clot. For shortness of breath, chest pain-seek care in the Emergency Room as soon as possible. - Actively manage your pain. Managing your pain lets you move in comfort. We will ask you to rate your pain on a scale of zero to 10. It is your responsibility to tell your doctor or nurse where and how much you hurt so your pain can be treated.   Special Considerations -If you are diabetic, you may be placed on insulin after surgery to have closer control over your blood sugars to promote healing and recovery.  This does not mean that you will be discharged on insulin.  If applicable, your oral antidiabetics will be resumed when you are tolerating a solid diet.   -Your final pathology results from surgery should be available around one week after surgery and  the results will be relayed to you when available.   -FMLA forms can be faxed to [phone] and please allow 5-7 business days for completion.   -Make sure that you have Tylenol and Ibuprofen IF YOU ARE ABLE TO TAKE THESE MEDICATIONS at home to use on a regular basis after surgery for pain control. We recommend alternating the medications every hour to six hours since they work differently and are processed in the body differently for pain relief.   Bowel Regimen -You will be prescribed Sennakot-S to take nightly to prevent constipation especially if you  are taking the narcotic pain medication intermittently.  It is important to prevent constipation and drink adequate amounts of liquids. You can stop taking this medication when you are not taking pain medication and you are back on your normal bowel routine.   Risks of Surgery Risks of surgery are low but include bleeding, infection, damage to surrounding structures, re-operation, blood clots, and very rarely [place].     Blood Transfusion Information (For the consent to be signed before surgery)   We will be checking your blood type before surgery so in case of emergencies, we will know what type of blood you would need.                                             WHAT IS A BLOOD TRANSFUSION?   A transfusion is the replacement of blood or some of its parts. Blood is made up of multiple cells which provide different functions. Red blood cells carry oxygen and are used for blood loss replacement. White blood cells fight against infection. Platelets control bleeding. Plasma helps clot blood. Other blood products are available for specialized needs, such as hemophilia or other clotting disorders. BEFORE THE TRANSFUSION  Who gives blood for transfusions?  You may be able to donate blood to be used at a later date on yourself (autologous donation). Relatives can be asked to donate blood. This is generally not any safer than if you have received blood from a stranger. The same precautions are taken to ensure safety when a relative's blood is donated. Healthy volunteers who are fully evaluated to make sure their blood is safe. This is blood bank blood. Transfusion therapy is the safest it has ever been in the practice of medicine. Before blood is taken from a donor, a complete history is taken to make sure that person has no history of diseases nor engages in risky social behavior (examples are intravenous drug use or sexual activity with multiple partners). The donor's travel history is screened to  minimize risk of transmitting infections, such as malaria. The donated blood is tested for signs of infectious diseases, such as HIV and hepatitis. The blood is then tested to be sure it is compatible with you in order to minimize the chance of a transfusion reaction. If you or a relative donates blood, this is often done in anticipation of surgery and is not appropriate for emergency situations. It takes many days to process the donated blood. RISKS AND COMPLICATIONS Although transfusion therapy is very safe and saves many lives, the main dangers of transfusion include:  Getting an infectious disease. Developing a transfusion reaction. This is an allergic reaction to something in the blood you were given. Every precaution is taken to prevent this. The decision to have a blood transfusion has been considered  carefully by your caregiver before blood is given. Blood is not given unless the benefits outweigh the risks.   AFTER SURGERY INSTRUCTIONS   Return to work: 4-6 weeks if applicable   You will have a white honeycomb dressing over your larger incision. This dressing can be removed 5 days after surgery and you do not need to reapply a new dressing. Once you remove the dressing, you will notice that you have the surgical glue (dermabond) on the incision and this will peel off on its own. You can get this dressing wet in the shower the days after surgery prior to removal on the 5th day.    Activity: 1. Be up and out of the bed during the day.  Take a nap if needed.  You may walk up steps but be careful and use the hand rail.  Stair climbing will tire you more than you think, you may need to stop part way and rest.    2. No lifting or straining for 6 weeks over 10 pounds. No pushing, pulling, straining for 6 weeks.   3. No driving for around 1 week(s).  Do not drive if you are taking narcotic pain medicine and make sure that your reaction time has returned.    4. You can shower as soon as the next  day after surgery. Shower daily.  Use your regular soap and water (not directly on the incision) and pat your incision(s) dry afterwards; don't rub.  No tub baths or submerging your body in water until cleared by your surgeon. If you have the soap that was given to you by pre-surgical testing that was used before surgery, you do not need to use it afterwards because this can irritate your incisions.    5. No sexual activity and nothing in the vagina for 4-6 weeks.   6. You may experience a small amount of clear drainage from your incisions, which is normal.  If the drainage persists, increases, or changes color please call the office.   7. Do not use creams, lotions, or ointments such as neosporin on your incisions after surgery until advised by your surgeon because they can cause removal of the dermabond glue on your incisions.     8. Take Tylenol or ibuprofen first for pain if you are able to take these medications and only use narcotic pain medication for severe pain not relieved by the Tylenol or Ibuprofen.  Monitor your Tylenol intake to a max of 4,000 mg in a 24 hour period. You can alternate these medications after surgery.   Diet: 1. Low sodium Heart Healthy Diet is recommended but you are cleared to resume your normal (before surgery) diet after your procedure.   2. It is safe to use a laxative, such as Miralax or Colace, if you have difficulty moving your bowels. You will be prescribed Sennakot-S to take at bedtime every evening after surgery to keep bowel movements regular and to prevent constipation.     Wound Care: 1. Keep clean and dry.  Shower daily.   Reasons to call the Doctor: Fever - Oral temperature greater than 100.4 degrees Fahrenheit Foul-smelling vaginal discharge Difficulty urinating Nausea and vomiting Increased pain at the site of the incision that is unrelieved with pain medicine. Difficulty breathing with or without chest pain New calf pain especially if only on  one side Sudden, continuing increased vaginal bleeding with or without clots.   Contacts: For questions or concerns you should contact:   Dr. Belenda Cruise  Berline Lopes at [phone]   Joylene John, NP at [phone]   After Hours: call [phone] and have the GYN Oncologist paged/contacted (after 5 pm or on the weekends). You will speak with an after hours RN and let he or she know you have had surgery.   Messages sent via mychart are for non-urgent matters and are not responded to after hours so for urgent needs, please call the after hours number.

## 2022-04-08 NOTE — Progress Notes (Signed)
Patient here for follow up with Dr. Berline Lopes and for a pre-operative appointment prior to her scheduled surgery on April 13, 2022. She is scheduled for diagnostic laparoscopy, robotic assisted laparoscopic versus open right salpingo-oophorectomy, possible staging if a precancer or cancer is seen. The surgery was discussed in detail.  See after visit summary for additional details.      Discussed post-op pain management in detail including the aspects of the enhanced recovery pathway. Pt is aware of need for sennakot-S use. She has a new patient appointment on April 10th with a pain clinic. She reports not seeing her last pain MD in two months. She reports having sennakot-S at home.   Discussed the use of SCDs and measures to take at home to prevent DVT including frequent mobility.  Reportable signs and symptoms of DVT discussed. Post-operative instructions discussed and expectations for after surgery. Incisional care discussed as well including reportable signs and symptoms including erythema, drainage, wound separation.     10 minutes spent preparing information and with the patient.  Verbalizing understanding of material discussed. No needs or concerns voiced at the end of the visit.   Advised patient to call for any needs.   This appointment is included in the global surgical bundle as pre-operative teaching and has no charge.

## 2022-04-08 NOTE — Patient Instructions (Signed)
Preparing for your Surgery  Plan for surgery on April 13, 2022 with Dr. Jeral Pinch at [place] will be scheduled for diagnostic laparoscopy (looking into the abdomen with a camera), robotic assisted laparoscopic versus open right salpingo-oophorectomy, possible staging if a precancer or cancer is seen.   Pre-operative Testing -You will receive a phone call from presurgical testing at [facility] to arrange for a pre-operative appointment and lab work.  -Bring your insurance card, copy of an advanced directive if applicable, medication list  -At that visit, you will be asked to sign a consent for a possible blood transfusion in case a transfusion becomes necessary during surgery.  The need for a blood transfusion is rare but having consent is a necessary part of your care.     -You should not be taking blood thinners or aspirin at least ten days prior to surgery unless instructed by your surgeon.  -Do not take supplements such as fish oil (omega 3), red yeast rice, turmeric before your surgery. You want to avoid medications with aspirin in them including headache powders such as BC or Goody's), Excedrin migraine.  Day Before Surgery at [place] will be asked to take in a light diet the day before surgery. You will be advised you can have clear liquids up until 3 hours before your surgery.    Eat a light diet the day before surgery.  Examples including soups, broths, toast, yogurt, mashed potatoes.  AVOID GAS PRODUCING FOODS AND BEVERAGES. Things to avoid include carbonated beverages (fizzy beverages, sodas), raw fruits and raw vegetables (uncooked), or beans.   If your bowels are filled with gas, your surgeon will have difficulty visualizing your pelvic organs which increases your surgical risks.  Your role in recovery Your role is to become active as soon as directed by your doctor, while still giving yourself time to heal.  Rest when you feel tired. You will be  asked to do the following in order to speed your recovery:  - Cough and breathe deeply. This helps to clear and expand your lungs and can prevent pneumonia after surgery.  - [place]. Do mild physical activity. Walking or moving your legs help your circulation and body functions return to normal. Do not try to get up or walk alone the first time after surgery.   -If you develop swelling on one leg or the other, pain in the back of your leg, redness/warmth in one of your legs, please call the office or go to the Emergency Room to have a doppler to rule out a blood clot. For shortness of breath, chest pain-seek care in the Emergency Room as soon as possible. - Actively manage your pain. Managing your pain lets you move in comfort. We will ask you to rate your pain on a scale of zero to 10. It is your responsibility to tell your doctor or nurse where and how much you hurt so your pain can be treated.  Special Considerations -If you are diabetic, you may be placed on insulin after surgery to have closer control over your blood sugars to promote healing and recovery.  This does not mean that you will be discharged on insulin.  If applicable, your oral antidiabetics will be resumed when you are tolerating a solid diet.  -Your final pathology results from surgery should be available around one week after surgery and the results will be relayed to you when available.  -FMLA forms can  be faxed to [phone] and please allow 5-7 business days for completion.  -Make sure that you have Tylenol and Ibuprofen IF YOU ARE ABLE TO TAKE THESE MEDICATIONS at home to use on a regular basis after surgery for pain control. We recommend alternating the medications every hour to six hours since they work differently and are processed in the body differently for pain relief.  Bowel Regimen -You will be prescribed Sennakot-S to take nightly to prevent constipation especially if you are taking the  narcotic pain medication intermittently.  It is important to prevent constipation and drink adequate amounts of liquids. You can stop taking this medication when you are not taking pain medication and you are back on your normal bowel routine.  Risks of Surgery Risks of surgery are low but include bleeding, infection, damage to surrounding structures, re-operation, blood clots, and very rarely [place].   Blood Transfusion Information (For the consent to be signed before surgery)  We will be checking your blood type before surgery so in case of emergencies, we will know what type of blood you would need.                                            WHAT IS A BLOOD TRANSFUSION?  A transfusion is the replacement of blood or some of its parts. Blood is made up of multiple cells which provide different functions. Red blood cells carry oxygen and are used for blood loss replacement. White blood cells fight against infection. Platelets control bleeding. Plasma helps clot blood. Other blood products are available for specialized needs, such as hemophilia or other clotting disorders. BEFORE THE TRANSFUSION  Who gives blood for transfusions?  You may be able to donate blood to be used at a later date on yourself (autologous donation). Relatives can be asked to donate blood. This is generally not any safer than if you have received blood from a stranger. The same precautions are taken to ensure safety when a relative's blood is donated. Healthy volunteers who are fully evaluated to make sure their blood is safe. This is blood bank blood. Transfusion therapy is the safest it has ever been in the practice of medicine. Before blood is taken from a donor, a complete history is taken to make sure that person has no history of diseases nor engages in risky social behavior (examples are intravenous drug use or sexual activity with multiple partners). The donor's travel history is screened to minimize risk of  transmitting infections, such as malaria. The donated blood is tested for signs of infectious diseases, such as HIV and hepatitis. The blood is then tested to be sure it is compatible with you in order to minimize the chance of a transfusion reaction. If you or a relative donates blood, this is often done in anticipation of surgery and is not appropriate for emergency situations. It takes many days to process the donated blood. RISKS AND COMPLICATIONS Although transfusion therapy is very safe and saves many lives, the main dangers of transfusion include:  Getting an infectious disease. Developing a transfusion reaction. This is an allergic reaction to something in the blood you were given. Every precaution is taken to prevent this. The decision to have a blood transfusion has been considered carefully by your caregiver before blood is given. Blood is not given unless the benefits outweigh the risks.  AFTER SURGERY INSTRUCTIONS  Return to work: 4-6 weeks if applicable  You will have a white honeycomb dressing over your larger incision. This dressing can be removed 5 days after surgery and you do not need to reapply a new dressing. Once you remove the dressing, you will notice that you have the surgical glue (dermabond) on the incision and this will peel off on its own. You can get this dressing wet in the shower the days after surgery prior to removal on the 5th day.   Activity: 1. Be up and out of the bed during the day.  Take a nap if needed.  You may walk up steps but be careful and use the hand rail.  Stair climbing will tire you more than you think, you may need to stop part way and rest.   2. No lifting or straining for 6 weeks over 10 pounds. No pushing, pulling, straining for 6 weeks.  3. No driving for around 1 week(s).  Do not drive if you are taking narcotic pain medicine and make sure that your reaction time has returned.   4. You can shower as soon as the next day after surgery.  Shower daily.  Use your regular soap and water (not directly on the incision) and pat your incision(s) dry afterwards; don't rub.  No tub baths or submerging your body in water until cleared by your surgeon. If you have the soap that was given to you by pre-surgical testing that was used before surgery, you do not need to use it afterwards because this can irritate your incisions.   5. No sexual activity and nothing in the vagina for 4-6 weeks.  6. You may experience a small amount of clear drainage from your incisions, which is normal.  If the drainage persists, increases, or changes color please call the office.  7. Do not use creams, lotions, or ointments such as neosporin on your incisions after surgery until advised by your surgeon because they can cause removal of the dermabond glue on your incisions.    8. Take Tylenol or ibuprofen first for pain if you are able to take these medications and only use narcotic pain medication for severe pain not relieved by the Tylenol or Ibuprofen.  Monitor your Tylenol intake to a max of 4,000 mg in a 24 hour period. You can alternate these medications after surgery.  Diet: 1. Low sodium Heart Healthy Diet is recommended but you are cleared to resume your normal (before surgery) diet after your procedure.  2. It is safe to use a laxative, such as Miralax or Colace, if you have difficulty moving your bowels. You will be prescribed Sennakot-S to take at bedtime every evening after surgery to keep bowel movements regular and to prevent constipation.    Wound Care: 1. Keep clean and dry.  Shower daily.  Reasons to call the Doctor: Fever - Oral temperature greater than 100.4 degrees Fahrenheit Foul-smelling vaginal discharge Difficulty urinating Nausea and vomiting Increased pain at the site of the incision that is unrelieved with pain medicine. Difficulty breathing with or without chest pain New calf pain especially if only on one side Sudden,  continuing increased vaginal bleeding with or without clots.   Contacts: For questions or concerns you should contact:  Dr. Jeral Pinch at [phone]  Joylene John, NP at [phone]  After Hours: call [phone] and have the GYN Oncologist paged/contacted (after 5 pm or on the weekends). You will speak with an after hours RN and let he or  she know you have had surgery.  Messages sent via mychart are for non-urgent matters and are not responded to after hours so for urgent needs, please call the after hours number.

## 2022-04-10 LAB — ESTRADIOL: Estradiol: 48.4 pg/mL

## 2022-04-11 NOTE — Transitions of Care (Post Inpatient/ED Visit) (Signed)
   04/11/2022  Name: Christine Grant MRN: [id number] DOB: Apr 26, 1975  Today's TOC FU Call Status: Today's TOC FU Call Status:: Unsuccessful Call (3rd Attempt) Unsuccessful Call (1st Attempt) Date: 04/04/22 Unsuccessful Call (2nd Attempt) Date: 04/07/22 Unsuccessful Call (3rd Attempt) Date: 04/11/22  Attempted to reach the patient regarding the most recent Inpatient/ED visit.  Follow Up Plan: No further outreach attempts will be made at this time. We have been unable to contact the patient.  Signature Tinnie Gens, RN BSN

## 2022-04-12 ENCOUNTER — Telehealth: Payer: Self-pay

## 2022-04-12 LAB — TESTOSTERONE, FREE, TOTAL, SHBG
Sex Hormone Binding: 34 nmol/L (ref 24.6–122.0)
Testosterone, Free: 4.8 pg/mL — ABNORMAL HIGH (ref 0.0–4.2)
Testosterone: 208 ng/dL — ABNORMAL HIGH (ref 4–50)

## 2022-04-12 NOTE — Telephone Encounter (Signed)
Telephone call to check on pre-operative status.  Patient compliant with pre-operative instructions.  Reinforced nothing to eat after midnight. Clear liquids until 0515. Patient to arrive at [place].  No questions or concerns voiced.  Instructed to call for any needs.  Pt following all diabetic medications as instructed; Mounjaro stopped x 1 week Farxiga: last dose was 3/30 Lantus: 4/2 AM prescribed dose, PM 1/2 dose Lantus 4/3 1/2 dose Aware no diabetic pills on 4/3

## 2022-04-13 ENCOUNTER — Other Ambulatory Visit: Payer: Self-pay

## 2022-04-13 ENCOUNTER — Encounter (HOSPITAL_COMMUNITY)
Admission: AD | Disposition: A | Discharge status: Home / Self Care | Payer: Self-pay | Source: Ambulatory Visit | Attending: Gynecologic Oncology

## 2022-04-13 ENCOUNTER — Inpatient Hospital Stay (HOSPITAL_COMMUNITY)
Admission: AD | Admit: 2022-04-13 | Discharge: 2022-04-17 | DRG: 737 | Disposition: A | Discharge status: Home / Self Care | Payer: Managed Care, Other (non HMO) | Source: Ambulatory Visit | Attending: Gynecologic Oncology | Admitting: Gynecologic Oncology

## 2022-04-13 ENCOUNTER — Ambulatory Visit (HOSPITAL_COMMUNITY): Payer: Managed Care, Other (non HMO) | Admitting: Physician Assistant

## 2022-04-13 ENCOUNTER — Encounter (HOSPITAL_COMMUNITY): Payer: Self-pay | Admitting: Gynecologic Oncology

## 2022-04-13 ENCOUNTER — Ambulatory Visit (HOSPITAL_COMMUNITY): Payer: Managed Care, Other (non HMO) | Admitting: Certified Registered"

## 2022-04-13 DIAGNOSIS — Z833 Family history of diabetes mellitus: Secondary | ICD-10-CM | POA: Diagnosis not present

## 2022-04-13 DIAGNOSIS — F5101 Primary insomnia: Secondary | ICD-10-CM

## 2022-04-13 DIAGNOSIS — Z7985 Long-term (current) use of injectable non-insulin antidiabetic drugs: Secondary | ICD-10-CM

## 2022-04-13 DIAGNOSIS — E871 Hypo-osmolality and hyponatremia: Secondary | ICD-10-CM | POA: Diagnosis present

## 2022-04-13 DIAGNOSIS — R000 Tachycardia, unspecified: Secondary | ICD-10-CM | POA: Diagnosis not present

## 2022-04-13 DIAGNOSIS — E1169 Type 2 diabetes mellitus with other specified complication: Secondary | ICD-10-CM

## 2022-04-13 DIAGNOSIS — E119 Type 2 diabetes mellitus without complications: Secondary | ICD-10-CM | POA: Diagnosis present

## 2022-04-13 DIAGNOSIS — E1165 Type 2 diabetes mellitus with hyperglycemia: Secondary | ICD-10-CM

## 2022-04-13 DIAGNOSIS — M170 Bilateral primary osteoarthritis of knee: Secondary | ICD-10-CM | POA: Diagnosis present

## 2022-04-13 DIAGNOSIS — K660 Peritoneal adhesions (postprocedural) (postinfection): Secondary | ICD-10-CM

## 2022-04-13 DIAGNOSIS — K5710 Diverticulosis of small intestine without perforation or abscess without bleeding: Secondary | ICD-10-CM | POA: Diagnosis present

## 2022-04-13 DIAGNOSIS — Z79899 Other long term (current) drug therapy: Secondary | ICD-10-CM

## 2022-04-13 DIAGNOSIS — C561 Malignant neoplasm of right ovary: Secondary | ICD-10-CM | POA: Diagnosis present

## 2022-04-13 DIAGNOSIS — M797 Fibromyalgia: Secondary | ICD-10-CM | POA: Diagnosis present

## 2022-04-13 DIAGNOSIS — Z794 Long term (current) use of insulin: Secondary | ICD-10-CM | POA: Diagnosis not present

## 2022-04-13 DIAGNOSIS — Z803 Family history of malignant neoplasm of breast: Secondary | ICD-10-CM | POA: Diagnosis not present

## 2022-04-13 DIAGNOSIS — I10 Essential (primary) hypertension: Secondary | ICD-10-CM | POA: Diagnosis not present

## 2022-04-13 DIAGNOSIS — N83299 Other ovarian cyst, unspecified side: Principal | ICD-10-CM

## 2022-04-13 DIAGNOSIS — Z8249 Family history of ischemic heart disease and other diseases of the circulatory system: Secondary | ICD-10-CM

## 2022-04-13 DIAGNOSIS — D3911 Neoplasm of uncertain behavior of right ovary: Secondary | ICD-10-CM

## 2022-04-13 DIAGNOSIS — G8929 Other chronic pain: Secondary | ICD-10-CM | POA: Diagnosis present

## 2022-04-13 DIAGNOSIS — Z7984 Long term (current) use of oral hypoglycemic drugs: Secondary | ICD-10-CM

## 2022-04-13 DIAGNOSIS — Z83438 Family history of other disorder of lipoprotein metabolism and other lipidemia: Secondary | ICD-10-CM | POA: Diagnosis not present

## 2022-04-13 DIAGNOSIS — Z6841 Body Mass Index (BMI) 40.0 and over, adult: Secondary | ICD-10-CM

## 2022-04-13 DIAGNOSIS — N838 Other noninflammatory disorders of ovary, fallopian tube and broad ligament: Secondary | ICD-10-CM

## 2022-04-13 DIAGNOSIS — E669 Obesity, unspecified: Secondary | ICD-10-CM | POA: Diagnosis present

## 2022-04-13 HISTORY — PX: LAPAROSCOPY: SHX197

## 2022-04-13 HISTORY — PX: LYSIS OF ADHESION: SHX5961

## 2022-04-13 HISTORY — PX: EXCISION OF ADNEXAL MASS: SHX5820

## 2022-04-13 HISTORY — DX: Other noninflammatory disorders of ovary, fallopian tube and broad ligament: N83.8

## 2022-04-13 HISTORY — PX: BOWEL RESECTION: SHX1257

## 2022-04-13 LAB — GLUCOSE, CAPILLARY
Glucose-Capillary: 140 mg/dL — ABNORMAL HIGH (ref 70–99)
Glucose-Capillary: 245 mg/dL — ABNORMAL HIGH (ref 70–99)
Glucose-Capillary: 232 mg/dL — ABNORMAL HIGH (ref 70–99)
Glucose-Capillary: 252 mg/dL — ABNORMAL HIGH (ref 70–99)

## 2022-04-13 SURGERY — LAPAROSCOPY, DIAGNOSTIC
Anesthesia: General | Laterality: Right

## 2022-04-13 MED ORDER — PHENYLEPHRINE 80 MCG/ML (10ML) SYRINGE FOR IV PUSH (FOR BLOOD PRESSURE SUPPORT)
PREFILLED_SYRINGE | INTRAVENOUS | Status: AC
  Filled 2022-04-13: qty 10

## 2022-04-13 MED ORDER — INSULIN ASPART 100 UNIT/ML IJ SOLN
0.0000 [IU] | Freq: Three times a day (TID) | INTRAMUSCULAR | Status: DC
  Administered 2022-04-13 – 2022-04-15 (×5): 5 [IU] via SUBCUTANEOUS
  Administered 2022-04-15: 3 [IU] via SUBCUTANEOUS
  Administered 2022-04-15: 5 [IU] via SUBCUTANEOUS

## 2022-04-13 MED ORDER — LIDOCAINE 2% (20 MG/ML) 5 ML SYRINGE
INTRAMUSCULAR | Status: DC | PRN
  Administered 2022-04-13: 60 mg via INTRAVENOUS

## 2022-04-13 MED ORDER — METOPROLOL SUCCINATE ER 25 MG PO TB24: 25.0000 mg | ORAL_TABLET | Freq: Every day | ORAL | Status: DC

## 2022-04-13 MED ORDER — ACETAMINOPHEN 500 MG PO TABS
1000.0000 mg | ORAL_TABLET | ORAL | Status: AC
  Administered 2022-04-13: 1000 mg via ORAL
  Filled 2022-04-13: qty 2

## 2022-04-13 MED ORDER — BUPIVACAINE HCL 0.25 % IJ SOLN
INTRAMUSCULAR | Status: AC
  Filled 2022-04-13: qty 1

## 2022-04-13 MED ORDER — HYDROMORPHONE HCL 1 MG/ML IJ SOLN
0.2000 mg | INTRAMUSCULAR | Status: DC | PRN
  Administered 2022-04-13 – 2022-04-14 (×5): 0.5 mg via INTRAVENOUS
  Administered 2022-04-15: 0.6 mg via INTRAVENOUS
  Administered 2022-04-15 (×3): 0.5 mg via INTRAVENOUS
  Administered 2022-04-16: 0.2 mg via INTRAVENOUS
  Administered 2022-04-16 – 2022-04-17 (×2): 0.5 mg via INTRAVENOUS
  Filled 2022-04-13 (×12): qty 1

## 2022-04-13 MED ORDER — DIPHENHYDRAMINE HCL 50 MG/ML IJ SOLN
INTRAMUSCULAR | Status: DC | PRN
  Administered 2022-04-13: 12.5 mg via INTRAVENOUS

## 2022-04-13 MED ORDER — DOCUSATE SODIUM 100 MG PO CAPS
100.0000 mg | ORAL_CAPSULE | Freq: Every day | ORAL | Status: DC
  Administered 2022-04-13 – 2022-04-17 (×5): 100 mg via ORAL
  Filled 2022-04-13 (×5): qty 1

## 2022-04-13 MED ORDER — SUGAMMADEX SODIUM 200 MG/2ML IV SOLN
INTRAVENOUS | Status: DC | PRN
  Administered 2022-04-13: 230 mg via INTRAVENOUS

## 2022-04-13 MED ORDER — QUETIAPINE FUMARATE 50 MG PO TABS
50.0000 mg | ORAL_TABLET | Freq: Every day | ORAL | Status: DC
  Administered 2022-04-14: 50 mg via ORAL
  Filled 2022-04-13 (×3): qty 1

## 2022-04-13 MED ORDER — KETAMINE HCL 10 MG/ML IJ SOLN
INTRAMUSCULAR | Status: DC | PRN
  Administered 2022-04-13 (×3): 10 mg via INTRAVENOUS
  Administered 2022-04-13: 20 mg via INTRAVENOUS

## 2022-04-13 MED ORDER — 0.9 % SODIUM CHLORIDE (POUR BTL) OPTIME
TOPICAL | Status: DC | PRN
  Administered 2022-04-13: 3000 mL

## 2022-04-13 MED ORDER — ACETAMINOPHEN 500 MG PO TABS
1000.0000 mg | ORAL_TABLET | Freq: Four times a day (QID) | ORAL | Status: DC
  Administered 2022-04-13 – 2022-04-17 (×15): 1000 mg via ORAL
  Filled 2022-04-13 (×16): qty 2

## 2022-04-13 MED ORDER — PANTOPRAZOLE SODIUM 40 MG IV SOLR
40.0000 mg | Freq: Every day | INTRAVENOUS | Status: DC
  Administered 2022-04-13 – 2022-04-15 (×3): 40 mg via INTRAVENOUS
  Filled 2022-04-13 (×3): qty 10

## 2022-04-13 MED ORDER — FENTANYL CITRATE (PF) 100 MCG/2ML IJ SOLN
INTRAMUSCULAR | Status: DC | PRN
  Administered 2022-04-13: 100 ug via INTRAVENOUS

## 2022-04-13 MED ORDER — TRAMADOL HCL 50 MG PO TABS
50.0000 mg | ORAL_TABLET | Freq: Four times a day (QID) | ORAL | Status: DC | PRN
  Administered 2022-04-14 – 2022-04-16 (×4): 50 mg via ORAL
  Filled 2022-04-13 (×4): qty 1

## 2022-04-13 MED ORDER — ROCURONIUM BROMIDE 10 MG/ML (PF) SYRINGE
PREFILLED_SYRINGE | INTRAVENOUS | Status: AC
  Filled 2022-04-13: qty 10

## 2022-04-13 MED ORDER — BUPIVACAINE LIPOSOME 1.3 % IJ SUSP
INTRAMUSCULAR | Status: DC | PRN
  Administered 2022-04-13: 20 mL

## 2022-04-13 MED ORDER — MIDAZOLAM HCL 2 MG/2ML IJ SOLN
INTRAMUSCULAR | Status: DC | PRN
  Administered 2022-04-13: 2 mg via INTRAVENOUS

## 2022-04-13 MED ORDER — PROPOFOL 10 MG/ML IV BOLUS
INTRAVENOUS | Status: DC | PRN
  Administered 2022-04-13: 200 mg via INTRAVENOUS

## 2022-04-13 MED ORDER — DIPHENHYDRAMINE HCL 50 MG/ML IJ SOLN
INTRAMUSCULAR | Status: AC
  Filled 2022-04-13: qty 1

## 2022-04-13 MED ORDER — ROCURONIUM BROMIDE 10 MG/ML (PF) SYRINGE
PREFILLED_SYRINGE | INTRAVENOUS | Status: DC | PRN
  Administered 2022-04-13: 100 mg via INTRAVENOUS
  Administered 2022-04-13: 10 mg via INTRAVENOUS
  Administered 2022-04-13: 30 mg via INTRAVENOUS
  Administered 2022-04-13: 20 mg via INTRAVENOUS
  Administered 2022-04-13 (×2): 10 mg via INTRAVENOUS

## 2022-04-13 MED ORDER — MIDAZOLAM HCL 2 MG/2ML IJ SOLN
INTRAMUSCULAR | Status: AC
  Filled 2022-04-13: qty 2

## 2022-04-13 MED ORDER — INSULIN ASPART 100 UNIT/ML IJ SOLN: 5.0000 [IU] | Freq: Once | INTRAMUSCULAR | Status: AC

## 2022-04-13 MED ORDER — ONDANSETRON HCL 4 MG/2ML IJ SOLN
4.0000 mg | Freq: Four times a day (QID) | INTRAMUSCULAR | Status: DC | PRN
  Administered 2022-04-13 – 2022-04-15 (×4): 4 mg via INTRAVENOUS
  Filled 2022-04-13 (×4): qty 2

## 2022-04-13 MED ORDER — KCL IN DEXTROSE-NACL 10-5-0.45 MEQ/L-%-% IV SOLN
INTRAVENOUS | Status: DC
  Filled 2022-04-13 (×5): qty 1000

## 2022-04-13 MED ORDER — DEXAMETHASONE SODIUM PHOSPHATE 10 MG/ML IJ SOLN
INTRAMUSCULAR | Status: AC
  Filled 2022-04-13: qty 1

## 2022-04-13 MED ORDER — BUPIVACAINE HCL 0.25 % IJ SOLN
INTRAMUSCULAR | Status: DC | PRN
  Administered 2022-04-13: 50 mL

## 2022-04-13 MED ORDER — DEXAMETHASONE SODIUM PHOSPHATE 10 MG/ML IJ SOLN
INTRAMUSCULAR | Status: DC | PRN
  Administered 2022-04-13: 4 mg via INTRAVENOUS

## 2022-04-13 MED ORDER — HYDROMORPHONE HCL 1 MG/ML IJ SOLN
INTRAMUSCULAR | Status: AC
  Administered 2022-04-13: 0.5 mg via INTRAVENOUS
  Filled 2022-04-13: qty 1

## 2022-04-13 MED ORDER — SIMETHICONE 80 MG PO CHEW
80.0000 mg | CHEWABLE_TABLET | Freq: Four times a day (QID) | ORAL | Status: DC | PRN
  Administered 2022-04-14: 80 mg via ORAL
  Filled 2022-04-13: qty 1

## 2022-04-13 MED ORDER — OXYCODONE HCL 5 MG PO TABS: 5.0000 mg | ORAL_TABLET | Freq: Once | ORAL | Status: DC | PRN

## 2022-04-13 MED ORDER — ONDANSETRON HCL 4 MG/2ML IJ SOLN
4.0000 mg | Freq: Once | INTRAMUSCULAR | Status: AC | PRN
  Administered 2022-04-13: 4 mg via INTRAVENOUS

## 2022-04-13 MED ORDER — PHENYLEPHRINE 80 MCG/ML (10ML) SYRINGE FOR IV PUSH (FOR BLOOD PRESSURE SUPPORT)
PREFILLED_SYRINGE | INTRAVENOUS | Status: DC | PRN
  Administered 2022-04-13 (×2): 80 ug via INTRAVENOUS

## 2022-04-13 MED ORDER — POVIDONE-IODINE 10 % EX SWAB: 2.0000 | Freq: Once | CUTANEOUS | Status: DC

## 2022-04-13 MED ORDER — METOCLOPRAMIDE HCL 5 MG/ML IJ SOLN
INTRAMUSCULAR | Status: AC
  Filled 2022-04-13: qty 2

## 2022-04-13 MED ORDER — INSULIN ASPART 100 UNIT/ML IJ SOLN
INTRAMUSCULAR | Status: DC | PRN
  Administered 2022-04-13: 3 [IU] via SUBCUTANEOUS

## 2022-04-13 MED ORDER — CYCLOBENZAPRINE HCL 10 MG PO TABS
10.0000 mg | ORAL_TABLET | Freq: Three times a day (TID) | ORAL | Status: DC | PRN
  Administered 2022-04-14 – 2022-04-17 (×7): 10 mg via ORAL
  Filled 2022-04-13 (×7): qty 1

## 2022-04-13 MED ORDER — FLUOXETINE HCL 20 MG PO CAPS
40.0000 mg | ORAL_CAPSULE | Freq: Every day | ORAL | Status: DC
  Administered 2022-04-14 – 2022-04-17 (×4): 40 mg via ORAL
  Filled 2022-04-13 (×4): qty 2

## 2022-04-13 MED ORDER — FENTANYL CITRATE (PF) 100 MCG/2ML IJ SOLN
INTRAMUSCULAR | Status: AC
  Filled 2022-04-13: qty 2

## 2022-04-13 MED ORDER — ONDANSETRON HCL 4 MG/2ML IJ SOLN
INTRAMUSCULAR | Status: AC
  Filled 2022-04-13: qty 2

## 2022-04-13 MED ORDER — METOCLOPRAMIDE HCL 5 MG/ML IJ SOLN
INTRAMUSCULAR | Status: DC | PRN
  Administered 2022-04-13: 5 mg via INTRAVENOUS

## 2022-04-13 MED ORDER — OXYCODONE HCL 5 MG PO TABS
5.0000 mg | ORAL_TABLET | ORAL | Status: DC | PRN
  Administered 2022-04-14 (×4): 10 mg via ORAL
  Administered 2022-04-14: 5 mg via ORAL
  Administered 2022-04-15 (×4): 10 mg via ORAL
  Administered 2022-04-16 (×2): 5 mg via ORAL
  Administered 2022-04-16 (×2): 10 mg via ORAL
  Administered 2022-04-16 – 2022-04-17 (×3): 5 mg via ORAL
  Filled 2022-04-13 (×3): qty 2
  Filled 2022-04-13: qty 1
  Filled 2022-04-13 (×3): qty 2
  Filled 2022-04-13: qty 1
  Filled 2022-04-13 (×2): qty 2
  Filled 2022-04-13 (×3): qty 1
  Filled 2022-04-13: qty 2
  Filled 2022-04-13: qty 1
  Filled 2022-04-13 (×3): qty 2

## 2022-04-13 MED ORDER — BUSPIRONE HCL 5 MG PO TABS
10.0000 mg | ORAL_TABLET | Freq: Three times a day (TID) | ORAL | Status: DC
  Administered 2022-04-13 – 2022-04-17 (×12): 10 mg via ORAL
  Filled 2022-04-13 (×12): qty 2

## 2022-04-13 MED ORDER — SCOPOLAMINE 1 MG/3DAYS TD PT72
1.0000 | MEDICATED_PATCH | TRANSDERMAL | Status: DC
  Administered 2022-04-13: 1.5 mg via TRANSDERMAL
  Filled 2022-04-13: qty 1

## 2022-04-13 MED ORDER — ONDANSETRON HCL 4 MG PO TABS
4.0000 mg | ORAL_TABLET | Freq: Four times a day (QID) | ORAL | Status: DC | PRN
  Administered 2022-04-14 – 2022-04-15 (×2): 4 mg via ORAL
  Filled 2022-04-13 (×2): qty 1

## 2022-04-13 MED ORDER — CEFAZOLIN SODIUM-DEXTROSE 2-4 GM/100ML-% IV SOLN
INTRAVENOUS | Status: AC
  Filled 2022-04-13: qty 100

## 2022-04-13 MED ORDER — ORAL CARE MOUTH RINSE: 15.0000 mL | Freq: Once | OROMUCOSAL | Status: AC

## 2022-04-13 MED ORDER — LACTATED RINGERS IV SOLN: INTRAVENOUS | Status: DC

## 2022-04-13 MED ORDER — HEPARIN SODIUM (PORCINE) 5000 UNIT/ML IJ SOLN
5000.0000 [IU] | INTRAMUSCULAR | Status: AC
  Administered 2022-04-13: 5000 [IU] via SUBCUTANEOUS
  Filled 2022-04-13: qty 1

## 2022-04-13 MED ORDER — ENOXAPARIN SODIUM 40 MG/0.4ML IJ SOSY
40.0000 mg | PREFILLED_SYRINGE | INTRAMUSCULAR | Status: DC
  Administered 2022-04-14 – 2022-04-17 (×4): 40 mg via SUBCUTANEOUS
  Filled 2022-04-13 (×4): qty 0.4

## 2022-04-13 MED ORDER — ONDANSETRON HCL 4 MG/2ML IJ SOLN
INTRAMUSCULAR | Status: DC | PRN
  Administered 2022-04-13: 4 mg via INTRAVENOUS

## 2022-04-13 MED ORDER — HYDROMORPHONE HCL 1 MG/ML IJ SOLN
INTRAMUSCULAR | Status: DC | PRN
  Administered 2022-04-13 (×5): .4 mg via INTRAVENOUS

## 2022-04-13 MED ORDER — FREESTYLE LIBRE 3 SENSOR MISC: 1.0000 | Status: DC

## 2022-04-13 MED ORDER — KETAMINE HCL 50 MG/5ML IJ SOSY
PREFILLED_SYRINGE | INTRAMUSCULAR | Status: AC
  Filled 2022-04-13: qty 5

## 2022-04-13 MED ORDER — AMISULPRIDE (ANTIEMETIC) 5 MG/2ML IV SOLN
10.0000 mg | Freq: Once | INTRAVENOUS | Status: AC | PRN
  Administered 2022-04-13: 10 mg via INTRAVENOUS

## 2022-04-13 MED ORDER — INSULIN ASPART 100 UNIT/ML IJ SOLN
INTRAMUSCULAR | Status: AC
  Administered 2022-04-13: 5 [IU] via SUBCUTANEOUS
  Filled 2022-04-13: qty 1

## 2022-04-13 MED ORDER — GABAPENTIN 300 MG PO CAPS
1200.0000 mg | ORAL_CAPSULE | Freq: Three times a day (TID) | ORAL | Status: DC
  Administered 2022-04-13 – 2022-04-17 (×12): 1200 mg via ORAL
  Filled 2022-04-13 (×9): qty 4
  Filled 2022-04-13: qty 12
  Filled 2022-04-13 (×2): qty 4

## 2022-04-13 MED ORDER — CHLORHEXIDINE GLUCONATE 0.12 % MT SOLN
15.0000 mL | Freq: Once | OROMUCOSAL | Status: AC
  Administered 2022-04-13: 15 mL via OROMUCOSAL

## 2022-04-13 MED ORDER — PROPOFOL 10 MG/ML IV BOLUS
INTRAVENOUS | Status: AC
  Filled 2022-04-13: qty 20

## 2022-04-13 MED ORDER — METRONIDAZOLE 500 MG/100ML IV SOLN
500.0000 mg | Freq: Once | INTRAVENOUS | Status: AC
  Administered 2022-04-13: 500 mg via INTRAVENOUS
  Filled 2022-04-13: qty 100

## 2022-04-13 MED ORDER — HYDROMORPHONE HCL 1 MG/ML IJ SOLN
0.2500 mg | INTRAMUSCULAR | Status: DC | PRN
  Administered 2022-04-13: 0.5 mg via INTRAVENOUS

## 2022-04-13 MED ORDER — LIDOCAINE HCL (PF) 2 % IJ SOLN
INTRAMUSCULAR | Status: AC
  Filled 2022-04-13: qty 5

## 2022-04-13 MED ORDER — HYDROMORPHONE HCL 2 MG/ML IJ SOLN
INTRAMUSCULAR | Status: AC
  Filled 2022-04-13: qty 1

## 2022-04-13 MED ORDER — AMISULPRIDE (ANTIEMETIC) 5 MG/2ML IV SOLN
INTRAVENOUS | Status: AC
  Filled 2022-04-13: qty 4

## 2022-04-13 MED ORDER — DEXAMETHASONE SODIUM PHOSPHATE 4 MG/ML IJ SOLN: 4.0000 mg | INTRAMUSCULAR | Status: DC

## 2022-04-13 MED ORDER — INSULIN ASPART 100 UNIT/ML IJ SOLN
INTRAMUSCULAR | Status: AC
  Filled 2022-04-13: qty 1

## 2022-04-13 MED ORDER — BUPIVACAINE LIPOSOME 1.3 % IJ SUSP
INTRAMUSCULAR | Status: AC
  Filled 2022-04-13: qty 20

## 2022-04-13 MED ORDER — CEFAZOLIN SODIUM-DEXTROSE 2-3 GM-%(50ML) IV SOLR
INTRAVENOUS | Status: DC | PRN
  Administered 2022-04-13: 2 g via INTRAVENOUS

## 2022-04-13 MED ORDER — STERILE WATER FOR IRRIGATION IR SOLN
Status: DC | PRN
  Administered 2022-04-13: 2000 mL

## 2022-04-13 MED ORDER — OXYCODONE HCL 5 MG/5ML PO SOLN: 5.0000 mg | Freq: Once | ORAL | Status: DC | PRN

## 2022-04-13 SURGICAL SUPPLY — 104 items
ADH SKN CLS APL DERMABOND .7 (GAUZE/BANDAGES/DRESSINGS) ×3
AGENT HMST KT MTR STRL THRMB (HEMOSTASIS)
APL ESCP 34 STRL LF DISP (HEMOSTASIS)
APL PRP STRL LF DISP 70% ISPRP (MISCELLANEOUS) ×3
APPLICATOR SURGIFLO ENDO (HEMOSTASIS) IMPLANT
BAG COUNTER SPONGE SURGICOUNT (BAG) IMPLANT
BAG LAPAROSCOPIC 12 15 PORT 16 (BASKET) IMPLANT
BAG RETRIEVAL 12/15 (BASKET)
BAG SPNG CNTER NS LX DISP (BAG)
BLADE SURG 15 STRL LF DISP TIS (BLADE) IMPLANT
BLADE SURG 15 STRL SS (BLADE) ×3
BLADE SURG SZ10 CARB STEEL (BLADE) IMPLANT
BRR ADH 5X3 SEPRAFILM 6 SHT (MISCELLANEOUS) ×3
CABLE HIGH FREQUENCY MONO STRZ (ELECTRODE) IMPLANT
CHLORAPREP W/TINT 26 (MISCELLANEOUS) ×3 IMPLANT
CNTNR URN SCR LID CUP LEK RST (MISCELLANEOUS) IMPLANT
CONT SPEC 4OZ STRL OR WHT (MISCELLANEOUS)
COVER BACK TABLE 60X90IN (DRAPES) ×3 IMPLANT
COVER SURGICAL LIGHT HANDLE (MISCELLANEOUS) ×3 IMPLANT
COVER TIP SHEARS 8 DVNC (MISCELLANEOUS) ×3 IMPLANT
COVER TIP SHEARS 8MM DA VINCI (MISCELLANEOUS)
DERMABOND ADVANCED .7 DNX12 (GAUZE/BANDAGES/DRESSINGS) ×3 IMPLANT
DRAPE SHEET LG 3/4 BI-LAMINATE (DRAPES) ×3 IMPLANT
DRAPE SURG IRRIG POUCH 19X23 (DRAPES) ×3 IMPLANT
DRSG OPSITE POSTOP 4X10 (GAUZE/BANDAGES/DRESSINGS) IMPLANT
DRSG OPSITE POSTOP 4X6 (GAUZE/BANDAGES/DRESSINGS) IMPLANT
DRSG OPSITE POSTOP 4X8 (GAUZE/BANDAGES/DRESSINGS) IMPLANT
ELECT PENCIL ROCKER SW 15FT (MISCELLANEOUS) IMPLANT
ELECT REM PT RETURN 15FT ADLT (MISCELLANEOUS) ×3 IMPLANT
GAUZE 4X4 16PLY ~~LOC~~+RFID DBL (SPONGE) ×6 IMPLANT
GLOVE BIO SURGEON STRL SZ 6 (GLOVE) ×12 IMPLANT
GLOVE BIO SURGEON STRL SZ 6.5 (GLOVE) ×6 IMPLANT
GOWN STRL REUS W/ TWL LRG LVL3 (GOWN DISPOSABLE) ×12 IMPLANT
GOWN STRL REUS W/TWL LRG LVL3 (GOWN DISPOSABLE) ×12
GRASPER SUT TROCAR 14GX15 (MISCELLANEOUS) IMPLANT
HOLDER FOLEY CATH W/STRAP (MISCELLANEOUS) IMPLANT
IRRIG SUCT STRYKERFLOW 2 WTIP (MISCELLANEOUS)
IRRIGATION SUCT STRKRFLW 2 WTP (MISCELLANEOUS) ×3 IMPLANT
KIT BASIN OR (CUSTOM PROCEDURE TRAY) ×3 IMPLANT
KIT PROCEDURE DA VINCI SI (MISCELLANEOUS)
KIT PROCEDURE DVNC SI (MISCELLANEOUS) IMPLANT
KIT TURNOVER KIT A (KITS) IMPLANT
LIGASURE IMPACT 36 18CM CVD LR (INSTRUMENTS) IMPLANT
MANIPULATOR ADVINCU DEL 3.0 PL (MISCELLANEOUS) IMPLANT
MANIPULATOR ADVINCU DEL 3.5 PL (MISCELLANEOUS) IMPLANT
MANIPULATOR UTERINE 4.5 ZUMI (MISCELLANEOUS) IMPLANT
NDL HYPO 21X1.5 SAFETY (NEEDLE) ×3 IMPLANT
NDL SPNL 18GX3.5 QUINCKE PK (NEEDLE) IMPLANT
NEEDLE HYPO 21X1.5 SAFETY (NEEDLE) ×3 IMPLANT
NEEDLE SPNL 18GX3.5 QUINCKE PK (NEEDLE) IMPLANT
PACK ROBOT GYN CUSTOM WL (TRAY / TRAY PROCEDURE) ×3 IMPLANT
PAD POSITIONING PINK XL (MISCELLANEOUS) ×3 IMPLANT
PORT ACCESS TROCAR AIRSEAL 12 (TROCAR) IMPLANT
RELOAD PROXIMATE 75MM BLUE (ENDOMECHANICALS) ×18 IMPLANT
RELOAD PROXIMATE TA60MM BLUE (ENDOMECHANICALS) ×3 IMPLANT
RELOAD STAPLE 60 BLU REG PROX (ENDOMECHANICALS) IMPLANT
RELOAD STAPLE 75 3.8 BLU REG (ENDOMECHANICALS) IMPLANT
RETRACTOR WND ALEXIS 25 LRG (MISCELLANEOUS) IMPLANT
RTRCTR WOUND ALEXIS 25CM LRG (MISCELLANEOUS) ×3
SCISSORS LAP 5X35 DISP (ENDOMECHANICALS) IMPLANT
SEALER TISSUE G2 CVD JAW 45CM (ENDOMECHANICALS) IMPLANT
SEPRAFILM PROCEDURAL PACK 3X5 (MISCELLANEOUS) IMPLANT
SET TRI-LUMEN FLTR TB AIRSEAL (TUBING) ×3 IMPLANT
SHEET LAVH (DRAPES) ×3 IMPLANT
SLEEVE Z-THREAD 5X100MM (TROCAR) ×3 IMPLANT
SPIKE FLUID TRANSFER (MISCELLANEOUS) ×3 IMPLANT
SPONGE T-LAP 18X18 ~~LOC~~+RFID (SPONGE) IMPLANT
STAPLER GUN LINEAR PROX 60 (STAPLE) IMPLANT
STAPLER PROXIMATE 75MM BLUE (STAPLE) IMPLANT
STAPLER VISISTAT (STAPLE) IMPLANT
SURGIFLO W/THROMBIN 8M KIT (HEMOSTASIS) IMPLANT
SUT MNCRL AB 4-0 PS2 18 (SUTURE) ×6 IMPLANT
SUT PDS AB 1 TP1 96 (SUTURE) IMPLANT
SUT V-LOC 180 0-0 GS22 (SUTURE) IMPLANT
SUT VIC AB 0 CT1 27 (SUTURE)
SUT VIC AB 0 CT1 27XBRD ANTBC (SUTURE) IMPLANT
SUT VIC AB 2-0 CT1 27 (SUTURE)
SUT VIC AB 2-0 CT1 36 (SUTURE) IMPLANT
SUT VIC AB 2-0 CT1 TAPERPNT 27 (SUTURE) IMPLANT
SUT VIC AB 2-0 SH 18 (SUTURE) IMPLANT
SUT VIC AB 2-0 SH 27 (SUTURE) ×3
SUT VIC AB 2-0 SH 27XBRD (SUTURE) IMPLANT
SUT VIC AB 4-0 PS2 18 (SUTURE) ×6 IMPLANT
SUT VICRYL 0 27 CT2 27 ABS (SUTURE) ×3 IMPLANT
SUT VLOC 180 0 9IN  GS21 (SUTURE)
SUT VLOC 180 0 9IN GS21 (SUTURE) IMPLANT
SYR 10ML LL (SYRINGE) IMPLANT
SYS BAG RETRIEVAL 10MM (BASKET)
SYS RETRIEVAL 5MM INZII UNIV (BASKET)
SYS WOUND ALEXIS 18CM MED (MISCELLANEOUS)
SYSTEM BAG RETRIEVAL 10MM (BASKET) IMPLANT
SYSTEM RETRIEVL 5MM INZII UNIV (BASKET) IMPLANT
SYSTEM WOUND ALEXIS 18CM MED (MISCELLANEOUS) IMPLANT
TOWEL OR 17X26 10 PK STRL BLUE (TOWEL DISPOSABLE) ×3 IMPLANT
TOWEL OR NON WOVEN STRL DISP B (DISPOSABLE) ×3 IMPLANT
TRAP SPECIMEN MUCUS 40CC (MISCELLANEOUS) IMPLANT
TRAY FOLEY MTR SLVR 16FR STAT (SET/KITS/TRAYS/PACK) ×3 IMPLANT
TRAY LAPAROSCOPIC (CUSTOM PROCEDURE TRAY) ×3 IMPLANT
TROCAR ADV FIXATION 12X100MM (TROCAR) IMPLANT
TROCAR BALLN 12MMX100 BLUNT (TROCAR) IMPLANT
TROCAR Z-THREAD OPTICAL 5X100M (TROCAR) ×3 IMPLANT
UNDERPAD 30X36 HEAVY ABSORB (UNDERPADS AND DIAPERS) ×6 IMPLANT
WATER STERILE IRR 1000ML POUR (IV SOLUTION) ×3 IMPLANT
YANKAUER SUCT BULB TIP 10FT TU (MISCELLANEOUS) IMPLANT

## 2022-04-13 NOTE — Transfer of Care (Signed)
Immediate Anesthesia Transfer of Care Note  Patient: Christine Grant  Procedure(s) Performed: LAPAROSCOPY DIAGNOSTIC EXCISION OF ADNEXAL MASS (Right) LYSIS OF ADHESION SMALL BOWEL RESECTION  Patient Location: PACU  Anesthesia Type:General  Level of Consciousness: awake, alert , and oriented  Airway & Oxygen Therapy: Patient Spontanous Breathing and Patient connected to face mask oxygen  Post-op Assessment: Report given to RN, Post -op Vital signs reviewed and stable, and Patient moving all extremities X 4  Post vital signs: Reviewed and stable  Last Vitals:  Vitals Value Taken Time  BP 128/71 04/13/22 1300  Temp    Pulse 107 04/13/22 1301  Resp 19 04/13/22 1301  SpO2 97 % 04/13/22 1301  Vitals shown include unvalidated device data.  Last Pain:  Vitals:   04/13/22 0720  TempSrc:   PainSc: 7          Complications: No notable events documented.

## 2022-04-13 NOTE — Anesthesia Preprocedure Evaluation (Addendum)
Anesthesia Evaluation  Patient identified by MRN, date of birth, ID band Patient awake    Reviewed: Allergy & Precautions, H&P , NPO status , Patient's Chart, lab work & pertinent test results, reviewed documented beta blocker date and time   History of Anesthesia Complications (+) PONV and history of anesthetic complications  Airway Mallampati: III  TM Distance: >3 FB Neck ROM: Full    Dental  (+) Teeth Intact, Dental Advisory Given   Pulmonary neg pulmonary ROS   Pulmonary exam normal breath sounds clear to auscultation       Cardiovascular hypertension (148/83 preop, per pt normally slightly lower), Pt. on home beta blockers and Pt. on medications Normal cardiovascular exam+ dysrhythmias (sinus tachycardia on metoprolol)  Rhythm:Regular Rate:Normal     Neuro/Psych  PSYCHIATRIC DISORDERS Anxiety Depression    negative neurological ROS     GI/Hepatic negative GI ROS, Neg liver ROS,,,  Endo/Other  diabetes, Poorly Controlled, Type 2, Insulin Dependent  Morbid obesityBMI 41 A1c 10 FS this AM 140  Renal/GU negative Renal ROS  negative genitourinary   Musculoskeletal  (+) Arthritis , Osteoarthritis,  Fibromyalgia -  Abdominal  (+) + obese  Peds negative pediatric ROS (+)  Hematology negative hematology ROS (+)   Anesthesia Other Findings Mandible surgery @ 47yo  Mounjaro LD: a few months   Reproductive/Obstetrics Ovarian mass                              Anesthesia Physical Anesthesia Plan  ASA: 3  Anesthesia Plan: General   Post-op Pain Management: Tylenol PO (pre-op)*, Ketamine IV*, Dilaudid IV and Lidocaine infusion*   Induction: Intravenous  PONV Risk Score and Plan: 4 or greater and Ondansetron, Dexamethasone, Midazolam, Scopolamine patch - Pre-op, Diphenhydramine, Treatment may vary due to age or medical condition and Metaclopromide  Airway Management Planned: Oral  ETT  Additional Equipment: None  Intra-op Plan:   Post-operative Plan: Extubation in OR  Informed Consent: I have reviewed the patients History and Physical, chart, labs and discussed the procedure including the risks, benefits and alternatives for the proposed anesthesia with the patient or authorized representative who has indicated his/her understanding and acceptance.     Dental advisory given  Plan Discussed with: CRNA  Anesthesia Plan Comments:         Anesthesia Quick Evaluation

## 2022-04-13 NOTE — Op Note (Signed)
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), significant intra-abdominal adhesions  Operation: Diagnostic laparoscopy with conversion to exploratory laparotomy, lysis of adhesions and enterolysis for approximately 2.5 hours, right salpingo-oophorectomy, resection of two segments of small bowel with side to side reanastomosis, resection of suspected small bowel diverticula, omental biopsy  Surgeon: Jeral Pinch MD  Assistant Surgeon: Joylene John NP  Anesthesia: GET  Urine Output: 450 cc  Operative Findings: On laparoscopy, significant adhesions of the omentum and multiple loops of small bowel to the anterior abdominal wall along the midline and in the right mid abdomen and pelvis. On laparotomy, multiple loops of small bowel adherent to the anterior abdominal wall along the midline and within the right mid and lower abdomen. Dense adhesions in the areas of prior incisions with numerous loops of small bowel plastered to the anterior abdominal wall.  Omentum with some adhesions to the anterior abdominal wall.  During extensive lysis of adhesions and enterolysis, 2 full-thickness avoidable small bowel enterotomies were noted as well as some deserosalization of approximately 2 cm of terminal ileum.  No ascites, no peritoneal findings.  Sigmoid colon with adhesions to the left lateral pelvic sidewall.  Right ovary approximately 3 cm with small, cystic appearing lesion.  Otherwise normal in appearance. 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  Estimated Blood Loss:  250 cc      Total IV Fluids: see I&O flowsheet         Specimens: right tube and ovary, small bowel segments, omental biopsy         Complications:  None apparent; patient tolerated the procedure well.         Disposition: PACU - hemodynamically stable.  Procedure Details  The  patient was seen in the Holding Room. The risks, benefits, complications, treatment options, and expected outcomes were discussed with the patient.  The patient concurred with the proposed plan, giving informed consent.  The site of surgery properly noted/marked. The patient was identified as Christine Grant and the procedure verified as a open versus robotic RSO, possible staging.   After induction of anesthesia, the patient was draped and prepped in the usual sterile manner. Patient was placed in supine position after anesthesia and draped and prepped in the usual sterile manner as follows: Her arms were tucked to her side with all appropriate precautions.  The patient was secured to the bed using padding and tape across her chest.  The patient was placed in the semi-lithotomy position in [place].  The perineum and vagina were prepped with Betadine. The urethra was prepped with Betadine. Foley catheter was placed. The patient's abdomen was prepped with ChloraPrep and she was draped after the prep had been allowed to dry for 3 minutes.  A Time Out was held and the above information confirmed.  OG tube placement was confirmed and to suction.   Next, a 5 mm skin incision was made 1 cm below the subcostal margin in the midclavicular line.  The 5 mm Optiview port and scope was used for direct entry.  Opening pressure was under 10 mm CO2.  The abdomen was insufflated and the findings were noted as above.   At this point and all points during the procedure, the patient's intra-abdominal pressure did not exceed 15 mmHg. Next, two 5 mm incisions were placed in the mid left abdomen and trocars were placed under direct  visualization. Given intra-abdominal findings, decision made to proceed with open procedure.    A midline laparotomy was then performed with a scalpel and taken down to the fascia with monopolar electrocautery.  The fascia was incised with a scalpel and an area identified not to contain small  bowel adhesions on laparoscopy.  Ultimately, the peritoneum was entered sharply.  The incision was extended somewhat inferiorly and superiorly although during this extension, multiple loops of small bowel had to be lysed from their dense adhesions to the anterior abdominal wall, especially on the right aspect of the incision.  Meticulous dissection was then performed for approximately 2-1/2 hours, ultimately mobilizing all of the small bowel from the cecum to the ligament of Treitz.  This involved sharp dissection to lyse dense adhesions to the anterior abdominal wall with multiple loops of bowel as well as to lyse adhesions between loops of small bowel.  During dissection of several loops of small bowel from their attachment to the anterior abdominal wall, there were 2 unavoidable small bowel enterotomies within the mid-distal ileum.  These were both tagged and closed with interrupted 2-0 Vicryl suture.  Once completely mobilized, and all adhesions lysed between loops of small bowel, small bowel was run from the cecum to the ligament of Treitz multiple times.  Along the ileum, a 2 cm small bowel diverticula was noted.  The filter was then set up and the small bowel packed into the upper abdomen.  Pelvic washings were collected to be sent to pathology.  The right ovary was identified and grasped with a Babcock.  The right peritoneum was incised lateral to the IP ligament to open up the retroperitoneal space.  The ureter was identified along the medial leaf of the broad ligament and an incision within the broad ligament superior to the ureter was made and stretched.  The infundibulopelvic ligament was isolated, clamped, and suture ligated with 2-0 Vicryl x2.  The remaining attachment of the ovary to the peritoneum along the right pelvic sidewall were then lysed with monopolar electrocautery, ensuring the ureter was well inferior to this dissection.  The right ovary was sent for frozen section.  The pelvis was  irrigated and excellent hemostasis noted.  The small bowel was then unpacked and attention was turned to the ileum again.  The two areas where there had been an enterotomy in very close proximity to each other within the mid ileum.  The segment of ileum was selected to excise both of these enterotomy locations. A window was created at the junction of the bowel wall and mesentery. The bowel stapler was passed through the window and and the 73mm GIA stapler was deployed and fired. At the distal margin a similar process took place. The mesentery was scored then sealed and transected with ligasure to free up the segment of ileum.  The colon distal limits of the small bowel were lined up side-by-side, with antimesenteric bowel to antimesenteric bowel.  2 stay sutures were placed with 2-0 Vicryl to hold the limbs adjacent to each other.  The corners of the bowel ends were opened. The GIA stapler was passed into these limbs, deployed and fired to create a stapled functional side-to-side anastamosis. The opening of the two limbs was reapproximated with allis clamps. A 78mm TA stapler was used to seal the top of the anastamosis with a double layer of staples.  The staple line was oversewn with interrupted 2-0 Vicryl.  The mesenteric window was closed with running 2-0 Vicryl.  Along the distal ileum, approximately 20-25 cm from the terminal ileum, a 1-2 cm area of deserosalized small bowel was noted, possibly with some injury of the muscularis.  Decision was made to resect this segment of small bowel as well.  The segment of ileum was selected. A window was created at the junction of the bowel wall and mesentery. The bowel stapler was passed through the window and and the 35mm GIA stapler was deployed and fired. At the distal margin a similar process took place. The mesentery was scored then sealed and transected with ligasure to free up the segment of ileum.  The colon distal limits of the small bowel were lined up  side-by-side, with antimesenteric bowel to antimesenteric bowel.  2 stay sutures were placed with 2-0 Vicryl to hold the limbs adjacent to each other.  The corners of the bowel ends were opened. The GIA stapler was passed into these limbs, deployed and fired to create a stapled functional side-to-side anastamosis. The opening of the two limbs was reapproximated with allis clamps. A 69mm TA stapler was used to seal the top of the anastamosis with a double layer of staples.  The staple line was oversewn with interrupted 2-0 Vicryl.  The mesenteric window was closed with running 2-0 Vicryl.    Attention was turned to the small bowel diverticula.  The small bowel mesentery around the diverticula was skeletonized and a GIA 60 mm load was used to staple across the base of the diverticula.  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  An omental biopsy was taken using the LigaSure device.  The incision was then closed with running #1 looped PDS tied in the midline. The subcutaneous tissue was irrigated and hemostasis achieved. Exparel was injected for local anesthesia. The deeper subcutaneous tissue was closed with 2-0 Vicyrl in running fashion. The skin was closed with staples and 1/2 in packing tape was placed (along with some irrigation) between staples in four different locations along the length of the incision. The incision was then covered by a honeycomb dressing.  The subcuticular tissue of all laparoscopic incisions was closed with 4-0 Vicryl and the skin was closed with 4-0 Monocryl in a subcuticular manner.  Dermabond was applied.    All sponge, lap and needle counts were correct x  3.   The patient was transferred to the recovery room in stable condition.  Jeral Pinch, MD

## 2022-04-13 NOTE — Interval H&P Note (Signed)
History and Physical Interval Note:  May 11, 2022 6:43 AM  Christine Grant  has presented today for surgery, with the diagnosis of ovarian mass.  The various methods of treatment have been discussed with the patient and family. After consideration of risks, benefits and other options for treatment, the patient has consented to  Procedure(s): LAPAROSCOPY DIAGNOSTIC (N/A) XI ROBOTIC ASSISTED RIGHT SALPINGO OOPHORECTOMY, possible staging, possible laparotomy (Right) as a surgical intervention.  The patient's history has been reviewed, patient examined, no change in status, stable for surgery.  I have reviewed the patient's chart and labs.  Questions were answered to the patient's satisfaction.     Christine Grant

## 2022-04-13 NOTE — Anesthesia Postprocedure Evaluation (Signed)
Anesthesia Post Note  Patient: Christine Grant  Procedure(s) Performed: LAPAROSCOPY DIAGNOSTIC EXCISION OF ADNEXAL MASS (Right) LYSIS OF ADHESION SMALL BOWEL RESECTION     Patient location during evaluation: PACU Anesthesia Type: General Level of consciousness: awake and alert, oriented and patient cooperative Pain management: pain level controlled Vital Signs Assessment: post-procedure vital signs reviewed and stable Respiratory status: spontaneous breathing, nonlabored ventilation and respiratory function stable Cardiovascular status: blood pressure returned to baseline and stable Postop Assessment: no apparent nausea or vomiting Anesthetic complications: no   No notable events documented.  Last Vitals:  Vitals:   04/13/22 1345 04/13/22 1400  BP: (!) 146/86 (!) 147/81  Pulse: (!) 107 (!) 110  Resp: 15 14  Temp:    SpO2: 94% 95%    Last Pain:  Vitals:   04/13/22 1400  TempSrc:   PainSc: [place]

## 2022-04-13 NOTE — Brief Op Note (Signed)
04/13/2022  1:00 PM  PATIENT:  Christine Grant  47 y.o. female  PRE-OPERATIVE DIAGNOSIS:  ovarian mass  POST-OPERATIVE DIAGNOSIS:  ovarian mass  PROCEDURE:  Procedure(s): LAPAROSCOPY DIAGNOSTIC (N/A) EXCISION OF ADNEXAL MASS (Right) LYSIS OF ADHESION SMALL BOWEL RESECTION  SURGEON:  Surgeon(s) and Role:    Lafonda Mosses, MD - Primary  ASSISTANTS: Joylene John, NP   ANESTHESIA:   general  EBL:  250 mL   BLOOD ADMINISTERED:none  DRAINS: none   LOCAL MEDICATIONS USED:  MARCAINE , exparel    SPECIMEN:  right tube and ovary, pelvic washings, omental biopsy, multiple segments of small bowel  DISPOSITION OF SPECIMEN:  PATHOLOGY  COUNTS:  YES  TOURNIQUET:  * No tourniquets in log *  DICTATION: .Note written in EPIC  PLAN OF CARE: Admit to inpatient   PATIENT DISPOSITION:  PACU - hemodynamically stable.   Delay start of Pharmacological VTE agent (>24hrs) due to surgical blood loss or risk of bleeding: not applicable

## 2022-04-13 NOTE — Anesthesia Procedure Notes (Signed)
Procedure Name: Intubation Date/Time: 04/13/2022 8:37 AM  Performed by: Niel Hummer, CRNAPre-anesthesia Checklist: Patient identified, Emergency Drugs available, Suction available and Patient being monitored Patient Re-evaluated:Patient Re-evaluated prior to induction Oxygen Delivery Method: Circle system utilized Preoxygenation: Pre-oxygenation with 100% oxygen Induction Type: IV induction Ventilation: Mask ventilation without difficulty Laryngoscope Size: Mac and 4 Grade View: Grade I Tube type: Oral Tube size: 7.0 mm Number of attempts: 1 Airway Equipment and Method: Stylet Placement Confirmation: ETT inserted through vocal cords under direct vision, positive ETCO2 and breath sounds checked- equal and bilateral Secured at: 22 cm Tube secured with: Tape Dental Injury: Teeth and Oropharynx as per pre-operative assessment

## 2022-04-14 ENCOUNTER — Encounter (HOSPITAL_COMMUNITY): Payer: Self-pay | Admitting: Gynecologic Oncology

## 2022-04-14 LAB — CBC
HCT: 45.3 % (ref 36.0–46.0)
HCT: 46.2 % — ABNORMAL HIGH (ref 36.0–46.0)
Hemoglobin: 14.7 g/dL (ref 12.0–15.0)
Hemoglobin: 14.7 g/dL (ref 12.0–15.0)
MCH: 27.6 pg (ref 26.0–34.0)
MCH: 27.8 pg (ref 26.0–34.0)
MCHC: 32.5 g/dL (ref 30.0–36.0)
MCHC: 31.8 g/dL (ref 30.0–36.0)
MCV: 85 fL (ref 80.0–100.0)
MCV: 87.3 fL (ref 80.0–100.0)
Platelets: 263 10*3/uL (ref 150–400)
Platelets: 280 10*3/uL (ref 150–400)
RBC: 5.33 MIL/uL — ABNORMAL HIGH (ref 3.87–5.11)
RBC: 5.29 MIL/uL — ABNORMAL HIGH (ref 3.87–5.11)
RDW: 14.6 % (ref 11.5–15.5)
RDW: 14.6 % (ref 11.5–15.5)
WBC: 14.6 10*3/uL — ABNORMAL HIGH (ref 4.0–10.5)
WBC: 13.4 10*3/uL — ABNORMAL HIGH (ref 4.0–10.5)
nRBC: 0 % (ref 0.0–0.2)
nRBC: 0 % (ref 0.0–0.2)

## 2022-04-14 LAB — COMPREHENSIVE METABOLIC PANEL
ALT: 24 U/L (ref 0–44)
AST: 16 U/L (ref 15–41)
Albumin: 3.5 g/dL (ref 3.5–5.0)
Alkaline Phosphatase: 50 U/L (ref 38–126)
Anion gap: 10 (ref 5–15)
BUN: 17 mg/dL (ref 6–20)
CO2: 25 mmol/L (ref 22–32)
Calcium: 8.4 mg/dL — ABNORMAL LOW (ref 8.9–10.3)
Chloride: 99 mmol/L (ref 98–111)
Creatinine, Ser: 0.84 mg/dL (ref 0.44–1.00)
GFR, Estimated: >60 mL/min (ref >60)
Glucose, Bld: 235 mg/dL — ABNORMAL HIGH (ref 70–99)
Potassium: 5 mmol/L (ref 3.5–5.1)
Sodium: 134 mmol/L — ABNORMAL LOW (ref 135–145)
Total Bilirubin: 1 mg/dL (ref 0.3–1.2)
Total Protein: 6.5 g/dL (ref 6.5–8.1)

## 2022-04-14 LAB — CYTOLOGY - NON PAP

## 2022-04-14 LAB — GLUCOSE, CAPILLARY
Glucose-Capillary: 233 mg/dL — ABNORMAL HIGH (ref 70–99)
Glucose-Capillary: 242 mg/dL — ABNORMAL HIGH (ref 70–99)
Glucose-Capillary: 216 mg/dL — ABNORMAL HIGH (ref 70–99)
Glucose-Capillary: 187 mg/dL — ABNORMAL HIGH (ref 70–99)

## 2022-04-14 LAB — LACTIC ACID, PLASMA: Lactic Acid, Venous: 1.5 mmol/L (ref 0.5–1.9)

## 2022-04-14 MED ORDER — METOPROLOL SUCCINATE ER 25 MG PO TB24
25.0000 mg | ORAL_TABLET | Freq: Every day | ORAL | Status: DC
  Administered 2022-04-14 – 2022-04-17 (×4): 25 mg via ORAL
  Filled 2022-04-14 (×4): qty 1

## 2022-04-14 MED ORDER — SODIUM CHLORIDE 0.9 % IV BOLUS
500.0000 mL | Freq: Once | INTRAVENOUS | Status: AC
  Administered 2022-04-14: 500 mL via INTRAVENOUS

## 2022-04-14 MED ORDER — CHLORHEXIDINE GLUCONATE CLOTH 2 % EX PADS: 6.0000 | MEDICATED_PAD | Freq: Every day | CUTANEOUS | Status: DC

## 2022-04-14 MED ORDER — INSULIN GLARGINE-YFGN 100 UNIT/ML ~~LOC~~ SOLN
15.0000 [IU] | Freq: Every day | SUBCUTANEOUS | Status: DC
  Administered 2022-04-14 – 2022-04-17 (×4): 15 [IU] via SUBCUTANEOUS
  Filled 2022-04-14 (×4): qty 0.15

## 2022-04-14 NOTE — TOC CM/SW Note (Signed)
Transition of Care (TOC) Screening Note  Patient Details  Name: Donnesha Pincock Date of Birth: 1975-08-22  Transition of Care [facility]) CM/SW Contact:    Sherie Don, LCSW Phone Number: 04/14/2022, 10:25 AM  Transition of Care Department [facility]) has reviewed patient and no TOC needs have been identified at this time. We will continue to monitor patient advancement through interdisciplinary progression rounds. If new patient transition needs arise, please place a TOC consult.

## 2022-04-14 NOTE — Inpatient Diabetes Management (Signed)
Inpatient Diabetes Program Recommendations  AACE/ADA: New Consensus Statement on Inpatient Glycemic Control (2015)  Target Ranges:  Prepandial:   less than 140 mg/dL      Peak postprandial:   less than 180 mg/dL (1-2 hours)      Critically ill patients:  140 - 180 mg/dL   Lab Results  Component Value Date   GLUCAP 233 (H) 04/14/2022   HGBA1C 9.9 (H) 04/01/2022    Latest Reference Range & Units 04/13/22 06:53 04/13/22 13:07 04/13/22 16:34 04/13/22 21:42 04/14/22 07:41  Glucose-Capillary 70 - 99 mg/dL 140 (H) 245 (H) 232 (H) 252 (H) 233 (H)  (H): Data is abnormally high Review of Glycemic Control  Diabetes history: type 2 Outpatient Diabetes medications: Lantus 30 units BID, Farxiga 10 mg daily, Mounjaro 7.5 mg weekly Current orders for Inpatient glycemic control: Novolog 0-15 units correction scale TID  Inpatient Diabetes Program Recommendations:   Received diabetes coordinator consult. Noted that blood sugars have been in the 200's. Did receive Decadron 4 mg yesterday in surgery which may be causing CBGs to be elevated.   Recommend adding Semglee 15 units daily if blood sugars continue to be greater than 180 mg/dl. Continue Novolog correction scale as ordered.   Harvel Ricks RN BSN CDE Diabetes Coordinator Pager: [phone]  8am-5pm

## 2022-04-14 NOTE — Progress Notes (Signed)
Mobility Specialist - Progress Note   04/14/22 0939  Oxygen Therapy  SpO2 (!) 88 %  O2 Device Nasal Cannula  O2 Flow Rate (L/min) 2 L/min  Patient Activity (if Appropriate) Ambulating  Mobility  Activity Ambulated with assistance in hallway  Level of Assistance Modified independent, requires aide device or extra time  Assistive Device Other (Comment) (IV Pole)  Distance Ambulated (ft) 230 ft  Activity Response Tolerated well  Mobility Referral Yes  $Mobility charge 1 Mobility   Pt received in bed and agreeable to mobility. Pt HR up 145bpm during ambulation. Pt states she's had a high HR since she was young. No complaints during session. Pt to recliner after session with all needs met.    Pre-mobility: 129 HR, 94% SpO2 (2L [place]) During mobility: 145 HR, 88% SpO2 (2L [place]) Post-mobility: 140 HR, 90% SPO2 (2L [place])  Set designer

## 2022-04-14 NOTE — Progress Notes (Signed)
Pt refused CHG bath this am, also initially refused mobility, stated "my doctor said to wait a few hours before I walk . . " Reviewed complications of bedrest w pt and strongly encouraged her to be OOB as much as possible.Mobility specialist to work w pt today.

## 2022-04-14 NOTE — Progress Notes (Signed)
1 Day Post-Op Procedure(s) (LRB): LAPAROSCOPY DIAGNOSTIC (N/A) EXCISION OF ADNEXAL MASS (Right) LYSIS OF ADHESION SMALL BOWEL RESECTION  Subjective: Patient reports hanging in there this am. Passing small amount of flatus. Had dilaudid at 4am then had oxy at 5:30am. Rotating oxycodone and dilaudid is working well for her pain relief. No chest pain, dyspnea. Mild nausea and states this can be normal for her. She ambulated in the halls last pm.  Objective: Vital signs in last 24 hours: Temp:  [97.4 F (36.3 C)-99.6 F (37.6 C)] 98.8 F (37.1 C) (04/04 0346) Pulse Rate:  [107-127] 127 (04/04 0346) Resp:  [14-30] 18 (04/04 0346) BP: (123-150)/(63-92) 124/75 (04/04 0346) SpO2:  [91 %-99 %] 94 % (04/04 0346) Weight:  [254 lb 9.6 oz (115.5 kg)] 254 lb 9.6 oz (115.5 kg) (04/03 1500)    Intake/Output from previous day: 04/03 0701 - 04/04 0700 In: 4463.5 [P.O.:840; I.V.:3043.1; IV Piggyback:580.4] Out: 3500 [Urine:3250; Blood:250]  Physical Examination: General: alert, cooperative, no distress, and resting comfortably in bed Resp: clear to auscultation bilaterally Cardio: tachycardic at 129 bpm GI: incision: midline abdominal incision with stained op site dressing, incision wicks underneath in between staples on incision, lap sites to the abdomen with dermabond intact and abdomen soft, obese, mildly hypoactive bowel sounds Extremities: extremities normal, atraumatic, no cyanosis or edema O2 sat 93-94 on 2 L O2 Foley in place with clear, yellow urine  Labs: WBC/Hgb/Hct/Plts:  14.6/14.7/45.3/263 (04/04 0443) BUN/Cr/glu/ALT/AST/amyl/lip:  17/0.84/--/24/16/--/-- (04/04 [id number])  Assessment: 47 y.o. s/p Procedure(s): LAPAROSCOPY DIAGNOSTIC, EXCISION OF ADNEXAL MASS, LYSIS OF ADHESION, SMALL BOWEL RESECTION: stable Pain:  Pain is well-controlled on PRN medications.  Heme: Hgb 14.7 and Hct 45.3 this am. Plan for repeat later this am to assess for stability.   ID: WBC 14.6- given decadron  intra-op. Felt to be reactive. No sign of infection at this time. Lactic acid pending   CV: Tachycardic. Had EKG-sinus tachy otherwise normal. Hx mild tachycardia preop. Plan to monitor. If persists, may consider a CT angio to rule out PE given HR and O2 levels.  GI:  Tolerating po: Yes. Antiemetics ordered if needed.  GU: Creatinine 0.84. Adequate output recorded.    FEN: No critical values.   Endo: Diabetes mellitus Type II, under fair control.  CBG: currently on sliding scale. Pending consultation from diabetes coordinator for med assist.  Prophylaxis: SCD and lovenox.  Plan: Repeat CBC Lactic acid pending Encourage ambulation Plan for remove of foley later today Pending diabetes coordinator consult Continue plan of care per Dr. Berline Lopes If tachycardia persists with lower O2 sats, then may consider CT angio to rule out PE   LOS: 1 day    Deanne Bedgood D Dyke Weible 04/14/2022, 7:08 AM

## 2022-04-15 ENCOUNTER — Inpatient Hospital Stay (HOSPITAL_COMMUNITY): Payer: Managed Care, Other (non HMO)

## 2022-04-15 LAB — BASIC METABOLIC PANEL
Anion gap: 8 (ref 5–15)
BUN: 13 mg/dL (ref 6–20)
CO2: 22 mmol/L (ref 22–32)
Calcium: 7.9 mg/dL — ABNORMAL LOW (ref 8.9–10.3)
Chloride: 102 mmol/L (ref 98–111)
Creatinine, Ser: 0.64 mg/dL (ref 0.44–1.00)
GFR, Estimated: >60 mL/min (ref >60)
Glucose, Bld: 248 mg/dL — ABNORMAL HIGH (ref 70–99)
Potassium: 4.1 mmol/L (ref 3.5–5.1)
Sodium: 132 mmol/L — ABNORMAL LOW (ref 135–145)

## 2022-04-15 LAB — CBC
HCT: 39.8 % (ref 36.0–46.0)
Hemoglobin: 12.7 g/dL (ref 12.0–15.0)
MCH: 27.5 pg (ref 26.0–34.0)
MCHC: 31.9 g/dL (ref 30.0–36.0)
MCV: 86.3 fL (ref 80.0–100.0)
Platelets: 237 10*3/uL (ref 150–400)
RBC: 4.61 MIL/uL (ref 3.87–5.11)
RDW: 14.1 % (ref 11.5–15.5)
WBC: 12.6 10*3/uL — ABNORMAL HIGH (ref 4.0–10.5)
nRBC: 0 % (ref 0.0–0.2)

## 2022-04-15 LAB — GLUCOSE, CAPILLARY
Glucose-Capillary: 203 mg/dL — ABNORMAL HIGH (ref 70–99)
Glucose-Capillary: 218 mg/dL — ABNORMAL HIGH (ref 70–99)
Glucose-Capillary: 183 mg/dL — ABNORMAL HIGH (ref 70–99)
Glucose-Capillary: 162 mg/dL — ABNORMAL HIGH (ref 70–99)

## 2022-04-15 LAB — LACTIC ACID, PLASMA: Lactic Acid, Venous: 0.9 mmol/L (ref 0.5–1.9)

## 2022-04-15 LAB — SURGICAL PATHOLOGY

## 2022-04-15 MED ORDER — IOHEXOL 350 MG/ML SOLN
75.0000 mL | Freq: Once | INTRAVENOUS | Status: AC | PRN
  Administered 2022-04-15: 75 mL via INTRAVENOUS

## 2022-04-15 MED ORDER — SODIUM CHLORIDE (PF) 0.9 % IJ SOLN
INTRAMUSCULAR | Status: AC
  Filled 2022-04-15: qty 100

## 2022-04-15 MED ORDER — SIMETHICONE 80 MG PO CHEW
80.0000 mg | CHEWABLE_TABLET | Freq: Four times a day (QID) | ORAL | Status: DC
  Administered 2022-04-15 – 2022-04-17 (×8): 80 mg via ORAL
  Filled 2022-04-15 (×8): qty 1

## 2022-04-15 NOTE — Progress Notes (Signed)
Gynecologic Oncology Progress Note    2 Days Post-Op Procedure(s) (LRB): LAPAROSCOPY DIAGNOSTIC (N/A) EXCISION OF ADNEXAL MASS (Right) LYSIS OF ADHESION SMALL BOWEL RESECTION  Subjective: Patient reports doing ok this am. Passing flatus. Tolerating diet. Reports having breakthrough pain last pm. Wore her SCDs last pm. Voiding without difficulty. Ambulating to the bathroom. No concerns voiced. No dyspnea reported.  Objective: Vital signs in last 24 hours: Temp:  [98.2 F (36.8 C)-100.1 F (37.8 C)] 99.6 F (37.6 C) (04/05 0515) Pulse Rate:  [99-138] 109 (04/05 0515) Resp:  [18] 18 (04/05 0515) BP: (99-125)/(62-76) 119/70 (04/05 0515) SpO2:  [88 %-96 %] 89 % (04/05 0515)    Intake/Output from previous day: 04/04 0701 - 04/05 0700 In: 2614.2 [P.O.:1080; I.V.:1534.2] Out: 2400 [Urine:2400]  Physical Examination: General: alert, cooperative, no distress, and resting comfortably in bed Resp: clear to auscultation bilaterally Cardio: tachycardic at 132 bpm GI: incision: midline abdominal incision with dry dressing- incision wicks underneath in between staples on incision, lap sites to the abdomen with dermabond intact and abdomen soft, obese, mildly hypoactive bowel sounds Extremities: extremities normal, atraumatic, no cyanosis or edema O2 sat 87% on RA  Labs: WBC/Hgb/Hct/Plts:  12.6/12.7/39.8/237 (04/05 0448) BUN/Cr/glu/ALT/AST/amyl/lip:  13/0.64/--/--/--/--/-- (04/05 0448)  Assessment: 47 y.o. s/p Procedure(s): LAPAROSCOPY DIAGNOSTIC, EXCISION OF ADNEXAL MASS, LYSIS OF ADHESION, SMALL BOWEL RESECTION: stable Pain:  Pain is well-controlled on PRN medications.  Heme: Hgb 12.7 from 14.7 and Hct 39.8 from 45.3 04/14/22 am.   ID: WBC 12.6 from 14.6- given decadron intra-op. Felt to be reactive. No sign of infection at this time. Lactic acid 0.9 this am, 1.5 yesterday  CV: Tachycardic. Had EKG-sinus tachy otherwise normal. Hx mild tachycardia preop. Plan for chest xray this am,  possible CT angio   GI:  Tolerating po: Yes. Antiemetics ordered if needed.  GU: Creatinine 0.64 this am from 0.84. Adequate output recorded.    FEN: No critical values.   Endo: Diabetes mellitus Type II, under fair control.  CBG: currently on sliding scale. Appreciate Diabetes Coordinator consultation. Added Semglee 04/14/22. CBG (last 3)  Recent Labs    04/14/22 1549 04/14/22 2136 04/15/22 0802  GLUCAP 216* 187* 203*     Prophylaxis: SCD and lovenox.  Plan: Advance to soft diet Chest 2 view ordered Encourage ambulation Encourage IS use frequently Continue plan of care per Dr. Pricilla Holm If tachycardia persists with lower O2 sats, then may consider CT angio to rule out PE   LOS: 2 days    Christine Grant 04/15/2022, 9:34 AM

## 2022-04-15 NOTE — Plan of Care (Signed)
  Problem: Coping: Goal: Ability to adjust to condition or change in health will improve Outcome: Progressing   Problem: Pain Managment: Goal: General experience of comfort will improve Outcome: Progressing   

## 2022-04-15 NOTE — Progress Notes (Signed)
GYN ONC Progress Note  Patient alert, oriented, in no acute distress, resting in bed. States she has ambulated, sat in the chair, using IS. Voiding without difficulty. Having intermittent gas pains and passing flatus as well. Tolerating diet with no nausea or emesis. No respiratory symptoms reported.   Alert, oriented, in no acute distress. Lungs clear, diminished in the bases. Heart tachycardic with no murmurs or extra beats noted. Abdomen soft, active bowel sounds. SCDs off at this time.  O2 at 90-92, HR 122.  Update: Dr. Pricilla Holm updated on patient's current situation. Given persistent low oxygen saturations, persistent tachycardia, post-operative state, decreased mobility, plan for CT angio per Dr. Pricilla Holm to rule out PE. Attempted to call patient on room phone and cell phone but unable to reach. Will have RN notify patient of the plan. This was discussed as a possibility earlier this am.

## 2022-04-15 NOTE — Progress Notes (Signed)
Mobility Specialist - Progress Note   04/15/22 1256  Mobility  Activity Ambulated with assistance in hallway  Level of Assistance Modified independent, requires aide device or extra time  Assistive Device Other (Comment) (HHA)  Distance Ambulated (ft) 250 ft  Activity Response Tolerated well  Mobility Referral Yes  $Mobility charge 1 Mobility   Pt received in bed and agreeable to mobility. Prior to ambulating, requested to use bathroom. Pt used hallway rails during ambulation for support. No complaints during session. Pt to recliner after session w/ all needs met.   [facility]

## 2022-04-16 LAB — BASIC METABOLIC PANEL
Anion gap: 9 (ref 5–15)
Anion gap: 9 (ref 5–15)
BUN: 13 mg/dL (ref 6–20)
BUN: 11 mg/dL (ref 6–20)
CO2: 22 mmol/L (ref 22–32)
CO2: 24 mmol/L (ref 22–32)
Calcium: 8 mg/dL — ABNORMAL LOW (ref 8.9–10.3)
Calcium: 8.1 mg/dL — ABNORMAL LOW (ref 8.9–10.3)
Chloride: 98 mmol/L (ref 98–111)
Chloride: 99 mmol/L (ref 98–111)
Creatinine, Ser: 0.57 mg/dL (ref 0.44–1.00)
Creatinine, Ser: 0.53 mg/dL (ref 0.44–1.00)
GFR, Estimated: >60 mL/min (ref >60)
GFR, Estimated: >60 mL/min (ref >60)
Glucose, Bld: 230 mg/dL — ABNORMAL HIGH (ref 70–99)
Glucose, Bld: 226 mg/dL — ABNORMAL HIGH (ref 70–99)
Potassium: 3.8 mmol/L (ref 3.5–5.1)
Potassium: 4 mmol/L (ref 3.5–5.1)
Sodium: 129 mmol/L — ABNORMAL LOW (ref 135–145)
Sodium: 132 mmol/L — ABNORMAL LOW (ref 135–145)

## 2022-04-16 LAB — CBC
HCT: 38.5 % (ref 36.0–46.0)
Hemoglobin: 12.5 g/dL (ref 12.0–15.0)
MCH: 27.8 pg (ref 26.0–34.0)
MCHC: 32.5 g/dL (ref 30.0–36.0)
MCV: 85.6 fL (ref 80.0–100.0)
Platelets: 237 10*3/uL (ref 150–400)
RBC: 4.5 MIL/uL (ref 3.87–5.11)
RDW: 13.4 % (ref 11.5–15.5)
WBC: 15.5 10*3/uL — ABNORMAL HIGH (ref 4.0–10.5)
nRBC: 0 % (ref 0.0–0.2)

## 2022-04-16 LAB — GLUCOSE, CAPILLARY
Glucose-Capillary: 214 mg/dL — ABNORMAL HIGH (ref 70–99)
Glucose-Capillary: 241 mg/dL — ABNORMAL HIGH (ref 70–99)
Glucose-Capillary: 289 mg/dL — ABNORMAL HIGH (ref 70–99)
Glucose-Capillary: 184 mg/dL — ABNORMAL HIGH (ref 70–99)

## 2022-04-16 MED ORDER — INSULIN ASPART 100 UNIT/ML IJ SOLN
0.0000 [IU] | Freq: Three times a day (TID) | INTRAMUSCULAR | Status: DC
  Administered 2022-04-16: 11 [IU] via SUBCUTANEOUS
  Administered 2022-04-16 – 2022-04-17 (×3): 7 [IU] via SUBCUTANEOUS
  Administered 2022-04-17: 4 [IU] via SUBCUTANEOUS

## 2022-04-16 MED ORDER — PANTOPRAZOLE SODIUM 40 MG PO TBEC
40.0000 mg | DELAYED_RELEASE_TABLET | Freq: Every day | ORAL | Status: DC
  Administered 2022-04-16: 40 mg via ORAL
  Filled 2022-04-16: qty 1

## 2022-04-16 MED ORDER — SODIUM CHLORIDE 0.9 % IV BOLUS
500.0000 mL | Freq: Once | INTRAVENOUS | Status: AC
  Administered 2022-04-16: 500 mL via INTRAVENOUS

## 2022-04-16 MED ORDER — METOPROLOL TARTRATE 25 MG PO TABS
25.0000 mg | ORAL_TABLET | Freq: Once | ORAL | Status: AC
  Administered 2022-04-16: 25 mg via ORAL
  Filled 2022-04-16: qty 1

## 2022-04-16 NOTE — Progress Notes (Signed)
Gynecologic Oncology Progress Note    3 Days Post-Op Procedure(s) (LRB): LAPAROSCOPY DIAGNOSTIC (N/A) EXCISION OF ADNEXAL MASS (Right) LYSIS OF ADHESION SMALL BOWEL RESECTION  Subjective: Patient reports she's overall doing well today. Still having some pain; occasional IV dilaudid. Feels that the oxy is working. Reports flatus. Tolerating diet. Reports she has ambulated some in hall and to bathroom. Has sat up in chair. No nausea or vomiting. Reports she was replaced on O2 overnight but denies dyspnea.   Objective: Vital signs in last 24 hours: Temp:  [97.5 F (36.4 C)-99.3 F (37.4 C)] 98.7 F (37.1 C) (04/06 1048) Pulse Rate:  [108-139] 108 (04/06 1048) Resp:  [14-16] 14 (04/06 1048) BP: (123-157)/(73-82) 123/76 (04/06 1048) SpO2:  [88 %-94 %] 91 % (04/06 1048)    Intake/Output from previous day: 04/05 0701 - 04/06 0700 In: 990 [P.O.:960; I.V.:30] Out: 800 [Urine:800]  Physical Examination: General: alert, cooperative, no distress, and resting comfortably in bed Resp: clear to auscultation bilaterally Cardio: tachycardic, regular rhythm GI: incision: midline abdominal incision with dry dressing- incision wicks underneath in between staples on incision, lap sites to the abdomen with dermabond intact and abdomen soft, obese, mildly hypoactive bowel sounds Extremities: extremities normal, atraumatic, no cyanosis or edema  Labs: WBC/Hgb/Hct/Plts:  15.5/12.5/38.5/237 (04/06 0525) BUN/Cr/glu/ALT/AST/amyl/lip:  13/0.57/--/--/--/--/-- (04/06 0525)  Assessment: 47 y.o. s/p Procedure(s): LAPAROSCOPY DIAGNOSTIC, EXCISION OF ADNEXAL MASS, LYSIS OF ADHESION, SMALL BOWEL RESECTION: stable Pain:  Pain is overall well-controlled on PRN medications. Will work toward weaning for IV PRN dilaudid. Reassess later today to d/c.  Heme: Hgb stable. No concerns for bleeding.  ID: WBC slightly increased to 15.5. Felt to be reactive. No sign of infection at this time. Afebrile and well  appearing. Will continue to monitor.  CV: Tachycardic. Had EKG-sinus tachy otherwise normal. Hx mild tachycardia preop. CTPE without evidence of PE. Continue with mobilization.  GI:  Tolerating po: Yes. Antiemetics ordered if needed.  GU: Creatinine 0.57. Adequate output recorded.    FEN: Hyponatremia to 129. IVF bolus x1. Will reassess this PM.  Endo: Diabetes mellitus Type II, under fair control.  CBG: currently on sliding scale. Increased to resistant for mealtime insulin. Appreciate Diabetes Coordinator consultation. Added Semglee 04/14/22. CBG (last 3)  Recent Labs    04/15/22 1611 04/15/22 2055 04/16/22 0725  GLUCAP 183* 162* 214*      Prophylaxis: SCD and lovenox.  Plan: Advance to regular diet. Encourage ambulation and OOB to chair. Encourage IS use frequently. Increased sliding scale insulin to resistant for improved control. Reassess BMP this afternoon given hyponatremia following IVF bolus.  Will reassess pain control later today to consider weaning from IV pain meds.     LOS: 3 days    Dannette Kinkaid 04/16/2022, 11:27 AM

## 2022-04-16 NOTE — Progress Notes (Signed)
To Ralene Bathe, MD.Patient has hx of Sinus Tachycardia, HR stays at 120's but it's 139 now.  It Fluctuates.  Takes Metoprolol XL 25 mg daily.  Do you want to give  one time Metoprolol now

## 2022-04-16 NOTE — Progress Notes (Signed)
Received orders to give 25 mg Metoprolol to patient for HR 139.

## 2022-04-17 LAB — BASIC METABOLIC PANEL
Anion gap: 6 (ref 5–15)
BUN: 11 mg/dL (ref 6–20)
CO2: 25 mmol/L (ref 22–32)
Calcium: 7.9 mg/dL — ABNORMAL LOW (ref 8.9–10.3)
Chloride: 101 mmol/L (ref 98–111)
Creatinine, Ser: 0.49 mg/dL (ref 0.44–1.00)
GFR, Estimated: >60 mL/min (ref >60)
Glucose, Bld: 184 mg/dL — ABNORMAL HIGH (ref 70–99)
Potassium: 3.4 mmol/L — ABNORMAL LOW (ref 3.5–5.1)
Sodium: 132 mmol/L — ABNORMAL LOW (ref 135–145)

## 2022-04-17 LAB — CBC
HCT: 38.2 % (ref 36.0–46.0)
Hemoglobin: 12.4 g/dL (ref 12.0–15.0)
MCH: 27.5 pg (ref 26.0–34.0)
MCHC: 32.5 g/dL (ref 30.0–36.0)
MCV: 84.7 fL (ref 80.0–100.0)
Platelets: 245 10*3/uL (ref 150–400)
RBC: 4.51 MIL/uL (ref 3.87–5.11)
RDW: 13.6 % (ref 11.5–15.5)
WBC: 12.2 10*3/uL — ABNORMAL HIGH (ref 4.0–10.5)
nRBC: 0 % (ref 0.0–0.2)

## 2022-04-17 LAB — GLUCOSE, CAPILLARY
Glucose-Capillary: 180 mg/dL — ABNORMAL HIGH (ref 70–99)
Glucose-Capillary: 221 mg/dL — ABNORMAL HIGH (ref 70–99)

## 2022-04-17 MED ORDER — NYSTATIN 100000 UNIT/GM EX CREA: 1.0000 | TOPICAL_CREAM | Freq: Two times a day (BID) | CUTANEOUS | Status: DC | PRN

## 2022-04-17 MED ORDER — APIXABAN 2.5 MG PO TABS: 2.5000 mg | ORAL_TABLET | Freq: Two times a day (BID) | ORAL | 0 refills | Status: DC

## 2022-04-17 MED ORDER — POLYETHYLENE GLYCOL 3350 17 G PO PACK
17.0000 g | PACK | Freq: Every day | ORAL | Status: DC
  Administered 2022-04-17: 17 g via ORAL
  Filled 2022-04-17: qty 1

## 2022-04-17 MED ORDER — SENNA 8.6 MG PO TABS
1.0000 | ORAL_TABLET | Freq: Every day | ORAL | Status: DC
  Administered 2022-04-17: 8.6 mg via ORAL
  Filled 2022-04-17: qty 1

## 2022-04-17 MED ORDER — SENNA 8.6 MG PO TABS
1.0000 | ORAL_TABLET | Freq: Every day | ORAL | 0 refills | Status: DC
Start: 2022-04-17 — End: 2022-05-31

## 2022-04-17 MED ORDER — POLYETHYLENE GLYCOL 3350 17 G PO PACK
17.0000 g | PACK | Freq: Every day | ORAL | 0 refills | Status: DC | PRN
Start: 2022-04-17 — End: 2022-05-31

## 2022-04-17 MED ORDER — OXYCODONE HCL 5 MG PO TABS
5.0000 mg | ORAL_TABLET | ORAL | 0 refills | Status: DC | PRN
Start: 2022-04-17 — End: 2022-04-22

## 2022-04-17 NOTE — Progress Notes (Signed)
Wicks removed X 3, all sites w sm amt of serosanguineous drainage, dry guaze applied to areas. Pt tol well.

## 2022-04-17 NOTE — Discharge Instructions (Signed)
04/17/2022  Return to work: 4-6 weeks if applicable  Activity: 1. Be up and out of the bed during the day.  Take a nap if needed.  You may walk up steps but be careful and use the hand rail.  Stair climbing will tire you more than you think, you may need to stop part way and rest.   2. No lifting or straining over 10 lbs, pushing, pulling, straining for 6 weeks.  3. Do not drive if you are taking narcotic pain medicine. You need to make sure your reaction time has returned and you can brake safely.  4. Shower daily.  Use your regular soap to bathe and when finished pat your incision dry; don't rub.  No tub baths until cleared by your surgeon.   5. You may experience a small amount of clear drainage from your incisions, which is normal.  If the drainage persists or increases, please call the office.  6. Take Tylenol or ibuprofen first for pain and only use narcotic pain medication for severe pain not relieved by the Tylenol or Ibuprofen.  Monitor your Tylenol intake to a max of 4,000 mg.   Diet: 1. Low sodium Heart Healthy Diet is recommended.  2. It is safe to use a laxative, such as Miralax or Colace, if you have difficulty moving your bowels. You can take Sennakot at bedtime every evening to keep bowel movements regular and to prevent constipation.    Wound Care: 1. Keep clean and dry.  Shower daily.  Reasons to call the Doctor: Fever - Oral temperature greater than 100.4 degrees Fahrenheit Foul-smelling vaginal discharge Difficulty urinating Nausea and vomiting Increased pain at the site of the incision that is unrelieved with pain medicine. Difficulty breathing with or without chest pain New calf pain especially if only on one side Sudden, continuing increased vaginal bleeding with or without clots.   Contacts: For questions or concerns you should contact:  Dr. Pricilla Holm at [phone]  Warner Mccreedy, NP at [phone]  After Hours: call [phone] and have the GYN  Oncologist paged/contacted

## 2022-04-17 NOTE — Progress Notes (Signed)
Gynecologic Oncology Progress Note    4 Days Post-Op Procedure(s) (LRB): LAPAROSCOPY DIAGNOSTIC (N/A) EXCISION OF ADNEXAL MASS (Right) LYSIS OF ADHESION SMALL BOWEL RESECTION  Subjective: Patient reports that she had a good day yesterday. Was able to ambulate in the hallway 4 times. Voiding without issue. Continues to have flatus. No BM. Pain overall controlled. Tolerating transition to a regular diet without nausea.  Objective: Vital signs in last 24 hours: Temp:  [97.4 F (36.3 C)-98.7 F (37.1 C)] 98.2 F (36.8 C) (04/07 0855) Pulse Rate:  [108-125] 125 (04/07 0855) Resp:  [14-18] 14 (04/07 0855) BP: (110-133)/(67-76) 133/73 (04/07 0855) SpO2:  [91 %-98 %] 98 % (04/07 0855) Last BM Date : 04/13/22  Intake/Output from previous day: 04/06 0701 - 04/07 0700 In: 1680 [P.O.:1680] Out: 1125 [Urine:1125]  Physical Examination: General: alert, cooperative, no distress, and resting comfortably in bed Resp: clear to auscultation bilaterally Cardio: tachycardic, regular rhythm GI: incision: midline abdominal incision with dry dressing- incision wicks underneath in between staples on incision, lap sites to the abdomen with dermabond intact and abdomen soft, obese, mildly hypoactive bowel sounds Extremities: extremities normal, atraumatic, no cyanosis or edema  Labs: WBC/Hgb/Hct/Plts:  12.2/12.4/38.2/245 (04/07 0422) BUN/Cr/glu/ALT/AST/amyl/lip:  11/0.49/--/--/--/--/-- (04/07 0427)  Assessment: 47 y.o. s/p Procedure(s): LAPAROSCOPY DIAGNOSTIC, EXCISION OF ADNEXAL MASS, LYSIS OF ADHESION, SMALL BOWEL RESECTION: stable Pain:  Pain is well-controlled on PRN medications. Will d/c IV PRN dilaudid.   Heme: Hgb stable. No concerns for bleeding.  ID: WBC improved to 12.2 today. Was likely reactive. No sign of infection at this time. Afebrile and well appearing.   CV: Tachycardic but improved closer to baseline. Had EKG-sinus tachy otherwise normal. Hx mild tachycardia preop. CTPE without  evidence of PE. Continue with mobilization.  GI:  Tolerating po: Yes. Antiemetics ordered if needed. Bowel regimen increased.  GU: Creatinine 0.49. Adequate output recorded.    FEN: Hyponatremia improved 132, consistent with preop.   Endo: Diabetes mellitus Type II, under fair control.  CBG: currently on sliding scale. Increased to resistant for mealtime insulin. Improved control. Appreciate Diabetes Coordinator consultation. Added Semglee 04/14/22. Will resume home meds on discharge CBG (last 3)  Recent Labs    04/16/22 1629 04/16/22 2210 04/17/22 0719  GLUCAP 289* 184* 180*     Prophylaxis: SCD and lovenox.  Plan: Miralax, senna added for bowel regimen. Continue to encourage ambulation and OOB to chair. Encourage IS use frequently. Plan remove wicks from incision today. Will reassess PM for discharge if continuing to do well.    LOS: 4 days    Broadus Costilla 04/17/2022, 10:29 AM

## 2022-04-17 NOTE — Plan of Care (Signed)
  Problem: Education: Goal: Ability to describe self-care measures that may prevent or decrease complications (Diabetes Survival Skills Education) will improve Outcome: Adequate for Discharge Goal: Individualized Educational Video(s) Outcome: Adequate for Discharge   Problem: Coping: Goal: Ability to adjust to condition or change in health will improve Outcome: Adequate for Discharge   Problem: Fluid Volume: Goal: Ability to maintain a balanced intake and output will improve Outcome: Adequate for Discharge   Problem: Health Behavior/Discharge Planning: Goal: Ability to identify and utilize available resources and services will improve Outcome: Adequate for Discharge Goal: Ability to manage health-related needs will improve Outcome: Adequate for Discharge   Problem: Metabolic: Goal: Ability to maintain appropriate glucose levels will improve Outcome: Adequate for Discharge   Problem: Nutritional: Goal: Maintenance of adequate nutrition will improve Outcome: Adequate for Discharge Goal: Progress toward achieving an optimal weight will improve Outcome: Adequate for Discharge   Problem: Skin Integrity: Goal: Risk for impaired skin integrity will decrease Outcome: Adequate for Discharge   Problem: Tissue Perfusion: Goal: Adequacy of tissue perfusion will improve Outcome: Adequate for Discharge   Problem: Education: Goal: Knowledge of the prescribed therapeutic regimen will improve Outcome: Adequate for Discharge Goal: Understanding of sexual limitations or changes related to disease process or condition will improve Outcome: Adequate for Discharge Goal: Individualized Educational Video(s) Outcome: Adequate for Discharge   Problem: Self-Concept: Goal: Communication of feelings regarding changes in body function or appearance will improve Outcome: Adequate for Discharge   Problem: Skin Integrity: Goal: Demonstration of wound healing without infection will improve Outcome:  Adequate for Discharge   Problem: Education: Goal: Knowledge of General Education information will improve Description: Including pain rating scale, medication(s)/side effects and non-pharmacologic comfort measures Outcome: Adequate for Discharge   Problem: Health Behavior/Discharge Planning: Goal: Ability to manage health-related needs will improve Outcome: Adequate for Discharge   Problem: Clinical Measurements: Goal: Ability to maintain clinical measurements within normal limits will improve Outcome: Adequate for Discharge Goal: Will remain free from infection Outcome: Adequate for Discharge Goal: Diagnostic test results will improve Outcome: Adequate for Discharge Goal: Respiratory complications will improve Outcome: Adequate for Discharge Goal: Cardiovascular complication will be avoided Outcome: Adequate for Discharge   Problem: Activity: Goal: Risk for activity intolerance will decrease Outcome: Adequate for Discharge   Problem: Nutrition: Goal: Adequate nutrition will be maintained Outcome: Adequate for Discharge   Problem: Coping: Goal: Level of anxiety will decrease Outcome: Adequate for Discharge   Problem: Elimination: Goal: Will not experience complications related to bowel motility Outcome: Adequate for Discharge Goal: Will not experience complications related to urinary retention Outcome: Adequate for Discharge   Problem: Pain Managment: Goal: General experience of comfort will improve Outcome: Adequate for Discharge   Problem: Safety: Goal: Ability to remain free from injury will improve Outcome: Adequate for Discharge   Problem: Skin Integrity: Goal: Risk for impaired skin integrity will decrease Outcome: Adequate for Discharge   

## 2022-04-17 NOTE — Progress Notes (Signed)
Reviewed written d/c instructions w pt and her husband and all questions answered. They both verbalized understanding. D/C via w/c w all belongings in stable condition. 

## 2022-04-18 ENCOUNTER — Telehealth: Payer: Self-pay

## 2022-04-18 ENCOUNTER — Encounter: Payer: Self-pay | Admitting: Gynecologic Oncology

## 2022-04-18 NOTE — Telephone Encounter (Addendum)
Christine Grant has had nausea since 0800 with it getting progressively worse as the morning progresses.She is feeling light headed. Skin slightly clammy to touch per husband. She has not eating anything since last night. She took 1 Oxycodone at 0300 with no food then took 2 tabs at 1000 with no food. Husband Christine Grant gave her a Zofran 4mg  ODT at ~1200.  She moved her bowels well yesterday after the miralax in the hospital. No BM but passing gas today. Pain level is a 6/10. Pt has been out of bed to use BR. Incisions D&I. Honey comb dressing removed yesterday prior to discharge. Pt afebrile. Told Christine Grant to give Christine Grant another Zofran ODT at 1300.   Called husband back about 1340. Christine Grant sleeping. She has not had any food. Insulin not taken for today with no po intake. BS this am 280. Husband will give her some broth to sip on when she wakes up. Told husband to give Christine Grant a senokot-s this evening along with a capful of Miralax. The Miralax can be repeated in am if no results.   Husband requested a work note be put into epic for him as he stated that he was home last night and will be home this evening.  Told him that Christine Grant will have note in computer later this evening. Christine Grant verbalized understanding.

## 2022-04-18 NOTE — Discharge Summary (Signed)
Physician Discharge Summary  Patient ID: Christine Grant MRN: [id number] DOB/AGE: Nov 17, 1975 47 y.o.  Admit date: 04/13/2022 Discharge date: 04/18/2022  Admission Diagnoses: Ovarian mass  Discharge Diagnoses:  Principal Problem:   Ovarian mass Active Problems:   Intra-abdominal adhesions   Discharged Condition:  The patient is in good condition and stable for discharge.   Hospital Course: On 04/13/2022, the patient underwent the following: Procedure(s): Diagnostic laparoscopy with conversion to exploratory laparotomy, lysis of adhesions and enterolysis for approximately 2.5 hours, right salpingo-oophorectomy, resection of two segments of small bowel with side to side reanastomosis, resection of suspected small bowel diverticula, omental biopsy.  The postoperative course was notable for tachycardia and need for supplemental oxygen. 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 Postoperative tachycardia above baseline and occasional supplemental oxygen need was thought to be due to deconditioning postop, however, CT PE was performed to ensure no PE and this was negative for PE. Pt continued to improve in pain control and mobilization, and her tachycardia returned closer to her baseline and she was successfully weaned to room air. Pt's diabetes was treated with semglee and mealtime insulin which was adjusted during her course with improved control. On discharge she was to resume her home medications.  She was discharged to home on postoperative day 4 tolerating a regular diet. Prior to discharge, the wicking material in her incision was removed. We will get patient scheduled for a staple removal appointment. She has follow-up scheduled for 04/21/22 with Dr. Pricilla Holm via telephone visit.  Consults: None  Significant Diagnostic Studies: CT Angio Chest Pulmonary Embolism, Chest Xray  Treatments: analgesia: acetaminophen, Dilaudid, and oxycodone, tramadol, anticoagulation:  enoxaparin prophylaxis, insulin: glargine, novolog, and surgery:  Diagnostic laparoscopy with conversion to exploratory laparotomy, lysis of adhesions and enterolysis for approximately 2.5 hours, right salpingo-oophorectomy, resection of two segments of small bowel with side to side reanastomosis, resection of suspected small bowel diverticula, omental biopsy   Discharge Exam: Blood pressure 121/75, pulse (!) 118, temperature 97.6 F (36.4 C), temperature source Oral, resp. rate 14, height 5\' 6"  (1.676 m), weight 254 lb 9.6 oz (115.5 kg), last menstrual period 01/14/2014, SpO2 97 %. General: alert, cooperative, no distress, and resting comfortably in bed Resp: clear to auscultation bilaterally Cardio: tachycardic, regular rhythm GI: incision: midline abdominal incision with dry dressing- incision wicks underneath in between staples on incision, lap sites to the abdomen with dermabond intact and abdomen soft, obese, mildly hypoactive bowel sounds Extremities: extremities normal, atraumatic, no cyanosis or edema  Disposition: Discharge disposition: 01-Home or Self Care       Discharge Instructions     Discharge wound care:   Complete by: As directed    You can rinse over incision but do not scrub directly. Pat dry.      Allergies as of 04/17/2022       Reactions   Toradol [ketorolac Tromethamine] Other (See Comments)   Chest tightness, flushed         Medication List     STOP taking these medications    Orilissa 150 MG Tabs Generic drug: Elagolix Sodium       TAKE these medications    acetaminophen 500 MG tablet Commonly known as: TYLENOL Take 1,000 mg by mouth every 6 (six) hours as needed for moderate pain.   Advocate Insulin Pen Needles 33G X 4 MM Misc Generic drug: Insulin Pen Needle 1 each by Does not apply route daily.   apixaban 2.5 MG Tabs tablet Commonly known as: Eliquis  Take 1 tablet (2.5 mg total) by mouth 2 (two) times daily for 24 days.    busPIRone 10 MG tablet Commonly known as: BUSPAR Take 1 tablet (10 mg total) by mouth 3 (three) times daily. Replaces alprazolam XR.   cyclobenzaprine 10 MG tablet Commonly known as: FLEXERIL TAKE 1 TABLET BY MOUTH THREE TIMES A DAY AS NEEDED FOR MUSCLE SPASM   dapagliflozin propanediol 10 MG Tabs tablet Commonly known as: Farxiga Take 1 tablet (10 mg total) by mouth daily.   FLUoxetine 40 MG capsule Commonly known as: PROZAC Take 1 capsule (40 mg total) by mouth daily.   FreeStyle Libre 3 Sensor Misc 1 Device by Does not apply route every 14 (fourteen) days. Place 1 sensor on the skin every 14 days. Use to check glucose continuously   gabapentin 600 MG tablet Commonly known as: NEURONTIN Take 2 tablets (1,200 mg total) by mouth 3 (three) times daily.   ketoconazole 2 % shampoo Commonly known as: NIZORAL APPLY 1 APPLICATION. TOPICALLY 2 (TWO) TIMES A WEEK.   Lantus SoloStar 100 UNIT/ML Solostar Pen Generic drug: insulin glargine Inject 30 Units into the skin 2 (two) times daily. May titrate to 35 units qam and qpm if needed.   metoprolol succinate 25 MG 24 hr tablet Commonly known as: TOPROL-XL Take 1 tablet (25 mg total) by mouth daily.   nystatin cream Commonly known as: MYCOSTATIN Apply 1 Application topically 2 (two) times daily as needed for dry skin.   ondansetron 4 MG disintegrating tablet Commonly known as: Zofran ODT Take 1 tablet (4 mg total) by mouth every 8 (eight) hours as needed for nausea or vomiting.   oxyCODONE 5 MG immediate release tablet Commonly known as: Oxy IR/ROXICODONE Take 1-2 tablets (5-10 mg total) by mouth every 4 (four) hours as needed for moderate pain or severe pain.   polyethylene glycol 17 g packet Commonly known as: MIRALAX / GLYCOLAX Take 17 g by mouth daily as needed (No BM in 24-36 hours).   QUEtiapine 50 MG tablet Commonly known as: SEROQUEL Take 1 tablet (50 mg total) by mouth at bedtime.   senna 8.6 MG Tabs  tablet Commonly known as: SENOKOT Take 1 tablet (8.6 mg total) by mouth at bedtime.   tirzepatide 5 MG/0.5ML Pen Commonly known as: MOUNJARO Inject 5 mg into the skin once a week. Increase to 7.5mg  after 4 weeks What changed: Another medication with the same name was removed. Continue taking this medication, and follow the directions you see here.               Discharge Care Instructions  (From admission, onward)           Start     Ordered   04/17/22 0000  Discharge wound care:       Comments: You can rinse over incision but do not scrub directly. Pat dry.   04/17/22 1420             Greater than thirty minutes were spend for face to face discharge instructions and discharge orders/summary in EPIC.   SignedClide Cliff 04/18/2022, 5:48 PM

## 2022-04-18 NOTE — Transitions of Care (Post Inpatient/ED Visit) (Signed)
   04/18/2022  Name: Christine Grant MRN: [id number] DOB: 01/01/1976  Today's TOC FU Call Status: Today's TOC FU Call Status:: Unsuccessul Call (1st Attempt) Unsuccessful Call (1st Attempt) Date: 04/18/22  Attempted to reach the patient regarding the most recent Inpatient/ED visit.  Follow Up Plan: Additional outreach attempts will be made to reach the patient to complete the Transitions of Care (Post Inpatient/ED visit) call.   Signature Karena Addison, LPN [facility] Nurse Health Advisor Direct Dial [phone]

## 2022-04-19 ENCOUNTER — Other Ambulatory Visit: Payer: Self-pay

## 2022-04-19 ENCOUNTER — Encounter (HOSPITAL_COMMUNITY): Payer: Self-pay | Admitting: Psychiatry

## 2022-04-19 ENCOUNTER — Encounter (HOSPITAL_COMMUNITY): Payer: Self-pay

## 2022-04-19 ENCOUNTER — Observation Stay (HOSPITAL_COMMUNITY)
Admission: AD | Admit: 2022-04-19 | Discharge: 2022-04-21 | Disposition: A | Discharge status: Home / Self Care | Payer: Managed Care, Other (non HMO) | Source: Other Acute Inpatient Hospital | Attending: Psychiatry | Admitting: Psychiatry

## 2022-04-19 DIAGNOSIS — E781 Pure hyperglyceridemia: Secondary | ICD-10-CM

## 2022-04-19 DIAGNOSIS — N838 Other noninflammatory disorders of ovary, fallopian tube and broad ligament: Principal | ICD-10-CM

## 2022-04-19 DIAGNOSIS — Z9889 Other specified postprocedural states: Secondary | ICD-10-CM

## 2022-04-19 DIAGNOSIS — F411 Generalized anxiety disorder: Secondary | ICD-10-CM

## 2022-04-19 DIAGNOSIS — R109 Unspecified abdominal pain: Secondary | ICD-10-CM | POA: Insufficient documentation

## 2022-04-19 DIAGNOSIS — R112 Nausea with vomiting, unspecified: Secondary | ICD-10-CM | POA: Diagnosis not present

## 2022-04-19 DIAGNOSIS — E119 Type 2 diabetes mellitus without complications: Secondary | ICD-10-CM | POA: Diagnosis not present

## 2022-04-19 DIAGNOSIS — Z79899 Other long term (current) drug therapy: Secondary | ICD-10-CM | POA: Insufficient documentation

## 2022-04-19 DIAGNOSIS — Z794 Long term (current) use of insulin: Secondary | ICD-10-CM | POA: Insufficient documentation

## 2022-04-19 DIAGNOSIS — F5101 Primary insomnia: Secondary | ICD-10-CM

## 2022-04-19 DIAGNOSIS — R110 Nausea: Secondary | ICD-10-CM

## 2022-04-19 DIAGNOSIS — G8918 Other acute postprocedural pain: Secondary | ICD-10-CM

## 2022-04-19 DIAGNOSIS — M797 Fibromyalgia: Secondary | ICD-10-CM

## 2022-04-19 DIAGNOSIS — E876 Hypokalemia: Secondary | ICD-10-CM

## 2022-04-19 DIAGNOSIS — R000 Tachycardia, unspecified: Secondary | ICD-10-CM

## 2022-04-19 HISTORY — DX: Other specified postprocedural states: R11.0

## 2022-04-19 HISTORY — DX: Other acute postprocedural pain: G89.18

## 2022-04-19 HISTORY — DX: Other specified postprocedural states: Z98.890

## 2022-04-19 LAB — BASIC METABOLIC PANEL
Anion gap: 7 (ref 5–15)
BUN: 9 mg/dL (ref 6–20)
CO2: 25 mmol/L (ref 22–32)
Calcium: 7.4 mg/dL — ABNORMAL LOW (ref 8.9–10.3)
Chloride: 99 mmol/L (ref 98–111)
Creatinine, Ser: 0.49 mg/dL (ref 0.44–1.00)
GFR, Estimated: >60 mL/min (ref >60)
Glucose, Bld: 228 mg/dL — ABNORMAL HIGH (ref 70–99)
Potassium: 3.3 mmol/L — ABNORMAL LOW (ref 3.5–5.1)
Sodium: 131 mmol/L — ABNORMAL LOW (ref 135–145)

## 2022-04-19 LAB — GLUCOSE, CAPILLARY
Glucose-Capillary: 195 mg/dL — ABNORMAL HIGH (ref 70–99)
Glucose-Capillary: 164 mg/dL — ABNORMAL HIGH (ref 70–99)
Glucose-Capillary: 114 mg/dL — ABNORMAL HIGH (ref 70–99)
Glucose-Capillary: 107 mg/dL — ABNORMAL HIGH (ref 70–99)

## 2022-04-19 MED ORDER — GABAPENTIN 300 MG PO CAPS
1200.0000 mg | ORAL_CAPSULE | Freq: Three times a day (TID) | ORAL | Status: DC
  Administered 2022-04-19 – 2022-04-21 (×7): 1200 mg via ORAL
  Filled 2022-04-19: qty 12
  Filled 2022-04-19: qty 4
  Filled 2022-04-19 (×3): qty 12
  Filled 2022-04-19: qty 4
  Filled 2022-04-19: qty 12

## 2022-04-19 MED ORDER — ONDANSETRON HCL 4 MG PO TABS: 4.0000 mg | ORAL_TABLET | Freq: Four times a day (QID) | ORAL | Status: DC | PRN

## 2022-04-19 MED ORDER — INSULIN GLARGINE-YFGN 100 UNIT/ML ~~LOC~~ SOLN
30.0000 [IU] | Freq: Every day | SUBCUTANEOUS | Status: DC
  Administered 2022-04-19 – 2022-04-20 (×2): 30 [IU] via SUBCUTANEOUS
  Filled 2022-04-19 (×3): qty 0.3

## 2022-04-19 MED ORDER — SODIUM CHLORIDE 0.9 % IV SOLN: INTRAVENOUS | Status: DC

## 2022-04-19 MED ORDER — SIMETHICONE 80 MG PO CHEW: 80.0000 mg | CHEWABLE_TABLET | Freq: Four times a day (QID) | ORAL | Status: DC | PRN

## 2022-04-19 MED ORDER — SENNA 8.6 MG PO TABS
1.0000 | ORAL_TABLET | Freq: Every day | ORAL | Status: DC
  Administered 2022-04-19 – 2022-04-20 (×2): 8.6 mg via ORAL
  Filled 2022-04-19 (×2): qty 1

## 2022-04-19 MED ORDER — POLYETHYLENE GLYCOL 3350 17 G PO PACK
17.0000 g | PACK | Freq: Every day | ORAL | Status: DC
  Administered 2022-04-19 – 2022-04-21 (×3): 17 g via ORAL
  Filled 2022-04-19 (×3): qty 1

## 2022-04-19 MED ORDER — HYDROMORPHONE HCL 1 MG/ML IJ SOLN
0.2000 mg | INTRAMUSCULAR | Status: DC | PRN
  Administered 2022-04-19 – 2022-04-20 (×2): 0.6 mg via INTRAVENOUS
  Filled 2022-04-19 (×2): qty 1

## 2022-04-19 MED ORDER — ACETAMINOPHEN 500 MG PO TABS
1000.0000 mg | ORAL_TABLET | Freq: Four times a day (QID) | ORAL | Status: DC
  Administered 2022-04-19 – 2022-04-21 (×9): 1000 mg via ORAL
  Filled 2022-04-19 (×10): qty 2

## 2022-04-19 MED ORDER — QUETIAPINE FUMARATE 50 MG PO TABS
50.0000 mg | ORAL_TABLET | Freq: Every day | ORAL | Status: DC
  Administered 2022-04-19 – 2022-04-20 (×2): 50 mg via ORAL
  Filled 2022-04-19 (×2): qty 1

## 2022-04-19 MED ORDER — FLUOXETINE HCL 20 MG PO CAPS
40.0000 mg | ORAL_CAPSULE | Freq: Every day | ORAL | Status: DC
  Administered 2022-04-19 – 2022-04-21 (×3): 40 mg via ORAL
  Filled 2022-04-19 (×3): qty 2

## 2022-04-19 MED ORDER — ORAL CARE MOUTH RINSE: 15.0000 mL | OROMUCOSAL | Status: DC | PRN

## 2022-04-19 MED ORDER — ENOXAPARIN SODIUM 40 MG/0.4ML IJ SOSY
40.0000 mg | PREFILLED_SYRINGE | INTRAMUSCULAR | Status: DC
  Administered 2022-04-19 – 2022-04-21 (×3): 40 mg via SUBCUTANEOUS
  Filled 2022-04-19 (×3): qty 0.4

## 2022-04-19 MED ORDER — METOPROLOL SUCCINATE ER 25 MG PO TB24
25.0000 mg | ORAL_TABLET | Freq: Every day | ORAL | Status: DC
  Administered 2022-04-19 – 2022-04-21 (×3): 25 mg via ORAL
  Filled 2022-04-19 (×3): qty 1

## 2022-04-19 MED ORDER — OXYCODONE HCL 5 MG PO TABS
5.0000 mg | ORAL_TABLET | ORAL | Status: DC | PRN
  Administered 2022-04-19: 10 mg via ORAL
  Administered 2022-04-19: 5 mg via ORAL
  Administered 2022-04-19 – 2022-04-21 (×10): 10 mg via ORAL
  Filled 2022-04-19 (×12): qty 2

## 2022-04-19 MED ORDER — BUSPIRONE HCL 5 MG PO TABS
10.0000 mg | ORAL_TABLET | Freq: Three times a day (TID) | ORAL | Status: DC
  Administered 2022-04-19 – 2022-04-21 (×7): 10 mg via ORAL
  Filled 2022-04-19 (×7): qty 2

## 2022-04-19 MED ORDER — ONDANSETRON HCL 4 MG/2ML IJ SOLN
4.0000 mg | Freq: Four times a day (QID) | INTRAMUSCULAR | Status: DC | PRN
  Administered 2022-04-19: 4 mg via INTRAVENOUS
  Filled 2022-04-19: qty 2

## 2022-04-19 MED ORDER — INSULIN ASPART 100 UNIT/ML IJ SOLN
0.0000 [IU] | Freq: Three times a day (TID) | INTRAMUSCULAR | Status: DC
  Administered 2022-04-19 (×2): 4 [IU] via SUBCUTANEOUS
  Administered 2022-04-20: 7 [IU] via SUBCUTANEOUS
  Administered 2022-04-21: 3 [IU] via SUBCUTANEOUS

## 2022-04-19 MED ORDER — SODIUM CHLORIDE 0.9 % IV SOLN
INTRAVENOUS | Status: DC
  Filled 2022-04-19 (×3): qty 1000

## 2022-04-19 NOTE — Plan of Care (Signed)
  Problem: Education: Goal: Knowledge of General Education information will improve Description: Including pain rating scale, medication(s)/side effects and non-pharmacologic comfort measures Outcome: Progressing   Problem: Health Behavior/Discharge Planning: Goal: Ability to manage health-related needs will improve Outcome: Progressing   Problem: Clinical Measurements: Goal: Ability to maintain clinical measurements within normal limits will improve Outcome: Progressing Goal: Will remain free from infection Outcome: Progressing Goal: Diagnostic test results will improve Outcome: Progressing Goal: Respiratory complications will improve Outcome: Progressing Goal: Cardiovascular complication will be avoided Outcome: Progressing   Problem: Activity: Goal: Risk for activity intolerance will decrease Outcome: Progressing   Problem: Nutrition: Goal: Adequate nutrition will be maintained Outcome: Progressing   Problem: Coping: Goal: Level of anxiety will decrease Outcome: Progressing   Problem: Elimination: Goal: Will not experience complications related to bowel motility Outcome: Progressing Goal: Will not experience complications related to urinary retention Outcome: Progressing   Problem: Pain Managment: Goal: General experience of comfort will improve Outcome: Progressing   Problem: Safety: Goal: Ability to remain free from injury will improve Outcome: Progressing   Problem: Skin Integrity: Goal: Risk for impaired skin integrity will decrease Outcome: Progressing   Problem: Education: Goal: Knowledge of the prescribed therapeutic regimen will improve Outcome: Progressing Goal: Understanding of sexual limitations or changes related to disease process or condition will improve Outcome: Progressing Goal: Individualized Educational Video(s) Outcome: Progressing   Problem: Self-Concept: Goal: Communication of feelings regarding changes in body function or  appearance will improve Outcome: Progressing   Problem: Skin Integrity: Goal: Demonstration of wound healing without infection will improve Outcome: Progressing   Problem: Education: Goal: Ability to describe self-care measures that may prevent or decrease complications (Diabetes Survival Skills Education) will improve Outcome: Progressing Goal: Individualized Educational Video(s) Outcome: Progressing   Problem: Coping: Goal: Ability to adjust to condition or change in health will improve Outcome: Progressing   Problem: Fluid Volume: Goal: Ability to maintain a balanced intake and output will improve Outcome: Progressing   Problem: Health Behavior/Discharge Planning: Goal: Ability to identify and utilize available resources and services will improve Outcome: Progressing Goal: Ability to manage health-related needs will improve Outcome: Progressing   Problem: Metabolic: Goal: Ability to maintain appropriate glucose levels will improve Outcome: Progressing   Problem: Nutritional: Goal: Maintenance of adequate nutrition will improve Outcome: Progressing Goal: Progress toward achieving an optimal weight will improve Outcome: Progressing   Problem: Skin Integrity: Goal: Risk for impaired skin integrity will decrease Outcome: Progressing   Problem: Tissue Perfusion: Goal: Adequacy of tissue perfusion will improve Outcome: Progressing

## 2022-04-19 NOTE — H&P (Signed)
Gynecologic Oncology H&P  47 year old female s/p diagnostic laparoscopy with conversion to exploratory laparotomy, lysis of adhesions and enterolysis for approximately 2.5 hours, right salpingo-oophorectomy, resection of two segments of small bowel with side to side reanastomosis, resection of suspected small bowel diverticula, omental biopsy on 04/13/2022 by Dr. Eugene Garnet. She was discharged home on POD 4.  She has been readmitted as of 04/19/2022 for post-operative nausea, emesis, abdominal pain, UTI. She initially sought care in [facility].  Treatment History: Christine Grant was initially seen in our clinic in 2015-2016 in the setting of chronic pelvic pain, narcotic dependency, and desire for surgical intervention.  She was found to have a thickened endometrial lining and given risk factors for endometrial hyperplasia and malignancy, endometrial sampling was recommended.  She underwent D&C on 02/13/2014 with final pathology revealing benign secretory endometrium.  Her pain was very difficult to manage postoperatively as described in Dr. Oliver Hum note from 02/28/2014.   Her subsequent treatment history is as noted below from most recent 2 oldest.  Shortly after her visit last in our clinic, she underwent robotic hysterectomy. Other surgeries since have included left salpingo-oophorectomy for enlarged ovary and concern for torsion, as well as appendectomy.  At the time of both surgeries, extensive pelvic adhesions were noted in both surgeries required conversion from laparoscopy to open.   2016: Robotic hysterectomy for abnormal uterine bleeding and chronic pelvic pain.  Per patient report, extensive adhesions were noted at that time as well as likely endometriosis.  06/2017: Patient underwent exploratory laparoscopy converted to exploratory laparotomy, lysis of adhesion, cystoscopy, left salpingo-oophorectomy, and sigmoidoscopy in the setting of abdominal pain and concern for ovarian torsion.  Procedure  was complicated by dense adhesive disease requiring conversion to open procedure with an estimated blood loss of 300 cc.  At the time of presentation, patient was noted to have an enlarged 6.6 cm left ovary with both imaging and exam findings concerning for torsion.  Findings at the time of surgery included omental and small bowel adhesions to the anterior abdominal wall.  Sigmoid colon was draped over the left ovary.  Right ovary noted to be normal in appearance and adherent to the sidewall.  Pathology from surgery showed hemorrhagic cyst and intraparenchymal ovarian hemorrhage consistent with ischemic changes.  Benign ovary. 02/04/2018: Pelvic ultrasound shows right ovary measures 7.8 x 6.4 x 5.9 cm with a 6.4 x 5.8 x 4.7 cm cyst containing multiple thin internal septations and membranes with low-level internal echoes.  Cyst thought to most likely represent a hemorrhagic cyst or endometrioma. 06/15/2019: CT of the abdomen and pelvis shows borderline dilation of the tip of the appendix measuring 8 mm with very minimal inflammation.  Ill-defined 1.5 cm hypoattenuating lesion in the posterior segment 4 of the liver.  Chronic scarring along the anterior bladder and in the soft tissues of the low anterior pelvic wall. 06/16/2019: Pelvic ultrasound exam reveals a right ovary measuring 3.1 x 2.3 x 2.6 cm with a small anechoic follicle measuring 1.2 cm.  Previously seen hemorrhagic cyst noted to have involuted. 06/2019: Patient presented with several months of right lower quadrant pain with episodes of nausea.  Given concern for early appendicitis, patient was taken to the operating room for an appendectomy.  This was a laparoscopic procedure converted to open secondary to significant adhesive disease.  Findings at the time of surgery included a enlarged body and tip of the appendix.  Postoperative course was complicated by delayed healing of incision. 03/06/2020: Pelvic ultrasound  reveals right ovary measures 3.3 x 3.2 x  2.6 cm with a 1.8 cm dominant follicle. 06/28/2020: Pelvic ultrasound exam at [facility] shows right ovary measuring 3.9 x 2.7 x 2.8 cm with a 1.4 cm simple appearing cyst. 10/30/2020: Pelvic MRI at [facility] shows right ovary measuring 3.5 x 1.8 x 2 cm.  Multiple follicles present.  Within the inferior aspect of the ovary, there is an involuting ovarian cyst with typical peripheral enhancement measuring 2 cm.  Within the superior aspect, there is a T1/T2 hypointense lesion measuring 1.8 x 2 cm, demonstrating diffuse enhancement postcontrast.  Nodule appears to be within the ovary but closely abuts the cecum.  In retrospect review of prior CT scans, this nodule appears to have been present since 2020 and slightly hyperdense as compared to surrounding ovarian tissue. 12/17/2020: CT of the abdomen and pelvis shows enhancing area within the right ovary, possibly due to endometrioma, hemorrhagic cyst, or mass.  This is measured to be at least 2.3 x 1.5 cm. 12/17/2020: Pelvic ultrasound exam at [facility].  Right ovary measures 3.5 x 3.2 x 3.2 cm.  Mildly hypoechoic mass within the right ovary measures 1.8 x 1.6 x 1.7 cm and has associated Doppler flow.  Not significantly changed in size compared to October 2022 MRI. 02/17/2021: Pelvic ultrasound exam at [facility] health shows right ovary measuring 3.5 x 2.7 x 3.1 cm.  Small lesion measuring 1.7 x 1.6 x 1.3 cm which does appear to have small amount of internal flow or possible artifact.  Impression is that there is a more solid-appearing lesion on the right ovary compared to 2 cyst like structures on prior examinations.  The cyst could represent hemorrhagic complex cyst or proteinaceous ovarian cyst.  Low degree suspicion for neoplasm considering waxing and waning appearance of the cyst on multiple prior exams.   CEA on 10/28/2020: 3.1   Patient was seen most recently by Dr. Para March on 1/27 in the setting of continued right lower quadrant pain. Most recent  pelvic ultrasound exam in our system on 2/25 shows right ovary measures 3.3 x 3.2 x 2.6 cm with a 1.8 cm dominant follicle in the ovary.  No suspicious masses.   See was started on [place] in early 2023.    Pelvic ultrasound in 05/2021 revealed right ovary measures 3.4 x 3.1 x 3 cm.  Previous ovarian cyst no longer seen.  Within the right ovary is a suggestion of a 2.7 cm echogenic solid lesion with vascularity.   CT of the abdomen and pelvis on 06/24/2021 reveals no acute abnormality seen.     MRI of the pelvis on 06/30/2021 reveals a 2.5 cm solid homogeneously enhancing right ovarian mass.  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   MRI of the pelvis on 12/28/2021 shows a solid right enhancing ovarian lesion within 1 mm of measurements from June 2023 but enlarged since October 2022.  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  No ascites or peritoneal nodularity, no adenopathy.  Postoperative changes along the lower abdominal wall with thinning or absence of the right rectus muscle and laxity of the abdominal wall is similar to prior imaging.   Pelvic ultrasound at [facility] on 03/01/2022 reveals an isoechoic area within the right ovary measuring up to 2.4 cm, thought to perhaps represent a hemorrhagic cyst.   CT of the abdomen and pelvis at [facility] on 03/21/2022 shows 3 cm  solid enhancing lesion of the right adnexa, unchanged from prior study.   Pelvic ultrasound on 04/02/2022 at [facility] reveals the right ovary measures 4 x 2.8 x 3.7 cm.  No mass or fluid collection noted.  She is s/p diagnostic laparoscopy with conversion to exploratory laparotomy, lysis of adhesions and enterolysis for approximately 2.5 hours, right salpingo-oophorectomy, resection of two segments of small bowel with side to side reanastomosis, resection of suspected small bowel diverticula, omental biopsy on 04/13/2022 by Dr.  Eugene Garnet. She was discharged home on POD 4.  She has been readmitted as of 04/19/2022 for post-operative nausea, emesis, abdominal pain, UTI. She initially sought care in [place] [facility].   Interval History: Patient reports improvement in nausea. No emesis reported. Passing flatus. Last BM was prior to transfer from [place]. Denies chest pain, dyspnea, cough. Feels steady when out of bed. Emptying bladder without difficulty. Pain is improving and is reported more in the mid abdomen. Tolerating sips of liquids. At home yesterday, she reported severe nausea, increased abdominal pain, shortness of breath, and feeling faint. She called EMS and was taken to [facility] ER. She was eventually transferred to [facility]     Past Medical/Surgical History:     Past Medical History:  Diagnosis Date   Abnormal uterine bleeding     Anxiety     Arthritis      knees   Chronic pelvic pain in female     Degenerative joint disease      back   Depression     Endometriosis of pelvis     Fibromyalgia     History of ketoacidosis      01-15-2014   PONV (postoperative nausea and vomiting)     Sinus tachycardia      on metoprolol   Type 2 diabetes mellitus (HCC)     Wears contact lenses             Past Surgical History:  Procedure Laterality Date   ABDOMINAL HYSTERECTOMY       APPENDECTOMY       BREAST REDUCTION SURGERY   age 74   CESAREAN SECTION   2002,  2005,  2008   DILATION AND CURETTAGE OF UTERUS N/A 02/13/2014    Procedure: DILATATION AND CURETTAGE;  Surgeon: Adolphus Birchwood, MD;  Location: [facility];  Service: Gynecology;  Laterality: N/A;   LEFT OOPHORECTOMY   2019   MANDIBLE SURGERY   age 54    Correct overbite   REDUCTION MAMMAPLASTY               Family History  Adopted: Yes  Problem Relation Age of Onset   Hyperlipidemia Mother     Hypertension Mother     Diabetes Mother     Breast cancer Maternal Aunt     Endometriosis Maternal Aunt     Colon cancer Neg Hx      Pancreatic cancer Neg Hx     Ovarian cancer Neg Hx     Prostate cancer Neg Hx        Social History         Socioeconomic History   Marital status: Married      Spouse name: Not on file   Number of children: Not on file   Years of education: Not on file   Highest education level: Not on file  Occupational History   Occupation: works in medical billing  Tobacco Use   Smoking status: Never   Smokeless tobacco: Never  Vaping Use   Vaping Use: Never used  Substance and Sexual Activity   Alcohol use: No   Drug use: No   Sexual activity: Yes      Birth control/protection: Surgical  Other Topics Concern   Not on file  Social History Narrative   Not on file    Social Determinants of Health        Financial Resource Strain: Low Risk  (04/06/2022)    Overall Financial Resource Strain (CARDIA)     Difficulty of Paying Living Expenses: Not very hard  Food Insecurity: Food Insecurity Present (02/05/2021)    Hunger Vital Sign     Worried About Running Out of Food in the Last Year: Sometimes true     Ran Out of Food in the Last Year: Never true  Transportation Needs: No Transportation Needs (02/05/2021)    PRAPARE - Therapist, artTransportation     Lack of Transportation (Medical): No     Lack of Transportation (Non-Medical): No  Physical Activity: Not on file  Stress: Not on file  Social Connections: Not on file      Current Medications:   Current Outpatient Medications:    acetaminophen (TYLENOL) 500 MG tablet, Take 1,000 mg by mouth every 6 (six) hours as needed for moderate pain., Disp: , Rfl:    busPIRone (BUSPAR) 10 MG tablet, Take 1 tablet (10 mg total) by mouth 3 (three) times daily. Replaces alprazolam XR., Disp: 270 tablet, Rfl: 2   Continuous Blood Gluc Sensor (FREESTYLE LIBRE 3 SENSOR) MISC, 1 Device by Does not apply route every 14 (fourteen) days. Place 1 sensor on the skin every 14 days. Use to check glucose continuously, Disp: 2 each, Rfl: 6   cyclobenzaprine (FLEXERIL) 10 MG  tablet, TAKE 1 TABLET BY MOUTH THREE TIMES A DAY AS NEEDED FOR MUSCLE SPASM, Disp: 270 tablet, Rfl: 1   dapagliflozin propanediol (FARXIGA) 10 MG TABS tablet, Take 1 tablet (10 mg total) by mouth daily., Disp: 90 tablet, Rfl: 3   Elagolix Sodium (ORILISSA) 150 MG TABS, Take 1 tablet (150 mg total) by mouth daily. (Patient not taking: Reported on 04/06/2022), Disp: 30 tablet, Rfl: 3   FLUoxetine (PROZAC) 40 MG capsule, Take 1 capsule (40 mg total) by mouth daily., Disp: 90 capsule, Rfl: 1   gabapentin (NEURONTIN) 600 MG tablet, Take 2 tablets (1,200 mg total) by mouth 3 (three) times daily., Disp: 540 tablet, Rfl: 0   insulin glargine (LANTUS SOLOSTAR) 100 UNIT/ML Solostar Pen, Inject 30 Units into the skin 2 (two) times daily. May titrate to 35 units qam and qpm if needed., Disp: 60 mL, Rfl: 1   Insulin Pen Needle (ADVOCATE INSULIN PEN NEEDLES) 33G X 4 MM MISC, 1 each by Does not apply route daily., Disp: 100 each, Rfl: 12   ketoconazole (NIZORAL) 2 % shampoo, APPLY 1 APPLICATION. TOPICALLY 2 (TWO) TIMES A WEEK., Disp: 120 mL, Rfl: 0   metoprolol succinate (TOPROL-XL) 25 MG 24 hr tablet, Take 1 tablet (25 mg total) by mouth daily., Disp: 90 tablet, Rfl: 1   nystatin cream (MYCOSTATIN), Apply 1 Application topically 2 (two) times daily. (Patient taking differently: Apply 1 Application topically 2 (two) times daily as needed for dry skin.), Disp: 30 g, Rfl: 3   ondansetron (ZOFRAN ODT) 4 MG disintegrating tablet, Take 1 tablet (4 mg total) by mouth every 8 (eight) hours as needed for nausea or vomiting., Disp: 20 tablet, Rfl: 0   QUEtiapine (SEROQUEL) 50 MG tablet, Take 1 tablet (50 mg  total) by mouth at bedtime., Disp: 90 tablet, Rfl: 2   tirzepatide (MOUNJARO) 10 MG/0.5ML Pen, Inject 10 mg into the skin once a week., Disp: 6 mL, Rfl: 1   tirzepatide (MOUNJARO) 5 MG/0.5ML Pen, Inject 5 mg into the skin once a week. Increase to 7.5mg  after 4 weeks, Disp: 2 mL, Rfl: 0   tirzepatide (MOUNJARO) 7.5 MG/0.5ML  Pen, INJECT 7.5 MG INTO THE SKIN ONCE A WEEK. INCREASE TO 10MG  AFTER 4 WEEKS, Disp: 2 mL, Rfl: 0   Review of Systems: See interval. No fever, chills.   Physical Exam:    04/19/2022   11:48 AM 04/19/2022    8:24 AM 04/19/2022    6:05 AM  Vitals with BMI  Systolic 124 112 161  Diastolic 72 62 71  Pulse 105 104 101    General: alert, cooperative, and no distress Resp: clear to auscultation bilaterally Cardio: regular in rhythm, mildly tachycardiac at 102 bpm GI: incision: abdomen obese, soft, active bowel sounds, non-tympanic and midline abdominal incision along with upper abd lap site intact. Staples present in midline abdominal incision with dry dressing noted over the lower aspect. No surrounding erythema or drainage. Extremities: extremities normal, atraumatic, no cyanosis or edema   Laboratory & Radiologic Studies: None new   Assessment & Plan: Christine Grant is a 47 y.o. woman with chronic abdominal and pelvic pain with multiple prior surgeries and known intra-abdominal adhesions. She had been followed for a small, solid-appearing right adnexal mass that has grown very slowly over time, currently approximately 3 cm.   She is s/p diagnostic laparoscopy with conversion to exploratory laparotomy, lysis of adhesions and enterolysis for approximately 2.5 hours, right salpingo-oophorectomy, resection of two segments of small bowel with side to side reanastomosis, resection of suspected small bowel diverticula, omental biopsy on 04/13/2022 with Dr. Eugene Garnet. The postoperative course was notable for tachycardia and need for supplemental oxygen. She was discharged to home on postoperative day 4 meeting post-op milestones.   She is currently readmitted with post-op nausea, emesis, increased abdominal pain, CT at [facility] suggesting developing SBO versus persistent severe bowel ileus. She is currently on bowel rest, IV hydration. She was treated with rocephin in the ER at [facility] for UTI.

## 2022-04-19 NOTE — Progress Notes (Signed)
Mobility Specialist - Progress Note   04/19/22 1253  Mobility  Activity Ambulated independently in hallway  Level of Assistance Independent  Assistive Device None  Distance Ambulated (ft) 250 ft  Activity Response Tolerated well  Mobility Referral Yes  $Mobility charge 1 Mobility   Pt received in bed and agreeable to mobility. No complaints during session. Pt to bed after session with all needs met.    [facility]

## 2022-04-19 NOTE — Progress Notes (Signed)
Gynecologic Oncology Progress Note  Subjective: Patient reports improvement in nausea. No emesis reported. Passing flatus. Last BM was prior to transfer from [place]. Denies chest pain, dyspnea, cough. Feels steady when out of bed. Emptying bladder without difficulty. Pain is improving and is reported more in the mid abdomen. Tolerating sips of liquids. At home yesterday, she reported severe nausea, increased abdominal pain, shortness of breath, and feeling faint. She called EMS and was taken to [facility] ER. She was eventually transferred to [facility]. No needs voiced.  Objective: Vital signs in last 24 hours: Temp:  [97.7 F (36.5 C)-98.4 F (36.9 C)] 98.1 F (36.7 C) (04/09 0824) Pulse Rate:  [101-105] 104 (04/09 0824) Resp:  [18] 18 (04/09 0824) BP: (112-130)/(62-73) 112/62 (04/09 0824) SpO2:  [97 %-100 %] 97 % (04/09 0824) Weight:  [270 lb 8.1 oz (122.7 kg)] 270 lb 8.1 oz (122.7 kg) (04/09 0225) Last BM Date : 04/17/22  Intake/Output from previous day: 04/08 0701 - 04/09 0700 In: 82.1 [I.V.:82.1] Out: -   Physical Examination: General: alert, cooperative, and no distress Resp: clear to auscultation bilaterally Cardio: regular in rhythm, mildly tachycardiac at 102 bpm GI: incision: abdomen obese, soft, active bowel sounds, non-tympanic and midline abdominal incision along with upper abd lap site intact. Staples present in midline abdominal incision with dry dressing noted over the lower aspect. No surrounding erythema or drainage. Extremities: extremities normal, atraumatic, no cyanosis or edema  Labs:   BUN/Cr/glu/ALT/AST/amyl/lip:  9/0.49/--/--/--/--/-- (04/09 0447)  Assessment: 47 y.o. s/p Diagnostic laparoscopy with conversion to exploratory laparotomy, lysis of adhesions and enterolysis for approximately 2.5 hours, right salpingo-oophorectomy, resection of two segments of small bowel with side to side reanastomosis, resection of suspected small bowel diverticula, omental  biopsy on 04/13/2022 with Dr. Eugene Grant. The postoperative course was notable for tachycardia and need for supplemental oxygen. She was discharged to home on postoperative day 4 meeting post-op milestones. The postoperative course was notable for tachycardia and need for supplemental oxygen with both items improving by time of discharge.  She presented to the [facility] ER on 04/18/2022 for moderate to severe abdominal pain post-op, N/V. A CT AP was performed resulting: Status post partial small bowel resection with multiple abnormally distended loops of proximal small bowel measuring up to 4 cm, narrowing distally in the distal small bowel within the upper central pelvis, near an anastomotic suture lines.  This may represent a developing small bowel obstruction or persistent severe bowel ileus.  No evidence for oral contrast and anastomotic leakage, abscess, or pneumoperitoneum. 2.  Mild intraperitoneal fluid. She was transferred to [facility] for admission and to be close to surgical team.   Pain:  Pain is well-controlled on PRN medications.  Heme: Hgb 14.3 and Hct 42.9 on 04/18/22 at [facility]- overall stable post-operatively.  ID: WBC 11.3. UA performed at [facility]-see below. Given rocephin in ER at [facility] for UTI  Component Ref Range & Units Today  Urine Color Yellow Yellow  Urine Clarity Clear Cloudy Abnormal   Urine Specific Gravity 1.005 - 1.030 1.007  Urine pH 5 to 9 6.0  Urine Protein - Dipstick Negative mg/dl Negative  Urine Glucose Negative mg/dL 038 Abnormal   Urine Ketones Negative mg/dl 40 Abnormal   Urine Bilirubin Negative mg/dL Negative  Urine Blood Negative mg/dL Negative  Urine Nitrite Negative Positive Abnormal   Urine Urobilinogen <2 mg/dl <2  Urine Leukocyte Esterase Negative Leu/mcL 500 Abnormal   Urine Squamous Epithelial Cells 0 - 2 /HPF 0  Urine  WBC 0 - 2 /hpf >50 Abnormal   Urine RBC 0 - 2 /HPF 0  Urine Bacteria None /HPF 3+ Abnormal   UA Yeast None  /HPF 1+ Abnormal   UA Microscopic No Micro Yes Micro Abnormal     CV: Overall BP and HR stable. Mild tachycardia-at baseline.   GI:  Tolerating po: yes, sips of clears.  GU: Voiding without difficulty. Treated for UTI at [facility]. Creatinine 0.49 this am.  FEN: K+ 3.3 this am. No critical values.  Endo: Diabetes mellitus Type II, under fair control. Improving. CBG: CBG (last 3)  Recent Labs    04/17/22 1130 04/19/22 0820 04/19/22 1150  GLUCAP 221* 195* 164*     Prophylaxis: SCDs in the room.  Plan: Add K+ to IVF Continue with clears. Diet advancement per Dr. Alvester Grant Encourage increasing mobility Continue plan of care per Dr. Alvester Grant   LOS: 1 day    Christine Grant D Christine Grant 04/19/2022, 10:00 AM

## 2022-04-19 NOTE — Plan of Care (Signed)
  Problem: Health Behavior/Discharge Planning: Goal: Ability to manage health-related needs will improve Outcome: Progressing   Problem: Clinical Measurements: Goal: Ability to maintain clinical measurements within normal limits will improve Outcome: Progressing   Problem: Clinical Measurements: Goal: Will remain free from infection Outcome: Progressing   

## 2022-04-20 DIAGNOSIS — G8918 Other acute postprocedural pain: Secondary | ICD-10-CM | POA: Diagnosis not present

## 2022-04-20 LAB — BASIC METABOLIC PANEL
Anion gap: 5 (ref 5–15)
BUN: 7 mg/dL (ref 6–20)
CO2: 29 mmol/L (ref 22–32)
Calcium: 7.8 mg/dL — ABNORMAL LOW (ref 8.9–10.3)
Chloride: 105 mmol/L (ref 98–111)
Creatinine, Ser: 0.5 mg/dL (ref 0.44–1.00)
GFR, Estimated: >60 mL/min (ref >60)
Glucose, Bld: 109 mg/dL — ABNORMAL HIGH (ref 70–99)
Potassium: 3.8 mmol/L (ref 3.5–5.1)
Sodium: 139 mmol/L (ref 135–145)

## 2022-04-20 LAB — CBC
HCT: 35.3 % — ABNORMAL LOW (ref 36.0–46.0)
Hemoglobin: 11.1 g/dL — ABNORMAL LOW (ref 12.0–15.0)
MCH: 27 pg (ref 26.0–34.0)
MCHC: 31.4 g/dL (ref 30.0–36.0)
MCV: 85.9 fL (ref 80.0–100.0)
Platelets: 299 10*3/uL (ref 150–400)
RBC: 4.11 MIL/uL (ref 3.87–5.11)
RDW: 13.6 % (ref 11.5–15.5)
WBC: 6.3 10*3/uL (ref 4.0–10.5)
nRBC: 0 % (ref 0.0–0.2)

## 2022-04-20 LAB — GLUCOSE, CAPILLARY
Glucose-Capillary: 100 mg/dL — ABNORMAL HIGH (ref 70–99)
Glucose-Capillary: 123 mg/dL — ABNORMAL HIGH (ref 70–99)
Glucose-Capillary: 245 mg/dL — ABNORMAL HIGH (ref 70–99)
Glucose-Capillary: 183 mg/dL — ABNORMAL HIGH (ref 70–99)

## 2022-04-20 NOTE — Progress Notes (Signed)
Mobility Specialist - Progress Note   04/20/22 1417  Mobility  Activity Ambulated with assistance in hallway  Level of Assistance Independent after set-up  Assistive Device None  Distance Ambulated (ft) 600 ft  Activity Response Tolerated well  Mobility Referral Yes  $Mobility charge 1 Mobility   Pt received in bed and agreed to mobility, had no issues throughout session. Pt returned to bed with all needs met.   Marilynne Halsted Mobility Specialist

## 2022-04-20 NOTE — Transitions of Care (Post Inpatient/ED Visit) (Signed)
   04/20/2022  Name: Christine Grant MRN: [id number] DOB: 09-24-1975  Today's TOC FU Call Status: Today's TOC FU Call Status:: Unsuccessful Call (2nd Attempt) Unsuccessful Call (1st Attempt) Date: 04/18/22 Unsuccessful Call (2nd Attempt) Date: 04/20/22  Attempted to reach the patient regarding the most recent Inpatient/ED visit.  Follow Up Plan: No further outreach attempts will be made at this time. We have been unable to contact the patient.  Signature Karena Addison, LPN [facility] Nurse Health Advisor Direct Dial [phone]

## 2022-04-20 NOTE — Progress Notes (Signed)
Gynecologic Oncology Progress Note  Subjective: Patient reports doing better this morning.  She states the nausea is better and has had no emesis. No antiemetics needed per pt. Her midline abdominal pain has improved as well. She is having more flatus.  No BM reported yesterday.  She is agreeable with advancing her diet.  She feels steady when out of the bed.  Voiding without difficulty.  No concerns voiced.  Objective: Vital signs in last 24 hours: Temp:  [97.5 F (36.4 C)-98.1 F (36.7 C)] 97.6 F (36.4 C) (04/10 0505) Pulse Rate:  [92-105] 92 (04/10 0505) Resp:  [18-19] 18 (04/10 0505) BP: (112-127)/(59-74) 118/73 (04/10 0505) SpO2:  [94 %-97 %] 94 % (04/10 0505) Last BM Date : 04/17/22  Intake/Output from previous day: 04/09 0701 - 04/10 0700 In: 1628.8 [P.O.:680; I.V.:948.8] Out: 1900 [Urine:1900]  Physical Examination (performed by Dr. Pricilla Holm): General: alert, cooperative, and no distress Resp: clear to auscultation bilaterally Cardio: regular in rhythm, mild tachycardia improving GI: incision: abdomen obese, soft, active bowel sounds, non-tympanic and midline abdominal incision along with upper abd lap sites intact. Staples present in midline abdominal incision with dry dressing noted over the lower aspect. No surrounding erythema or drainage. Extremities: extremities normal, atraumatic, no cyanosis or edema  Labs: WBC/Hgb/Hct/Plts:  6.3/11.1/35.3/299 (04/10 0440) BUN/Cr/glu/ALT/AST/amyl/lip:  7/0.50/--/--/--/--/-- (04/10 0440)  Assessment: 47 y.o. 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 Eugene Garnet. The postoperative course was notable for tachycardia and need for supplemental oxygen. She was  discharged to home on postoperative day 4 meeting post-op milestones. The postoperative course was notable for tachycardia and need for supplemental oxygen with both items improving by time of discharge.  She presented to the [facility] ER on 04/18/2022 for moderate to severe abdominal pain post-op, N/V. A CT AP was performed resulting: Status post partial small bowel resection with multiple abnormally distended loops of proximal small bowel measuring up to 4 cm, narrowing distally in the distal small bowel within the upper central pelvis, near an anastomotic suture lines.  This may represent a developing small bowel obstruction or persistent severe bowel ileus.  No evidence for oral contrast and anastomotic leakage, abscess, or pneumoperitoneum. 2.  Mild intraperitoneal fluid. She was transferred to [facility] for admission and to be close to surgical team.   Pain:  Pain is well-controlled on PRN medications.  Heme: Hgb 11.1 from 14.3 and Hct 35.3 from 42.9 on 04/18/22 at [facility]- hemodilution, overall stable post-operatively.  ID: WBC down to 6.3 from 11.3. UA performed at [facility]-given rocephin in ER at [facility] for UTI. Afebrile  CV: Overall BP and HR stable. Mild tachycardia at baseline, improved.   GI:  Tolerating po: yes, sips of clears.  GU: Voiding without difficulty. Treated for UTI at [facility]. Creatinine 0.50 this am.  FEN: Hypokalemia improved to 3.8 this am from K+ 3.3. No critical values.  Endo: Diabetes mellitus Type II, under good control. Improving. CBG: CBG (last 3)  Recent Labs    04/19/22 1640 04/19/22 2108 04/20/22 0707  GLUCAP 114* 107* 100*     Prophylaxis: SCDs in the room. Lovenox ordered  Plan: Diet to carb mod IV to saline lock Encourage increasing mobility Continue plan of care per Dr. Pricilla Holm If tolerating her diet, meeting milestones, plan for discharge as soon  as tomorrow   LOS: 1 day    Doylene Bode 04/20/2022, 7:36 AM

## 2022-04-20 NOTE — TOC CM/SW Note (Signed)
  Transition of Care (TOC) Screening Note   Patient Details  Name: Christine Grant Date of Birth: 1975-08-15   Transition of Care [facility]) CM/SW Contact:    Amada Jupiter, LCSW Phone Number: 04/20/2022, 1:03 PM    Transition of Care Department [facility]) has reviewed patient and no TOC needs have been identified at this time. We will continue to monitor patient advancement through interdisciplinary progression rounds. If new patient transition needs arise, please place a TOC consult.

## 2022-04-20 NOTE — Plan of Care (Signed)
  Problem: Education: Goal: Knowledge of General Education information will improve Description: Including pain rating scale, medication(s)/side effects and non-pharmacologic comfort measures Outcome: Progressing   Problem: Health Behavior/Discharge Planning: Goal: Ability to manage health-related needs will improve Outcome: Progressing   Problem: Clinical Measurements: Goal: Ability to maintain clinical measurements within normal limits will improve Outcome: Progressing Goal: Will remain free from infection Outcome: Progressing Goal: Diagnostic test results will improve Outcome: Progressing Goal: Respiratory complications will improve Outcome: Progressing Goal: Cardiovascular complication will be avoided Outcome: Progressing   Problem: Activity: Goal: Risk for activity intolerance will decrease Outcome: Progressing   Problem: Nutrition: Goal: Adequate nutrition will be maintained Outcome: Progressing   Problem: Coping: Goal: Level of anxiety will decrease Outcome: Progressing   Problem: Pain Managment: Goal: General experience of comfort will improve Outcome: Progressing   

## 2022-04-21 ENCOUNTER — Inpatient Hospital Stay: Payer: Managed Care, Other (non HMO) | Admitting: Gynecologic Oncology

## 2022-04-21 DIAGNOSIS — G8918 Other acute postprocedural pain: Secondary | ICD-10-CM | POA: Diagnosis not present

## 2022-04-21 LAB — GLUCOSE, CAPILLARY
Glucose-Capillary: 126 mg/dL — ABNORMAL HIGH (ref 70–99)
Glucose-Capillary: 110 mg/dL — ABNORMAL HIGH (ref 70–99)

## 2022-04-21 LAB — BASIC METABOLIC PANEL
Anion gap: 5 (ref 5–15)
BUN: 14 mg/dL (ref 6–20)
CO2: 29 mmol/L (ref 22–32)
Calcium: 7.9 mg/dL — ABNORMAL LOW (ref 8.9–10.3)
Chloride: 104 mmol/L (ref 98–111)
Creatinine, Ser: 0.59 mg/dL (ref 0.44–1.00)
GFR, Estimated: >60 mL/min (ref >60)
Glucose, Bld: 143 mg/dL — ABNORMAL HIGH (ref 70–99)
Potassium: 3.9 mmol/L (ref 3.5–5.1)
Sodium: 138 mmol/L (ref 135–145)

## 2022-04-21 LAB — CBC
HCT: 35 % — ABNORMAL LOW (ref 36.0–46.0)
Hemoglobin: 11.2 g/dL — ABNORMAL LOW (ref 12.0–15.0)
MCH: 27.6 pg (ref 26.0–34.0)
MCHC: 32 g/dL (ref 30.0–36.0)
MCV: 86.2 fL (ref 80.0–100.0)
Platelets: 332 10*3/uL (ref 150–400)
RBC: 4.06 MIL/uL (ref 3.87–5.11)
RDW: 13.6 % (ref 11.5–15.5)
WBC: 7.6 10*3/uL (ref 4.0–10.5)
nRBC: 0 % (ref 0.0–0.2)

## 2022-04-21 NOTE — Progress Notes (Addendum)
Gynecologic Oncology Progress Note  Subjective: Patient reports doing better this am. Had a BM and passing flatus. Pain is "not too bad." Tolerating diet with no nausea or emesis. Ambulating without difficulty. No concerns voiced.  Objective: Vital signs in last 24 hours: Temp:  [97.6 F (36.4 C)-98.4 F (36.9 C)] 98 F (36.7 C) (04/11 0510) Pulse Rate:  [96-106] 97 (04/11 0510) Resp:  [16-19] 16 (04/11 0510) BP: (116-130)/(66-77) 116/66 (04/11 0510) SpO2:  [94 %-96 %] 95 % (04/11 0510) Last BM Date : 04/20/22  Intake/Output from previous day: 04/10 0701 - 04/11 0700 In: 750 [P.O.:750] Out: 1000 [Urine:1000]  Physical Examination (performed by Dr. Pricilla Holm): General: alert, cooperative, and no distress Resp: clear to auscultation bilaterally Cardio: regular rate and rhythm, S1, S2 normal, no murmur, click, rub or gallop GI: incision: abdomen obese, soft, active bowel sounds, non-tympanic and midline abdominal incision along with upper abd lap sites intact. Staples present in midline abdominal incision with dry dressing noted over the lower aspect. No surrounding erythema or drainage. Extremities: extremities normal, atraumatic, no cyanosis or edema  Labs: WBC/Hgb/Hct/Plts:  7.6/11.2/35.0/332 (04/11 0445) BUN/Cr/glu/ALT/AST/amyl/lip:  14/0.59/--/--/--/--/-- (04/11 0445)  Assessment: 47 y.o. 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 Eugene Garnet. She was discharged to home on postoperative day 4 meeting post-op milestones. The postoperative course was notable for tachycardia and need for supplemental oxygen with both items improving by time of discharge.  She presented to the [facility] ER on 04/18/2022 for  moderate to severe abdominal pain post-op, N/V. A CT AP was performed resulting: Status post partial small bowel resection with multiple abnormally distended loops of proximal small bowel measuring up to 4 cm, narrowing distally in the distal small bowel within the upper central pelvis, near an anastomotic suture lines.  This may represent a developing small bowel obstruction or persistent severe bowel ileus.  No evidence for oral contrast and anastomotic leakage, abscess, or pneumoperitoneum. 2.  Mild intraperitoneal fluid. She was transferred to [facility] for admission and to be close to surgical team.   Pain:  Pain is well-controlled on PRN medications.  Heme: Hgb 11.2 from 11.1 and Hct 35.0 from 35.3- overall stable post-operatively.  ID: WBC 7.6 this am. UA performed at [facility]-given rocephin in ER at [facility] for UTI. Afebrile  CV: Overall BP and HR stable. Mild tachycardia improved.   GI:  Tolerating po: yes.  GU: Voiding without difficulty. Treated for UTI at [facility]. Creatinine 0.59 this am.  FEN: Hypokalemia improved to 3.9 this am from K+ 3.3. No critical values.  Endo: Diabetes mellitus Type II, under good control. Improving. CBG: CBG (last 3)  Recent Labs    04/20/22 1132 04/20/22 1658 04/20/22 2002  GLUCAP 123* 245* 183*     Prophylaxis: SCDs in the room. Lovenox ordered  Plan: Plan for discharge around lunchtime if continuing to do well and diet tolerated Pt to come back to the office on Monday for staple removal   LOS: 1 day    Doylene Bode 04/21/2022, 7:14 AM

## 2022-04-21 NOTE — Discharge Instructions (Signed)
04/21/2022  Return to work: 4-6 weeks if applicable  Return to the office for staple removal on Monday April 15.  You can resume taking Eliquis, blood thinner, twice daily. If you received your lovenox injection before you left the hospital, then restart the Eliquis the next day.  Activity: 1. Be up and out of the bed during the day.  Take a nap if needed.  You may walk up steps but be careful and use the hand rail.  Stair climbing will tire you more than you think, you may need to stop part way and rest.   2. No lifting or straining over 10 lbs, pushing, pulling, straining for 6 weeks.  3. No driving for 2 week(s).  Do not drive if you are taking narcotic pain medicine. You need to make sure your reaction time has returned and you can brake safely.  4. Shower daily.  Use your regular soap to bathe and when finished pat your incision dry; don't rub.  No tub baths until cleared by your surgeon.   5. No sexual activity and nothing in the vagina for 4-6 weeks.  6. You may experience a small amount of clear drainage from your incisions, which is normal.  If the drainage persists or increases, please call the office.  7. Take Tylenol or ibuprofen first for pain and only use narcotic pain medication for severe pain not relieved by the Tylenol or Ibuprofen.  Monitor your Tylenol intake to a max of 4,000 mg.   Diet: 1. Low sodium Heart Healthy Diet is recommended.  2. It is safe to use a laxative, such as Miralax or Colace, if you have difficulty moving your bowels. You can take Sennakot at bedtime every evening to keep bowel movements regular and to prevent constipation.    Wound Care: 1. Keep clean and dry.  Shower daily.  Reasons to call the Doctor: Fever - Oral temperature greater than 100.4 degrees Fahrenheit Foul-smelling vaginal discharge Difficulty urinating Nausea and vomiting Increased pain at the site of the incision that is unrelieved with pain medicine. Difficulty breathing  with or without chest pain New calf pain especially if only on one side Sudden, continuing increased vaginal bleeding with or without clots.   Contacts: For questions or concerns you should contact:  Dr. Eugene Garnet at [phone]  Warner Mccreedy, NP at [phone]  After Hours: call [phone] and have the GYN Oncologist paged/contacted

## 2022-04-21 NOTE — Discharge Summary (Signed)
Physician Discharge Summary  Patient ID: Christine Grant MRN: [id number] DOB/AGE: 1975-05-06 47 y.o.  Admit date: 04/19/2022 Discharge date: 04/21/2022  Admission Diagnoses: Postoperative pain  Discharge Diagnoses:  Principal Problem:   Postoperative pain Active Problems:   Diabetes   Postoperative nausea   Discharged Condition:  The patient is in good condition and stable for discharge.    Hospital Course: 47 y.o. 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 Eugene Garnet. She was discharged to home on postoperative day 4 meeting post-op milestones. The postoperative course was notable for tachycardia and need for supplemental oxygen with both items improving by time of discharge.   She presented to the [facility] ER on 04/18/2022 for moderate to severe abdominal pain post-op, N/V. A CT AP was performed resulting: Status post partial small bowel resection with multiple abnormally distended loops of proximal small bowel measuring up to 4 cm, narrowing distally in the distal small bowel within the upper central pelvis, near an anastomotic suture lines.  This may represent a developing small bowel obstruction or persistent severe bowel ileus.  No evidence for oral contrast and anastomotic leakage, abscess, or pneumoperitoneum. 2.  Mild intraperitoneal fluid. She was transferred to [facility] for admission and to be close to surgical team.   Consults: None  Significant Diagnostic Studies: Labs, IV hydration  Treatments: IVF, IV pain medication, Given Rocephin IV in ER at [facility]  Discharge Exam: Blood pressure 116/66, pulse 97, temperature 98 F  (36.7 C), temperature source Oral, resp. rate 16, height 5\' 6"  (1.676 m), weight 270 lb 8.1 oz (122.7 kg), last menstrual period 01/14/2014, SpO2 95 %. General: alert, cooperative, and no distress Resp: clear to auscultation bilaterally Cardio: regular rate and rhythm, S1, S2 normal, no murmur, click, rub or gallop GI: incision: abdomen obese, soft, active bowel sounds, non-tympanic and midline abdominal incision along with upper abd lap sites intact. Staples present in midline abdominal incision with dry dressing noted over the lower aspect. No surrounding erythema or drainage. Extremities: extremities normal, atraumatic, no cyanosis or edema  Disposition: Discharge disposition: 01-Home or Self Care       Discharge Instructions     Call MD for:  difficulty breathing, headache or visual disturbances   Complete by: As directed    Call MD for:  extreme fatigue   Complete by: As directed    Call MD for:  hives   Complete by: As directed    Call MD for:  persistant dizziness or light-headedness   Complete by: As directed    Call MD for:  persistant nausea and vomiting   Complete by: As directed    Call MD for:  redness, tenderness, or signs of infection (pain, swelling, redness, odor or green/yellow discharge around incision site)   Complete by: As directed    Call MD for:  severe uncontrolled pain   Complete by: As directed    Call MD for:  temperature >100.4   Complete by: As directed    Diet - low sodium heart healthy   Complete by: As directed    Discharge wound care:   Complete by: As directed    Plan for staple removal in the office on Monday, April 15  Driving Restrictions   Complete by: As directed    No driving for 2 week(s).  Do not take narcotics and drive. You need to make sure your reaction time has returned.   Increase activity slowly   Complete by: As directed    Lifting restrictions   Complete by: As directed    No lifting greater than 10 lbs, pushing,  pulling, straining for 6 weeks.   Sexual Activity Restrictions   Complete by: As directed    No sexual activity, nothing in the vagina, for 4-6 weeks.      Allergies as of 04/21/2022       Reactions   Toradol [ketorolac Tromethamine] Other (See Comments)   Chest tightness, flushed         Medication List     TAKE these medications    acetaminophen 500 MG tablet Commonly known as: TYLENOL Take 1,000 mg by mouth every 6 (six) hours as needed for moderate pain.   Advocate Insulin Pen Needles 33G X 4 MM Misc Generic drug: Insulin Pen Needle 1 each by Does not apply route daily.   apixaban 2.5 MG Tabs tablet Commonly known as: Eliquis Take 1 tablet (2.5 mg total) by mouth 2 (two) times daily for 24 days.   busPIRone 10 MG tablet Commonly known as: BUSPAR Take 1 tablet (10 mg total) by mouth 3 (three) times daily. Replaces alprazolam XR. What changed: additional instructions   cyclobenzaprine 10 MG tablet Commonly known as: FLEXERIL TAKE 1 TABLET BY MOUTH THREE TIMES A DAY AS NEEDED FOR MUSCLE SPASM What changed:  how much to take how to take this when to take this reasons to take this additional instructions   dapagliflozin propanediol 10 MG Tabs tablet Commonly known as: Farxiga Take 1 tablet (10 mg total) by mouth daily.   FLUoxetine 40 MG capsule Commonly known as: PROZAC Take 1 capsule (40 mg total) by mouth daily.   FreeStyle Libre 3 Sensor Misc 1 Device by Does not apply route every 14 (fourteen) days. Place 1 sensor on the skin every 14 days. Use to check glucose continuously   gabapentin 600 MG tablet Commonly known as: NEURONTIN Take 2 tablets (1,200 mg total) by mouth 3 (three) times daily.   ketoconazole 2 % shampoo Commonly known as: NIZORAL APPLY 1 APPLICATION. TOPICALLY 2 (TWO) TIMES A WEEK. What changed: how much to take   Lantus SoloStar 100 UNIT/ML Solostar Pen Generic drug: insulin glargine Inject 30 Units into the skin 2 (two) times  daily. May titrate to 35 units qam and qpm if needed. What changed:  how much to take additional instructions   metoprolol succinate 25 MG 24 hr tablet Commonly known as: TOPROL-XL Take 1 tablet (25 mg total) by mouth daily.   nystatin cream Commonly known as: MYCOSTATIN Apply 1 Application topically 2 (two) times daily as needed for dry skin. What changed:  when to take this reasons to take this   ondansetron 4 MG disintegrating tablet Commonly known as: Zofran ODT Take 1 tablet (4 mg total) by mouth every 8 (eight) hours as needed for nausea or vomiting. What changed: when to take this   oxyCODONE 5 MG immediate release tablet Commonly known as: Oxy IR/ROXICODONE Take 1-2 tablets (5-10 mg total) by mouth every 4 (four) hours as needed for moderate pain or severe pain. What changed:  how much to take when to take this   polyethylene glycol 17 g packet Commonly known as: MIRALAX / GLYCOLAX Take 17  g by mouth daily as needed (No BM in 24-36 hours). What changed: reasons to take this   QUEtiapine 50 MG tablet Commonly known as: SEROQUEL Take 1 tablet (50 mg total) by mouth at bedtime. What changed:  when to take this reasons to take this   senna 8.6 MG Tabs tablet Commonly known as: SENOKOT Take 1 tablet (8.6 mg total) by mouth at bedtime. What changed:  when to take this reasons to take this   tirzepatide 5 MG/0.5ML Pen Commonly known as: MOUNJARO Inject 5 mg into the skin once a week. Increase to 7.5mg  after 4 weeks               Discharge Care Instructions  (From admission, onward)           Start     Ordered   04/21/22 0000  Discharge wound care:       Comments: Plan for staple removal in the office on Monday, April 15   04/21/22 0827            Follow-up Information     Carver Fila, MD Follow up on 05/06/2022.   Specialty: Gynecologic Oncology Why: at 8:45am at the [facility] for post-op check Contact information: [address] [place] [address] [phone]         [facility] Gynecological Oncology Follow up on 04/25/2022.   Specialty: Gynecologic Oncology Why: at 1pm at the [facility] for staple removal Contact information: [address] [id number] [place] [address] [phone]                Greater than thirty minutes were spend for face to face discharge instructions and discharge orders/summary in EPIC.   Signed: Doylene Bode 04/21/2022, 8:32 AM

## 2022-04-21 NOTE — Progress Notes (Signed)
Reviewed written d/c instructions with pt and all questions answered. Pt verbalized understanding. Pt left in stable condition with all belongings.  

## 2022-04-22 ENCOUNTER — Telehealth: Payer: Self-pay

## 2022-04-22 ENCOUNTER — Other Ambulatory Visit: Payer: Self-pay | Admitting: Gynecologic Oncology

## 2022-04-22 DIAGNOSIS — G8918 Other acute postprocedural pain: Secondary | ICD-10-CM

## 2022-04-22 MED ORDER — OXYCODONE HCL 10 MG PO TABS
10.0000 mg | ORAL_TABLET | ORAL | 0 refills | Status: DC | PRN
Start: 2022-04-22 — End: 2022-05-03

## 2022-04-22 NOTE — Transitions of Care (Post Inpatient/ED Visit) (Unsigned)
   04/22/2022  Name: Christine Grant MRN: [id number] DOB: 1975/03/26  Today's TOC FU Call Status: Unsuccessful Call (1st Attempt) Date: 04/22/22  Attempted to reach the patient regarding the most recent Inpatient/ED visit.  Follow Up Plan: Additional outreach attempts will be made to reach the patient to complete the Transitions of Care (Post Inpatient/ED visit) call.   Signature Karena Addison, LPN [facility] Nurse Health Advisor Direct Dial [phone]

## 2022-04-22 NOTE — Telephone Encounter (Signed)
Spoke with Ms. Overturf. She states she is eating, drinking and urinating well. She has had a BM and passing gas. She is taking senokot as prescribed and a capful of Miralax in the am as well. Encouraged her to drink plenty of water. She denies fever or chills. Incisions are dry and intact. She rates her pain 7/10. Her pain is controlled with Oxycodone 10 mg q 4 hours.  Ms Callegari has 2 tabs of Oxycodone 5 mg left. She is concerned to have pain medication for the weekend. Told her that Warner Mccreedy, NP sent in # 36 tabs = 6 day supply of Oxycodone 10 mg. Take 1 tab every 4 hours prn pain.  She can apply ice and heat to abdomen for comfort as well.  Our office is going to call the [facility] on Monday 04-25-22 in the am to get her appointment moved from 06-01-22 to next week. Pt missed appointment 05-20-22 d/t readmission to hospital).  Pt will need to see pain clinic for further refills of narcotics per Dr. Pricilla Holm.  Instructed to call office with any fever, chills, purulent drainage, uncontrolled pain or any other questions or concerns. Patient verbalizes understanding.   Pt aware of post op appointments as well as the office number [phone] and after hours number [phone] to call if she has any questions or concerns

## 2022-04-22 NOTE — Progress Notes (Signed)
See RN note. Patient only has 2 tablets of the 5 mg oxycodones. She has been taking two tablets every four hours as needed, on reg basis.  Patient reports missing her chronic pain medication appointment due to being in the hospital.  Per her husband, the next appointment is at the end of May.  Our office will call first thing on Monday morning to get this rescheduled for hopefully next week.  Per Dr. Pricilla Holm, plan to prescribe 6 days worth of postop pain medicine, oxycodone.

## 2022-04-23 ENCOUNTER — Encounter (HOSPITAL_COMMUNITY): Payer: Self-pay

## 2022-04-23 ENCOUNTER — Emergency Department (HOSPITAL_COMMUNITY)
Admission: EM | Admit: 2022-04-23 | Discharge: 2022-04-23 | Discharge status: Against Medical Advice | Payer: Managed Care, Other (non HMO) | Attending: Emergency Medicine | Admitting: Emergency Medicine

## 2022-04-23 ENCOUNTER — Other Ambulatory Visit: Payer: Self-pay

## 2022-04-23 DIAGNOSIS — R1110 Vomiting, unspecified: Secondary | ICD-10-CM | POA: Diagnosis not present

## 2022-04-23 DIAGNOSIS — Z5321 Procedure and treatment not carried out due to patient leaving prior to being seen by health care provider: Secondary | ICD-10-CM | POA: Diagnosis not present

## 2022-04-23 DIAGNOSIS — R109 Unspecified abdominal pain: Secondary | ICD-10-CM | POA: Insufficient documentation

## 2022-04-23 LAB — CBC
HCT: 39.2 % (ref 36.0–46.0)
Hemoglobin: 12.9 g/dL (ref 12.0–15.0)
MCH: 27.3 pg (ref 26.0–34.0)
MCHC: 32.9 g/dL (ref 30.0–36.0)
MCV: 83.1 fL (ref 80.0–100.0)
Platelets: 418 10*3/uL — ABNORMAL HIGH (ref 150–400)
RBC: 4.72 MIL/uL (ref 3.87–5.11)
RDW: 13.3 % (ref 11.5–15.5)
WBC: 9.3 10*3/uL (ref 4.0–10.5)
nRBC: 0 % (ref 0.0–0.2)

## 2022-04-23 LAB — COMPREHENSIVE METABOLIC PANEL
ALT: 46 U/L — ABNORMAL HIGH (ref 0–44)
AST: 32 U/L (ref 15–41)
Albumin: 3.3 g/dL — ABNORMAL LOW (ref 3.5–5.0)
Alkaline Phosphatase: 105 U/L (ref 38–126)
Anion gap: 10 (ref 5–15)
BUN: 9 mg/dL (ref 6–20)
CO2: 25 mmol/L (ref 22–32)
Calcium: 8.8 mg/dL — ABNORMAL LOW (ref 8.9–10.3)
Chloride: 97 mmol/L — ABNORMAL LOW (ref 98–111)
Creatinine, Ser: 0.5 mg/dL (ref 0.44–1.00)
GFR, Estimated: >60 mL/min (ref >60)
Glucose, Bld: 294 mg/dL — ABNORMAL HIGH (ref 70–99)
Potassium: 3.6 mmol/L (ref 3.5–5.1)
Sodium: 132 mmol/L — ABNORMAL LOW (ref 135–145)
Total Bilirubin: 0.7 mg/dL (ref 0.3–1.2)
Total Protein: 6.5 g/dL (ref 6.5–8.1)

## 2022-04-23 LAB — LIPASE, BLOOD: Lipase: 21 U/L (ref 11–51)

## 2022-04-23 NOTE — ED Triage Notes (Signed)
Pt. Arrives POV c/o abdominal pain and vomiting. Pt. Was recently admitted for a surgical ileus. Pt. States that she started feeling bad at around 7pm. Pt. States that she feel that she has a SBO or surgical ileus again.

## 2022-04-25 ENCOUNTER — Telehealth: Payer: Self-pay | Admitting: General Practice

## 2022-04-25 ENCOUNTER — Inpatient Hospital Stay: Payer: Managed Care, Other (non HMO) | Attending: Gynecologic Oncology

## 2022-04-25 ENCOUNTER — Other Ambulatory Visit: Payer: Self-pay

## 2022-04-25 ENCOUNTER — Telehealth: Payer: Self-pay | Admitting: Surgery

## 2022-04-25 VITALS — BP 133/77 | HR 116 | Temp 97.8°F | Resp 17 | Wt 263.0 lb

## 2022-04-25 DIAGNOSIS — Z90722 Acquired absence of ovaries, bilateral: Secondary | ICD-10-CM

## 2022-04-25 DIAGNOSIS — N838 Other noninflammatory disorders of ovary, fallopian tube and broad ligament: Secondary | ICD-10-CM

## 2022-04-25 DIAGNOSIS — D3911 Neoplasm of uncertain behavior of right ovary: Secondary | ICD-10-CM | POA: Diagnosis not present

## 2022-04-25 DIAGNOSIS — Z9889 Other specified postprocedural states: Secondary | ICD-10-CM | POA: Diagnosis not present

## 2022-04-25 NOTE — Telephone Encounter (Signed)
Per previous note, called [facility] [phone]) to move her appointment up. Per Pain Center, they have not given her any medications since December and she sees them on an as needed basis. They are unable to move her appointment up at this time, as there are no openings, but stated they would put her on a cancellation list. They are also unable to prescribe her any new medication before seeing her. They requested her surgery note be faxed to them at [phone] as well. Advised that Dr Pricilla Holm would be notified of this update and our office will follow up with patient.

## 2022-04-25 NOTE — Progress Notes (Signed)
CTSP due to packing tape in superior aspect on incision. Removed. Small serosanguinous drainage with removal. WTD packing replaced with narrow packing tape and covered with clean dressing. Pt's partner instructed on how to continue with packing removal/replacement daily. He reports familiarity given having to help with wound management with a prior wound.

## 2022-04-25 NOTE — Transitions of Care (Post Inpatient/ED Visit) (Signed)
   04/25/2022  Name: Christine Grant MRN: [id number] DOB: November 10, 1975  Today's TOC FU Call Status: Unsuccessful Call (1st Attempt) Date: 04/25/22  Attempted to reach the patient regarding the most recent Inpatient/ED visit.  Follow Up Plan: Additional outreach attempts will be made to reach the patient to complete the Transitions of Care (Post Inpatient/ED visit) call.   Signature Modesto Charon, RN BSN

## 2022-04-25 NOTE — Progress Notes (Addendum)
Pt came in for staple removal from surgery on 4/3.  Husband was present at appt.   Below performed by Kimberly Swaziland CMA:  Removed tape and gauze with adhesive remover. Staples removed. Steri-strips applied with benzoin. Observed packing strip near navel and notified Dr. Alvester Morin. Packing strip removed by Dr. Alvester Morin and new one placed. Gauze and tegaderm placed over area. Husband educated on self packing from wet to dry. Sent home with supplies for packing. Pt will call back with any concerns.  Aware of post-op appt on 4/26.

## 2022-04-26 NOTE — Transitions of Care (Post Inpatient/ED Visit) (Signed)
   04/26/2022  Name: Zhoe Huxford MRN: [id number] DOB: [birth year]  Today's TOC FU Call Status: Today's TOC FU Call Status:: Successful TOC FU Call Competed Unsuccessful Call (1st Attempt) Date: 04/22/22 [facility] FU Call Complete Date: 04/26/22  Transition Care Management Follow-up Telephone Call Date of Discharge: 04/21/22 Discharge Facility: [facility]) Type of Discharge: Inpatient Admission Primary Inpatient Discharge Diagnosis:: disorder of ovary How have you been since you were released from the hospital?: Better Any questions or concerns?: No  Items Reviewed: Did you receive and understand the discharge instructions provided?: Yes Medications obtained and verified?: Yes (Medications Reviewed) Any new allergies since your discharge?: No Dietary orders reviewed?: Yes Do you have support at home?: Yes People in Home: spouse  Home Care and Equipment/Supplies: Were Home Health Services Ordered?: NA Any new equipment or medical supplies ordered?: NA  Functional Questionnaire: Do you need assistance with bathing/showering or dressing?: Yes Do you need assistance with meal preparation?: Yes Do you need assistance with eating?: No Do you have difficulty maintaining continence: No Do you need assistance with getting out of bed/getting out of a chair/moving?: Yes Do you have difficulty managing or taking your medications?: No  Follow up appointments reviewed: PCP Follow-up appointment confirmed?: NA Specialist Hospital Follow-up appointment confirmed?: Yes Date of Specialist follow-up appointment?: 05/06/22 Follow-Up Specialty Provider:: GYN ONC Do you need transportation to your follow-up appointment?: No Do you understand care options if your condition(s) worsen?: Yes-patient verbalized understanding    SIGNATURE Karena Addison, LPN [facility] Nurse Health Advisor Direct Dial [phone]

## 2022-04-26 NOTE — Transitions of Care (Post Inpatient/ED Visit) (Signed)
   04/26/2022  Name: Christine Grant MRN: [id number] DOB: 01/08/1976  Today's TOC FU Call Status: Unsuccessful Call (1st Attempt) Date: 04/25/22 Unsuccessful Call (2nd Attempt) Date: 04/26/22  Attempted to reach the patient regarding the most recent Inpatient/ED visit.  Follow Up Plan: Additional outreach attempts will be made to reach the patient to complete the Transitions of Care (Post Inpatient/ED visit) call.   Signature Modesto Charon, RN BSN

## 2022-04-27 NOTE — Transitions of Care (Post Inpatient/ED Visit) (Signed)
   04/27/2022  Name: Christine Grant MRN: [id number] DOB: 11-17-1975  Today's TOC FU Call Status: Unsuccessful Call (1st Attempt) Date: 04/25/22 Unsuccessful Call (2nd Attempt) Date: 04/26/22 Unsuccessful Call (3rd Attempt) Date: 04/27/22  Attempted to reach the patient regarding the most recent Inpatient/ED visit.  Follow Up Plan: No further outreach attempts will be made at this time. We have been unable to contact the patient.  Signature Modesto Charon, RN BSN

## 2022-05-02 ENCOUNTER — Other Ambulatory Visit: Payer: Managed Care, Other (non HMO)

## 2022-05-02 ENCOUNTER — Encounter: Payer: Self-pay | Admitting: Family Medicine

## 2022-05-03 ENCOUNTER — Encounter: Payer: Self-pay | Admitting: Gynecologic Oncology

## 2022-05-03 ENCOUNTER — Telehealth: Payer: Self-pay | Admitting: Pharmacist

## 2022-05-03 ENCOUNTER — Other Ambulatory Visit: Payer: Managed Care, Other (non HMO) | Admitting: Pharmacist

## 2022-05-03 NOTE — Progress Notes (Signed)
Attempted to contact patient x2 for scheduled appointment for medication management. Left HIPAA compliant message for patient to return my call at their convenience.   Elide Stalzer, PharmD, BCPS Clinical Pharmacist [facility]  

## 2022-05-03 NOTE — Progress Notes (Unsigned)
05/03/2022 Name: Christine Grant MRN: [id number] DOB: 12/19/1975  No chief complaint on file.   Christine Grant is a 47 y.o. year old female who presented for a telephone visit.   They were referred to the pharmacist by their PCP for assistance in managing diabetes, hypertension, and hyperlipidemia.    Subjective:  Care Team: Primary Care Provider: Everrett Coombe, DO    Medication Access/Adherence  Current Pharmacy:  [phone] - [place], [place] - [address] [place] [address] Phone: [phone] Fax: [phone]  [facility] - [place], [place] - [address] [facility] [address] [place] [address] [place] [address] Phone: [phone] Fax: [phone]   Patient reports affordability concerns with their medications: No , had brief issue when transferring to husbands insurance. Does mention Christine Grant is still $82 with with insurance.  Patient reports access/transportation concerns to their pharmacy: No  Patient reports adherence concerns with their medications:  Yes     Diabetes:  Current medications: lantus 40 units BID, trying to get mounjaro restarted but issues in stock with pharmacy. Recently switched from levemir due to product discontinuation, to lantus, and going well.   Current glucose readings: 167 fasting, 174 currently with breakfast 2 hours ago Using [address] 3 meter; testing continuously   Date of Download: 05/03/22 % Time CGM is active: 70% Average Glucose: 183 mg/dL Glucose Management Indicator: 7.7  Glucose Variability: 28.5 (goal <36%) Time in Goal:  - Time in range 70-180: 52% - Time above range: 48% - Time below range: 0% Observed patterns:   Date of Download: 04/06/22 % Time CGM is active: 13% Average Glucose: 208 mg/dL Glucose Management Indicator: N/A  Glucose Variability: 24.1 (goal <36%) Time in Goal:  - Time in range 70-180: 31% - Time above range: 69% - Time below  range: 0% Observed patterns: data still building, patient just recently began using the sensor   Patient denies hypoglycemic s/sx including dizziness, shakiness, sweating. Patient reports occasional hyperglycemic symptoms including polyuria, polydipsia, polyphagia, nocturia, neuropathy, blurred vision.  Current meal patterns:  - drinks lots of hot tea   Hypertension:  Current medications: metoprolol XL 25mg  daily, mostly for tachycardia. Patient describes white coat hypertension   Patient does not have a validated, automated, upper arm home BP cuff Current blood pressure readings readings: not currently checking  Patient denies hypotensive s/sx including dizziness, lightheadedness.  Patient denies hypertensive symptoms including headache, chest pain, shortness of breath   Hyperlipidemia/ASCVD Risk Reduction  Current lipid lowering medications: none at present, LDL 132 in 2018    Objective:  Lab Results  Component Value Date   HGBA1C 9.9 (H) 04/01/2022    Lab Results  Component Value Date   CREATININE 0.50 04/23/2022   BUN 9 04/23/2022   NA 132 (L) 04/23/2022   K 3.6 04/23/2022   CL 97 (L) 04/23/2022   CO2 25 04/23/2022    Lab Results  Component Value Date   CHOL 248 (A) 07/07/2016   HDL 35 07/07/2016   LDLCALC 132 07/07/2016   TRIG 654 (A) 07/07/2016   CHOLHDL 6.7 (H) 12/23/2014    Medications Reviewed Today     Reviewed by Karena Addison, LPN (Licensed Practical Nurse) on 04/26/22 at 1450  Med List Status: <None>   Medication Order Taking? Sig Documenting Provider Last Dose Status Informant  acetaminophen (TYLENOL) 500 MG tablet [id number] Yes Take 1,000 mg by mouth every 6 (six) hours as needed for moderate pain. [provider] Taking Active Self, Pharmacy Records  apixaban (ELIQUIS) 2.5 MG TABS tablet [id number] Yes Take 1 tablet (2.5 mg total) by mouth 2 (two) times daily for 24 days. Clide Cliff, MD Taking Active Self, Pharmacy Records   busPIRone (BUSPAR) 10 MG tablet [id number] Yes Take 1 tablet (10 mg total) by mouth 3 (three) times daily. Replaces alprazolam XR.  Patient taking differently: Take 10 mg by mouth 3 (three) times daily.   Everrett Coombe, DO Taking Active Self, Pharmacy Records  Continuous Blood Gluc Sensor (FREESTYLE LIBRE 3 SENSOR) [place] [id number] Yes 1 Device by Does not apply route every 14 (fourteen) days. Place 1 sensor on the skin every 14 days. Use to check glucose continuously Everrett Coombe, DO Taking Active Self, Pharmacy Records  cyclobenzaprine (FLEXERIL) 10 MG tablet [id number] Yes TAKE 1 TABLET BY MOUTH THREE TIMES A DAY AS NEEDED FOR MUSCLE SPASM  Patient taking differently: Take 10 mg by mouth as needed for muscle spasms.   Everrett Coombe, DO Taking Active Self, Pharmacy Records  dapagliflozin propanediol (FARXIGA) 10 MG TABS tablet [id number] Yes Take 1 tablet (10 mg total) by mouth daily. Everrett Coombe, DO Taking Active Self, Pharmacy Records  FLUoxetine [facility]) 40 MG capsule [id number] Yes Take 1 capsule (40 mg total) by mouth daily. Everrett Coombe, DO Taking Active Self, Pharmacy Records           Med Note (CRUTHIS, Marcy Siren   Tue Apr 19, 2022  7:48 AM) LF 11/23 for 90 DS. Pt is adamant she is still taking this medication daily. Dispense report does not support this claim.   gabapentin (NEURONTIN) 600 MG tablet [id number] Yes Take 2 tablets (1,200 mg total) by mouth 3 (three) times daily. Everrett Coombe, DO Taking Active Self, Pharmacy Records  insulin glargine (LANTUS SOLOSTAR) 100 UNIT/ML Solostar Pen [id number] Yes Inject 30 Units into the skin 2 (two) times daily. May titrate to 35 units qam and qpm if needed.  Patient taking differently: Inject 40 Units into the skin 2 (two) times daily.   Everrett Coombe, DO Taking Active Self, Pharmacy Records  Insulin Pen Needle (ADVOCATE INSULIN PEN NEEDLES) 33G X 4 MM MISC [id number] Yes 1 each by Does not apply route daily. Everrett Coombe, DO Taking Active  Self, Pharmacy Records  ketoconazole (NIZORAL) 2 % shampoo [id number] Yes APPLY 1 APPLICATION. TOPICALLY 2 (TWO) TIMES A WEEK.  Patient taking differently: Apply 1 Application topically 2 (two) times a week.   Everrett Coombe, DO Taking Active Self, Pharmacy Records  metoprolol succinate (TOPROL-XL) 25 MG 24 hr tablet [id number] Yes Take 1 tablet (25 mg total) by mouth daily. Everrett Coombe, DO Taking Active Self, Pharmacy Records  nystatin cream (MYCOSTATIN) [id number] Yes Apply 1 Application topically 2 (two) times daily as needed for dry skin.  Patient taking differently: Apply 1 Application topically as needed for dry skin (yeast).   Clide Cliff, MD Taking Active Self, Pharmacy Records           Med Note (CRUTHIS, CHLOE C   Tue Apr 19, 2022  7:48 AM) Pt is unaware of last dose.   ondansetron (ZOFRAN ODT) 4 MG disintegrating tablet [id number] Yes Take 1 tablet (4 mg total) by mouth every 8 (eight) hours as needed for nausea or vomiting.  Patient taking differently: Take 4 mg by mouth 2 (two) times daily as needed for nausea or vomiting.   Milas Hock, MD Taking Active Self, Pharmacy Records  Oxycodone HCl 10 MG TABS [id number] Yes Take 1  tablet (10 mg total) by mouth every 4 (four) hours as needed (severe pain). For AFTER surgery pain, do not take and drive, do not take with other pain meds Cross, Melissa D, NP Taking Active   polyethylene glycol (MIRALAX / GLYCOLAX) 17 g packet [id number] Yes Take 17 g by mouth daily as needed (No BM in 24-36 hours).  Patient taking differently: Take 17 g by mouth daily as needed for moderate constipation.   Clide Cliff, MD Taking Active Self, Pharmacy Records  QUEtiapine (SEROQUEL) 50 MG tablet [id number] Yes Take 1 tablet (50 mg total) by mouth at bedtime.  Patient taking differently: Take 50 mg by mouth at bedtime as needed (sleep).   Everrett Coombe, DO Taking Active Self, Pharmacy Records  senna (SENOKOT) 8.6 MG TABS tablet [id number] Yes Take 1 tablet  (8.6 mg total) by mouth at bedtime.  Patient taking differently: Take 1 tablet by mouth 2 (two) times daily as needed for mild constipation.   Clide Cliff, MD Taking Active Self, Pharmacy Records  tirzepatide [facility]) 5 MG/0.5ML Pen [id number] Yes Inject 5 mg into the skin once a week. Increase to 7.5mg  after 4 weeks Everrett Coombe, DO Taking Active Self, Pharmacy Records              Assessment/Plan:   Diabetes: - Currently uncontrolled but patient was out of medications briefly, plans are back in place for glucose control - Reviewed long term cardiovascular and renal outcomes of uncontrolled blood sugar - Reviewed goal A1c, goal fasting, and goal 2 hour post prandial glucose - Reviewed dietary modifications including prioritizing protein, limiting portion sizes of carbs, opting for vegetables, and lastly fruit in moderation if noticing sugars elevate with fruit intake - Recommend to continue current regimen, advised patient to titrate insulin units by 4 units every 2-3 days if noticing BG > 180 as she will be temporarily stopping medications to prep for surgery - Recommend to check glucose using CGM   Hypertension: - Currently unknown control - Reviewed long term cardiovascular and renal outcomes of uncontrolled blood pressure - Counseled patient to purchase "omron" brand upper arm BP cuff for home monitoring with goal of 130/80 or lower - Recommend to continue current regimen      Hyperlipidemia/ASCVD Risk Reduction: - Currently unknown control, due for lipid panel repeat (last labs 2018) - Reviewed long term complications of uncontrolled cholesterol - Recommend to repeat lipid panel (lab order is available from 12/2021) and consider statin in future  Follow Up Plan: 1-2 months  Lynnda Shields, PharmD Clinical Pharmacist [facility] [phone]

## 2022-05-05 NOTE — Progress Notes (Signed)
Gynecologic Oncology Return Clinic Visit  05/06/22  Reason for Visit: follow-up after surgery, treatment planning  Treatment History: Patient was initially seen in our clinic in 2015-2016 in the setting of chronic pelvic pain, narcotic dependency, and desire for surgical intervention.  She was found to have a thickened endometrial lining and given risk factors for endometrial hyperplasia and malignancy, endometrial sampling was recommended.  She underwent D&C on 02/13/2014 with final pathology revealing benign secretory endometrium.  Her pain was very difficult to manage postoperatively as described in Dr. Oliver Hum note from 02/28/2014.   Her subsequent treatment history is as noted below from most recent 2 oldest.  Shortly after her visit last in our clinic, she underwent robotic hysterectomy. Other surgeries since have included left salpingo-oophorectomy for enlarged ovary and concern for torsion, as well as appendectomy.  At the time of both surgeries, extensive pelvic adhesions were noted in both surgeries required conversion from laparoscopy to open.   2016: Robotic hysterectomy for abnormal uterine bleeding and chronic pelvic pain.  Per patient report, extensive adhesions were noted at that time as well as likely endometriosis.  06/2017: Patient underwent exploratory laparoscopy converted to exploratory laparotomy, lysis of adhesion, cystoscopy, left salpingo-oophorectomy, and sigmoidoscopy in the setting of abdominal pain and concern for ovarian torsion.  Procedure was complicated by dense adhesive disease requiring conversion to open procedure with an estimated blood loss of 300 cc.  At the time of presentation, patient was noted to have an enlarged 6.6 cm left ovary with both imaging and exam findings concerning for torsion.  Findings at the time of surgery included omental and small bowel adhesions to the anterior abdominal wall.  Sigmoid colon was draped over the left ovary.  Right ovary noted to  be normal in appearance and adherent to the sidewall.  Pathology from surgery showed hemorrhagic cyst and intraparenchymal ovarian hemorrhage consistent with ischemic changes.  Benign ovary. 02/04/2018: Pelvic ultrasound shows right ovary measures 7.8 x 6.4 x 5.9 cm with a 6.4 x 5.8 x 4.7 cm cyst containing multiple thin internal septations and membranes with low-level internal echoes.  Cyst thought to most likely represent a hemorrhagic cyst or endometrioma. 06/15/2019: CT of the abdomen and pelvis shows borderline dilation of the tip of the appendix measuring 8 mm with very minimal inflammation.  Ill-defined 1.5 cm hypoattenuating lesion in the posterior segment 4 of the liver.  Chronic scarring along the anterior bladder and in the soft tissues of the low anterior pelvic wall. 06/16/2019: Pelvic ultrasound exam reveals a right ovary measuring 3.1 x 2.3 x 2.6 cm with a small anechoic follicle measuring 1.2 cm.  Previously seen hemorrhagic cyst noted to have involuted. 06/2019: Patient presented with several months of right lower quadrant pain with episodes of nausea.  Given concern for early appendicitis, patient was taken to the operating room for an appendectomy.  This was a laparoscopic procedure converted to open secondary to significant adhesive disease.  Findings at the time of surgery included a enlarged body and tip of the appendix.  Postoperative course was complicated by delayed healing of incision. 03/06/2020: Pelvic ultrasound reveals right ovary measures 3.3 x 3.2 x 2.6 cm with a 1.8 cm dominant follicle. 06/28/2020: Pelvic ultrasound exam at [facility] shows right ovary measuring 3.9 x 2.7 x 2.8 cm with a 1.4 cm simple appearing cyst. 10/30/2020: Pelvic MRI at [facility] shows right ovary measuring 3.5 x 1.8 x 2 cm.  Multiple follicles present.  Within the inferior aspect of the  ovary, there is an involuting ovarian cyst with typical peripheral enhancement measuring 2 cm.  Within the superior  aspect, there is a T1/T2 hypointense lesion measuring 1.8 x 2 cm, demonstrating diffuse enhancement postcontrast.  Nodule appears to be within the ovary but closely abuts the cecum.  In retrospect review of prior CT scans, this nodule appears to have been present since 2020 and slightly hyperdense as compared to surrounding ovarian tissue. 12/17/2020: CT of the abdomen and pelvis shows enhancing area within the right ovary, possibly due to endometrioma, hemorrhagic cyst, or mass.  This is measured to be at least 2.3 x 1.5 cm. 12/17/2020: Pelvic ultrasound exam at [facility].  Right ovary measures 3.5 x 3.2 x 3.2 cm.  Mildly hypoechoic mass within the right ovary measures 1.8 x 1.6 x 1.7 cm and has associated Doppler flow.  Not significantly changed in size compared to October 2022 MRI. 02/17/2021: Pelvic ultrasound exam at [facility] shows right ovary measuring 3.5 x 2.7 x 3.1 cm.  Small lesion measuring 1.7 x 1.6 x 1.3 cm which does appear to have small amount of internal flow or possible artifact.  Impression is that there is a more solid-appearing lesion on the right ovary compared to 2 cyst like structures on prior examinations.  The cyst could represent hemorrhagic complex cyst or proteinaceous ovarian cyst.  Low degree suspicion for neoplasm considering waxing and waning appearance of the cyst on multiple prior exams.   CEA on 10/28/2020: 3.1   Patient was seen most recently by Dr. Para March on 1/27 in the setting of continued right lower quadrant pain. Most recent pelvic ultrasound exam in our system on 2/25 shows right ovary measures 3.3 x 3.2 x 2.6 cm with a 1.8 cm dominant follicle in the ovary.  No suspicious masses.   See was started on [place] in early 2023.    Pelvic ultrasound in 05/2021 revealed right ovary measures 3.4 x 3.1 x 3 cm.  Previous ovarian cyst no longer seen.  Within the right ovary is a suggestion of a 2.7 cm echogenic solid lesion with vascularity.   CT of the abdomen and  pelvis on 06/24/2021 reveals no acute abnormality seen.     MRI of the pelvis on 06/30/2021 reveals a 2.5 cm solid homogeneously enhancing right ovarian mass.  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   MRI of the pelvis on 12/28/2021 shows a solid right enhancing ovarian lesion within 1 mm of measurements from June 2023 but enlarged since October 2022.  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  No ascites or peritoneal nodularity, no adenopathy.  Postoperative changes along the lower abdominal wall with thinning or absence of the right rectus muscle and laxity of the abdominal wall is similar to prior imaging.   Pelvic ultrasound at [facility] on 03/01/2022 reveals an isoechoic area within the right ovary measuring up to 2.4 cm, thought to perhaps represent a hemorrhagic cyst.   CT of the abdomen and pelvis at [facility] on 03/21/2022 shows 3 cm solid enhancing lesion of the right adnexa, unchanged from prior study.   Pelvic ultrasound on 04/02/2022 at [facility] reveals the right ovary measures 4 x 2.8 x 3.7 cm.  No mass or fluid collection noted.  04/13/22: Diagnostic laparoscopy with conversion to exploratory laparotomy, lysis of adhesions and enterolysis for approximately 2.5 hours, right salpingo-oophorectomy, resection of two segments of small bowel with side to  side reanastomosis, resection of suspected small bowel diverticula, omental biopsy   Interval History: Patient reports doing very well.  She has been off of anything other than Tylenol for abdominal pain for over a week.  She endorses regular bowel function, now not needing any medications.  Tolerating regular diet without nausea or emesis.  Voiding without issue.  Still packing small opening in her incision.  Past Medical/Surgical History: Past Medical History:  Diagnosis Date   Abnormal uterine bleeding    Anxiety    Arthritis    knees    Chronic pelvic pain in female    Degenerative joint disease    back   Depression    Endometriosis of pelvis    Fibromyalgia    History of ketoacidosis    01-15-2014   PONV (postoperative nausea and vomiting)    Sinus tachycardia    on metoprolol   Type 2 diabetes mellitus (HCC)    Wears contact lenses     Past Surgical History:  Procedure Laterality Date   ABDOMINAL HYSTERECTOMY     APPENDECTOMY     BOWEL RESECTION  04/13/2022   Procedure: SMALL BOWEL RESECTION;  Surgeon: Carver Fila, MD;  Location: [facility];  Service: Gynecology;;   BREAST REDUCTION SURGERY  age 50   CESAREAN SECTION  2002,  2005,  2008   DILATION AND CURETTAGE OF UTERUS N/A 02/13/2014   Procedure: DILATATION AND CURETTAGE;  Surgeon: Adolphus Birchwood, MD;  Location: [facility];  Service: Gynecology;  Laterality: N/A;   EXCISION OF ADNEXAL MASS Right 04/13/2022   Procedure: EXCISION OF ADNEXAL MASS;  Surgeon: Carver Fila, MD;  Location: [facility];  Service: Gynecology;  Laterality: Right;   LAPAROSCOPY N/A 04/13/2022   Procedure: LAPAROSCOPY DIAGNOSTIC;  Surgeon: Carver Fila, MD;  Location: [facility];  Service: Gynecology;  Laterality: N/A;   LEFT OOPHORECTOMY  2019   LYSIS OF ADHESION  04/13/2022   Procedure: LYSIS OF ADHESION;  Surgeon: Carver Fila, MD;  Location: [facility];  Service: Gynecology;;   MANDIBLE SURGERY  age 35   Correct overbite   REDUCTION MAMMAPLASTY      Family History  Adopted: Yes  Problem Relation Age of Onset   Hyperlipidemia Mother    Hypertension Mother    Diabetes Mother    Breast cancer Maternal Aunt    Endometriosis Maternal Aunt    Colon cancer Neg Hx    Pancreatic cancer Neg Hx    Ovarian cancer Neg Hx    Prostate cancer Neg Hx     Social History   Socioeconomic History   Marital status: Married    Spouse name: Not on file   Number of children: Not on file   Years of education: Not on file   Highest education level: Not on file   Occupational History   Occupation: works in medical billing  Tobacco Use   Smoking status: Never   Smokeless tobacco: Never  Vaping Use   Vaping Use: Never used  Substance and Sexual Activity   Alcohol use: No   Drug use: No   Sexual activity: Yes    Birth control/protection: Surgical  Other Topics Concern   Not on file  Social History Narrative   Not on file   Social Determinants of Health   Financial Resource Strain: Low Risk  (04/06/2022)   Overall Financial Resource Strain (CARDIA)    Difficulty of Paying Living Expenses: Not very hard  Food Insecurity: No Food Insecurity (  04/19/2022)   Hunger Vital Sign    Worried About Running Out of Food in the Last Year: Never true    Ran Out of Food in the Last Year: Never true  Transportation Needs: No Transportation Needs (04/19/2022)   PRAPARE - Administrator, Civil Service (Medical): No    Lack of Transportation (Non-Medical): No  Physical Activity: Not on file  Stress: Not on file  Social Connections: Not on file    Current Medications:  Current Outpatient Medications:    acetaminophen (TYLENOL) 500 MG tablet, Take 1,000 mg by mouth every 6 (six) hours as needed for moderate pain., Disp: , Rfl:    apixaban (ELIQUIS) 2.5 MG TABS tablet, Take 1 tablet (2.5 mg total) by mouth 2 (two) times daily for 24 days., Disp: 48 tablet, Rfl: 0   busPIRone (BUSPAR) 10 MG tablet, Take 1 tablet (10 mg total) by mouth 3 (three) times daily. Replaces alprazolam XR. (Patient taking differently: Take 10 mg by mouth 3 (three) times daily.), Disp: 270 tablet, Rfl: 2   Continuous Blood Gluc Sensor (FREESTYLE LIBRE 3 SENSOR) MISC, 1 Device by Does not apply route every 14 (fourteen) days. Place 1 sensor on the skin every 14 days. Use to check glucose continuously, Disp: 2 each, Rfl: 6   cyclobenzaprine (FLEXERIL) 10 MG tablet, TAKE 1 TABLET BY MOUTH THREE TIMES A DAY AS NEEDED FOR MUSCLE SPASM (Patient taking differently: Take 10 mg by mouth  as needed for muscle spasms.), Disp: 270 tablet, Rfl: 1   dapagliflozin propanediol (FARXIGA) 10 MG TABS tablet, Take 1 tablet (10 mg total) by mouth daily., Disp: 90 tablet, Rfl: 3   FLUoxetine (PROZAC) 40 MG capsule, Take 1 capsule (40 mg total) by mouth daily., Disp: 90 capsule, Rfl: 1   gabapentin (NEURONTIN) 600 MG tablet, Take 2 tablets (1,200 mg total) by mouth 3 (three) times daily., Disp: 540 tablet, Rfl: 0   insulin glargine (LANTUS SOLOSTAR) 100 UNIT/ML Solostar Pen, Inject 30 Units into the skin 2 (two) times daily. May titrate to 35 units qam and qpm if needed. (Patient taking differently: Inject 40 Units into the skin 2 (two) times daily.), Disp: 60 mL, Rfl: 1   Insulin Pen Needle (ADVOCATE INSULIN PEN NEEDLES) 33G X 4 MM MISC, 1 each by Does not apply route daily., Disp: 100 each, Rfl: 12   ketoconazole (NIZORAL) 2 % shampoo, APPLY 1 APPLICATION. TOPICALLY 2 (TWO) TIMES A WEEK. (Patient taking differently: Apply 1 Application topically 2 (two) times a week.), Disp: 120 mL, Rfl: 0   metoprolol succinate (TOPROL-XL) 25 MG 24 hr tablet, Take 1 tablet (25 mg total) by mouth daily., Disp: 90 tablet, Rfl: 1   nystatin cream (MYCOSTATIN), Apply 1 Application topically 2 (two) times daily as needed for dry skin. (Patient taking differently: Apply 1 Application topically as needed for dry skin (yeast).), Disp: , Rfl:    ondansetron (ZOFRAN ODT) 4 MG disintegrating tablet, Take 1 tablet (4 mg total) by mouth every 8 (eight) hours as needed for nausea or vomiting. (Patient taking differently: Take 4 mg by mouth 2 (two) times daily as needed for nausea or vomiting.), Disp: 20 tablet, Rfl: 0   polyethylene glycol (MIRALAX / GLYCOLAX) 17 g packet, Take 17 g by mouth daily as needed (No BM in 24-36 hours). (Patient taking differently: Take 17 g by mouth daily as needed for moderate constipation.), Disp: 14 each, Rfl: 0   QUEtiapine (SEROQUEL) 50 MG tablet, Take 1 tablet (50  mg total) by mouth at bedtime.  (Patient taking differently: Take 50 mg by mouth at bedtime as needed (sleep).), Disp: 90 tablet, Rfl: 2   senna (SENOKOT) 8.6 MG TABS tablet, Take 1 tablet (8.6 mg total) by mouth at bedtime. (Patient taking differently: Take 1 tablet by mouth 2 (two) times daily as needed for mild constipation.), Disp: 14 tablet, Rfl: 0   tirzepatide (MOUNJARO) 5 MG/0.5ML Pen, Inject 5 mg into the skin once a week. Increase to 7.5mg  after 4 weeks, Disp: 2 mL, Rfl: 0  Review of Systems: Denies appetite changes, fevers, chills, fatigue, unexplained weight changes. Denies hearing loss, neck lumps or masses, mouth sores, ringing in ears or voice changes. Denies cough or wheezing.  Denies shortness of breath. Denies chest pain or palpitations. Denies leg swelling. Denies abdominal distention, pain, blood in stools, constipation, diarrhea, nausea, vomiting, or early satiety. Denies pain with intercourse, dysuria, frequency, hematuria or incontinence. Denies hot flashes, pelvic pain, vaginal bleeding or vaginal discharge.   Denies joint pain, back pain or muscle pain/cramps. Denies itching, rash, or wounds. Denies dizziness, headaches, numbness or seizures. Denies swollen lymph nodes or glands, denies easy bruising or bleeding. Denies anxiety, depression, confusion, or decreased concentration.  Physical Exam: BP 139/73 (BP Location: Left Arm, Patient Position: Sitting)   Pulse (!) 135 Comment: MD notfied.  Temp 97.8 F (36.6 C) (Oral)   Resp 18   Ht 5\' 6"  (1.676 m)   Wt 257 lb 3 oz (116.7 kg)   LMP 01/14/2014   SpO2 100%   BMI 41.51 kg/m  General: Alert, oriented, no acute distress. HEENT: Normocephalic, atraumatic, sclera anicteric. Chest: Unlabored breathing on room air. Abdomen: Obese, soft, nontender.  Normoactive bowel sounds.  No masses or hepatosplenomegaly appreciated.  Well-healed incision.  There is a pinpoint opening at the superior aspect of the incision with packing in place.  This was  removed.  Q-tip is too big to fit through the incision but the wooden end of a Q-tip is able to be placed approximately 1-2 cm.  No erythema, exudate, or induration. Extremities: Grossly normal range of motion.  Warm, well perfused.  No edema bilaterally.  Laboratory & Radiologic Studies: Cytology FINAL MICROSCOPIC DIAGNOSIS:  - No malignant cells identified  - Benign reactive/reparative changes   A. OVARY AND FALLOPIAN TUBE, RIGHT, SALPINGO OOPHORECTOMY: Granulosa cell tumor, adult type, 2.8 cm. Negative for ovarian surface involvement. See oncology table and comment.  B. SMALL BOWEL, RESECTION: Benign small intestine with serosal adhesions. Negative for tumor.  C. OMENTUM, BIOPSY: Benign fibroadipose tissue. Negative for tumor.  ONCOLOGY TABLE: OVARY or FALLOPIAN TUBE or PRIMARY PERITONEUM: Resection Procedure: Salpingo-oophorectomy Specimen Integrity: Intact Tumor Site: Right ovary Tumor Size: 2.8 x 2.3 x 2 cm Histologic Type: Adult type granulosa cell tumor Histologic Grade: Not applicable Ovarian Surface Involvement: Not identified Fallopian Tube Surface Involvement: No fallopian tube present Implants: Not applicable Lymphatic and/or Vascular Invasion: Not identified Other Tissue/ Organ Involvement: Not identified Largest Extrapelvic Peritoneal Focus: Not applicable Peritoneal/Ascitic Fluid Involvement: Benign ([facility] [id number]) Chemotherapy Response Score (CRS): Not applicable Regional Lymph Nodes: No lymph nodes submitted Distant Metastasis:      Distant Site(s) Involved: Not applicable Pathologic Stage Classification (pTNM, AJCC 8th Edition): pT1a, pN not assigned Ancillary Studies: Immunohistochemistry, see comment Representative Tumor Block: A2-A5 Comment(s): The tumor is vaguely lobular and composed of sheets of cells alternating with tubules and rosettes with slightly elongated ovoid nuclei with occasional nuclear grooves.  Immunohistochemistry shows diffuse  strong positivity with  inhibin, calretinin and CD56 with negative staining with chromogranin, synaptophysin, estrogen receptor and PAX8.  Progesterone receptor shows patchy, focal positivity.  The morphology and immunophenotype are consistent with adult type granulosa cell tumor. (v1.3.0.1)  INTRAOPERATIVE DIAGNOSIS: A. OVARY, RIGHT: "Sex cord stromal tumor, morphologically favor granulosa cell tumor." Intraoperative diagnosis rendered by Dr. Kenyon Ana at 11:16 on 13 April 2022.   Assessment & Plan: Christine Grant is a 48 y.o. woman with Stage IA adult granulosa cell tumor who presents for follow-up, treatment planning.  The patient is doing well from a postoperative standpoint.  Discussed continued expectations and restrictions.  I encouraged the patient and her husband to continue packing the very small incisional defect, which I think they will only be able to do for the next week or so.  The patient was tachycardic, which she has been at prior clinic visits and during her hospital stay. She has a long history of tachycardia. CT angio was performed postop to rule out pulmonary embolism (negative).  I reviewed her pathology report again from surgery.  The patient was given a copy of her pathology report.  Plan to get inhibin B and repeat testosterone.  Given early stage, low risk disease, we discussed surveillance visits every 6 months initially.  I reviewed signs and symptoms that would be concerning for recurrence of her granulosa cell tumor and stressed the importance of calling if she develops any of these between visits.  20 minutes of total time was spent for this patient encounter, including preparation, face-to-face counseling with the patient and coordination of care, and documentation of the encounter.  Eugene Garnet, MD  Division of Gynecologic Oncology  Department of Obstetrics and Gynecology  [facility]

## 2022-05-06 ENCOUNTER — Encounter: Payer: Self-pay | Admitting: Gynecologic Oncology

## 2022-05-06 ENCOUNTER — Inpatient Hospital Stay: Payer: Managed Care, Other (non HMO) | Admitting: Gynecologic Oncology

## 2022-05-06 ENCOUNTER — Inpatient Hospital Stay (HOSPITAL_BASED_OUTPATIENT_CLINIC_OR_DEPARTMENT_OTHER): Payer: Managed Care, Other (non HMO) | Admitting: Gynecologic Oncology

## 2022-05-06 VITALS — BP 139/73 | HR 100 | Temp 97.8°F | Resp 18 | Ht 66.0 in | Wt 257.2 lb

## 2022-05-06 DIAGNOSIS — D3910 Neoplasm of uncertain behavior of unspecified ovary: Secondary | ICD-10-CM

## 2022-05-06 DIAGNOSIS — N83299 Other ovarian cyst, unspecified side: Secondary | ICD-10-CM

## 2022-05-06 DIAGNOSIS — Z9889 Other specified postprocedural states: Secondary | ICD-10-CM

## 2022-05-06 DIAGNOSIS — D3911 Neoplasm of uncertain behavior of right ovary: Secondary | ICD-10-CM

## 2022-05-06 DIAGNOSIS — Z7189 Other specified counseling: Secondary | ICD-10-CM

## 2022-05-06 NOTE — Patient Instructions (Signed)
You are healing well from surgery.  Please remember, no heavy lifting for at least 6 weeks after surgery.  I will see you for follow-up in 6 months.  If you develop any concerning symptoms such as pelvic pain, change to bowel function, or unintentional weight loss, please call to see me sooner.

## 2022-05-09 LAB — INHIBIN B: Inhibin B: <7 pg/mL

## 2022-05-10 ENCOUNTER — Telehealth: Payer: Self-pay | Admitting: Pharmacist

## 2022-05-10 ENCOUNTER — Other Ambulatory Visit: Payer: Managed Care, Other (non HMO) | Admitting: Pharmacist

## 2022-05-10 NOTE — Progress Notes (Signed)
Attempted to contact patient x2 for scheduled appointment for medication management. Left HIPAA compliant message for patient to return my call at their convenience.   Faelynn Wynder, PharmD, BCPS Clinical Pharmacist [facility]  

## 2022-05-12 LAB — TESTOSTERONE, FREE, TOTAL, SHBG
Sex Hormone Binding: 35.6 nmol/L (ref 24.6–122.0)
Testosterone, Free: 1.6 pg/mL (ref 0.0–4.2)
Testosterone: 17 ng/dL (ref 4–50)

## 2022-05-30 ENCOUNTER — Encounter: Payer: Self-pay | Admitting: Family Medicine

## 2022-05-31 ENCOUNTER — Encounter: Payer: Self-pay | Admitting: Family Medicine

## 2022-05-31 ENCOUNTER — Ambulatory Visit: Payer: Managed Care, Other (non HMO) | Admitting: Family Medicine

## 2022-05-31 VITALS — BP 118/78 | HR 115 | Ht 66.0 in | Wt 253.0 lb

## 2022-05-31 DIAGNOSIS — E1169 Type 2 diabetes mellitus with other specified complication: Secondary | ICD-10-CM | POA: Diagnosis not present

## 2022-05-31 DIAGNOSIS — E669 Obesity, unspecified: Secondary | ICD-10-CM

## 2022-05-31 DIAGNOSIS — D179 Benign lipomatous neoplasm, unspecified: Secondary | ICD-10-CM

## 2022-05-31 HISTORY — DX: Benign lipomatous neoplasm, unspecified: D17.9

## 2022-05-31 NOTE — Progress Notes (Signed)
Christine Grant - 47 y.o. female MRN [id number]  Date of birth: [birth year]  Subjective Chief Complaint  Patient presents with   Diabetes   Mass    HPI Christine Grant is a 47 year old female here today for follow-up visit.  She reports her blood sugars are looking much better.  Her estimated A1c based on her CGM is around 6.9%.  She did run out of insulin recently and is awaiting prior authorization.  She is requesting a sample to hold her over until she is able to get this.  She is having increased pain and the lipoma on her leg as well as a lipoma over her ribs.  She would like to have a referral for surgery to have this removed.  ROS:  A comprehensive ROS was completed and negative except as noted per HPI  Allergies  Allergen Reactions   Toradol [Ketorolac Tromethamine] Other (See Comments)    Chest tightness, flushed     Past Medical History:  Diagnosis Date   Abnormal uterine bleeding    Anxiety    Arthritis    knees   Chronic pelvic pain in female    Degenerative joint disease    back   Depression    Endometriosis of pelvis    Fibromyalgia    History of ketoacidosis    01-15-2014   PONV (postoperative nausea and vomiting)    Sinus tachycardia    on metoprolol   Type 2 diabetes mellitus (HCC)    Wears contact lenses     Past Surgical History:  Procedure Laterality Date   ABDOMINAL HYSTERECTOMY     APPENDECTOMY     BOWEL RESECTION  04/13/2022   Procedure: SMALL BOWEL RESECTION;  Surgeon: Carver Fila, MD;  Location: [facility];  Service: Gynecology;;   BREAST REDUCTION SURGERY  age 53   CESAREAN SECTION  2002,  2005,  2008   DILATION AND CURETTAGE OF UTERUS N/A 02/13/2014   Procedure: DILATATION AND CURETTAGE;  Surgeon: Adolphus Birchwood, MD;  Location: [facility];  Service: Gynecology;  Laterality: N/A;   EXCISION OF ADNEXAL MASS Right 04/13/2022   Procedure: EXCISION OF ADNEXAL MASS;  Surgeon: Carver Fila, MD;  Location: [facility];  Service:  Gynecology;  Laterality: Right;   LAPAROSCOPY N/A 04/13/2022   Procedure: LAPAROSCOPY DIAGNOSTIC;  Surgeon: Carver Fila, MD;  Location: [facility];  Service: Gynecology;  Laterality: N/A;   LEFT OOPHORECTOMY  2019   LYSIS OF ADHESION  04/13/2022   Procedure: LYSIS OF ADHESION;  Surgeon: Carver Fila, MD;  Location: [facility];  Service: Gynecology;;   MANDIBLE SURGERY  age 70   Correct overbite   REDUCTION MAMMAPLASTY      Social History   Socioeconomic History   Marital status: Married    Spouse name: Not on file   Number of children: Not on file   Years of education: Not on file   Highest education level: Not on file  Occupational History   Occupation: works in medical billing  Tobacco Use   Smoking status: Never   Smokeless tobacco: Never  Vaping Use   Vaping Use: Never used  Substance and Sexual Activity   Alcohol use: No   Drug use: No   Sexual activity: Yes    Birth control/protection: Surgical  Other Topics Concern   Not on file  Social History Narrative   Not on file   Social Determinants of Health   Financial Resource Strain: Low Risk  (04/06/2022)  Overall Financial Resource Strain (CARDIA)    Difficulty of Paying Living Expenses: Not very hard  Food Insecurity: No Food Insecurity (04/19/2022)   Hunger Vital Sign    Worried About Running Out of Food in the Last Year: Never true    Ran Out of Food in the Last Year: Never true  Transportation Needs: No Transportation Needs (04/19/2022)   PRAPARE - Administrator, Civil Service (Medical): No    Lack of Transportation (Non-Medical): No  Physical Activity: Not on file  Stress: Not on file  Social Connections: Not on file    Family History  Adopted: Yes  Problem Relation Age of Onset   Hyperlipidemia Mother    Hypertension Mother    Diabetes Mother    Breast cancer Maternal Aunt    Endometriosis Maternal Aunt    Colon cancer Neg Hx    Pancreatic cancer Neg Hx    Ovarian cancer Neg Hx     Prostate cancer Neg Hx     Health Maintenance  Topic Date Due   OPHTHALMOLOGY EXAM  Never done   Hepatitis C Screening  12/15/2022 (Originally 08/02/1994)   HIV Screening  12/15/2022 (Originally 08/02/1991)   COVID-19 Vaccine (3 - 2023-24 season) 03/31/2023 (Originally 09/10/2021)   INFLUENZA VACCINE  08/11/2022   HEMOGLOBIN A1C  10/02/2022   Diabetic kidney evaluation - Urine ACR  12/15/2022   Diabetic kidney evaluation - eGFR measurement  04/23/2023   FOOT EXAM  05/31/2023   COLONOSCOPY (Pts 45-63yrs Insurance coverage will need to be confirmed)  10/30/2027   DTaP/Tdap/Td (3 - Td or Tdap) 01/27/2032   HPV VACCINES  Aged Out     ----------------------------------------------------------------------------------------------------------------------------------------------------------------------------------------------------------------- Physical Exam BP 118/78 (BP Location: Left Arm, Patient Position: Sitting, Cuff Size: Large)   Pulse (!) 115   Ht 5\' 6"  (1.676 m)   Wt 253 lb (114.8 kg)   LMP 01/14/2014   SpO2 98%   BMI 40.84 kg/m   Physical Exam Constitutional:      Appearance: Normal appearance.  HENT:     Head: Normocephalic and atraumatic.  Eyes:     General: No scleral icterus. Neurological:     Mental Status: She is alert.  Psychiatric:        Mood and Affect: Mood normal.        Behavior: Behavior normal.     ------------------------------------------------------------------------------------------------------------------------------------------------------------------------------------------------------------------- Assessment and Plan  Type 2 diabetes mellitus with obesity (HCC) Her diabetes has been better controlled.  She will continue current medications and monitoring with CGM.  Provided with sample of Lantus today.  Lipoma She is having increased pain from lipoma.  Referral placed to general surgery.   No orders of the defined types were placed in this  encounter.   No follow-ups on file.    This visit occurred during the SARS-CoV-2 public health emergency.  Safety protocols were in place, including screening questions prior to the visit, additional usage of staff PPE, and extensive cleaning of exam room while observing appropriate contact time as indicated for disinfecting solutions.

## 2022-05-31 NOTE — Assessment & Plan Note (Addendum)
Her diabetes has been better controlled.  She will continue current medications and monitoring with CGM.  Provided with sample of Lantus today.

## 2022-05-31 NOTE — Assessment & Plan Note (Signed)
She is having increased pain from lipoma.  Referral placed to general surgery.

## 2022-06-16 ENCOUNTER — Encounter: Payer: Self-pay | Admitting: Family Medicine

## 2022-06-18 ENCOUNTER — Other Ambulatory Visit: Payer: Self-pay | Admitting: Family Medicine

## 2022-06-23 ENCOUNTER — Other Ambulatory Visit: Payer: Self-pay | Admitting: Family Medicine

## 2022-07-14 ENCOUNTER — Other Ambulatory Visit: Payer: Self-pay | Admitting: Family Medicine

## 2022-07-15 ENCOUNTER — Ambulatory Visit: Payer: Managed Care, Other (non HMO) | Admitting: Family Medicine

## 2022-08-11 ENCOUNTER — Telehealth: Payer: Self-pay

## 2022-08-11 ENCOUNTER — Ambulatory Visit: Payer: Managed Care, Other (non HMO) | Admitting: Family Medicine

## 2022-08-11 VITALS — BP 135/87 | HR 112 | Ht 66.0 in | Wt 257.0 lb

## 2022-08-11 DIAGNOSIS — I152 Hypertension secondary to endocrine disorders: Secondary | ICD-10-CM | POA: Diagnosis not present

## 2022-08-11 DIAGNOSIS — E669 Obesity, unspecified: Secondary | ICD-10-CM

## 2022-08-11 DIAGNOSIS — E1159 Type 2 diabetes mellitus with other circulatory complications: Secondary | ICD-10-CM

## 2022-08-11 DIAGNOSIS — F411 Generalized anxiety disorder: Secondary | ICD-10-CM | POA: Diagnosis not present

## 2022-08-11 DIAGNOSIS — E1169 Type 2 diabetes mellitus with other specified complication: Secondary | ICD-10-CM

## 2022-08-11 DIAGNOSIS — Z7984 Long term (current) use of oral hypoglycemic drugs: Secondary | ICD-10-CM

## 2022-08-11 LAB — POCT GLYCOSYLATED HEMOGLOBIN (HGB A1C): HbA1c, POC (controlled diabetic range): 7.1 % — AB (ref 0.0–7.0)

## 2022-08-11 MED ORDER — TIRZEPATIDE 10 MG/0.5ML ~~LOC~~ SOAJ: 10.0000 mg | SUBCUTANEOUS | 1 refills | Status: DC

## 2022-08-11 MED ORDER — HYDROXYZINE PAMOATE 25 MG PO CAPS: 25.0000 mg | ORAL_CAPSULE | Freq: Three times a day (TID) | ORAL | 1 refills | Status: DC | PRN

## 2022-08-11 NOTE — Patient Instructions (Addendum)
Increase Mounjaro to 10mg  weekly.  Try hydroxyzine as needed.  See me again in 4 months.

## 2022-08-11 NOTE — Telephone Encounter (Signed)
Niyana Finstad (Key: [id number])  FARXIGA 10MG  PA Case ID #: [id number] Rx #: [id number] Approved today by [place] [id number]; Status:Approved; Prior Auth;Coverage Start Date:07/12/2022;Coverage End Date:08/11/2023; Authorization Expiration Date: 08/10/2023

## 2022-08-11 NOTE — Progress Notes (Signed)
Christine Grant - 47 y.o. female MRN [id number]  Date of birth: 04-14-1975  Subjective Chief Complaint  Patient presents with   Diabetes   Hypertension    HPI Christine Grant is a 47 y.o. female here today for follow up visit.   She reports that she is feeling well.   She continues on mounjaro weekly, farxiga and lantus at  30 units bid.  Denies hypoglycemia.  A1c improved to 7.1% today.   BP is well controlled.  She continues with metoprolol.  She denies side effects at current strength.  She has not had chest pain, shortness of breath, palpitations, headache or vision changes.     Continues to see pain management which is going well however she also still have quite a bit of breakthrough anxiety.  She continues on fluoxetine and buspar.  She also has seroquel at night.  She was on alprazolam previously but can't use this with opioids.    ROS:  A comprehensive ROS was completed and negative except as noted per HPI    Allergies  Allergen Reactions   Toradol [Ketorolac Tromethamine] Other (See Comments)    Chest tightness, flushed     Past Medical History:  Diagnosis Date   Abnormal uterine bleeding    Anxiety    Arthritis    knees   Chronic pelvic pain in female    Degenerative joint disease    back   Depression    Endometriosis of pelvis    Fibromyalgia    History of ketoacidosis    01-15-2014   PONV (postoperative nausea and vomiting)    Sinus tachycardia    on metoprolol   Type 2 diabetes mellitus (HCC)    Wears contact lenses     Past Surgical History:  Procedure Laterality Date   ABDOMINAL HYSTERECTOMY     APPENDECTOMY     BOWEL RESECTION  04/13/2022   Procedure: SMALL BOWEL RESECTION;  Surgeon: Carver Fila, MD;  Location: [facility];  Service: Gynecology;;   BREAST REDUCTION SURGERY  age 75   CESAREAN SECTION  2002,  2005,  2008   DILATION AND CURETTAGE OF UTERUS N/A 02/13/2014   Procedure: DILATATION AND CURETTAGE;  Surgeon: Adolphus Birchwood, MD;   Location: [facility];  Service: Gynecology;  Laterality: N/A;   EXCISION OF ADNEXAL MASS Right 04/13/2022   Procedure: EXCISION OF ADNEXAL MASS;  Surgeon: Carver Fila, MD;  Location: [facility];  Service: Gynecology;  Laterality: Right;   LAPAROSCOPY N/A 04/13/2022   Procedure: LAPAROSCOPY DIAGNOSTIC;  Surgeon: Carver Fila, MD;  Location: [facility];  Service: Gynecology;  Laterality: N/A;   LEFT OOPHORECTOMY  2019   LYSIS OF ADHESION  04/13/2022   Procedure: LYSIS OF ADHESION;  Surgeon: Carver Fila, MD;  Location: [facility];  Service: Gynecology;;   MANDIBLE SURGERY  age 33   Correct overbite   REDUCTION MAMMAPLASTY      Social History   Socioeconomic History   Marital status: Married    Spouse name: Not on file   Number of children: Not on file   Years of education: Not on file   Highest education level: Bachelor's degree (e.g., BA, AB, BS)  Occupational History   Occupation: works in Designer, jewellery  Tobacco Use   Smoking status: Never   Smokeless tobacco: Never  Vaping Use   Vaping status: Never Used  Substance and Sexual Activity   Alcohol use: No   Drug use: No   Sexual activity:  Yes    Birth control/protection: Surgical  Other Topics Concern   Not on file  Social History Narrative   Not on file   Social Determinants of Health   Financial Resource Strain: Low Risk  (08/11/2022)   Overall Financial Resource Strain (CARDIA)    Difficulty of Paying Living Expenses: Not hard at all  Food Insecurity: No Food Insecurity (08/11/2022)   Hunger Vital Sign    Worried About Running Out of Food in the Last Year: Never true    Ran Out of Food in the Last Year: Never true  Transportation Needs: No Transportation Needs (08/11/2022)   PRAPARE - Administrator, Civil Service (Medical): No    Lack of Transportation (Non-Medical): No  Physical Activity: Sufficiently Active (08/11/2022)   Exercise Vital Sign    Days of Exercise per Week: 4 days     Minutes of Exercise per Session: 40 min  Stress: Stress Concern Present (08/11/2022)   [employer] of Occupational Health - Occupational Stress Questionnaire    Feeling of Stress : Very much  Social Connections: Moderately Isolated (08/11/2022)   Social Connection and Isolation Panel [NHANES]    Frequency of Communication with Friends and Family: Twice a week    Frequency of Social Gatherings with Friends and Family: Once a week    Attends Religious Services: Never    Database administrator or Organizations: No    Attends Engineer, structural: Not on file    Marital Status: Married    Family History  Adopted: Yes  Problem Relation Age of Onset   Hyperlipidemia Mother    Hypertension Mother    Diabetes Mother    Breast cancer Maternal Aunt    Endometriosis Maternal Aunt    Colon cancer Neg Hx    Pancreatic cancer Neg Hx    Ovarian cancer Neg Hx    Prostate cancer Neg Hx     Health Maintenance  Topic Date Due   INFLUENZA VACCINE  08/11/2022   OPHTHALMOLOGY EXAM  11/11/2022 (Originally 08/02/1986)   Hepatitis C Screening  12/15/2022 (Originally 08/02/1994)   HIV Screening  12/15/2022 (Originally 08/02/1991)   COVID-19 Vaccine (3 - 2023-24 season) 03/31/2023 (Originally 09/10/2021)   Diabetic kidney evaluation - Urine ACR  12/15/2022   HEMOGLOBIN A1C  02/11/2023   Diabetic kidney evaluation - eGFR measurement  04/23/2023   FOOT EXAM  05/31/2023   Colonoscopy  10/30/2027   DTaP/Tdap/Td (3 - Td or Tdap) 01/27/2032   HPV VACCINES  Aged Out     ----------------------------------------------------------------------------------------------------------------------------------------------------------------------------------------------------------------- Physical Exam BP 135/87 (BP Location: Right Arm, Patient Position: Sitting, Cuff Size: Large)   Pulse (!) 112   Ht 5\' 6"  (1.676 m)   Wt 257 lb (116.6 kg)   LMP 01/14/2014   SpO2 99%   BMI 41.48 kg/m   Physical  Exam Constitutional:      Appearance: Normal appearance.  Eyes:     General: No scleral icterus. Cardiovascular:     Rate and Rhythm: Normal rate and regular rhythm.  Pulmonary:     Effort: Pulmonary effort is normal.     Breath sounds: Normal breath sounds.  Musculoskeletal:     Cervical back: Neck supple.  Neurological:     Mental Status: She is alert.  Psychiatric:        Mood and Affect: Mood normal.        Behavior: Behavior normal.     ------------------------------------------------------------------------------------------------------------------------------------------------------------------------------------------------------------------- Assessment and Plan  Hypertension associated with diabetes (  HCC) BP remains well controlled continue metoprolol at current strength.    Type 2 diabetes mellitus with obesity (HCC) Diabetes is much better controlled.  Continue titration of Mounjaro to 10mg .  Monitor for hypoglycemia.    GAD (generalized anxiety disorder) Continue fluoxetine with buspar.  Adding vistaril as needed.   F/u in 3-4 months.    Meds ordered this encounter  Medications   tirzepatide (MOUNJARO) 10 MG/0.5ML Pen    Sig: Inject 10 mg into the skin once a week.    Dispense:  6 mL    Refill:  1   hydrOXYzine (VISTARIL) 25 MG capsule    Sig: Take 1-2 capsules (25-50 mg total) by mouth every 8 (eight) hours as needed.    Dispense:  90 capsule    Refill:  1    Return in about 4 months (around 12/11/2022) for F/u T2DM/GAD.    This visit occurred during the SARS-CoV-2 public health emergency.  Safety protocols were in place, including screening questions prior to the visit, additional usage of staff PPE, and extensive cleaning of exam room while observing appropriate contact time as indicated for disinfecting solutions.

## 2022-08-11 NOTE — Assessment & Plan Note (Signed)
Continue fluoxetine with buspar.  Adding vistaril as needed.   F/u in 3-4 months.

## 2022-08-11 NOTE — Assessment & Plan Note (Signed)
Diabetes is much better controlled.  Continue titration of Mounjaro to 10mg .  Monitor for hypoglycemia.

## 2022-08-11 NOTE — Assessment & Plan Note (Signed)
BP remains well controlled continue metoprolol at current strength.

## 2022-08-15 ENCOUNTER — Other Ambulatory Visit: Payer: Self-pay

## 2022-08-15 ENCOUNTER — Encounter: Payer: Self-pay | Admitting: Family Medicine

## 2022-08-15 DIAGNOSIS — E1169 Type 2 diabetes mellitus with other specified complication: Secondary | ICD-10-CM

## 2022-08-15 MED ORDER — DAPAGLIFLOZIN PROPANEDIOL 10 MG PO TABS
10.0000 mg | ORAL_TABLET | Freq: Every day | ORAL | 3 refills | Status: DC
Start: 2022-08-15 — End: 2023-09-19

## 2022-08-18 ENCOUNTER — Telehealth: Payer: Self-pay

## 2022-08-18 ENCOUNTER — Other Ambulatory Visit: Payer: Self-pay | Admitting: Family Medicine

## 2022-08-18 NOTE — Telephone Encounter (Signed)
LVM advising pt that her Freestyle Christine Grant lot has been recalled. It can be replaced by the manufacturer.

## 2022-08-27 ENCOUNTER — Other Ambulatory Visit: Payer: Self-pay | Admitting: Family Medicine

## 2022-08-27 DIAGNOSIS — I152 Hypertension secondary to endocrine disorders: Secondary | ICD-10-CM

## 2022-09-24 ENCOUNTER — Other Ambulatory Visit: Payer: Self-pay | Admitting: Family Medicine

## 2022-10-10 ENCOUNTER — Telehealth: Payer: Self-pay | Admitting: General Practice

## 2022-10-10 NOTE — Transitions of Care (Post Inpatient/ED Visit) (Signed)
10/10/2022  Name: Christine Grant MRN: [id number] DOB: 1975/06/20  Today's TOC FU Call Status: Today's TOC FU Call Status:: Successful TOC FU Call Completed TOC FU Call Complete Date: 10/10/22 Patient's Name and Date of Birth confirmed.  Transition Care Management Follow-up Telephone Call Date of Discharge: 10/07/22 Discharge Facility: Other [facility]) Name of Other (Non[facility]) Discharge Facility: [facility] Type of Discharge: Emergency Department Reason for ED Visit: Other: (diarrhea) How have you been since you were released from the hospital?: Better Any questions or concerns?: No  Items Reviewed: Did you receive and understand the discharge instructions provided?: Yes Medications obtained,verified, and reconciled?: Yes (Medications Reviewed) Any new allergies since your discharge?: No Dietary orders reviewed?: NA Do you have support at home?: Yes  Medications Reviewed Today: Medications Reviewed Today     Reviewed by Modesto Charon, RN (Registered Nurse) on 10/10/22 at 1502  Med List Status: <None>   Medication Order Taking? Sig Documenting Provider Last Dose Status Informant  acetaminophen (TYLENOL) 500 MG tablet [id number] No Take 1,000 mg by mouth every 6 (six) hours as needed for moderate pain. [provider] Taking Active Self, Pharmacy Records  busPIRone (BUSPAR) 10 MG tablet [id number] No Take 1 tablet (10 mg total) by mouth 3 (three) times daily. Replaces alprazolam XR.  Patient taking differently: Take 10 mg by mouth 3 (three) times daily.   Everrett Coombe, DO Taking Active Self, Pharmacy Records  Continuous Blood Gluc Sensor (FREESTYLE LIBRE 3 SENSOR) [place] [id number] No 1 Device by Does not apply route every 14 (fourteen) days. Place 1 sensor on the skin every 14 days. Use to check glucose continuously Everrett Coombe, DO Taking Active Self, Pharmacy Records  cyclobenzaprine (FLEXERIL) 10 MG tablet [id number] No TAKE 1 TABLET BY MOUTH THREE TIMES A DAY AS  NEEDED FOR MUSCLE SPASM  Patient taking differently: Take 10 mg by mouth as needed for muscle spasms.   Everrett Coombe, DO Taking Active Self, Pharmacy Records  dapagliflozin propanediol (FARXIGA) 10 MG TABS tablet [id number]  Take 1 tablet (10 mg total) by mouth daily. Everrett Coombe, DO  Active   FLUoxetine (PROZAC) 40 MG capsule [id number] No Take 1 capsule (40 mg total) by mouth daily. Everrett Coombe, DO Taking Active Self, Pharmacy Records           Med Note (CRUTHIS, Marcy Siren   Tue Apr 19, 2022  7:48 AM) LF 11/23 for 90 DS. Pt is adamant she is still taking this medication daily. Dispense report does not support this claim.   gabapentin (NEURONTIN) 600 MG tablet [id number] No TAKE 2 TABLETS BY MOUTH 3 TIMES A DAY Everrett Coombe, DO Taking Active   hydrOXYzine (VISTARIL) 25 MG capsule [id number]  TAKE 1-2 CAPSULES (25-50 MG TOTAL) BY MOUTH EVERY 8 (EIGHT) HOURS AS NEEDED. Everrett Coombe, DO  Active   insulin glargine (LANTUS SOLOSTAR) 100 UNIT/ML Solostar Pen [id number] No Inject 30 Units into the skin 2 (two) times daily. May titrate to 35 units qam and qpm if needed.  Patient taking differently: Inject 40 Units into the skin 2 (two) times daily.   Everrett Coombe, DO Taking Expired 06/13/22 2359 Self, Pharmacy Records  Insulin Pen Needle (ADVOCATE INSULIN PEN NEEDLES) 33G X 4 MM MISC [id number] No 1 each by Does not apply route daily. Everrett Coombe, DO Taking Active Self, Pharmacy Records  ketoconazole (NIZORAL) 2 % shampoo [id number] No APPLY 1 APPLICATION. TOPICALLY 2 (TWO) TIMES A WEEK.  Patient taking differently: Apply 1 Application topically 2 (two) times a  week.   Everrett Coombe, DO Taking Active Self, Pharmacy Records  metoprolol succinate (TOPROL-XL) 25 MG 24 hr tablet [id number]  TAKE 1 TABLET (25 MG TOTAL) BY MOUTH DAILY. Everrett Coombe, DO  Active   nystatin cream (MYCOSTATIN) [id number]  APPLY TO AFFECTED AREA TWICE A DAY Everrett Coombe, DO  Active   ondansetron (ZOFRAN ODT) 4 MG  disintegrating tablet [id number] No Take 1 tablet (4 mg total) by mouth every 8 (eight) hours as needed for nausea or vomiting.  Patient taking differently: Take 4 mg by mouth 2 (two) times daily as needed for nausea or vomiting.   Milas Hock, MD Taking Active Self, Pharmacy Records  QUEtiapine (SEROQUEL) 50 MG tablet [id number] No Take 1 tablet (50 mg total) by mouth at bedtime.  Patient taking differently: Take 50 mg by mouth at bedtime as needed (sleep).   Everrett Coombe, DO Taking Active Self, Pharmacy Records  tirzepatide [facility]) 10 MG/0.5ML Pen [id number]  Inject 10 mg into the skin once a week. Everrett Coombe, DO  Active             Home Care and Equipment/Supplies: Were Home Health Services Ordered?: NA Any new equipment or medical supplies ordered?: NA  Functional Questionnaire: Do you need assistance with bathing/showering or dressing?: No Do you need assistance with meal preparation?: No Do you need assistance with eating?: No Do you have difficulty maintaining continence: No Do you need assistance with getting out of bed/getting out of a chair/moving?: No Do you have difficulty managing or taking your medications?: No  Follow up appointments reviewed: PCP Follow-up appointment confirmed?: NA Specialist Hospital Follow-up appointment confirmed?: NA Do you need transportation to your follow-up appointment?: No Do you understand care options if your condition(s) worsen?: Yes-patient verbalized understanding  SDOH Interventions Today    Flowsheet Row Most Recent Value  SDOH Interventions   Transportation Interventions Intervention Not Indicated       SIGNATURE Modesto Charon, RN BSN Nurse Health Advisor

## 2022-10-31 ENCOUNTER — Telehealth: Payer: Self-pay | Admitting: General Practice

## 2022-10-31 NOTE — Transitions of Care (Post Inpatient/ED Visit) (Signed)
10/31/2022  Name: Christine Grant MRN: [id number] DOB: [birth year]  Today's TOC FU Call Status: Today's TOC FU Call Status:: Unsuccessful Call (1st Attempt) Unsuccessful Call (1st Attempt) Date: 10/31/22  Attempted to reach the patient regarding the most recent Inpatient/ED visit.  Follow Up Plan: Additional outreach attempts will be made to reach the patient to complete the Transitions of Care (Post Inpatient/ED visit) call.   Signature Modesto Charon, Control and instrumentation engineer

## 2022-11-01 ENCOUNTER — Encounter: Payer: Self-pay | Admitting: Gynecologic Oncology

## 2022-11-01 NOTE — Transitions of Care (Post Inpatient/ED Visit) (Signed)
11/01/2022  Name: Renita Waage MRN: [id number] DOB: 1975-10-07  Today's TOC FU Call Status: Today's TOC FU Call Status:: Unsuccessful Call (2nd Attempt) Unsuccessful Call (1st Attempt) Date: 10/31/22 Unsuccessful Call (2nd Attempt) Date: 11/01/22  Attempted to reach the patient regarding the most recent Inpatient/ED visit.  Follow Up Plan: Additional outreach attempts will be made to reach the patient to complete the Transitions of Care (Post Inpatient/ED visit) call.   Signature Modesto Charon, Control and instrumentation engineer

## 2022-11-02 NOTE — Transitions of Care (Post Inpatient/ED Visit) (Signed)
11/02/2022  Name: Christine Grant MRN: [id number] DOB: 1975/08/19  Today's TOC FU Call Status: Today's TOC FU Call Status:: Unsuccessful Call (3rd Attempt) Unsuccessful Call (1st Attempt) Date: 10/31/22 Unsuccessful Call (2nd Attempt) Date: 11/01/22 Unsuccessful Call (3rd Attempt) Date: 11/02/22  Attempted to reach the patient regarding the most recent Inpatient/ED visit.  Follow Up Plan: No further outreach attempts will be made at this time. We have been unable to contact the patient.  Signature Modesto Charon, Control and instrumentation engineer

## 2022-11-04 ENCOUNTER — Telehealth: Payer: Self-pay | Admitting: *Deleted

## 2022-11-04 ENCOUNTER — Inpatient Hospital Stay: Payer: Managed Care, Other (non HMO) | Admitting: Gynecologic Oncology

## 2022-11-04 DIAGNOSIS — N83299 Other ovarian cyst, unspecified side: Secondary | ICD-10-CM

## 2022-11-04 NOTE — Telephone Encounter (Signed)
Patient called the clinic and left a message stating she wouldn't be able to make her appointment today at 1:30 with Dr. Pricilla Holm due to stomach bug. Pt also states she will call the office back at a later date to reschedule.

## 2022-11-04 NOTE — Progress Notes (Unsigned)
Gynecologic Oncology Return Clinic Visit  11/04/22  Reason for Visit: surveillance  Treatment History: Patient was initially seen in our clinic in 2015-2016 in the setting of chronic pelvic pain, narcotic dependency, and desire for surgical intervention.  She was found to have a thickened endometrial lining and given risk factors for endometrial hyperplasia and malignancy, endometrial sampling was recommended.  She underwent D&C on 02/13/2014 with final pathology revealing benign secretory endometrium.  Her pain was very difficult to manage postoperatively as described in Dr. Oliver Hum note from 02/28/2014.   Her subsequent treatment history is as noted below from most recent 2 oldest.  Shortly after her visit last in our clinic, she underwent robotic hysterectomy. Other surgeries since have included left salpingo-oophorectomy for enlarged ovary and concern for torsion, as well as appendectomy.  At the time of both surgeries, extensive pelvic adhesions were noted in both surgeries required conversion from laparoscopy to open.   2016: Robotic hysterectomy for abnormal uterine bleeding and chronic pelvic pain.  Per patient report, extensive adhesions were noted at that time as well as likely endometriosis.  06/2017: Patient underwent exploratory laparoscopy converted to exploratory laparotomy, lysis of adhesion, cystoscopy, left salpingo-oophorectomy, and sigmoidoscopy in the setting of abdominal pain and concern for ovarian torsion.  Procedure was complicated by dense adhesive disease requiring conversion to open procedure with an estimated blood loss of 300 cc.  At the time of presentation, patient was noted to have an enlarged 6.6 cm left ovary with both imaging and exam findings concerning for torsion.  Findings at the time of surgery included omental and small bowel adhesions to the anterior abdominal wall.  Sigmoid colon was draped over the left ovary.  Right ovary noted to be normal in appearance and  adherent to the sidewall.  Pathology from surgery showed hemorrhagic cyst and intraparenchymal ovarian hemorrhage consistent with ischemic changes.  Benign ovary. 02/04/2018: Pelvic ultrasound shows right ovary measures 7.8 x 6.4 x 5.9 cm with a 6.4 x 5.8 x 4.7 cm cyst containing multiple thin internal septations and membranes with low-level internal echoes.  Cyst thought to most likely represent a hemorrhagic cyst or endometrioma. 06/15/2019: CT of the abdomen and pelvis shows borderline dilation of the tip of the appendix measuring 8 mm with very minimal inflammation.  Ill-defined 1.5 cm hypoattenuating lesion in the posterior segment 4 of the liver.  Chronic scarring along the anterior bladder and in the soft tissues of the low anterior pelvic wall. 06/16/2019: Pelvic ultrasound exam reveals a right ovary measuring 3.1 x 2.3 x 2.6 cm with a small anechoic follicle measuring 1.2 cm.  Previously seen hemorrhagic cyst noted to have involuted. 06/2019: Patient presented with several months of right lower quadrant pain with episodes of nausea.  Given concern for early appendicitis, patient was taken to the operating room for an appendectomy.  This was a laparoscopic procedure converted to open secondary to significant adhesive disease.  Findings at the time of surgery included a enlarged body and tip of the appendix.  Postoperative course was complicated by delayed healing of incision. 03/06/2020: Pelvic ultrasound reveals right ovary measures 3.3 x 3.2 x 2.6 cm with a 1.8 cm dominant follicle. 06/28/2020: Pelvic ultrasound exam at [facility] health shows right ovary measuring 3.9 x 2.7 x 2.8 cm with a 1.4 cm simple appearing cyst. 10/30/2020: Pelvic MRI at [facility] health shows right ovary measuring 3.5 x 1.8 x 2 cm.  Multiple follicles present.  Within the inferior aspect of the ovary, there is an  involuting ovarian cyst with typical peripheral enhancement measuring 2 cm.  Within the superior aspect, there is a T1/T2  hypointense lesion measuring 1.8 x 2 cm, demonstrating diffuse enhancement postcontrast.  Nodule appears to be within the ovary but closely abuts the cecum.  In retrospect review of prior CT scans, this nodule appears to have been present since 2020 and slightly hyperdense as compared to surrounding ovarian tissue. 12/17/2020: CT of the abdomen and pelvis shows enhancing area within the right ovary, possibly due to endometrioma, hemorrhagic cyst, or mass.  This is measured to be at least 2.3 x 1.5 cm. 12/17/2020: Pelvic ultrasound exam at [facility].  Right ovary measures 3.5 x 3.2 x 3.2 cm.  Mildly hypoechoic mass within the right ovary measures 1.8 x 1.6 x 1.7 cm and has associated Doppler flow.  Not significantly changed in size compared to October 2022 MRI. 02/17/2021: Pelvic ultrasound exam at [facility] shows right ovary measuring 3.5 x 2.7 x 3.1 cm.  Small lesion measuring 1.7 x 1.6 x 1.3 cm which does appear to have small amount of internal flow or possible artifact.  Impression is that there is a more solid-appearing lesion on the right ovary compared to 2 cyst like structures on prior examinations.  The cyst could represent hemorrhagic complex cyst or proteinaceous ovarian cyst.  Low degree suspicion for neoplasm considering waxing and waning appearance of the cyst on multiple prior exams.   CEA on 10/28/2020: 3.1   Patient was seen most recently by Dr. Para March on 1/27 in the setting of continued right lower quadrant pain. Most recent pelvic ultrasound exam in our system on 2/25 shows right ovary measures 3.3 x 3.2 x 2.6 cm with a 1.8 cm dominant follicle in the ovary.  No suspicious masses.   See was started on [place] in early 2023.    Pelvic ultrasound in 05/2021 revealed right ovary measures 3.4 x 3.1 x 3 cm.  Previous ovarian cyst no longer seen.  Within the right ovary is a suggestion of a 2.7 cm echogenic solid lesion with vascularity.   CT of the abdomen and pelvis on 06/24/2021 reveals  no acute abnormality seen.     MRI of the pelvis on 06/30/2021 reveals a 2.5 cm solid homogeneously enhancing right ovarian mass.  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   MRI of the pelvis on 12/28/2021 shows a solid right enhancing ovarian lesion within 1 mm of measurements from June 2023 but enlarged since October 2022.  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  No ascites or peritoneal nodularity, no adenopathy.  Postoperative changes along the lower abdominal wall with thinning or absence of the right rectus muscle and laxity of the abdominal wall is similar to prior imaging.   Pelvic ultrasound at [facility] on 03/01/2022 reveals an isoechoic area within the right ovary measuring up to 2.4 cm, thought to perhaps represent a hemorrhagic cyst.   CT of the abdomen and pelvis at [facility] on 03/21/2022 shows 3 cm solid enhancing lesion of the right adnexa, unchanged from prior study.   Pelvic ultrasound on 04/02/2022 at [facility] reveals the right ovary measures 4 x 2.8 x 3.7 cm.  No mass or fluid collection noted.   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  Pelvic washings negative.  Omentum, small bowel resection negative.  Interval History: ***  Past Medical/Surgical History: Past Medical History:  Diagnosis Date   Abnormal uterine bleeding    Anxiety    Arthritis    knees   Chronic pelvic pain in female    Degenerative joint disease    back   Depression    Endometriosis of pelvis    Fibromyalgia    History of ketoacidosis    01-15-2014   PONV  (postoperative nausea and vomiting)    Sinus tachycardia    on metoprolol   Type 2 diabetes mellitus (HCC)    Wears contact lenses     Past Surgical History:  Procedure Laterality Date   ABDOMINAL HYSTERECTOMY     APPENDECTOMY     BOWEL RESECTION  04/13/2022   Procedure: SMALL BOWEL RESECTION;  Surgeon: Carver Fila, MD;  Location: [facility];  Service: Gynecology;;   BREAST REDUCTION SURGERY  age 71   CESAREAN SECTION  2002,  2005,  2008   DILATION AND CURETTAGE OF UTERUS N/A 02/13/2014   Procedure: DILATATION AND CURETTAGE;  Surgeon: Adolphus Birchwood, MD;  Location[facility];  Service: Gynecology;  Laterality: N/A;   EXCISION OF ADNEXAL MASS Right 04/13/2022   Procedure: EXCISION OF ADNEXAL MASS;  Surgeon: Carver Fila, MD;  Location: [facility];  Service: Gynecology;  Laterality: Right;   LAPAROSCOPY N/A 04/13/2022   Procedure: LAPAROSCOPY DIAGNOSTIC;  Surgeon: Carver Fila, MD;  Location: [facility];  Service: Gynecology;  Laterality: N/A;   LEFT OOPHORECTOMY  2019   LYSIS OF ADHESION  04/13/2022   Procedure: LYSIS OF ADHESION;  Surgeon: Carver Fila, MD;  Location: [facility];  Service: Gynecology;;   MANDIBLE SURGERY  age 43   Correct overbite   REDUCTION MAMMAPLASTY      Family History  Adopted: Yes  Problem Relation Age of Onset   Hyperlipidemia Mother    Hypertension Mother    Diabetes Mother    Breast cancer Maternal Aunt    Endometriosis Maternal Aunt    Colon cancer Neg Hx    Pancreatic cancer Neg Hx    Ovarian cancer Neg Hx    Prostate cancer Neg Hx     Social History   Socioeconomic History   Marital status: Married    Spouse name: Not on file   Number of children: Not on file   Years of education: Not on file   Highest education level: Bachelor's degree (e.g., BA, AB, BS)  Occupational History   Occupation: works in Designer, jewellery  Tobacco Use   Smoking status: Never   Smokeless tobacco: Never  Vaping Use   Vaping status: Never  Used  Substance and Sexual Activity   Alcohol use: No   Drug use: No   Sexual activity: Yes    Birth control/protection: Surgical  Other Topics Concern   Not on file  Social History Narrative   Not on file   Social Determinants of Health   Financial Resource Strain: Low Risk  (10/05/2022)   Received from [facility]   Overall Financial Resource Strain (CARDIA)    Difficulty of Paying Living Expenses: Not very hard  Food Insecurity: No Food Insecurity (10/05/2022)   Received from [facility]   Hunger Vital Sign    Worried About Running Out of Food in  the Last Year: Never true    Ran Out of Food in the Last Year: Never true  Transportation Needs: No Transportation Needs (10/10/2022)   PRAPARE - Administrator, Civil Service (Medical): No    Lack of Transportation (Non-Medical): No  Physical Activity: Insufficiently Active (10/05/2022)   Received from [facility]   Exercise Vital Sign    Days of Exercise per Week: 3 days    Minutes of Exercise per Session: 40 min  Stress: Stress Concern Present (10/05/2022)   Received from [facility] of Occupational Health - Occupational Stress Questionnaire    Feeling of Stress : To some extent  Social Connections: Somewhat Isolated (10/05/2022)   Received from [facility]   Social Network    How would you rate your social network (family, work, friends)?: Restricted participation with some degree of social isolation    Current Medications:  Current Outpatient Medications:    acetaminophen (TYLENOL) 500 MG tablet, Take 1,000 mg by mouth every 6 (six) hours as needed for moderate pain., Disp: , Rfl:    Continuous Blood Gluc Sensor (FREESTYLE LIBRE 3 SENSOR) MISC, 1 Device by Does not apply route every 14 (fourteen) days. Place 1 sensor on the skin every 14 days. Use to check glucose continuously, Disp: 2 each, Rfl: 6   cyclobenzaprine (FLEXERIL) 10 MG tablet, TAKE 1 TABLET BY MOUTH THREE TIMES A DAY AS  NEEDED FOR MUSCLE SPASM (Patient taking differently: Take 10 mg by mouth as needed for muscle spasms.), Disp: 270 tablet, Rfl: 1   dapagliflozin propanediol (FARXIGA) 10 MG TABS tablet, Take 1 tablet (10 mg total) by mouth daily., Disp: 90 tablet, Rfl: 3   DULoxetine (CYMBALTA) 30 MG capsule, , Disp: , Rfl:    gabapentin (NEURONTIN) 600 MG tablet, TAKE 2 TABLETS BY MOUTH 3 TIMES A DAY, Disp: 540 tablet, Rfl: 1   Insulin Pen Needle (ADVOCATE INSULIN PEN NEEDLES) 33G X 4 MM MISC, 1 each by Does not apply route daily., Disp: 100 each, Rfl: 12   ketoconazole (NIZORAL) 2 % shampoo, APPLY 1 APPLICATION. TOPICALLY 2 (TWO) TIMES A WEEK. (Patient taking differently: Apply 1 Application topically 2 (two) times a week.), Disp: 120 mL, Rfl: 0   LORazepam (ATIVAN) 0.5 MG tablet, , Disp: , Rfl:    metoprolol succinate (TOPROL-XL) 25 MG 24 hr tablet, TAKE 1 TABLET (25 MG TOTAL) BY MOUTH DAILY., Disp: 90 tablet, Rfl: 1   nystatin cream (MYCOSTATIN), APPLY TO AFFECTED AREA TWICE A DAY, Disp: 30 g, Rfl: 3   ondansetron (ZOFRAN ODT) 4 MG disintegrating tablet, Take 1 tablet (4 mg total) by mouth every 8 (eight) hours as needed for nausea or vomiting. (Patient taking differently: Take 4 mg by mouth 2 (two) times daily as needed for nausea or vomiting.), Disp: 20 tablet, Rfl: 0   QUEtiapine (SEROQUEL) 50 MG tablet, Take 1 tablet (50 mg total) by mouth at bedtime. (Patient taking differently: Take 50 mg by mouth at bedtime as needed (sleep).), Disp: 90 tablet, Rfl: 2   tirzepatide (MOUNJARO) 10 MG/0.5ML Pen, Inject 10 mg into the skin once a week., Disp: 6 mL, Rfl: 1   insulin glargine (LANTUS SOLOSTAR) 100 UNIT/ML Solostar Pen, Inject 30 Units into the skin 2 (two) times daily. May titrate to 35 units qam and qpm if needed. (Patient taking differently: Inject 40 Units into the skin 2 (two) times daily.), Disp: 60 mL, Rfl: 1  Review of Systems: Denies appetite changes, fevers,  chills, fatigue, unexplained weight  changes. Denies hearing loss, neck lumps or masses, mouth sores, ringing in ears or voice changes. Denies cough or wheezing.  Denies shortness of breath. Denies chest pain or palpitations. Denies leg swelling. Denies abdominal distention, pain, blood in stools, constipation, diarrhea, nausea, vomiting, or early satiety. Denies pain with intercourse, dysuria, frequency, hematuria or incontinence. Denies hot flashes, pelvic pain, vaginal bleeding or vaginal discharge.   Denies joint pain, back pain or muscle pain/cramps. Denies itching, rash, or wounds. Denies dizziness, headaches, numbness or seizures. Denies swollen lymph nodes or glands, denies easy bruising or bleeding. Denies anxiety, depression, confusion, or decreased concentration.  Physical Exam: LMP 01/14/2014  General: ***Alert, oriented, no acute distress. HEENT: ***Posterior oropharynx clear, sclera anicteric. Chest: ***Clear to auscultation bilaterally.  ***Port site clean. Cardiovascular: ***Regular rate and rhythm, no murmurs. Abdomen: ***Obese, soft, nontender.  Normoactive bowel sounds.  No masses or hepatosplenomegaly appreciated.  ***Well-healed scar. Extremities: ***Grossly normal range of motion.  Warm, well perfused.  No edema bilaterally. Skin: ***No rashes or lesions noted. Lymphatics: ***No cervical, supraclavicular, or inguinal adenopathy. GU: Normal appearing external genitalia without erythema, excoriation, or lesions.  Speculum exam reveals ***.  Bimanual exam reveals ***.  ***Rectovaginal exam  confirms ___.  Laboratory & Radiologic Studies: ***  Assessment & Plan: Christine Grant is a 47 y.o. woman with a history of Stage IA adult granulosa cell tumor who presents for follow-up. Surgery in 04/2022. Testosterone and free testosterone both elevated prior to surgery, normal subsequently.   ***  Plan to get inhibin B and repeat testosterone.  Given early stage, low risk disease, we will continued with  surveillance visits every 6 months initially.  I reviewed signs and symptoms that would be concerning for recurrence of her granulosa cell tumor and stressed the importance of calling if she develops any of these between visits.  *** minutes of total time was spent for this patient encounter, including preparation, face-to-face counseling with the patient and coordination of care, and documentation of the encounter.  Eugene Garnet, MD  Division of Gynecologic Oncology  Department of Obstetrics and Gynecology  [facility]

## 2022-11-14 ENCOUNTER — Encounter: Payer: Self-pay | Admitting: Family Medicine

## 2022-11-14 NOTE — Telephone Encounter (Signed)
Would advise waiting 1 week.   CM

## 2022-11-16 ENCOUNTER — Encounter: Payer: Self-pay | Admitting: Family Medicine

## 2022-11-16 ENCOUNTER — Ambulatory Visit: Payer: Managed Care, Other (non HMO) | Admitting: Family Medicine

## 2022-11-16 VITALS — BP 128/82 | HR 124 | Ht 66.0 in | Wt 264.0 lb

## 2022-11-16 DIAGNOSIS — G43819 Other migraine, intractable, without status migrainosus: Secondary | ICD-10-CM

## 2022-11-16 DIAGNOSIS — G43909 Migraine, unspecified, not intractable, without status migrainosus: Secondary | ICD-10-CM

## 2022-11-16 DIAGNOSIS — N83299 Other ovarian cyst, unspecified side: Secondary | ICD-10-CM

## 2022-11-16 HISTORY — DX: Migraine, unspecified, not intractable, without status migrainosus: G43.909

## 2022-11-16 MED ORDER — NURTEC 75 MG PO TBDP: ORAL_TABLET | ORAL | Status: DC

## 2022-11-16 MED ORDER — ONDANSETRON 4 MG PO TBDP: 4.0000 mg | ORAL_TABLET | Freq: Three times a day (TID) | ORAL | 0 refills | Status: DC | PRN

## 2022-11-16 MED ORDER — BUTALBITAL-APAP-CAFFEINE 50-325-40 MG PO TABS: 1.0000 | ORAL_TABLET | Freq: Four times a day (QID) | ORAL | 0 refills | Status: DC | PRN

## 2022-11-16 MED ORDER — DEXAMETHASONE SODIUM PHOSPHATE 10 MG/ML IJ SOLN
10.0000 mg | Freq: Once | INTRAMUSCULAR | Status: AC
  Administered 2022-11-16: 10 mg via INTRAMUSCULAR

## 2022-11-16 NOTE — Assessment & Plan Note (Signed)
She may be having some rebound.  Given injection of Decadron today.  Will add Nurtec to see if this is helpful for controlling her symptoms.  Butalbital renewed use if needed for refractory headache.

## 2022-11-16 NOTE — Progress Notes (Signed)
Christine Grant - 47 y.o. female MRN [id number]  Date of birth: Aug 15, 1975  Subjective Chief Complaint  Patient presents with   Migraine    HPI Christine Grant is a 47 y.o. female here today with complaint of increased headaches.  Headaches seem a lot different than her typical tension headaches.  Increasing frequency has occurred over the past couple weeks.  She has been trying Tylenol without much relief.  She has used butalbital in the past which seemed to help.  She denies any neurological changes.  Denies nausea.  Blood pressures remain well-controlled.  Denies any change in medications.  ROS:  A comprehensive ROS was completed and negative except as noted per HPI  Allergies  Allergen Reactions   Toradol [Ketorolac Tromethamine] Other (See Comments)    Chest tightness, flushed     Past Medical History:  Diagnosis Date   Abnormal uterine bleeding    Anxiety    Arthritis    knees   Chronic pelvic pain in female    Degenerative joint disease    back   Depression    Endometriosis of pelvis    Fibromyalgia    History of ketoacidosis    01-15-2014   PONV (postoperative nausea and vomiting)    Sinus tachycardia    on metoprolol   Type 2 diabetes mellitus (HCC)    Wears contact lenses     Past Surgical History:  Procedure Laterality Date   ABDOMINAL HYSTERECTOMY     APPENDECTOMY     BOWEL RESECTION  04/13/2022   Procedure: SMALL BOWEL RESECTION;  Surgeon: Carver Fila, MD;  Location: [facility];  Service: Gynecology;;   BREAST REDUCTION SURGERY  age 77   CESAREAN SECTION  2002,  2005,  2008   DILATION AND CURETTAGE OF UTERUS N/A 02/13/2014   Procedure: DILATATION AND CURETTAGE;  Surgeon: Adolphus Birchwood, MD;  Location: [facility];  Service: Gynecology;  Laterality: N/A;   EXCISION OF ADNEXAL MASS Right 04/13/2022   Procedure: EXCISION OF ADNEXAL MASS;  Surgeon: Carver Fila, MD;  Location: [facility];  Service: Gynecology;  Laterality: Right;   LAPAROSCOPY  N/A 04/13/2022   Procedure: LAPAROSCOPY DIAGNOSTIC;  Surgeon: Carver Fila, MD;  Location: [facility];  Service: Gynecology;  Laterality: N/A;   LEFT OOPHORECTOMY  2019   LYSIS OF ADHESION  04/13/2022   Procedure: LYSIS OF ADHESION;  Surgeon: Carver Fila, MD;  Location: [facility];  Service: Gynecology;;   MANDIBLE SURGERY  age 36   Correct overbite   REDUCTION MAMMAPLASTY      Social History   Socioeconomic History   Marital status: Married    Spouse name: Not on file   Number of children: Not on file   Years of education: Not on file   Highest education level: Some college, no degree  Occupational History   Occupation: works in medical billing  Tobacco Use   Smoking status: Never   Smokeless tobacco: Never  Vaping Use   Vaping status: Never Used  Substance and Sexual Activity   Alcohol use: No   Drug use: No   Sexual activity: Yes    Birth control/protection: Surgical  Other Topics Concern   Not on file  Social History Narrative   Not on file   Social Determinants of Health   Financial Resource Strain: Low Risk  (11/14/2022)   Overall Financial Resource Strain (CARDIA)    Difficulty of Paying Living Expenses: Not hard at all  Food Insecurity: No  Food Insecurity (11/14/2022)   Hunger Vital Sign    Worried About Running Out of Food in the Last Year: Never true    Ran Out of Food in the Last Year: Never true  Transportation Needs: No Transportation Needs (11/14/2022)   PRAPARE - Administrator, Civil Service (Medical): No    Lack of Transportation (Non-Medical): No  Physical Activity: Insufficiently Active (11/14/2022)   Exercise Vital Sign    Days of Exercise per Week: 3 days    Minutes of Exercise per Session: 30 min  Stress: Stress Concern Present (11/14/2022)   [employer] of Occupational Health - Occupational Stress Questionnaire    Feeling of Stress : Very much  Social Connections: Unknown (11/14/2022)   Social Connection and Isolation Panel  [NHANES]    Frequency of Communication with Friends and Family: Once a week    Frequency of Social Gatherings with Friends and Family: Patient declined    Attends Religious Services: Never    Database administrator or Organizations: No    Attends Engineer, structural: Not on file    Marital Status: Married  Recent Concern: Social Connections - Somewhat Isolated (10/05/2022)   Received from [employer]   Social Network    How would you rate your social network (family, work, friends)?: Restricted participation with some degree of social isolation    Family History  Adopted: Yes  Problem Relation Age of Onset   Hyperlipidemia Mother    Hypertension Mother    Diabetes Mother    Breast cancer Maternal Aunt    Endometriosis Maternal Aunt    Colon cancer Neg Hx    Pancreatic cancer Neg Hx    Ovarian cancer Neg Hx    Prostate cancer Neg Hx     Health Maintenance  Topic Date Due   OPHTHALMOLOGY EXAM  Never done   INFLUENZA VACCINE  Never done   COVID-19 Vaccine (3 - 2023-24 season) 09/11/2022   Diabetic kidney evaluation - Urine ACR  12/15/2022   Hepatitis C Screening  12/15/2022 (Originally 08/02/1994)   HIV Screening  12/15/2022 (Originally 08/02/1991)   HEMOGLOBIN A1C  02/11/2023   Diabetic kidney evaluation - eGFR measurement  04/23/2023   FOOT EXAM  05/31/2023   Colonoscopy  10/30/2027   DTaP/Tdap/Td (3 - Td or Tdap) 01/27/2032   HPV VACCINES  Aged Out     ----------------------------------------------------------------------------------------------------------------------------------------------------------------------------------------------------------------- Physical Exam BP 128/82 (BP Location: Left Arm, Patient Position: Sitting, Cuff Size: Large)   Pulse (!) 124   Ht 5\' 6"  (1.676 m)   Wt 264 lb (119.7 kg)   LMP 01/14/2014   SpO2 97%   BMI 42.61 kg/m   Physical Exam Constitutional:      Appearance: Normal appearance.  HENT:     Head: Normocephalic  and atraumatic.  Eyes:     General: No scleral icterus. Cardiovascular:     Rate and Rhythm: Normal rate and regular rhythm.  Pulmonary:     Effort: Pulmonary effort is normal.     Breath sounds: Normal breath sounds.  Musculoskeletal:     Cervical back: Neck supple.  Neurological:     General: No focal deficit present.     Mental Status: She is alert.  Psychiatric:        Mood and Affect: Mood normal.        Behavior: Behavior normal.     ------------------------------------------------------------------------------------------------------------------------------------------------------------------------------------------------------------------- Assessment and Plan  Migraine She may be having some rebound.  Given injection of Decadron  today.  Will add Nurtec to see if this is helpful for controlling her symptoms.  Butalbital renewed use if needed for refractory headache.   Meds ordered this encounter  Medications   dexamethasone (DECADRON) injection 10 mg   butalbital-acetaminophen-caffeine (FIORICET) 50-325-40 MG tablet    Sig: Take 1-2 tablets by mouth every 6 (six) hours as needed.    Dispense:  14 tablet    Refill:  0   Rimegepant Sulfate (NURTEC) 75 MG TBDP    Sig: Take 1 tab po daily as needed for headache.  Lot: [id number] Exp: 05/2024    No follow-ups on file.    This visit occurred during the SARS-CoV-2 public health emergency.  Safety protocols were in place, including screening questions prior to the visit, additional usage of staff PPE, and extensive cleaning of exam room while observing appropriate contact time as indicated for disinfecting solutions.

## 2022-11-16 NOTE — Patient Instructions (Signed)
Try adding magnesium glycinate 250-500mg  daily for headache prevention.

## 2022-12-12 ENCOUNTER — Ambulatory Visit: Payer: Managed Care, Other (non HMO) | Admitting: Family Medicine

## 2022-12-13 ENCOUNTER — Other Ambulatory Visit: Payer: Self-pay | Admitting: Family Medicine

## 2022-12-15 ENCOUNTER — Telehealth: Payer: Self-pay

## 2022-12-15 NOTE — Telephone Encounter (Signed)
Christine Grant called stating she was supposed to be seen 10/25. She was sick and had to cancel. She is calling today to get back in to see Dr.Tucker. First available is 02/02/23 @ 2:30.  Pt agreed to date and time.   I will forward message to Dr.Tucker and Warner Mccreedy NP for advice if pt needs to be seen sooner.

## 2022-12-15 NOTE — Telephone Encounter (Signed)
That sounds good. You can offer her a visit sooner with Melissa if she prefers. Thank you

## 2022-12-16 ENCOUNTER — Ambulatory Visit (INDEPENDENT_AMBULATORY_CARE_PROVIDER_SITE_OTHER): Payer: Managed Care, Other (non HMO)

## 2022-12-16 ENCOUNTER — Encounter: Payer: Self-pay | Admitting: Family Medicine

## 2022-12-16 ENCOUNTER — Ambulatory Visit: Payer: Managed Care, Other (non HMO) | Admitting: Family Medicine

## 2022-12-16 VITALS — BP 138/83 | HR 113 | Ht 66.0 in | Wt 264.0 lb

## 2022-12-16 DIAGNOSIS — R000 Tachycardia, unspecified: Secondary | ICD-10-CM

## 2022-12-16 DIAGNOSIS — R110 Nausea: Secondary | ICD-10-CM

## 2022-12-16 DIAGNOSIS — R1011 Right upper quadrant pain: Secondary | ICD-10-CM

## 2022-12-16 DIAGNOSIS — R42 Dizziness and giddiness: Secondary | ICD-10-CM | POA: Diagnosis not present

## 2022-12-16 HISTORY — DX: Right upper quadrant pain: R10.11

## 2022-12-16 NOTE — Progress Notes (Signed)
Christine Grant - 47 y.o. female MRN [id number]  Date of birth: 02-25-1975  Subjective Chief Complaint  Patient presents with   ruq pain   Tachycardia   Dizziness    HPI Christine Grant is a 47 y.o. female here today for follow up of recent ED visit.  She had chest pressure and tightness with taking a deep breath with radiation across her chest.  She also had some nausea associated with this.  Seen in the ED with no EKG changes and negative cardiac enzymes.  D-dimer negative.  Dx w/ costochondritis.  Since discharge she reports that she still has exertional dyspnea with tachycardia and dizziness but chest discomfort has improved.  She continues to have RUQ abdominal pain with nausea.  This radiates to her back and shoulder blade area.  This does worsen after eating.  LFT's normal at recent ED visit. Denies urinary symptoms.  Bowels are moving normally.   ROS:  A comprehensive ROS was completed and negative except as noted per HPI  Allergies  Allergen Reactions   Toradol [Ketorolac Tromethamine] Other (See Comments)    Chest tightness, flushed     Past Medical History:  Diagnosis Date   Abnormal uterine bleeding    Anxiety    Arthritis    knees   Chronic pelvic pain in female    Degenerative joint disease    back   Depression    Endometriosis of pelvis    Fibromyalgia    History of ketoacidosis    01-15-2014   PONV (postoperative nausea and vomiting)    Sinus tachycardia    on metoprolol   Type 2 diabetes mellitus (HCC)    Wears contact lenses     Past Surgical History:  Procedure Laterality Date   ABDOMINAL HYSTERECTOMY     APPENDECTOMY     BOWEL RESECTION  04/13/2022   Procedure: SMALL BOWEL RESECTION;  Surgeon: Carver Fila, MD;  Location: [facility];  Service: Gynecology;;   BREAST REDUCTION SURGERY  age 17   CESAREAN SECTION  2002,  2005,  2008   DILATION AND CURETTAGE OF UTERUS N/A 02/13/2014   Procedure: DILATATION AND CURETTAGE;  Surgeon: Adolphus Birchwood, MD;   Location: [facility];  Service: Gynecology;  Laterality: N/A;   EXCISION OF ADNEXAL MASS Right 04/13/2022   Procedure: EXCISION OF ADNEXAL MASS;  Surgeon: Carver Fila, MD;  Location: [facility];  Service: Gynecology;  Laterality: Right;   LAPAROSCOPY N/A 04/13/2022   Procedure: LAPAROSCOPY DIAGNOSTIC;  Surgeon: Carver Fila, MD;  Location: [facility];  Service: Gynecology;  Laterality: N/A;   LEFT OOPHORECTOMY  2019   LYSIS OF ADHESION  04/13/2022   Procedure: LYSIS OF ADHESION;  Surgeon: Carver Fila, MD;  Location: [facility];  Service: Gynecology;;   MANDIBLE SURGERY  age 89   Correct overbite   REDUCTION MAMMAPLASTY      Social History   Socioeconomic History   Marital status: Married    Spouse name: Not on file   Number of children: Not on file   Years of education: Not on file   Highest education level: Some college, no degree  Occupational History   Occupation: works in medical billing  Tobacco Use   Smoking status: Never   Smokeless tobacco: Never  Vaping Use   Vaping status: Never Used  Substance and Sexual Activity   Alcohol use: No   Drug use: No   Sexual activity: Yes    Birth control/protection: Surgical  Other Topics Concern   Not on file  Social History Narrative   Not on file   Social Determinants of Health   Financial Resource Strain: Low Risk  (11/14/2022)   Overall Financial Resource Strain (CARDIA)    Difficulty of Paying Living Expenses: Not hard at all  Food Insecurity: No Food Insecurity (11/14/2022)   Hunger Vital Sign    Worried About Running Out of Food in the Last Year: Never true    Ran Out of Food in the Last Year: Never true  Transportation Needs: No Transportation Needs (11/14/2022)   PRAPARE - Administrator, Civil Service (Medical): No    Lack of Transportation (Non-Medical): No  Physical Activity: Insufficiently Active (11/14/2022)   Exercise Vital Sign    Days of Exercise per Week: 3 days    Minutes of  Exercise per Session: 30 min  Stress: Stress Concern Present (11/14/2022)   [employer] of Occupational Health - Occupational Stress Questionnaire    Feeling of Stress : Very much  Social Connections: Unknown (11/14/2022)   Social Connection and Isolation Panel [NHANES]    Frequency of Communication with Friends and Family: Once a week    Frequency of Social Gatherings with Friends and Family: Patient declined    Attends Religious Services: Never    Database administrator or Organizations: No    Attends Engineer, structural: Not on file    Marital Status: Married  Recent Concern: Social Connections - Somewhat Isolated (10/05/2022)   Received from [employer]   Social Network    How would you rate your social network (family, work, friends)?: Restricted participation with some degree of social isolation    Family History  Adopted: Yes  Problem Relation Age of Onset   Hyperlipidemia Mother    Hypertension Mother    Diabetes Mother    Breast cancer Maternal Aunt    Endometriosis Maternal Aunt    Colon cancer Neg Hx    Pancreatic cancer Neg Hx    Ovarian cancer Neg Hx    Prostate cancer Neg Hx     Health Maintenance  Topic Date Due   OPHTHALMOLOGY EXAM  Never done   HIV Screening  Never done   Hepatitis C Screening  Never done   INFLUENZA VACCINE  Never done   COVID-19 Vaccine (3 - 2023-24 season) 09/11/2022   Diabetic kidney evaluation - Urine ACR  12/15/2022   HEMOGLOBIN A1C  02/11/2023   Diabetic kidney evaluation - eGFR measurement  04/23/2023   FOOT EXAM  05/31/2023   Colonoscopy  10/30/2027   DTaP/Tdap/Td (3 - Td or Tdap) 01/27/2032   HPV VACCINES  Aged Out     ----------------------------------------------------------------------------------------------------------------------------------------------------------------------------------------------------------------- Physical Exam BP 138/83 (BP Location: Left Arm, Patient Position: Sitting, Cuff  Size: Normal)   Pulse (!) 113   Ht 5\' 6"  (1.676 m)   Wt 264 lb (119.7 kg)   LMP 01/14/2014   SpO2 98%   BMI 42.61 kg/m   Physical Exam Constitutional:      Appearance: Normal appearance.  Cardiovascular:     Rate and Rhythm: Normal rate and regular rhythm.  Abdominal:     General: Abdomen is flat.     Palpations: Abdomen is soft.     Tenderness: There is abdominal tenderness (RUQ pain).  Neurological:     Mental Status: She is alert.     ------------------------------------------------------------------------------------------------------------------------------------------------------------------------------------------------------------------- Assessment and Plan  Sinus tachycardia She continues to have tachycardia as well as episodes of  dizziness despite metoprolol.  Referral entered to cardiology.   RUQ pain Concern for gallbladder etiology.  RUQ [place] ordered.    No orders of the defined types were placed in this encounter.   No follow-ups on file.    This visit occurred during the SARS-CoV-2 public health emergency.  Safety protocols were in place, including screening questions prior to the visit, additional usage of staff PPE, and extensive cleaning of exam room while observing appropriate contact time as indicated for disinfecting solutions.

## 2022-12-16 NOTE — Assessment & Plan Note (Signed)
She continues to have tachycardia as well as episodes of dizziness despite metoprolol.  Referral entered to cardiology.

## 2022-12-16 NOTE — Assessment & Plan Note (Signed)
Concern for gallbladder etiology.  RUQ [place] ordered.

## 2022-12-16 NOTE — Telephone Encounter (Signed)
Pt declined visit with Warner Mccreedy NP at this time stating she has a lot going on in personal life right now. She is fine with waiting to see Dr.Tucker in January.

## 2022-12-26 ENCOUNTER — Ambulatory Visit: Payer: Managed Care, Other (non HMO) | Admitting: Family Medicine

## 2023-01-05 ENCOUNTER — Other Ambulatory Visit: Payer: Self-pay | Admitting: Family Medicine

## 2023-01-09 ENCOUNTER — Emergency Department (HOSPITAL_BASED_OUTPATIENT_CLINIC_OR_DEPARTMENT_OTHER)
Admission: EM | Admit: 2023-01-09 | Discharge: 2023-01-09 | Disposition: A | Discharge status: Home / Self Care | Payer: Managed Care, Other (non HMO) | Source: Home / Self Care

## 2023-01-10 ENCOUNTER — Ambulatory Visit: Payer: Managed Care, Other (non HMO) | Admitting: Family Medicine

## 2023-01-12 ENCOUNTER — Inpatient Hospital Stay
Admission: RE | Admit: 2023-01-12 | Discharge: 2023-01-12 | Disposition: A | Discharge status: Home / Self Care | Payer: Self-pay | Source: Ambulatory Visit | Attending: Gynecologic Oncology | Admitting: Gynecologic Oncology

## 2023-01-12 ENCOUNTER — Other Ambulatory Visit: Payer: Self-pay

## 2023-01-12 ENCOUNTER — Telehealth: Payer: Self-pay

## 2023-01-12 DIAGNOSIS — R109 Unspecified abdominal pain: Secondary | ICD-10-CM

## 2023-01-12 NOTE — Telephone Encounter (Signed)
 Christine Grant returned call regarding update on abdominal pain  Pt reports on Christmas constant pain started on her right side under her ribs wrapping around to her back. Reports 8/10 on pain scale. Taking Tylenol  only which does not help. She has been nauseated with decrease appetite. BM's have been minimal. Reports no fever/chills, no UTI S&S (pain, pressure, burning or frequency) Pt reports she has been to the ER twice and has had CT scans (12/18 & 12/28). She states she already has a follow up appointment with Dr.Tucker on 1/23 and is wanting to know if she can get something stronger called in to get her to that appointment?   Pt aware Dr.Tucker is in the OR, message will be sent and our office will call her with advice from Dr.Tucker.

## 2023-01-12 NOTE — Telephone Encounter (Signed)
 Office received a voicemail from [place], pt's husband, stating pt was currently in the ER at [facility] for severe right side abdominal pain that is radiating into her back and up to shoulders. He states they were also in the ER a few days ago 12/28. She had A CT scan and the physician recommended they follow up with her PCP or oncologist.   LVM for Ms.Lautner to call the office for an update and so we can access the concern.

## 2023-01-13 ENCOUNTER — Encounter: Payer: Self-pay | Admitting: Family Medicine

## 2023-01-13 NOTE — Telephone Encounter (Signed)
 I reached out to Christine Grant and relayed message from Dr.Tucker. Pt voices an understanding about following up with her PCP. She will keep her appointment on 1/23 with Dr. Pricilla Holm

## 2023-01-17 ENCOUNTER — Telehealth: Payer: Self-pay

## 2023-01-17 ENCOUNTER — Encounter: Payer: Self-pay | Admitting: Family Medicine

## 2023-01-17 NOTE — Telephone Encounter (Signed)
 PA request received from [employer] on [place]. Medication is not currently on her medication list.   LVM for patient to call office

## 2023-01-24 ENCOUNTER — Encounter: Payer: Self-pay | Admitting: Family Medicine

## 2023-01-24 ENCOUNTER — Ambulatory Visit (INDEPENDENT_AMBULATORY_CARE_PROVIDER_SITE_OTHER): Payer: Managed Care, Other (non HMO) | Admitting: Family Medicine

## 2023-01-24 VITALS — BP 129/88 | HR 122 | Ht 66.0 in | Wt 268.2 lb

## 2023-01-24 DIAGNOSIS — N83299 Other ovarian cyst, unspecified side: Secondary | ICD-10-CM

## 2023-01-24 DIAGNOSIS — L0390 Cellulitis, unspecified: Secondary | ICD-10-CM | POA: Insufficient documentation

## 2023-01-24 HISTORY — DX: Cellulitis, unspecified: L03.90

## 2023-01-24 MED ORDER — DOXYCYCLINE HYCLATE 100 MG PO TABS: 100.0000 mg | ORAL_TABLET | Freq: Two times a day (BID) | ORAL | 0 refills | Status: AC

## 2023-01-24 MED ORDER — ONDANSETRON 4 MG PO TBDP: 4.0000 mg | ORAL_TABLET | Freq: Three times a day (TID) | ORAL | 1 refills | Status: DC | PRN

## 2023-01-24 NOTE — Assessment & Plan Note (Signed)
 Treated with course of doxycycline.  She will let me know if not improving.  Red flags reviewed.

## 2023-01-24 NOTE — Progress Notes (Signed)
 Christine Grant - 48 y.o. female MRN [id number]  Date of birth: 17-Jul-1975  Subjective Chief Complaint  Patient presents with   lesion right leg    Lesion right leg below knee- started as what appeared to be a boil , ruptured and now has circular wound x 1 week. Tender to touch.   Tachycardia    Patient pulse at check in was 122- she is scheduled for visit with Cardiology in February to address this.     HPI Christine Grant is a 48 year old female here today with complaint of possible skin infection of her leg.  She noticed a boil just below the right knee about 1 week ago.  Area popped and now she has a tender erythematous area.  She has not had any continued drainage.  She denies fever or chills.  ROS:  A comprehensive ROS was completed and negative except as noted per HPI  Allergies  Allergen Reactions   Toradol  [Ketorolac  Tromethamine ] Other (See Comments)    Chest tightness, flushed     Past Medical History:  Diagnosis Date   Abnormal uterine bleeding    Anxiety    Arthritis    knees   Chronic pelvic pain in female    Degenerative joint disease    back   Depression    Endometriosis of pelvis    Fibromyalgia    History of ketoacidosis    01-15-2014   PONV (postoperative nausea and vomiting)    Sinus tachycardia    on metoprolol    Type 2 diabetes mellitus (HCC)    Wears contact lenses     Past Surgical History:  Procedure Laterality Date   ABDOMINAL HYSTERECTOMY     APPENDECTOMY     BOWEL RESECTION  04/13/2022   Procedure: SMALL BOWEL RESECTION;  Surgeon: Viktoria Comer SAUNDERS, MD;  Location: [facility];  Service: Gynecology;;   BREAST REDUCTION SURGERY  age 38   CESAREAN SECTION  2002,  2005,  2008   DILATION AND CURETTAGE OF UTERUS N/A 02/13/2014   Procedure: DILATATION AND CURETTAGE;  Surgeon: Maurilio Ship, MD;  Location: [facility];  Service: Gynecology;  Laterality: N/A;   EXCISION OF ADNEXAL MASS Right 04/13/2022   Procedure: EXCISION OF ADNEXAL MASS;   Surgeon: Viktoria Comer SAUNDERS, MD;  Location: [facility];  Service: Gynecology;  Laterality: Right;   LAPAROSCOPY N/A 04/13/2022   Procedure: LAPAROSCOPY DIAGNOSTIC;  Surgeon: Viktoria Comer SAUNDERS, MD;  Location: [facility];  Service: Gynecology;  Laterality: N/A;   LEFT OOPHORECTOMY  2019   LYSIS OF ADHESION  04/13/2022   Procedure: LYSIS OF ADHESION;  Surgeon: Viktoria Comer SAUNDERS, MD;  Location: [facility];  Service: Gynecology;;   MANDIBLE SURGERY  age 69   Correct overbite   REDUCTION MAMMAPLASTY      Social History   Socioeconomic History   Marital status: Married    Spouse name: Not on file   Number of children: Not on file   Years of education: Not on file   Highest education level: Some college, no degree  Occupational History   Occupation: works in medical billing  Tobacco Use   Smoking status: Never   Smokeless tobacco: Never  Vaping Use   Vaping status: Never Used  Substance and Sexual Activity   Alcohol use: No   Drug use: No   Sexual activity: Yes    Birth control/protection: Surgical  Other Topics Concern   Not on file  Social History Narrative   Not on file  Social Drivers of Corporate Investment Banker Strain: Low Risk  (01/23/2023)   Overall Financial Resource Strain (CARDIA)    Difficulty of Paying Living Expenses: Not very hard  Food Insecurity: No Food Insecurity (01/23/2023)   Hunger Vital Sign    Worried About Running Out of Food in the Last Year: Never true    Ran Out of Food in the Last Year: Never true  Transportation Needs: No Transportation Needs (01/23/2023)   PRAPARE - Administrator, Civil Service (Medical): No    Lack of Transportation (Non-Medical): No  Physical Activity: Insufficiently Active (01/23/2023)   Exercise Vital Sign    Days of Exercise per Week: 3 days    Minutes of Exercise per Session: 30 min  Stress: Stress Concern Present (01/23/2023)   [employer] of Occupational Health - Occupational Stress Questionnaire    Feeling of  Stress : To some extent  Social Connections: Moderately Isolated (01/23/2023)   Social Connection and Isolation Panel [NHANES]    Frequency of Communication with Friends and Family: Three times a week    Frequency of Social Gatherings with Friends and Family: Once a week    Attends Religious Services: Never    Database Administrator or Organizations: No    Attends Engineer, Structural: Not on file    Marital Status: Married    Family History  Adopted: Yes  Problem Relation Age of Onset   Hyperlipidemia Mother    Hypertension Mother    Diabetes Mother    Breast cancer Maternal Aunt    Endometriosis Maternal Aunt    Colon cancer Neg Hx    Pancreatic cancer Neg Hx    Ovarian cancer Neg Hx    Prostate cancer Neg Hx     Health Maintenance  Topic Date Due   Pneumococcal Vaccine 39-35 Years old (1 of 2 - PCV) Never done   OPHTHALMOLOGY EXAM  Never done   HIV Screening  Never done   Hepatitis C Screening  Never done   INFLUENZA VACCINE  Never done   COVID-19 Vaccine (3 - 2024-25 season) 09/11/2022   Diabetic kidney evaluation - Urine ACR  12/15/2022   HEMOGLOBIN A1C  02/11/2023   Diabetic kidney evaluation - eGFR measurement  04/23/2023   FOOT EXAM  05/31/2023   Colonoscopy  10/30/2027   DTaP/Tdap/Td (3 - Td or Tdap) 01/27/2032   HPV VACCINES  Aged Out     ----------------------------------------------------------------------------------------------------------------------------------------------------------------------------------------------------------------- Physical Exam BP 129/88 (BP Location: Left Arm, Patient Position: Sitting, Cuff Size: Large)   Pulse (!) 122   Ht 5' 6 (1.676 m)   Wt 268 lb 4 oz (121.7 kg)   LMP 01/14/2014   SpO2 100%   BMI 43.30 kg/m   Physical Exam Constitutional:      Appearance: Normal appearance.  Eyes:     General: No scleral icterus. Cardiovascular:     Rate and Rhythm: Normal rate and regular rhythm.  Pulmonary:      Effort: Pulmonary effort is normal.     Breath sounds: Normal breath sounds.  Musculoskeletal:     Cervical back: Neck supple.  Neurological:     General: No focal deficit present.     Mental Status: She is alert.  Psychiatric:        Mood and Affect: Mood normal.        Behavior: Behavior normal.     ------------------------------------------------------------------------------------------------------------------------------------------------------------------------------------------------------------------- Assessment and Plan  Cellulitis Treated with course of doxycycline .  She will  let me know if not improving.  Red flags reviewed.   Meds ordered this encounter  Medications   doxycycline  (VIBRA -TABS) 100 MG tablet    Sig: Take 1 tablet (100 mg total) by mouth 2 (two) times daily for 10 days.    Dispense:  20 tablet    Refill:  0   ondansetron  (ZOFRAN  ODT) 4 MG disintegrating tablet    Sig: Take 1-2 tablets (4-8 mg total) by mouth every 8 (eight) hours as needed for nausea or vomiting.    Dispense:  45 tablet    Refill:  1    No follow-ups on file.    This visit occurred during the SARS-CoV-2 public health emergency.  Safety protocols were in place, including screening questions prior to the visit, additional usage of staff PPE, and extensive cleaning of exam room while observing appropriate contact time as indicated for disinfecting solutions.

## 2023-01-24 NOTE — Telephone Encounter (Signed)
 I spoke to Christine Grant regarding a PA request received through [employer] on [place].  Pt states she is no longer on the medication and PA not needed.

## 2023-01-24 NOTE — Patient Instructions (Signed)
 Start doxycycline twice daily x10 days.  Let me know if worsening.

## 2023-01-25 ENCOUNTER — Other Ambulatory Visit: Payer: Self-pay | Admitting: Family Medicine

## 2023-01-25 DIAGNOSIS — N83299 Other ovarian cyst, unspecified side: Secondary | ICD-10-CM

## 2023-01-25 NOTE — Telephone Encounter (Signed)
 IS this a quantity limit PA situation?

## 2023-01-31 ENCOUNTER — Ambulatory Visit: Payer: Managed Care, Other (non HMO)

## 2023-01-31 ENCOUNTER — Ambulatory Visit: Payer: Managed Care, Other (non HMO) | Admitting: Family Medicine

## 2023-01-31 ENCOUNTER — Encounter: Payer: Self-pay | Admitting: Family Medicine

## 2023-01-31 VITALS — BP 138/77 | HR 103 | Ht 66.0 in | Wt 271.2 lb

## 2023-01-31 DIAGNOSIS — M25562 Pain in left knee: Secondary | ICD-10-CM

## 2023-01-31 DIAGNOSIS — S8992XA Unspecified injury of left lower leg, initial encounter: Secondary | ICD-10-CM

## 2023-01-31 HISTORY — DX: Unspecified injury of left lower leg, initial encounter: S89.92XA

## 2023-01-31 HISTORY — DX: Pain in left knee: M25.562

## 2023-01-31 MED ORDER — CELECOXIB 200 MG PO CAPS: 200.0000 mg | ORAL_CAPSULE | Freq: Two times a day (BID) | ORAL | 0 refills | Status: AC

## 2023-01-31 MED ORDER — MUPIROCIN 2 % EX OINT: TOPICAL_OINTMENT | CUTANEOUS | 0 refills | Status: DC

## 2023-01-31 NOTE — Patient Instructions (Signed)
Rest   Use ice   Use ACE bandage for compression   Keep leg elevated

## 2023-01-31 NOTE — Assessment & Plan Note (Signed)
Presents with significant swelling and tenderness to left knee. Skin breakdown noted. She is already on doxy for skin abscess. Will send in some mupirocin ointment for the area-it does not look infected today - have given celebrex for pain control - will get xrays due to significant pain and swelling - have given ACE bandage  - rtc with pcp if no better

## 2023-01-31 NOTE — Progress Notes (Signed)
Acute Office Visit  Subjective:     Patient ID: Christine Grant, female    DOB: 1975/02/05, 48 y.o.   MRN: [id number]  Chief Complaint  Patient presents with   Knee Injury    Pt states she fell on 01/29/23 and busted her left knee    HPI Patient is in today for right knee pain. She had gotten up in the middle of the night and hit her knee.   Review of Systems  Constitutional:  Negative for chills and fever.  Respiratory:  Negative for cough and shortness of breath.   Cardiovascular:  Negative for chest pain.  Musculoskeletal:        Knee pain  Neurological:  Negative for headaches.        Objective:    BP 138/77 (BP Location: Left Arm, Patient Position: Sitting, Cuff Size: Large)   Pulse (!) 103   Ht 5\' 6"  (1.676 m)   Wt 271 lb 4 oz (123 kg)   LMP 01/14/2014   SpO2 100%   BMI 43.78 kg/m    Physical Exam Vitals and nursing note reviewed.  Constitutional:      General: She is not in acute distress.    Appearance: Normal appearance.  HENT:     Head: Normocephalic and atraumatic.     Right Ear: External ear normal.     Left Ear: External ear normal.     Nose: Nose normal.  Eyes:     Conjunctiva/sclera: Conjunctivae normal.  Cardiovascular:     Rate and Rhythm: Normal rate.  Pulmonary:     Effort: Pulmonary effort is normal.  Musculoskeletal:     Comments: Tenderness to palpation of left knee. Swelling and ecchymosis present on exam. Has difficult with weight bearing  Skin:    Comments: Some skin breakdown present with some redness, no purulent discharge  Neurological:     General: No focal deficit present.     Mental Status: She is alert and oriented to person, place, and time.  Psychiatric:        Mood and Affect: Mood normal.        Behavior: Behavior normal.        Thought Content: Thought content normal.        Judgment: Judgment normal.     No results found for any visits on 01/31/23.      Assessment & Plan:   Problem List Items Addressed  This Visit       Other   Injury of left knee - Primary   Presents with significant swelling and tenderness to left knee. Skin breakdown noted. She is already on doxy for skin abscess. Will send in some mupirocin ointment for the area-it does not look infected today - have given celebrex for pain control - will get xrays due to significant pain and swelling - have given ACE bandage  - rtc with pcp if no better      Relevant Medications   mupirocin ointment (BACTROBAN) 2 %   celecoxib (CELEBREX) 200 MG capsule   Other Relevant Orders   DG Knee 1-2 Views Left   Acute pain of left knee    Meds ordered this encounter  Medications   mupirocin ointment (BACTROBAN) 2 %    Sig: Apply to affected area TID for 7 days.    Dispense:  30 g    Refill:  0   celecoxib (CELEBREX) 200 MG capsule    Sig: Take 1 capsule (200 mg total) by  mouth 2 (two) times daily for 10 days.    Dispense:  20 capsule    Refill:  0    Return if symptoms worsen or fail to improve.  Charlton Amor, DO

## 2023-02-02 ENCOUNTER — Inpatient Hospital Stay: Payer: Managed Care, Other (non HMO) | Admitting: Gynecologic Oncology

## 2023-02-02 ENCOUNTER — Encounter: Payer: Self-pay | Admitting: Gynecologic Oncology

## 2023-02-02 ENCOUNTER — Inpatient Hospital Stay: Payer: Managed Care, Other (non HMO) | Attending: Gynecologic Oncology | Admitting: Gynecologic Oncology

## 2023-02-02 VITALS — BP 142/88 | HR 122 | Temp 98.3°F | Resp 20 | Wt 273.6 lb

## 2023-02-02 DIAGNOSIS — D3910 Neoplasm of uncertain behavior of unspecified ovary: Secondary | ICD-10-CM

## 2023-02-02 DIAGNOSIS — Z90710 Acquired absence of both cervix and uterus: Secondary | ICD-10-CM | POA: Diagnosis not present

## 2023-02-02 DIAGNOSIS — Z8603 Personal history of neoplasm of uncertain behavior: Secondary | ICD-10-CM | POA: Diagnosis not present

## 2023-02-02 DIAGNOSIS — D3911 Neoplasm of uncertain behavior of right ovary: Secondary | ICD-10-CM | POA: Diagnosis not present

## 2023-02-02 DIAGNOSIS — R102 Pelvic and perineal pain: Secondary | ICD-10-CM | POA: Insufficient documentation

## 2023-02-02 DIAGNOSIS — R000 Tachycardia, unspecified: Secondary | ICD-10-CM | POA: Insufficient documentation

## 2023-02-02 DIAGNOSIS — Z90722 Acquired absence of ovaries, bilateral: Secondary | ICD-10-CM | POA: Insufficient documentation

## 2023-02-02 DIAGNOSIS — G8929 Other chronic pain: Secondary | ICD-10-CM | POA: Diagnosis not present

## 2023-02-02 DIAGNOSIS — R109 Unspecified abdominal pain: Secondary | ICD-10-CM

## 2023-02-02 DIAGNOSIS — N838 Other noninflammatory disorders of ovary, fallopian tube and broad ligament: Secondary | ICD-10-CM

## 2023-02-02 NOTE — Progress Notes (Signed)
Gynecologic Oncology Return Clinic Visit  02/02/23  Reason for Visit: surveillance  Treatment History: Patient was initially seen in our clinic in 2015-2016 in the setting of chronic pelvic pain, narcotic dependency, and desire for surgical intervention.  She was found to have a thickened endometrial lining and given risk factors for endometrial hyperplasia and malignancy, endometrial sampling was recommended.  She underwent D&C on 02/13/2014 with final pathology revealing benign secretory endometrium.  Her pain was very difficult to manage postoperatively as described in Dr. Oliver Hum note from 02/28/2014.   Her subsequent treatment history is as noted below from most recent 2 oldest.  Shortly after her visit last in our clinic, she underwent robotic hysterectomy. Other surgeries since have included left salpingo-oophorectomy for enlarged ovary and concern for torsion, as well as appendectomy.  At the time of both surgeries, extensive pelvic adhesions were noted in both surgeries required conversion from laparoscopy to open.   2016: Robotic hysterectomy for abnormal uterine bleeding and chronic pelvic pain.  Per patient report, extensive adhesions were noted at that time as well as likely endometriosis.  06/2017: Patient underwent exploratory laparoscopy converted to exploratory laparotomy, lysis of adhesion, cystoscopy, left salpingo-oophorectomy, and sigmoidoscopy in the setting of abdominal pain and concern for ovarian torsion.  Procedure was complicated by dense adhesive disease requiring conversion to open procedure with an estimated blood loss of 300 cc.  At the time of presentation, patient was noted to have an enlarged 6.6 cm left ovary with both imaging and exam findings concerning for torsion.  Findings at the time of surgery included omental and small bowel adhesions to the anterior abdominal wall.  Sigmoid colon was draped over the left ovary.  Right ovary noted to be normal in appearance and  adherent to the sidewall.  Pathology from surgery showed hemorrhagic cyst and intraparenchymal ovarian hemorrhage consistent with ischemic changes.  Benign ovary. 02/04/2018: Pelvic ultrasound shows right ovary measures 7.8 x 6.4 x 5.9 cm with a 6.4 x 5.8 x 4.7 cm cyst containing multiple thin internal septations and membranes with low-level internal echoes.  Cyst thought to most likely represent a hemorrhagic cyst or endometrioma. 06/15/2019: CT of the abdomen and pelvis shows borderline dilation of the tip of the appendix measuring 8 mm with very minimal inflammation.  Ill-defined 1.5 cm hypoattenuating lesion in the posterior segment 4 of the liver.  Chronic scarring along the anterior bladder and in the soft tissues of the low anterior pelvic wall. 06/16/2019: Pelvic ultrasound exam reveals a right ovary measuring 3.1 x 2.3 x 2.6 cm with a small anechoic follicle measuring 1.2 cm.  Previously seen hemorrhagic cyst noted to have involuted. 06/2019: Patient presented with several months of right lower quadrant pain with episodes of nausea.  Given concern for early appendicitis, patient was taken to the operating room for an appendectomy.  This was a laparoscopic procedure converted to open secondary to significant adhesive disease.  Findings at the time of surgery included a enlarged body and tip of the appendix.  Postoperative course was complicated by delayed healing of incision. 03/06/2020: Pelvic ultrasound reveals right ovary measures 3.3 x 3.2 x 2.6 cm with a 1.8 cm dominant follicle. 06/28/2020: Pelvic ultrasound exam at [facility] shows right ovary measuring 3.9 x 2.7 x 2.8 cm with a 1.4 cm simple appearing cyst. 10/30/2020: Pelvic MRI at [facility] shows right ovary measuring 3.5 x 1.8 x 2 cm.  Multiple follicles present.  Within the inferior aspect of the ovary, there is an  involuting ovarian cyst with typical peripheral enhancement measuring 2 cm.  Within the superior aspect, there is a T1/T2  hypointense lesion measuring 1.8 x 2 cm, demonstrating diffuse enhancement postcontrast.  Nodule appears to be within the ovary but closely abuts the cecum.  In retrospect review of prior CT scans, this nodule appears to have been present since 2020 and slightly hyperdense as compared to surrounding ovarian tissue. 12/17/2020: CT of the abdomen and pelvis shows enhancing area within the right ovary, possibly due to endometrioma, hemorrhagic cyst, or mass.  This is measured to be at least 2.3 x 1.5 cm. 12/17/2020: Pelvic ultrasound exam at [facility].  Right ovary measures 3.5 x 3.2 x 3.2 cm.  Mildly hypoechoic mass within the right ovary measures 1.8 x 1.6 x 1.7 cm and has associated Doppler flow.  Not significantly changed in size compared to October 2022 MRI. 02/17/2021: Pelvic ultrasound exam at [facility] shows right ovary measuring 3.5 x 2.7 x 3.1 cm.  Small lesion measuring 1.7 x 1.6 x 1.3 cm which does appear to have small amount of internal flow or possible artifact.  Impression is that there is a more solid-appearing lesion on the right ovary compared to 2 cyst like structures on prior examinations.  The cyst could represent hemorrhagic complex cyst or proteinaceous ovarian cyst.  Low degree suspicion for neoplasm considering waxing and waning appearance of the cyst on multiple prior exams.   CEA on 10/28/2020: 3.1   Patient was seen most recently by Dr. Para March on 1/27 in the setting of continued right lower quadrant pain. Most recent pelvic ultrasound exam in our system on 2/25 shows right ovary measures 3.3 x 3.2 x 2.6 cm with a 1.8 cm dominant follicle in the ovary.  No suspicious masses.   See was started on [place] in early 2023.    Pelvic ultrasound in 05/2021 revealed right ovary measures 3.4 x 3.1 x 3 cm.  Previous ovarian cyst no longer seen.  Within the right ovary is a suggestion of a 2.7 cm echogenic solid lesion with vascularity.   CT of the abdomen and pelvis on 06/24/2021 reveals  no acute abnormality seen.     MRI of the pelvis on 06/30/2021 reveals a 2.5 cm solid homogeneously enhancing right ovarian mass.  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   MRI of the pelvis on 12/28/2021 shows a solid right enhancing ovarian lesion within 1 mm of measurements from June 2023 but enlarged since October 2022.  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  No ascites or peritoneal nodularity, no adenopathy.  Postoperative changes along the lower abdominal wall with thinning or absence of the right rectus muscle and laxity of the abdominal wall is similar to prior imaging.   Pelvic ultrasound at [facility] on 03/01/2022 reveals an isoechoic area within the right ovary measuring up to 2.4 cm, thought to perhaps represent a hemorrhagic cyst.   CT of the abdomen and pelvis at [facility] on 03/21/2022 shows 3 cm solid enhancing lesion of the right adnexa, unchanged from prior study.   Pelvic ultrasound on 04/02/2022 at [facility] reveals the right ovary measures 4 x 2.8 x 3.7 cm.  No mass or fluid collection noted.   04/13/22: Diagnostic laparoscopy with conversion to exploratory laparotomy, lysis of adhesions and enterolysis for approximately 2.5 hours, right salpingo-oophorectomy, resection of two segments of small bowel with side to side reanastomosis, resection  of suspected small bowel diverticula, omental biopsy   Interval History: Had multiple visits in December and this month for abdominal pain. CT imaging in December negative for acute process, no evidence of recurrent disease.  Overall doing well.  Continues to have intermittent right upper quadrant pain with some radiation down her right flank into her right back.  Is unsure but thinks there may be some association with food.  Has a CT angio coming up and has been referred for endoscopy.  Endorses normal bowel function.  Has had some  intermittent nausea when abdominal pain is more significant.  Denies any vaginal bleeding.  Past Medical/Surgical History: Past Medical History:  Diagnosis Date   Abnormal uterine bleeding    Anxiety    Arthritis    knees   Chronic pelvic pain in female    Degenerative joint disease    back   Depression    Endometriosis of pelvis    Fibromyalgia    History of ketoacidosis    01-15-2014   PONV (postoperative nausea and vomiting)    Sinus tachycardia    on metoprolol   Type 2 diabetes mellitus (HCC)    Wears contact lenses     Past Surgical History:  Procedure Laterality Date   ABDOMINAL HYSTERECTOMY     APPENDECTOMY     BOWEL RESECTION  04/13/2022   Procedure: SMALL BOWEL RESECTION;  Surgeon: Carver Fila, MD;  Location: [facility];  Service: Gynecology;;   BREAST REDUCTION SURGERY  age 75   CESAREAN SECTION  2002,  2005,  2008   DILATION AND CURETTAGE OF UTERUS N/A 02/13/2014   Procedure: DILATATION AND CURETTAGE;  Surgeon: Adolphus Birchwood, MD;  Location: [facility];  Service: Gynecology;  Laterality: N/A;   EXCISION OF ADNEXAL MASS Right 04/13/2022   Procedure: EXCISION OF ADNEXAL MASS;  Surgeon: Carver Fila, MD;  Location: [facility];  Service: Gynecology;  Laterality: Right;   LAPAROSCOPY N/A 04/13/2022   Procedure: LAPAROSCOPY DIAGNOSTIC;  Surgeon: Carver Fila, MD;  Location: [facility];  Service: Gynecology;  Laterality: N/A;   LEFT OOPHORECTOMY  2019   LYSIS OF ADHESION  04/13/2022   Procedure: LYSIS OF ADHESION;  Surgeon: Carver Fila, MD;  Location: [facility];  Service: Gynecology;;   MANDIBLE SURGERY  age 65   Correct overbite   REDUCTION MAMMAPLASTY      Family History  Adopted: Yes  Problem Relation Age of Onset   Hyperlipidemia Mother    Hypertension Mother    Diabetes Mother    Breast cancer Maternal Aunt    Endometriosis Maternal Aunt    Colon cancer Neg Hx    Pancreatic cancer Neg Hx    Ovarian cancer Neg Hx    Prostate cancer  Neg Hx     Social History   Socioeconomic History   Marital status: Married    Spouse name: Not on file   Number of children: Not on file   Years of education: Not on file   Highest education level: Some college, no degree  Occupational History   Occupation: works in Designer, jewellery  Tobacco Use   Smoking status: Never   Smokeless tobacco: Never  Vaping Use   Vaping status: Never Used  Substance and Sexual Activity   Alcohol use: No   Drug use: No   Sexual activity: Yes    Birth control/protection: Surgical  Other Topics Concern   Not on file  Social History Narrative   Not on  file   Social Drivers of Health   Financial Resource Strain: Low Risk  (01/23/2023)   Overall Financial Resource Strain (CARDIA)    Difficulty of Paying Living Expenses: Not very hard  Food Insecurity: No Food Insecurity (01/23/2023)   Hunger Vital Sign    Worried About Running Out of Food in the Last Year: Never true    Ran Out of Food in the Last Year: Never true  Transportation Needs: No Transportation Needs (01/23/2023)   PRAPARE - Administrator, Civil Service (Medical): No    Lack of Transportation (Non-Medical): No  Physical Activity: Insufficiently Active (01/23/2023)   Exercise Vital Sign    Days of Exercise per Week: 3 days    Minutes of Exercise per Session: 30 min  Stress: Stress Concern Present (01/23/2023)   [employer] of Occupational Health - Occupational Stress Questionnaire    Feeling of Stress : To some extent  Social Connections: Moderately Isolated (01/23/2023)   Social Connection and Isolation Panel [NHANES]    Frequency of Communication with Friends and Family: Three times a week    Frequency of Social Gatherings with Friends and Family: Once a week    Attends Religious Services: Never    Database administrator or Organizations: No    Attends Engineer, structural: Not on file    Marital Status: Married    Current Medications:  Current  Outpatient Medications:    acetaminophen (TYLENOL) 500 MG tablet, Take 1,000 mg by mouth every 6 (six) hours as needed for moderate pain., Disp: , Rfl:    ALPRAZolam (XANAX) 0.5 MG tablet, TAKE 1 TABLET BY MOUTH AT BEDTIME AS NEEDED FOR ANXIETY., Disp: 30 tablet, Rfl: 3   butalbital-acetaminophen-caffeine (FIORICET) 50-325-40 MG tablet, TAKE 1-2 TABLETS BY MOUTH EVERY 6 HOURS AS NEEDED., Disp: 14 tablet, Rfl: 2   celecoxib (CELEBREX) 200 MG capsule, Take 1 capsule (200 mg total) by mouth 2 (two) times daily for 10 days., Disp: 20 capsule, Rfl: 0   Continuous Blood Gluc Sensor (FREESTYLE LIBRE 3 SENSOR) MISC, 1 Device by Does not apply route every 14 (fourteen) days. Place 1 sensor on the skin every 14 days. Use to check glucose continuously, Disp: 2 each, Rfl: 6   dapagliflozin propanediol (FARXIGA) 10 MG TABS tablet, Take 1 tablet (10 mg total) by mouth daily., Disp: 90 tablet, Rfl: 3   doxycycline (VIBRA-TABS) 100 MG tablet, Take 1 tablet (100 mg total) by mouth 2 (two) times daily for 10 days., Disp: 20 tablet, Rfl: 0   DULoxetine (CYMBALTA) 30 MG capsule, , Disp: , Rfl:    gabapentin (NEURONTIN) 600 MG tablet, TAKE 2 TABLETS BY MOUTH 3 TIMES A DAY, Disp: 540 tablet, Rfl: 1   hydrOXYzine (VISTARIL) 25 MG capsule, Take 25-50 mg by mouth every 8 (eight) hours as needed., Disp: , Rfl:    insulin glargine-yfgn (SEMGLEE) 100 UNIT/ML Pen, Inject 40 Units into the skin 2 (two) times daily., Disp: 15 mL, Rfl: 2   Insulin Pen Needle (ADVOCATE INSULIN PEN NEEDLES) 33G X 4 MM MISC, 1 each by Does not apply route daily., Disp: 100 each, Rfl: 12   ketoconazole (NIZORAL) 2 % shampoo, APPLY 1 APPLICATION. TOPICALLY 2 (TWO) TIMES A WEEK. (Patient taking differently: Apply 1 Application topically 2 (two) times a week.), Disp: 120 mL, Rfl: 0   LORazepam (ATIVAN) 0.5 MG tablet, , Disp: , Rfl:    metoprolol succinate (TOPROL-XL) 25 MG 24 hr tablet, TAKE 1 TABLET (25 MG  TOTAL) BY MOUTH DAILY., Disp: 90 tablet, Rfl: 1    mupirocin ointment (BACTROBAN) 2 %, Apply to affected area TID for 7 days., Disp: 30 g, Rfl: 0   nystatin cream (MYCOSTATIN), APPLY TO AFFECTED AREA TWICE A DAY, Disp: 30 g, Rfl: 3   ondansetron (ZOFRAN ODT) 4 MG disintegrating tablet, Take 1-2 tablets (4-8 mg total) by mouth every 8 (eight) hours as needed for nausea or vomiting., Disp: 45 tablet, Rfl: 1   QUEtiapine (SEROQUEL) 50 MG tablet, Take 1 tablet (50 mg total) by mouth at bedtime. (Patient taking differently: Take 50 mg by mouth at bedtime as needed (sleep).), Disp: 90 tablet, Rfl: 2   Rimegepant Sulfate (NURTEC) 75 MG TBDP, Take 1 tab po daily as needed for headache.  Lot: [id number] Exp: 05/2024, Disp: , Rfl:    tirzepatide (MOUNJARO) 10 MG/0.5ML Pen, Inject 10 mg into the skin once a week., Disp: 6 mL, Rfl: 1  Review of Systems: + Abdominal pain, pelvic pain, back pain Denies appetite changes, fevers, chills, fatigue, unexplained weight changes. Denies hearing loss, neck lumps or masses, mouth sores, ringing in ears or voice changes. Denies cough or wheezing.  Denies shortness of breath. Denies chest pain or palpitations. Denies leg swelling. Denies abdominal distention, blood in stools, constipation, diarrhea, nausea, vomiting, or early satiety. Denies pain with intercourse, dysuria, frequency, hematuria or incontinence. Denies hot flashes, vaginal bleeding or vaginal discharge.   Denies joint pain or muscle pain/cramps. Denies itching, rash, or wounds. Denies dizziness, headaches, numbness or seizures. Denies swollen lymph nodes or glands, denies easy bruising or bleeding. Denies anxiety, depression, confusion, or decreased concentration.  Physical Exam: BP (!) 144/95 (BP Location: Left Arm, Patient Position: Sitting) Comment: Notified RN  Pulse (!) 122 Comment: Patient states thats normal for her  Temp 98.3 F (36.8 C) (Oral)   Resp 20   Wt 273 lb 9.6 oz (124.1 kg)   LMP 01/14/2014   SpO2 98%   BMI 44.16 kg/m  General:  Alert, oriented, no acute distress. HEENT: Normocephalic, atraumatic, sclera anicteric. Chest: Unlabored breathing on room air. Abdomen: Obese, soft, nontender.  Normoactive bowel sounds.  No masses or hepatosplenomegaly appreciated.  Well-healed incision.   Extremities: Grossly normal range of motion.  Warm, well perfused.  No edema bilaterally. Genitourinary: Deferred.  Laboratory & Radiologic Studies: None new  Assessment & Plan: Christine Grant is a 48 y.o. woman with Stage IA adult granulosa cell tumor who presents for follow-up.   Patient is doing well, NED on exam today.  Preference was to skip pelvic exam.  Plan for testosterone and inhibin B today.  The patient was tachycardic today, about at her baseline.  Denies any associated symptoms.   Given early stage, low risk disease, we will continue with surveillance visits every 6 months initially.  I reviewed signs and symptoms that would be concerning for recurrence of her granulosa cell tumor and stressed the importance of calling if she develops any of these between visits.  22 minutes of total time was spent for this patient encounter, including preparation, face-to-face counseling with the patient and coordination of care, and documentation of the encounter.  Eugene Garnet, MD  Division of Gynecologic Oncology  Department of Obstetrics and Gynecology  [facility]

## 2023-02-02 NOTE — Patient Instructions (Signed)
It was good to see you today.  I do not see or feel any evidence of cancer recurrence on your exam.  I will see you for follow-up in 6 months.  As always, if you develop any new and concerning symptoms before your next visit, please call to see me sooner.   

## 2023-02-03 ENCOUNTER — Other Ambulatory Visit: Payer: Self-pay | Admitting: Family Medicine

## 2023-02-03 ENCOUNTER — Encounter: Payer: Self-pay | Admitting: Gynecologic Oncology

## 2023-02-03 LAB — INHIBIN B: Inhibin B: <7 pg/mL

## 2023-02-04 LAB — TESTOSTERONE, FREE, TOTAL, SHBG
Sex Hormone Binding: 30.3 nmol/L (ref 24.6–122.0)
Testosterone, Free: 1 pg/mL (ref 0.0–4.2)
Testosterone: <3 ng/dL — ABNORMAL LOW (ref 4–50)

## 2023-02-12 ENCOUNTER — Other Ambulatory Visit: Payer: Self-pay | Admitting: Family Medicine

## 2023-02-13 ENCOUNTER — Other Ambulatory Visit: Payer: Self-pay | Admitting: Family Medicine

## 2023-02-14 ENCOUNTER — Encounter: Payer: Self-pay | Admitting: Gynecologic Oncology

## 2023-02-15 ENCOUNTER — Other Ambulatory Visit: Payer: Self-pay | Admitting: Family Medicine

## 2023-02-15 DIAGNOSIS — E1159 Type 2 diabetes mellitus with other circulatory complications: Secondary | ICD-10-CM

## 2023-02-16 ENCOUNTER — Other Ambulatory Visit: Payer: Self-pay | Admitting: Family Medicine

## 2023-02-23 ENCOUNTER — Other Ambulatory Visit: Payer: Self-pay

## 2023-02-23 DIAGNOSIS — M1990 Unspecified osteoarthritis, unspecified site: Secondary | ICD-10-CM | POA: Insufficient documentation

## 2023-02-23 DIAGNOSIS — Z973 Presence of spectacles and contact lenses: Secondary | ICD-10-CM | POA: Insufficient documentation

## 2023-02-23 DIAGNOSIS — F32A Depression, unspecified: Secondary | ICD-10-CM | POA: Insufficient documentation

## 2023-02-23 DIAGNOSIS — E119 Type 2 diabetes mellitus without complications: Secondary | ICD-10-CM | POA: Insufficient documentation

## 2023-02-23 DIAGNOSIS — Z8639 Personal history of other endocrine, nutritional and metabolic disease: Secondary | ICD-10-CM | POA: Insufficient documentation

## 2023-02-23 DIAGNOSIS — F419 Anxiety disorder, unspecified: Secondary | ICD-10-CM | POA: Insufficient documentation

## 2023-02-24 ENCOUNTER — Ambulatory Visit: Payer: Managed Care, Other (non HMO) | Attending: Cardiology | Admitting: Cardiology

## 2023-03-07 NOTE — Telephone Encounter (Signed)
 Drug is covered by current benefit plan. No further PA activity needed

## 2023-03-08 ENCOUNTER — Other Ambulatory Visit: Payer: Self-pay | Admitting: Family Medicine

## 2023-03-10 ENCOUNTER — Telehealth: Payer: Self-pay

## 2023-03-10 NOTE — Telephone Encounter (Signed)
 Medication sent in yesterday afternoon.

## 2023-03-10 NOTE — Telephone Encounter (Signed)
 Copied from CRM [id number]. Topic: Clinical - Prescription Issue >> Mar 09, 2023  2:45 PM Christine Grant wrote: Reason for CRM: butalbital-acetaminophen-caffeine (FIORICET) 50-325-40 MG tablet- this was requested but it not filled yet, may need prior authorization- [phone]

## 2023-03-16 ENCOUNTER — Other Ambulatory Visit: Payer: Self-pay | Admitting: Family Medicine

## 2023-04-03 ENCOUNTER — Encounter: Payer: Self-pay | Admitting: Family Medicine

## 2023-04-22 ENCOUNTER — Other Ambulatory Visit: Payer: Self-pay | Admitting: Family Medicine

## 2023-04-25 ENCOUNTER — Other Ambulatory Visit: Payer: Self-pay | Admitting: Family Medicine

## 2023-04-25 DIAGNOSIS — G8929 Other chronic pain: Secondary | ICD-10-CM

## 2023-04-26 NOTE — Telephone Encounter (Signed)
 Last OV: 01/31/23 (acute) Next OV: no appt scheduled Last RF: 03/09/23

## 2023-04-27 ENCOUNTER — Encounter: Payer: Self-pay | Admitting: Family Medicine

## 2023-05-03 ENCOUNTER — Other Ambulatory Visit: Payer: Self-pay | Admitting: Family Medicine

## 2023-05-05 ENCOUNTER — Other Ambulatory Visit (HOSPITAL_BASED_OUTPATIENT_CLINIC_OR_DEPARTMENT_OTHER): Payer: Self-pay | Admitting: Family Medicine

## 2023-05-05 DIAGNOSIS — Z1231 Encounter for screening mammogram for malignant neoplasm of breast: Secondary | ICD-10-CM

## 2023-05-11 ENCOUNTER — Ambulatory Visit

## 2023-05-11 DIAGNOSIS — Z1231 Encounter for screening mammogram for malignant neoplasm of breast: Secondary | ICD-10-CM

## 2023-05-15 ENCOUNTER — Encounter: Payer: Self-pay | Admitting: Family Medicine

## 2023-05-15 ENCOUNTER — Other Ambulatory Visit: Payer: Self-pay | Admitting: Family Medicine

## 2023-05-15 ENCOUNTER — Ambulatory Visit: Admitting: Family Medicine

## 2023-05-15 VITALS — BP 136/82 | HR 118 | Ht 66.0 in | Wt 269.0 lb

## 2023-05-15 DIAGNOSIS — Z7984 Long term (current) use of oral hypoglycemic drugs: Secondary | ICD-10-CM

## 2023-05-15 DIAGNOSIS — R109 Unspecified abdominal pain: Secondary | ICD-10-CM | POA: Insufficient documentation

## 2023-05-15 DIAGNOSIS — Z7985 Long-term (current) use of injectable non-insulin antidiabetic drugs: Secondary | ICD-10-CM

## 2023-05-15 DIAGNOSIS — I152 Hypertension secondary to endocrine disorders: Secondary | ICD-10-CM

## 2023-05-15 DIAGNOSIS — E1169 Type 2 diabetes mellitus with other specified complication: Secondary | ICD-10-CM

## 2023-05-15 DIAGNOSIS — F411 Generalized anxiety disorder: Secondary | ICD-10-CM | POA: Diagnosis not present

## 2023-05-15 DIAGNOSIS — E669 Obesity, unspecified: Secondary | ICD-10-CM | POA: Diagnosis not present

## 2023-05-15 NOTE — Assessment & Plan Note (Signed)
 Improved since ED visit but hasn't fully resolved. She will follow up with GI.  Update labs today.

## 2023-05-15 NOTE — Progress Notes (Signed)
 Christine Grant - 48 y.o. female MRN [id number]  Date of birth: [birth year]  Subjective Chief Complaint  Patient presents with   Hospitalization Follow-up    HPI Christine Grant is a 48 y.o. female here today for follow up of recent ED visit.   She was seen in the ED for abdominal pain.  Felt to possibly be due to gastroenteritis.  Her evlauation in ED was reassuring.  She was referred to GI for further evaluation as well, she is planning on calling today to schedule this.  Overall improved, but still having some abdominal discomfort.  She is concerned about labs including electrolyte abnormalities.   ROS:  A comprehensive ROS was completed and negative except as noted per HPI   Allergies  Allergen Reactions   Toradol  [Ketorolac  Tromethamine ] Other (See Comments)    Chest tightness, flushed     Past Medical History:  Diagnosis Date   Acute pain of left knee 01/31/2023   Anemia 07/01/2015   Anxiety    Arthritis    knees   Cellulitis 01/24/2023   Complex ovarian cyst 03/03/2020   Degenerative disc disease, cervical 01/22/2015   Degenerative joint disease    back   Depression    Diabetes (HCC) 09/03/2014   Endometriosis of pelvis    Fibromyalgia muscle pain 10/07/2013   GAD (generalized anxiety disorder) 10/07/2013   History of ketoacidosis    01-15-2014   History of migraine headaches 05/27/2015   Hypertension associated with diabetes (HCC) 02/02/2015   Hypertriglyceridemia 12/24/2014   Influenza-like illness 02/14/2020   Injury of left knee 01/31/2023   Insomnia 12/14/2021   Intra-abdominal adhesions 08/23/2019   Lipoma 05/31/2022   Migraine 11/16/2022   Morbid obesity (HCC) 07/09/2014   Ovarian mass 04/13/2022   Pelvic adhesive disease 11/05/2020   Postoperative nausea 04/19/2022   Postoperative pain 04/19/2022   Primary osteoarthritis of both hips 01/22/2015   RUQ pain 12/16/2022   S/P appendectomy 06/28/2019   S/P unilateral salpingo-oophorectomy 07/12/2017    Formatting of this note might be different from the original.  Left     Sinus tachycardia    on metoprolol    Type 2 diabetes mellitus (HCC)    Type 2 diabetes mellitus with obesity (HCC) 10/07/2013   Wears contact lenses     Past Surgical History:  Procedure Laterality Date   ABDOMINAL HYSTERECTOMY     APPENDECTOMY     BOWEL RESECTION  04/13/2022   Procedure: SMALL BOWEL RESECTION;  Surgeon: Suzi Essex, MD;  Location: [facility];  Service: Gynecology;;   BREAST REDUCTION SURGERY  age 75   CESAREAN SECTION  2002,  2005,  2008   DILATION AND CURETTAGE OF UTERUS N/A 02/13/2014   Procedure: DILATATION AND CURETTAGE;  Surgeon: Alphonso Aschoff, MD;  Location: [facility] [place];  Service: Gynecology;  Laterality: N/A;   EXCISION OF ADNEXAL MASS Right 04/13/2022   Procedure: EXCISION OF ADNEXAL MASS;  Surgeon: Suzi Essex, MD;  Location: [facility];  Service: Gynecology;  Laterality: Right;   LAPAROSCOPY N/A 04/13/2022   Procedure: LAPAROSCOPY DIAGNOSTIC;  Surgeon: Suzi Essex, MD;  Location: [facility];  Service: Gynecology;  Laterality: N/A;   LEFT OOPHORECTOMY  2019   LYSIS OF ADHESION  04/13/2022   Procedure: LYSIS OF ADHESION;  Surgeon: Suzi Essex, MD;  Location: [facility];  Service: Gynecology;;   MANDIBLE SURGERY  age 21   Correct overbite   REDUCTION MAMMAPLASTY      Social History   Socioeconomic  History   Marital status: Married    Spouse name: Not on file   Number of children: Not on file   Years of education: Not on file   Highest education level: Some college, no degree  Occupational History   Occupation: works in Designer, jewellery  Tobacco Use   Smoking status: Never   Smokeless tobacco: Never  Vaping Use   Vaping status: Never Used  Substance and Sexual Activity   Alcohol use: No   Drug use: No   Sexual activity: Yes    Birth control/protection: Surgical  Other Topics Concern   Not on file  Social History Narrative   Not on file   Social  Drivers of Health   Financial Resource Strain: Low Risk  (01/23/2023)   Overall Financial Resource Strain (CARDIA)    Difficulty of Paying Living Expenses: Not very hard  Food Insecurity: No Food Insecurity (01/23/2023)   Hunger Vital Sign    Worried About Running Out of Food in the Last Year: Never true    Ran Out of Food in the Last Year: Never true  Transportation Needs: No Transportation Needs (01/23/2023)   PRAPARE - Administrator, Civil Service (Medical): No    Lack of Transportation (Non-Medical): No  Physical Activity: Insufficiently Active (01/23/2023)   Exercise Vital Sign    Days of Exercise per Week: 3 days    Minutes of Exercise per Session: 30 min  Stress: Stress Concern Present (01/23/2023)   [employer] of Occupational Health - Occupational Stress Questionnaire    Feeling of Stress : To some extent  Social Connections: Moderately Isolated (01/23/2023)   Social Connection and Isolation Panel [NHANES]    Frequency of Communication with Friends and Family: Three times a week    Frequency of Social Gatherings with Friends and Family: Once a week    Attends Religious Services: Never    Database administrator or Organizations: No    Attends Engineer, structural: Not on file    Marital Status: Married    Family History  Adopted: Yes  Problem Relation Age of Onset   Hyperlipidemia Mother    Hypertension Mother    Diabetes Mother    Breast cancer Maternal Aunt    Endometriosis Maternal Aunt    Colon cancer Neg Hx    Pancreatic cancer Neg Hx    Ovarian cancer Neg Hx    Prostate cancer Neg Hx     Health Maintenance  Topic Date Due   OPHTHALMOLOGY EXAM  Never done   HIV Screening  Never done   Hepatitis C Screening  Never done   Pneumococcal Vaccine 7-2 Years old (1 of 2 - PCV) Never done   COVID-19 Vaccine (3 - 2024-25 season) 09/11/2022   Diabetic kidney evaluation - Urine ACR  12/15/2022   HEMOGLOBIN A1C  02/11/2023   Diabetic kidney  evaluation - eGFR measurement  04/23/2023   FOOT EXAM  05/31/2023   INFLUENZA VACCINE  08/11/2023   Colonoscopy  10/30/2027   DTaP/Tdap/Td (3 - Td or Tdap) 01/27/2032   HPV VACCINES  Aged Out   Meningococcal B Vaccine  Aged Out     ----------------------------------------------------------------------------------------------------------------------------------------------------------------------------------------------------------------- Physical Exam BP 136/82 (BP Location: Right Arm, Patient Position: Sitting, Cuff Size: Large)   Pulse (!) 118   Ht 5\' 6"  (1.676 m)   Wt 269 lb (122 kg)   LMP 01/14/2014   SpO2 100%   BMI 43.42 kg/m   Physical Exam Constitutional:  Appearance: Normal appearance.  Neurological:     General: No focal deficit present.     Mental Status: She is alert and oriented to person, place, and time.  Psychiatric:        Mood and Affect: Mood normal.        Behavior: Behavior normal.     ------------------------------------------------------------------------------------------------------------------------------------------------------------------------------------------------------------------- Assessment and Plan  Abdominal pain Improved since ED visit but hasn't fully resolved. She will follow up with GI.  Update labs today.   Type 2 diabetes mellitus with obesity (HCC) Blood sugars elevated recently.  Encouraged dietary changes to help with this.    GAD (generalized anxiety disorder) Continue fluoxetine  with buspar . She added clonazepam  back on.  She has discontinued opioid pain medication.    No orders of the defined types were placed in this encounter.   No follow-ups on file.

## 2023-05-15 NOTE — Assessment & Plan Note (Signed)
 Blood sugars elevated recently.  Encouraged dietary changes to help with this.

## 2023-05-15 NOTE — Assessment & Plan Note (Signed)
 Continue fluoxetine  with buspar . She added clonazepam  back on.  She has discontinued opioid pain medication.

## 2023-05-16 LAB — CMP14+EGFR
ALT: 37 IU/L — ABNORMAL HIGH (ref 0–32)
AST: 23 IU/L (ref 0–40)
Albumin: 4.3 g/dL (ref 3.9–4.9)
Alkaline Phosphatase: 156 IU/L — ABNORMAL HIGH (ref 44–121)
BUN/Creatinine Ratio: 23 (ref 9–23)
BUN: 14 mg/dL (ref 6–24)
Bilirubin Total: 0.3 mg/dL (ref 0.0–1.2)
CO2: 18 mmol/L — ABNORMAL LOW (ref 20–29)
Calcium: 9.5 mg/dL (ref 8.7–10.2)
Chloride: 96 mmol/L (ref 96–106)
Creatinine, Ser: 0.62 mg/dL (ref 0.57–1.00)
Globulin, Total: 2.3 g/dL (ref 1.5–4.5)
Glucose: 331 mg/dL — ABNORMAL HIGH (ref 70–99)
Potassium: 5.1 mmol/L (ref 3.5–5.2)
Sodium: 134 mmol/L (ref 134–144)
Total Protein: 6.6 g/dL (ref 6.0–8.5)
eGFR: 111 mL/min/{1.73_m2} (ref >59)

## 2023-05-16 LAB — CBC WITH DIFFERENTIAL/PLATELET
Basophils Absolute: 0.1 10*3/uL (ref 0.0–0.2)
Basos: 1 %
EOS (ABSOLUTE): 0.1 10*3/uL (ref 0.0–0.4)
Eos: 2 %
Hematocrit: 49.4 % — ABNORMAL HIGH (ref 34.0–46.6)
Hemoglobin: 15.9 g/dL (ref 11.1–15.9)
Immature Grans (Abs): 0 10*3/uL (ref 0.0–0.1)
Immature Granulocytes: 0 %
Lymphocytes Absolute: 1.7 10*3/uL (ref 0.7–3.1)
Lymphs: 23 %
MCH: 26.4 pg — ABNORMAL LOW (ref 26.6–33.0)
MCHC: 32.2 g/dL (ref 31.5–35.7)
MCV: 82 fL (ref 79–97)
Monocytes Absolute: 0.4 10*3/uL (ref 0.1–0.9)
Monocytes: 5 %
Neutrophils Absolute: 5.2 10*3/uL (ref 1.4–7.0)
Neutrophils: 69 %
Platelets: 264 10*3/uL (ref 150–450)
RBC: 6.02 x10E6/uL — ABNORMAL HIGH (ref 3.77–5.28)
RDW: 13.1 % (ref 11.7–15.4)
WBC: 7.6 10*3/uL (ref 3.4–10.8)

## 2023-05-16 LAB — MICROALBUMIN / CREATININE URINE RATIO
Creatinine, Urine: 16.7 mg/dL
Microalb/Creat Ratio: <18 mg/g{creat} (ref 0–29)
Microalbumin, Urine: <3 ug/mL

## 2023-05-22 ENCOUNTER — Other Ambulatory Visit: Payer: Self-pay | Admitting: Family Medicine

## 2023-05-26 ENCOUNTER — Ambulatory Visit: Payer: Self-pay | Admitting: Family Medicine

## 2023-06-29 ENCOUNTER — Other Ambulatory Visit: Payer: Self-pay | Admitting: Family Medicine

## 2023-07-03 ENCOUNTER — Other Ambulatory Visit: Payer: Self-pay | Admitting: Family Medicine

## 2023-07-05 ENCOUNTER — Encounter: Payer: Self-pay | Admitting: Family Medicine

## 2023-07-26 ENCOUNTER — Encounter: Payer: Self-pay | Admitting: Family Medicine

## 2023-07-28 ENCOUNTER — Encounter: Payer: Self-pay | Admitting: Family Medicine

## 2023-08-03 ENCOUNTER — Other Ambulatory Visit: Payer: Self-pay | Admitting: Gynecologic Oncology

## 2023-08-03 DIAGNOSIS — D3910 Neoplasm of uncertain behavior of unspecified ovary: Secondary | ICD-10-CM

## 2023-08-04 ENCOUNTER — Inpatient Hospital Stay: Payer: Managed Care, Other (non HMO)

## 2023-08-04 ENCOUNTER — Inpatient Hospital Stay: Payer: Managed Care, Other (non HMO) | Attending: Gynecologic Oncology | Admitting: Gynecologic Oncology

## 2023-08-04 ENCOUNTER — Encounter: Payer: Self-pay | Admitting: Gynecologic Oncology

## 2023-08-04 VITALS — BP 118/84 | HR 104 | Temp 98.9°F | Resp 20 | Wt 258.0 lb

## 2023-08-04 DIAGNOSIS — Z9079 Acquired absence of other genital organ(s): Secondary | ICD-10-CM | POA: Insufficient documentation

## 2023-08-04 DIAGNOSIS — D3910 Neoplasm of uncertain behavior of unspecified ovary: Secondary | ICD-10-CM

## 2023-08-04 DIAGNOSIS — Z90722 Acquired absence of ovaries, bilateral: Secondary | ICD-10-CM | POA: Insufficient documentation

## 2023-08-04 DIAGNOSIS — Z90710 Acquired absence of both cervix and uterus: Secondary | ICD-10-CM | POA: Diagnosis not present

## 2023-08-04 DIAGNOSIS — Z8603 Personal history of neoplasm of uncertain behavior: Secondary | ICD-10-CM | POA: Diagnosis not present

## 2023-08-04 NOTE — Progress Notes (Signed)
 Gynecologic Oncology Return Clinic Visit  08/04/23  Reason for Visit: surveillance   Treatment History: Patient was initially seen in our clinic in 2015-2016 in the setting of chronic pelvic pain, narcotic dependency, and desire for surgical intervention.  She was found to have a thickened endometrial lining and given risk factors for endometrial hyperplasia and malignancy, endometrial sampling was recommended.  She underwent D&C on 02/13/2014 with final pathology revealing benign secretory endometrium.  Her pain was very difficult to manage postoperatively as described in Dr. Luberta note from 02/28/2014.   Her subsequent treatment history is as noted below from most recent 2 oldest.  Shortly after her visit last in our clinic, she underwent robotic hysterectomy. Other surgeries since have included left salpingo-oophorectomy for enlarged ovary and concern for torsion, as well as appendectomy.  At the time of both surgeries, extensive pelvic adhesions were noted in both surgeries required conversion from laparoscopy to open.   2016: Robotic hysterectomy for abnormal uterine bleeding and chronic pelvic pain.  Per patient report, extensive adhesions were noted at that time as well as likely endometriosis.  06/2017: Patient underwent exploratory laparoscopy converted to exploratory laparotomy, lysis of adhesion, cystoscopy, left salpingo-oophorectomy, and sigmoidoscopy in the setting of abdominal pain and concern for ovarian torsion.  Procedure was complicated by dense adhesive disease requiring conversion to open procedure with an estimated blood loss of 300 cc.  At the time of presentation, patient was noted to have an enlarged 6.6 cm left ovary with both imaging and exam findings concerning for torsion.  Findings at the time of surgery included omental and small bowel adhesions to the anterior abdominal wall.  Sigmoid colon was draped over the left ovary.  Right ovary noted to be normal in appearance and  adherent to the sidewall.  Pathology from surgery showed hemorrhagic cyst and intraparenchymal ovarian hemorrhage consistent with ischemic changes.  Benign ovary. 02/04/2018: Pelvic ultrasound shows right ovary measures 7.8 x 6.4 x 5.9 cm with a 6.4 x 5.8 x 4.7 cm cyst containing multiple thin internal septations and membranes with low-level internal echoes.  Cyst thought to most likely represent a hemorrhagic cyst or endometrioma. 06/15/2019: CT of the abdomen and pelvis shows borderline dilation of the tip of the appendix measuring 8 mm with very minimal inflammation.  Ill-defined 1.5 cm hypoattenuating lesion in the posterior segment 4 of the liver.  Chronic scarring along the anterior bladder and in the soft tissues of the low anterior pelvic wall. 06/16/2019: Pelvic ultrasound exam reveals a right ovary measuring 3.1 x 2.3 x 2.6 cm with a small anechoic follicle measuring 1.2 cm.  Previously seen hemorrhagic cyst noted to have involuted. 06/2019: Patient presented with several months of right lower quadrant pain with episodes of nausea.  Given concern for early appendicitis, patient was taken to the operating room for an appendectomy.  This was a laparoscopic procedure converted to open secondary to significant adhesive disease.  Findings at the time of surgery included a enlarged body and tip of the appendix.  Postoperative course was complicated by delayed healing of incision. 03/06/2020: Pelvic ultrasound reveals right ovary measures 3.3 x 3.2 x 2.6 cm with a 1.8 cm dominant follicle. 06/28/2020: Pelvic ultrasound exam at [facility] shows right ovary measuring 3.9 x 2.7 x 2.8 cm with a 1.4 cm simple appearing cyst. 10/30/2020: Pelvic MRI at [facility] shows right ovary measuring 3.5 x 1.8 x 2 cm.  Multiple follicles present.  Within the inferior aspect of the ovary, there is  an involuting ovarian cyst with typical peripheral enhancement measuring 2 cm.  Within the superior aspect, there is a T1/T2  hypointense lesion measuring 1.8 x 2 cm, demonstrating diffuse enhancement postcontrast.  Nodule appears to be within the ovary but closely abuts the cecum.  In retrospect review of prior CT scans, this nodule appears to have been present since 2020 and slightly hyperdense as compared to surrounding ovarian tissue. 12/17/2020: CT of the abdomen and pelvis shows enhancing area within the right ovary, possibly due to endometrioma, hemorrhagic cyst, or mass.  This is measured to be at least 2.3 x 1.5 cm. 12/17/2020: Pelvic ultrasound exam at [facility].  Right ovary measures 3.5 x 3.2 x 3.2 cm.  Mildly hypoechoic mass within the right ovary measures 1.8 x 1.6 x 1.7 cm and has associated Doppler flow.  Not significantly changed in size compared to October 2022 MRI. 02/17/2021: Pelvic ultrasound exam at [facility] shows right ovary measuring 3.5 x 2.7 x 3.1 cm.  Small lesion measuring 1.7 x 1.6 x 1.3 cm which does appear to have small amount of internal flow or possible artifact.  Impression is that there is a more solid-appearing lesion on the right ovary compared to 2 cyst like structures on prior examinations.  The cyst could represent hemorrhagic complex cyst or proteinaceous ovarian cyst.  Low degree suspicion for neoplasm considering waxing and waning appearance of the cyst on multiple prior exams.   CEA on 10/28/2020: 3.1   Patient was seen most recently by Dr. Cleatus on 1/27 in the setting of continued right lower quadrant pain. Most recent pelvic ultrasound exam in our system on 2/25 shows right ovary measures 3.3 x 3.2 x 2.6 cm with a 1.8 cm dominant follicle in the ovary.  No suspicious masses.   See was started on [place] in early 2023.    Pelvic ultrasound in 05/2021 revealed right ovary measures 3.4 x 3.1 x 3 cm.  Previous ovarian cyst no longer seen.  Within the right ovary is a suggestion of a 2.7 cm echogenic solid lesion with vascularity.   CT of the abdomen and pelvis on 06/24/2021 reveals  no acute abnormality seen.     MRI of the pelvis on 06/30/2021 reveals a 2.5 cm solid homogeneously enhancing right ovarian mass.  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   MRI of the pelvis on 12/28/2021 shows a solid right enhancing ovarian lesion within 1 mm of measurements from June 2023 but enlarged since October 2022.  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  No ascites or peritoneal nodularity, no adenopathy.  Postoperative changes along the lower abdominal wall with thinning or absence of the right rectus muscle and laxity of the abdominal wall is similar to prior imaging.   Pelvic ultrasound at [facility] on 03/01/2022 reveals an isoechoic area within the right ovary measuring up to 2.4 cm, thought to perhaps represent a hemorrhagic cyst.   CT of the abdomen and pelvis at [facility] on 03/21/2022 shows 3 cm solid enhancing lesion of the right adnexa, unchanged from prior study.   Pelvic ultrasound on 04/02/2022 at [facility] reveals the right ovary measures 4 x 2.8 x 3.7 cm.  No mass or fluid collection noted.   04/13/22: Diagnostic laparoscopy with conversion to exploratory laparotomy, lysis of adhesions and enterolysis for approximately 2.5 hours, right salpingo-oophorectomy, resection of two segments of small bowel with side to side reanastomosis,  resection of suspected small bowel diverticula, omental biopsy   Interval History: She has had multiple ED visit for abdominal pain, nausea/emesis/diarrhea since her last visit. CT imaging in April, May and July all without acute findings.  She is following with Dr. Frutoso (GI), last seen in May. Plan at that visit had been for referral to pain management as well a GI motility clinic.  Notes that overall doing okay but having more abdominal pain.  Most recent episode on 7/20 was significantly worse than prior episodes.  She describes the pain as  typically being on the right side, sometimes starting in the right lower quadrant, sometimes in the mid abdomen, often radiating up to the right upper quadrant and sometimes around to her back.  She has had associated nausea, emesis, and sometimes diarrhea.  Overall, she notes her baseline pain is about a 4, which is tolerable.  She notes mostly regular bowel function, occasional diarrhea and occasional constipation.  Over the last month, she has felt increased fatigue.  Has had some episodes of shortness of breath and dizziness, which she feels are happening more frequently.  Awaiting getting scheduled with GI motility specialist and pain.  Past Medical/Surgical History: Past Medical History:  Diagnosis Date   Acute pain of left knee 01/31/2023   Anemia 07/01/2015   Anxiety    Arthritis    knees   Cellulitis 01/24/2023   Complex ovarian cyst 03/03/2020   Degenerative disc disease, cervical 01/22/2015   Degenerative joint disease    back   Depression    Diabetes (HCC) 09/03/2014   Endometriosis of pelvis    Fibromyalgia muscle pain 10/07/2013   GAD (generalized anxiety disorder) 10/07/2013   History of ketoacidosis    01-15-2014   History of migraine headaches 05/27/2015   Hypertension associated with diabetes (HCC) 02/02/2015   Hypertriglyceridemia 12/24/2014   Influenza-like illness 02/14/2020   Injury of left knee 01/31/2023   Insomnia 12/14/2021   Intra-abdominal adhesions 08/23/2019   Lipoma 05/31/2022   Migraine 11/16/2022   Morbid obesity (HCC) 07/09/2014   Ovarian mass 04/13/2022   Pelvic adhesive disease 11/05/2020   Postoperative nausea 04/19/2022   Postoperative pain 04/19/2022   Primary osteoarthritis of both hips 01/22/2015   RUQ pain 12/16/2022   S/P appendectomy 06/28/2019   S/P unilateral salpingo-oophorectomy 07/12/2017   Formatting of this note might be different from the original.  Left     Sinus tachycardia    on metoprolol    Type 2 diabetes mellitus  (HCC)    Type 2 diabetes mellitus with obesity (HCC) 10/07/2013   Wears contact lenses     Past Surgical History:  Procedure Laterality Date   ABDOMINAL HYSTERECTOMY     APPENDECTOMY     BOWEL RESECTION  04/13/2022   Procedure: SMALL BOWEL RESECTION;  Surgeon: Viktoria Comer SAUNDERS, MD;  Location: [facility];  Service: Gynecology;;   BREAST REDUCTION SURGERY  age 48   CESAREAN SECTION  2002,  2005,  2008   DILATION AND CURETTAGE OF UTERUS N/A 02/13/2014   Procedure: DILATATION AND CURETTAGE;  Surgeon: Maurilio Ship, MD;  Location: [facility];  Service: Gynecology;  Laterality: N/A;   EXCISION OF ADNEXAL MASS Right 04/13/2022   Procedure: EXCISION OF ADNEXAL MASS;  Surgeon: Viktoria Comer SAUNDERS, MD;  Location: [facility];  Service: Gynecology;  Laterality: Right;   LAPAROSCOPY N/A 04/13/2022   Procedure: LAPAROSCOPY DIAGNOSTIC;  Surgeon: Viktoria Comer SAUNDERS, MD;  Location: [facility];  Service: Gynecology;  Laterality: N/A;  LEFT OOPHORECTOMY  2019   LYSIS OF ADHESION  04/13/2022   Procedure: LYSIS OF ADHESION;  Surgeon: Viktoria Comer SAUNDERS, MD;  Location: [facility];  Service: Gynecology;;   MANDIBLE SURGERY  age 13   Correct overbite   REDUCTION MAMMAPLASTY      Family History  Adopted: Yes  Problem Relation Age of Onset   Hyperlipidemia Mother    Hypertension Mother    Diabetes Mother    Breast cancer Maternal Aunt    Endometriosis Maternal Aunt    Colon cancer Neg Hx    Pancreatic cancer Neg Hx    Ovarian cancer Neg Hx    Prostate cancer Neg Hx     Social History   Socioeconomic History   Marital status: Married    Spouse name: Not on file   Number of children: Not on file   Years of education: Not on file   Highest education level: Some college, no degree  Occupational History   Occupation: works in Designer, jewellery  Tobacco Use   Smoking status: Never   Smokeless tobacco: Never  Vaping Use   Vaping status: Never Used  Substance and Sexual Activity   Alcohol use: No    Drug use: No   Sexual activity: Yes    Birth control/protection: Surgical  Other Topics Concern   Not on file  Social History Narrative   Not on file   Social Drivers of Health   Financial Resource Strain: Low Risk  (05/29/2023)   Received from [facility]   Overall Financial Resource Strain (CARDIA)    Difficulty of Paying Living Expenses: Not hard at all  Food Insecurity: No Food Insecurity (05/29/2023)   Received from [facility]   Hunger Vital Sign    Within the past 12 months, you worried that your food would run out before you got the money to buy more.: Never true    Within the past 12 months, the food you bought just didn't last and you didn't have money to get more.: Never true  Transportation Needs: No Transportation Needs (05/29/2023)   Received from [facility] - Transportation    Lack of Transportation (Medical): No    Lack of Transportation (Non-Medical): No  Physical Activity: Inactive (05/29/2023)   Received from [facility]   Exercise Vital Sign    On average, how many days per week do you engage in moderate to strenuous exercise (like a brisk walk)?: 0 days    On average, how many minutes do you engage in exercise at this level?: 40 min  Stress: No Stress Concern Present (05/29/2023)   Received from [facility] - Occupational Stress Questionnaire    Feeling of Stress : Not at all  Social Connections: Patient Declined (05/29/2023)   Received from [facility]   Social Network    How would you rate your social network (family, work, friends)?: Patient declined    Current Medications:  Current Outpatient Medications:    acetaminophen  (TYLENOL ) 500 MG tablet, Take 1,000 mg by mouth every 6 (six) hours as needed for moderate pain., Disp: , Rfl:    ALPRAZolam  (XANAX ) 0.5 MG tablet, TAKE 1 TABLET BY MOUTH AT BEDTIME AS NEEDED FOR ANXIETY., Disp: 30 tablet, Rfl: 3   butalbital -acetaminophen -caffeine   (FIORICET) 50-325-40 MG tablet, TAKE 1 TO 2 TABLETS BY MOUTH EVERY 6 HOURS AS NEEDED, Disp: 14 tablet, Rfl: 2   Continuous Glucose Sensor (FREESTYLE LIBRE 3 SENSOR) MISC,  USE ON THE SKIN EVERY 14 DAYS. USE TO CHECK GLUCOSE CONTINUOUSLY, Disp: 2 each, Rfl: 6   cyclobenzaprine  (FLEXERIL ) 10 MG tablet, Take 1 tablet (10 mg total) by mouth as needed for muscle spasms., Disp: 90 tablet, Rfl: 1   dapagliflozin  propanediol (FARXIGA ) 10 MG TABS tablet, Take 1 tablet (10 mg total) by mouth daily., Disp: 90 tablet, Rfl: 3   DULoxetine  (CYMBALTA ) 60 MG capsule, Take 60 mg by mouth daily., Disp: , Rfl:    gabapentin  (NEURONTIN ) 600 MG tablet, TAKE 2 TABLETS BY MOUTH 3 TIMES A DAY, Disp: 540 tablet, Rfl: 1   hydrOXYzine  (VISTARIL ) 25 MG capsule, TAKE 1-2 CAPSULES (25-50 MG TOTAL) BY MOUTH EVERY 8 (EIGHT) HOURS AS NEEDED., Disp: 540 capsule, Rfl: 1   insulin  glargine-yfgn (SEMGLEE , YFGN,) 100 UNIT/ML Pen, INJECT 40 UNITS INTO THE SKIN TWICE A DAY, Disp: 15 mL, Rfl: 2   Insulin  Pen Needle (ADVOCATE INSULIN  PEN NEEDLES) 33G X 4 MM MISC, 1 each by Does not apply route daily., Disp: 100 each, Rfl: 12   ketoconazole  (NIZORAL ) 2 % shampoo, APPLY 1 APPLICATION. TOPICALLY 2 (TWO) TIMES A WEEK., Disp: 120 mL, Rfl: 0   LORazepam (ATIVAN) 0.5 MG tablet, , Disp: , Rfl:    metoprolol  succinate (TOPROL -XL) 25 MG 24 hr tablet, TAKE 1 TABLET (25 MG TOTAL) BY MOUTH DAILY., Disp: 90 tablet, Rfl: 0   nortriptyline (PAMELOR) 10 MG capsule, Take 10 mg by mouth., Disp: , Rfl:    nystatin  cream (MYCOSTATIN ), APPLY TO AFFECTED AREA TWICE A DAY, Disp: 30 g, Rfl: 3   ondansetron  (ZOFRAN  ODT) 4 MG disintegrating tablet, Take 1-2 tablets (4-8 mg total) by mouth every 8 (eight) hours as needed for nausea or vomiting., Disp: 45 tablet, Rfl: 1   tirzepatide  (MOUNJARO ) 10 MG/0.5ML Pen, INJECT 10 MG INTO THE SKIN ONE TIME PER WEEK, Disp: 6 mL, Rfl: 1   traZODone (DESYREL) 50 MG tablet, Take 50 mg by mouth at bedtime as needed., Disp: , Rfl:    Review of Systems: + Decreased appetite secondary to Mounjaro , fatigue, shortness of breath, abdominal pain, constipation, diarrhea, pelvic pain, joint pain, back pain, dizziness, decreased concentration Denies fevers, chills, unexplained weight changes. Denies hearing loss, neck lumps or masses, mouth sores, ringing in ears or voice changes. Denies cough or wheezing.   Denies chest pain or palpitations. Denies leg swelling. Denies abdominal distention, blood in stools, nausea, vomiting, or early satiety. Denies pain with intercourse, dysuria, frequency, hematuria or incontinence. Denies hot flashes, vaginal bleeding or vaginal discharge.   Denies muscle pain/cramps. Denies itching, rash, or wounds. Denies headaches, numbness or seizures. Denies swollen lymph nodes or glands, denies easy bruising or bleeding. Denies anxiety, depression, confusion.  Physical Exam: BP 118/84 (BP Location: Left Arm, Patient Position: Sitting)   Pulse (!) 104   Temp 98.9 F (37.2 C) (Oral)   Resp 20   Wt 258 lb (117 kg)   LMP 01/14/2014   SpO2 96%   BMI 41.64 kg/m  General: Alert, oriented, no acute distress. HEENT: Normocephalic, atraumatic, sclera anicteric. Chest: Unlabored breathing on room air. Abdomen: Obese, soft, nontender.  Normoactive bowel sounds.  No masses or hepatosplenomegaly appreciated.  Well-healed incision.   Extremities: Grossly normal range of motion.  Warm, well perfused.  No edema bilaterally. Genitourinary: Deferred.  Preference was not to have this performed today.  Laboratory & Radiologic Studies:     Component Ref Range & Units (hover) 6 mo ago 1 yr ago  Inhibin B <7.0 <  7.0 CM          Component Ref Range & Units (hover) 6 mo ago (02/02/23) 1 yr ago (05/06/22) 1 yr ago (04/08/22)  Testosterone  <3 Low  17 208 High   Testosterone , Free 1.0 1.6 4.8 High   Sex Hormone Binding 30.3 35.6 CM 34.0 CM     CT [facility] 7/20:  1. No acute findings within the  abdomen or pelvis.  2. Fatty infiltration of the liver. Hepatomegaly.  3. Hysterectomy.  4. Postsurgical scarring along the RIGHT lower quadrant anterior abdominal wall.   CT angio chest 7/20: No evidence of pulmonary embolus or other acute process.   Assessment & Plan: Christine Grant is a 48 y.o. woman with Stage IA adult granulosa cell tumor who presents for follow-up. Surgery 04/2022.   Continues to have issues related to episodes of worsening abdominal pain.  Following with GI.  Discussed that on recent CT imaging, no evidence of recurrent disease. Preference was to skip pelvic exam. Plan for testosterone  and inhibin B today.   The patient was tachycardic today, about at her baseline.  Denies any associated symptoms.  Follow-up with her PCP next week.   Given early stage, low risk disease, we will continue with surveillance visits every 6 months initially.  I reviewed signs and symptoms that would be concerning for recurrence of her granulosa cell tumor and stressed the importance of calling if she develops any of these between visits.  22 minutes of total time was spent for this patient encounter, including preparation, face-to-face counseling with the patient and coordination of care, and documentation of the encounter.  Comer Dollar, MD  Division of Gynecologic Oncology  Department of Obstetrics and Gynecology  [facility]

## 2023-08-04 NOTE — Patient Instructions (Addendum)
It was good to see you today.    I will see you for follow-up in 6 months.  As always, if you develop any new and concerning symptoms before your next visit, please call to see me sooner.

## 2023-08-07 ENCOUNTER — Ambulatory Visit: Payer: Self-pay | Admitting: Gynecologic Oncology

## 2023-08-07 LAB — INHIBIN B: Inhibin B: <7 pg/mL

## 2023-08-09 ENCOUNTER — Ambulatory Visit: Admitting: Family Medicine

## 2023-08-10 ENCOUNTER — Other Ambulatory Visit: Payer: Self-pay | Admitting: Family Medicine

## 2023-08-10 DIAGNOSIS — E1159 Type 2 diabetes mellitus with other circulatory complications: Secondary | ICD-10-CM

## 2023-08-10 LAB — TESTOSTERONE, FREE, TOTAL, SHBG
Sex Hormone Binding: 38.4 nmol/L (ref 24.6–122.0)
Testosterone, Free: <0.2 pg/mL (ref 0.0–4.2)
Testosterone: <3 ng/dL — ABNORMAL LOW (ref 4–50)

## 2023-08-12 ENCOUNTER — Other Ambulatory Visit: Payer: Self-pay | Admitting: Family Medicine

## 2023-08-16 ENCOUNTER — Telehealth

## 2023-08-16 ENCOUNTER — Ambulatory Visit: Payer: Self-pay

## 2023-08-16 ENCOUNTER — Telehealth: Admitting: Physician Assistant

## 2023-08-16 DIAGNOSIS — F331 Major depressive disorder, recurrent, moderate: Secondary | ICD-10-CM

## 2023-08-16 DIAGNOSIS — F411 Generalized anxiety disorder: Secondary | ICD-10-CM

## 2023-08-16 NOTE — Progress Notes (Signed)
 Virtual Visit Consent   Christine Grant, you are scheduled for a virtual visit with a [facility] provider today. Just as with appointments in the office, your consent must be obtained to participate. Your consent will be active for this visit and any virtual visit you may have with one of our providers in the next 365 days. If you have a MyChart account, a copy of this consent can be sent to you electronically.  As this is a virtual visit, video technology does not allow for your provider to perform a traditional examination. This may limit your provider's ability to fully assess your condition. If your provider identifies any concerns that need to be evaluated in person or the need to arrange testing (such as labs, EKG, etc.), we will make arrangements to do so. Although advances in technology are sophisticated, we cannot ensure that it will always work on either your end or our end. If the connection with a video visit is poor, the visit may have to be switched to a telephone visit. With either a video or telephone visit, we are not always able to ensure that we have a secure connection.  By engaging in this virtual visit, you consent to the provision of healthcare and authorize for your insurance to be billed (if applicable) for the services provided during this visit. Depending on your insurance coverage, you may receive a charge related to this service.  I need to obtain your verbal consent now. Are you willing to proceed with your visit today? Amir Bunnell has provided verbal consent on 08/16/2023 for a virtual visit (video or telephone). Delon [name], PA-C  Date: 08/16/2023 4:24 PM   Virtual Visit via Video Note   I, Delon [name], connected with  Harolyn Merkin  ([id number], 07/24/1975) on 08/16/23 at  4:00 PM EDT by a video-enabled telemedicine application and verified that I am speaking with the correct person using two identifiers.  Location: Patient: Virtual Visit Location  Patient: Home Provider: Virtual Visit Location Provider: Home Office   I discussed the limitations of evaluation and management by telemedicine and the availability of in person appointments. The patient expressed understanding and agreed to proceed.    History of Present Illness: Christine Grant is a 48 y.o. who identifies as a female who was assigned female at birth, and is being seen today for anxiety, depression, fatigue.  Patient with worsening anhedonia, fatigue, body aches. Does have known depression and anxiety, along with other chronic illnesses including T2DM with insulin  use, pelvic adhesion disease, and DDD.   These symptoms have been progressively worsening and now affecting her day-to-day life. She is having trouble even getting out of bed. She has had labs recently without any glaring abnormality or source.   She is currently on Cymbalta  for anxiety and depression and feels this has been helping tremendously. She does not have acute panic attacks and reports she can normally find the trigger from an external stressor as the source for her panic attacks now. She does report having a Therapist, sports and a Paramedic.   She has been working with her providers and was to see her PCP tomorrow to discuss potential for a LOA and then intermittent FMLA for her mental health but the appointment was cancelled by the provider for unknown reasons.    Problems:  Patient Active Problem List   Diagnosis Date Noted   Abdominal pain 05/15/2023   Anxiety    Arthritis    Degenerative joint disease  Depression    History of ketoacidosis    Wears contact lenses    Injury of left knee 01/31/2023   Acute pain of left knee 01/31/2023   Cellulitis 01/24/2023   RUQ pain 12/16/2022   Migraine 11/16/2022   Lipoma 05/31/2022   Postoperative nausea 04/19/2022   Postoperative pain 04/19/2022   Ovarian mass 04/13/2022   Insomnia 12/14/2021   Pelvic adhesive disease 11/05/2020   Complex ovarian cyst  03/03/2020   Influenza-like illness 02/14/2020   Intra-abdominal adhesions 08/23/2019   S/P appendectomy 06/28/2019   S/P unilateral salpingo-oophorectomy 07/12/2017   Anemia 07/01/2015   Sinus tachycardia 05/27/2015   History of migraine headaches 05/27/2015   Hypertension associated with diabetes (HCC) 02/02/2015   Primary osteoarthritis of both hips 01/22/2015   Degenerative disc disease, cervical 01/22/2015   Hypertriglyceridemia 12/24/2014   Diabetes (HCC) 09/03/2014   Morbid obesity (HCC) 07/09/2014   Fibromyalgia muscle pain 10/07/2013   Type 2 diabetes mellitus with obesity (HCC) 10/07/2013   GAD (generalized anxiety disorder) 10/07/2013   Endometriosis of pelvis 10/07/2013    Allergies:  Allergies  Allergen Reactions   Toradol  [Ketorolac  Tromethamine ] Other (See Comments)    Chest tightness, flushed    Medications:  Current Outpatient Medications:    acetaminophen  (TYLENOL ) 500 MG tablet, Take 1,000 mg by mouth every 6 (six) hours as needed for moderate pain., Disp: , Rfl:    butalbital -acetaminophen -caffeine  (FIORICET) 50-325-40 MG tablet, TAKE 1 TO 2 TABLETS BY MOUTH EVERY 6 HOURS AS NEEDED, Disp: 14 tablet, Rfl: 2   Continuous Glucose Sensor (FREESTYLE LIBRE 3 SENSOR) MISC, USE ON THE SKIN EVERY 14 DAYS. USE TO CHECK GLUCOSE CONTINUOUSLY, Disp: 2 each, Rfl: 6   cyclobenzaprine  (FLEXERIL ) 10 MG tablet, Take 1 tablet (10 mg total) by mouth as needed for muscle spasms., Disp: 90 tablet, Rfl: 1   dapagliflozin  propanediol (FARXIGA ) 10 MG TABS tablet, Take 1 tablet (10 mg total) by mouth daily., Disp: 90 tablet, Rfl: 3   DULoxetine  (CYMBALTA ) 60 MG capsule, Take 60 mg by mouth daily., Disp: , Rfl:    gabapentin  (NEURONTIN ) 600 MG tablet, TAKE 2 TABLETS BY MOUTH 3 TIMES A DAY, Disp: 540 tablet, Rfl: 1   hydrOXYzine  (VISTARIL ) 25 MG capsule, TAKE 1-2 CAPSULES (25-50 MG TOTAL) BY MOUTH EVERY 8 (EIGHT) HOURS AS NEEDED., Disp: 540 capsule, Rfl: 1   insulin  glargine-yfgn (SEMGLEE ,  YFGN,) 100 UNIT/ML Pen, INJECT 40 UNITS INTO THE SKIN TWICE A DAY, Disp: 15 mL, Rfl: 2   Insulin  Pen Needle (ADVOCATE INSULIN  PEN NEEDLES) 33G X 4 MM MISC, 1 each by Does not apply route daily., Disp: 100 each, Rfl: 12   ketoconazole  (NIZORAL ) 2 % shampoo, APPLY 1 APPLICATION. TOPICALLY 2 (TWO) TIMES A WEEK., Disp: 120 mL, Rfl: 0   LORazepam (ATIVAN) 0.5 MG tablet, , Disp: , Rfl:    metoprolol  succinate (TOPROL -XL) 25 MG 24 hr tablet, TAKE 1 TABLET (25 MG TOTAL) BY MOUTH DAILY., Disp: 90 tablet, Rfl: 0   nortriptyline (PAMELOR) 10 MG capsule, Take 10 mg by mouth., Disp: , Rfl:    nystatin  cream (MYCOSTATIN ), APPLY TO AFFECTED AREA TWICE A DAY, Disp: 30 g, Rfl: 3   ondansetron  (ZOFRAN  ODT) 4 MG disintegrating tablet, Take 1-2 tablets (4-8 mg total) by mouth every 8 (eight) hours as needed for nausea or vomiting., Disp: 45 tablet, Rfl: 1   tirzepatide  (MOUNJARO ) 10 MG/0.5ML Pen, INJECT 10 MG INTO THE SKIN ONE TIME PER WEEK, Disp: 6 mL, Rfl: 1  Observations/Objective: Patient  is well-developed, well-nourished in no acute distress.  Resting comfortably at home.  Head is normocephalic, atraumatic.  No labored breathing.  Speech is clear and coherent with logical content.  Patient is alert and oriented at baseline.    Assessment and Plan: 1. Moderate episode of recurrent major depressive disorder (HCC) (Primary)  2. GAD (generalized anxiety disorder)  - Continue home medications anda coping mechanisms - Follow up with PCP or Psychiatrist for further labs and management - Work note provided  Follow Up Instructions: I discussed the assessment and treatment plan with the patient. The patient was provided an opportunity to ask questions and all were answered. The patient agreed with the plan and demonstrated an understanding of the instructions.  A copy of instructions were sent to the patient via MyChart unless otherwise noted below.    The patient was advised to call back or seek an in-person  evaluation if the symptoms worsen or if the condition fails to improve as anticipated.    Delon [name], PA-C

## 2023-08-16 NOTE — Telephone Encounter (Signed)
 PAS - Specialist sent message that pt's husband wanted to talk with someone regarding updating the appointment made today.  Pt with husband & he requested to move time from 6pm to the 4pm slot if still available: pt now scheduled at 4pm today via Virtual Urgent Care.

## 2023-08-16 NOTE — Patient Instructions (Signed)
 Christine Grant, thank you for joining Delon Christine Dickinson, PA-C for today's virtual visit.  While this provider is not your primary care provider (PCP), if your PCP is located in our provider database this encounter information will be shared with them immediately following your visit.   A [facility] MyChart account gives you access to today's visit and all your visits, tests, and labs performed at [facility]  click here if you don't have a [facility] MyChart account or go to [email or web address]  Consent: (Patient) Christine Grant provided verbal consent for this virtual visit at the beginning of the encounter.  Current Medications:  Current Outpatient Medications:    acetaminophen  (TYLENOL ) 500 MG tablet, Take 1,000 mg by mouth every 6 (six) hours as needed for moderate pain., Disp: , Rfl:    butalbital -acetaminophen -caffeine  (FIORICET) 50-325-40 MG tablet, TAKE 1 TO 2 TABLETS BY MOUTH EVERY 6 HOURS AS NEEDED, Disp: 14 tablet, Rfl: 2   Continuous Glucose Sensor (FREESTYLE LIBRE 3 SENSOR) MISC, USE ON THE SKIN EVERY 14 DAYS. USE TO CHECK GLUCOSE CONTINUOUSLY, Disp: 2 each, Rfl: 6   cyclobenzaprine  (FLEXERIL ) 10 MG tablet, Take 1 tablet (10 mg total) by mouth as needed for muscle spasms., Disp: 90 tablet, Rfl: 1   dapagliflozin  propanediol (FARXIGA ) 10 MG TABS tablet, Take 1 tablet (10 mg total) by mouth daily., Disp: 90 tablet, Rfl: 3   DULoxetine  (CYMBALTA ) 60 MG capsule, Take 60 mg by mouth daily., Disp: , Rfl:    gabapentin  (NEURONTIN ) 600 MG tablet, TAKE 2 TABLETS BY MOUTH 3 TIMES A DAY, Disp: 540 tablet, Rfl: 1   hydrOXYzine  (VISTARIL ) 25 MG capsule, TAKE 1-2 CAPSULES (25-50 MG TOTAL) BY MOUTH EVERY 8 (EIGHT) HOURS AS NEEDED., Disp: 540 capsule, Rfl: 1   insulin  glargine-yfgn (SEMGLEE , YFGN,) 100 UNIT/ML Pen, INJECT 40 UNITS INTO THE SKIN TWICE A DAY, Disp: 15 mL, Rfl: 2   Insulin  Pen Needle (ADVOCATE INSULIN  PEN NEEDLES) 33G X 4 MM MISC, 1 each by Does not apply route  daily., Disp: 100 each, Rfl: 12   ketoconazole  (NIZORAL ) 2 % shampoo, APPLY 1 APPLICATION. TOPICALLY 2 (TWO) TIMES A WEEK., Disp: 120 mL, Rfl: 0   LORazepam (ATIVAN) 0.5 MG tablet, , Disp: , Rfl:    metoprolol  succinate (TOPROL -XL) 25 MG 24 hr tablet, TAKE 1 TABLET (25 MG TOTAL) BY MOUTH DAILY., Disp: 90 tablet, Rfl: 0   nortriptyline (PAMELOR) 10 MG capsule, Take 10 mg by mouth., Disp: , Rfl:    nystatin  cream (MYCOSTATIN ), APPLY TO AFFECTED AREA TWICE A DAY, Disp: 30 g, Rfl: 3   ondansetron  (ZOFRAN  ODT) 4 MG disintegrating tablet, Take 1-2 tablets (4-8 mg total) by mouth every 8 (eight) hours as needed for nausea or vomiting., Disp: 45 tablet, Rfl: 1   tirzepatide  (MOUNJARO ) 10 MG/0.5ML Pen, INJECT 10 MG INTO THE SKIN ONE TIME PER WEEK, Disp: 6 mL, Rfl: 1   Medications ordered in this encounter:  No orders of the defined types were placed in this encounter.    *If you need refills on other medications prior to your next appointment, please contact your pharmacy*  Follow-Up: Call back or seek an in-person evaluation if the symptoms worsen or if the condition fails to improve as anticipated.  [facility] [phone]  Other Instructions Managing Anxiety, Adult After being diagnosed with anxiety, you may be relieved to know why you have felt or behaved a certain way. You may also feel overwhelmed about the treatment ahead and what it  will mean for your life. With care and support, you can manage your anxiety. How to manage lifestyle changes Understanding the difference between stress and anxiety Although stress can play a role in anxiety, it is not the same as anxiety. Stress is your body's reaction to life changes and events, both good and bad. Stress is often caused by something external, such as a deadline, test, or competition. It normally goes away after the event has ended and will last just a few hours. But, stress can be ongoing and can lead to more than just  stress. Anxiety is caused by something internal, such as imagining a terrible outcome or worrying that something will go wrong that will greatly upset you. Anxiety often does not go away even after the event is over, and it can become a long-term (chronic) worry. Lowering stress and anxiety Talk with your health care provider or a counselor to learn more about lowering anxiety and stress. They may suggest tension-reduction techniques, such as: Music. Spend time creating or listening to music that you enjoy and that inspires you. Mindfulness-based meditation. Practice being aware of your normal breaths while not trying to control your breathing. It can be done while sitting or walking. Centering prayer. Focus on a word, phrase, or sacred image that means something to you and brings you peace. Deep breathing. Expand your stomach and inhale slowly through your nose. Hold your breath for 3-5 seconds. Then breathe out slowly, letting your stomach muscles relax. Self-talk. Learn to notice and spot thought patterns that lead to anxiety reactions. Change those patterns to thoughts that feel peaceful. Muscle relaxation. Take time to tense muscles and then relax them. Choose a tension-reduction technique that fits your lifestyle and personality. These techniques take time and practice. Set aside 5-15 minutes a day to do them. Specialized therapists can offer counseling and training in these techniques. The training to help with anxiety may be covered by some insurance plans. Other things you can do to manage stress and anxiety include: Keeping a stress diary. This can help you learn what triggers your reaction and then learn ways to manage your response. Thinking about how you react to certain situations. You may not be able to control everything, but you can control your response. Making time for activities that help you relax and not feeling guilty about spending your time in this way. Doing visual imagery.  This involves imagining or creating mental pictures to help you relax. Practicing yoga. Through yoga poses, you can lower tension and relax.  Medicines Medicines for anxiety include: Antidepressant medicines. These are usually prescribed for long-term daily control. Anti-anxiety medicines. These may be added in severe cases, especially when panic attacks occur. When used together, medicines, psychotherapy, and tension-reduction techniques may be the most effective treatment. Relationships Relationships can play a big part in helping you recover. Spend more time connecting with trusted friends and family members. Think about going to couples counseling if you have a partner, taking family education classes, or going to family therapy. Therapy can help you and others better understand your anxiety. How to recognize changes in your anxiety Everyone responds differently to treatment for anxiety. Recovery from anxiety happens when symptoms lessen and stop interfering with your daily life at home or work. This may mean that you will start to: Have better concentration and focus. Worry will interfere less in your daily thinking. Sleep better. Be less irritable. Have more energy. Have improved memory. Try to recognize when your condition is  getting worse. Contact your provider if your symptoms interfere with home or work and you feel like your condition is not improving. Follow these instructions at home: Activity Exercise. Adults should: Exercise for at least 150 minutes each week. The exercise should increase your heart rate and make you sweat (moderate-intensity exercise). Do strengthening exercises at least twice a week. Get the right amount and quality of sleep. Most adults need 7-9 hours of sleep each night. Lifestyle  Eat a healthy diet that includes plenty of vegetables, fruits, whole grains, low-fat dairy products, and lean protein. Do not eat a lot of foods that are high in fats, added  sugars, or salt (sodium). Make choices that simplify your life. Do not use any products that contain nicotine or tobacco. These products include cigarettes, chewing tobacco, and vaping devices, such as e-cigarettes. If you need help quitting, ask your provider. Avoid caffeine , alcohol, and certain over-the-counter cold medicines. These may make you feel worse. Ask your pharmacist which medicines to avoid. General instructions Take over-the-counter and prescription medicines only as told by your provider. Keep all follow-up visits. This is to make sure you are managing your anxiety well or if you need more support. Where to find support You can get help and support from: Self-help groups. Online and [employer]. A trusted spiritual leader. Couples counseling. Family education classes. Family therapy. Where to find more information You may find that joining a support group helps you deal with your anxiety. The following sources can help you find counselors or support groups near you: Mental Health America: mentalhealthamerica.net Anxiety and Depression Association of [place] (ADAA): adaa.org The [employer] on Mental Illness (NAMI): nami.org Contact a health care provider if: You have a hard time staying focused or finishing tasks. You spend many hours a day feeling worried about everyday life. You are very tired because you cannot stop worrying. You start to have headaches or often feel tense. You have chronic nausea or diarrhea. Get help right away if: Your heart feels like it is racing. You have shortness of breath. You have thoughts of hurting yourself or others. Get help right away if you feel like you may hurt yourself or others, or have thoughts about taking your own life. Go to your nearest emergency room or: Call 911. Call the National Suicide Prevention Lifeline at (478)799-7479 or 988. This is open 24 hours a day. Text the Crisis Text Line at (475)385-5507. This  information is not intended to replace advice given to you by your health care provider. Make sure you discuss any questions you have with your health care provider. Document Revised: 10/05/2021 Document Reviewed: 04/19/2020 Elsevier Patient Education  2024 Elsevier Inc.   If you have been instructed to have an in-person evaluation today at a local Urgent Care facility, please use the link below. It will take you to a list of all of our available [facility], including address, phone number and hours of operation. Please do not delay care.  [facility]  If you or a family member do not have a primary care provider, use the link below to schedule a visit and establish care. When you choose a [facility] primary care physician or advanced practice provider, you gain a long-term partner in health. Find a Primary Care Provider  Learn more about [facility]'s in-office and virtual care options: [facility] - Get Care Now

## 2023-08-16 NOTE — Telephone Encounter (Signed)
 FYI Only or Action Required?: FYI only for provider.  Patient was last seen in primary care on 05/15/2023 by Alvia Bring, DO.  Called Nurse Triage reporting Anxiety.  Symptoms began x couple weeks.  Interventions attempted: Rest, hydration, or home remedies.  Symptoms are: gradually worsening.  Triage Disposition: See Physician Within 24 Hours  Patient/caregiver understands and will follow disposition?: Yes    Copied from CRM #[id number]. Topic: Clinical - Red Word Triage >> Aug 16, 2023 11:59 AM Christine Grant wrote: Red Word that prompted transfer to Nurse Triage: Patient's husband, Christine Grant, calling in to get an appt with Dr. Antoniette. States his wife is currently having an anxiety attack but he's not there with her. Also states they need to speak with Dr. Antoniette about FMLA papers. Reason for Disposition  Patient sounds very upset or troubled to the triager  Answer Assessment - Initial Assessment Questions 1. CONCERN: Did anything happen that prompted you to call today?      Financial stress, loss of son 2. ANXIETY SYMPTOMS: Can you describe how you (your loved one; patient) have been feeling? (e.g., tense, restless, panicky, anxious, keyed up, overwhelmed, sense of impending doom).      Anxious, overwhelmed 3. ONSET: How long have you been feeling this way? (e.g., hours, days, weeks)     Ongoing and worsening  x couple weeks ago 4. SEVERITY: How would you rate the level of anxiety? (e.g., 0 - 10; or mild, moderate, severe).     Moderate to severe 5. FUNCTIONAL IMPAIRMENT: How have these feelings affected your ability to do daily activities? Have you had more difficulty than usual doing your normal daily activities? (e.g., getting better, same, worse; self-care, school, work, interactions)     yes 6. HISTORY: Have you felt this way before? Have you ever been diagnosed with an anxiety problem in the past? (e.g., generalized anxiety disorder, panic attacks, PTSD). If  Yes, ask: How was this problem treated? (e.g., medicines, counseling, etc.)     Yes, medication, counseling 7. RISK OF HARM - SUICIDAL IDEATION: Do you ever have thoughts of hurting or killing yourself? If Yes, ask:  Do you have these feelings now? Do you have a plan on how you would do this?     no 8. TREATMENT:  What has been done so far to treat this anxiety? (e.g., medicines, relaxation strategies). What has helped?     Medication, therapy 9. THERAPIST: Do you have a counselor or therapist? If Yes, ask: What is their name?     yes 10. POTENTIAL TRIGGERS: Do you drink caffeinated beverages (e.g., coffee, colas, teas), and how much daily? Do you drink alcohol or use any drugs? Have you started any new medicines recently?       na 11. PATIENT SUPPORT: Who is with you now? Who do you live with? Do you have family or friends who you can talk to?        husband 71. OTHER SYMPTOMS: Do you have any other symptoms? (e.g., feeling depressed, trouble concentrating, trouble sleeping, trouble breathing, palpitations or fast heartbeat, chest pain, sweating, nausea, or diarrhea)       Abd pain due to adhesions - ongoing 13. PREGNANCY: Is there any chance you are pregnant? When was your last menstrual period?       [facility] appt scheduled for today.  Protocols used: Anxiety and Panic Attack-A-AH

## 2023-08-17 ENCOUNTER — Ambulatory Visit: Admitting: Family Medicine

## 2023-08-18 ENCOUNTER — Ambulatory Visit (INDEPENDENT_AMBULATORY_CARE_PROVIDER_SITE_OTHER): Admitting: Urgent Care

## 2023-08-18 VITALS — BP 143/85 | HR 115 | Resp 18 | Ht 66.0 in | Wt 252.0 lb

## 2023-08-18 DIAGNOSIS — R0602 Shortness of breath: Secondary | ICD-10-CM

## 2023-08-18 DIAGNOSIS — R630 Anorexia: Secondary | ICD-10-CM

## 2023-08-18 DIAGNOSIS — E1169 Type 2 diabetes mellitus with other specified complication: Secondary | ICD-10-CM

## 2023-08-18 DIAGNOSIS — R5383 Other fatigue: Secondary | ICD-10-CM | POA: Diagnosis not present

## 2023-08-18 DIAGNOSIS — E669 Obesity, unspecified: Secondary | ICD-10-CM

## 2023-08-18 DIAGNOSIS — Z7985 Long-term (current) use of injectable non-insulin antidiabetic drugs: Secondary | ICD-10-CM

## 2023-08-18 DIAGNOSIS — R42 Dizziness and giddiness: Secondary | ICD-10-CM

## 2023-08-18 LAB — POCT URINALYSIS DIP (CLINITEK)
Bilirubin, UA: NEGATIVE
Blood, UA: NEGATIVE
Glucose, UA: 250 mg/dL
Ketones, POC UA: NEGATIVE mg/dL
Leukocytes, UA: NEGATIVE
Nitrite, UA: NEGATIVE
POC PROTEIN,UA: NEGATIVE
Spec Grav, UA: 1.01 (ref 1.010–1.025)
Urobilinogen, UA: 0.2 U/dL
pH, UA: 6 (ref 5.0–8.0)

## 2023-08-18 LAB — GLUCOSE, POCT (MANUAL RESULT ENTRY): POC Glucose: 258 mg/dL — AB (ref 70–99)

## 2023-08-18 NOTE — Progress Notes (Signed)
 Established Patient Office Visit  Subjective:  Patient ID: Christine Grant, female    DOB: 05/17/1975  Age: 48 y.o. MRN: [id number]  Chief Complaint  Patient presents with   Medical Management of Chronic Issues    Worsening chronic issue flare up. Pt also mentioned she fell on Mon. And hit her tailbone pretty hard     HPI  Discussed the use of AI scribe software for clinical note transcription with the patient, who gave verbal consent to proceed.  History of Present Illness   Christine Grant is a 48 year old female with chronic sinus tachycardia who presents with worsening fatigue, shortness of breath, and dizziness.  Over the past six to eight weeks, she has experienced a marked increase in fatigue, making it difficult to get out of bed on some days. She also reports shortness of breath with minimal exertion, such as walking through the house, and increased dizziness and lightheadedness. Her symptoms are interfering with her ability to work from home due to decreased concentration.  Her dizziness primarily occurs when transitioning from lying down to sitting or from sitting to standing, but it can also occur while standing still. She maintains adequate hydration, consuming at least three 40-ounce containers of water  daily. No significant swelling in her ankles. She experiences dizziness upon standing.  She notes a significant decrease in appetite, which she attributes partially to her ongoing use of Mounjaro , although she finds the recent change in appetite unusual given her stable dosage. She has been on Mounjaro  at a dose of 10 mg for some time without prior issues. She also uses Farxiga  and occasionally administers insulin  when consuming sugary foods to prevent blood sugar spikes. (She has been prescribed Semglee , but uses this as an as needed insulin  rather than a basal insulin  as prescribed). She monitors her blood glucose with a continuous glucose monitor (CGM) and reports that while her  levels can spike if she does not watch her diet, they generally stay between 150 and 180 mg/dL. Recently, she has experienced episodes of hypoglycemia, particularly overnight, which is a new development for her.  She has a history of chronic sinus tachycardia, for which she takes metoprolol . She was diagnosed with this condition at around age 80. 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 She is under the care of a psychiatrist and therapist for mental health support, acknowledging the interplay between her chronic illnesses and mental health.       Patient Active Problem List   Diagnosis Date Noted   Abdominal pain 05/15/2023   Anxiety    Arthritis    Degenerative joint disease    Depression    History of ketoacidosis    Wears contact lenses    Injury of left knee 01/31/2023   Acute pain of left knee 01/31/2023   Cellulitis 01/24/2023   RUQ pain 12/16/2022   Migraine 11/16/2022   Lipoma 05/31/2022   Postoperative nausea 04/19/2022   Postoperative pain 04/19/2022   Ovarian mass 04/13/2022   Insomnia 12/14/2021   Pelvic adhesive disease 11/05/2020   Complex ovarian cyst 03/03/2020   Influenza-like illness 02/14/2020   Intra-abdominal adhesions 08/23/2019   S/P appendectomy 06/28/2019   S/P unilateral salpingo-oophorectomy 07/12/2017   Anemia 07/01/2015   Sinus tachycardia 05/27/2015   History of migraine headaches 05/27/2015   Hypertension associated with diabetes (HCC) 02/02/2015   Primary osteoarthritis of both hips 01/22/2015  Degenerative disc disease, cervical 01/22/2015   Hypertriglyceridemia 12/24/2014   Diabetes (HCC) 09/03/2014   Morbid obesity (HCC) 07/09/2014   Fibromyalgia muscle pain 10/07/2013   Type 2 diabetes mellitus with obesity (HCC) 10/07/2013   GAD (generalized anxiety disorder) 10/07/2013   Endometriosis of pelvis 10/07/2013   Past Medical History:   Diagnosis Date   Acute pain of left knee 01/31/2023   Allergy    Anemia 07/01/2015   Anxiety    Arthritis    knees   Cancer (HCC) 04/2022   Remission   Cellulitis 01/24/2023   Complex ovarian cyst 03/03/2020   Degenerative disc disease, cervical 01/22/2015   Degenerative joint disease    back   Depression    Diabetes (HCC) 09/03/2014   Endometriosis of pelvis    Fibromyalgia muscle pain 10/07/2013   GAD (generalized anxiety disorder) 10/07/2013   History of ketoacidosis    01-15-2014   History of migraine headaches 05/27/2015   Hypertension associated with diabetes (HCC) 02/02/2015   Hypertriglyceridemia 12/24/2014   Influenza-like illness 02/14/2020   Injury of left knee 01/31/2023   Insomnia 12/14/2021   Intra-abdominal adhesions 08/23/2019   Lipoma 05/31/2022   Migraine 11/16/2022   Morbid obesity (HCC) 07/09/2014   Ovarian mass 04/13/2022   Pelvic adhesive disease 11/05/2020   Postoperative nausea 04/19/2022   Postoperative pain 04/19/2022   Primary osteoarthritis of both hips 01/22/2015   RUQ pain 12/16/2022   S/P appendectomy 06/28/2019   S/P unilateral salpingo-oophorectomy 07/12/2017   Formatting of this note might be different from the original.  Left     Sinus tachycardia    on metoprolol    Type 2 diabetes mellitus (HCC)    Type 2 diabetes mellitus with obesity (HCC) 10/07/2013   Wears contact lenses    Past Surgical History:  Procedure Laterality Date   ABDOMINAL HYSTERECTOMY     APPENDECTOMY     BOWEL RESECTION  04/13/2022   Procedure: SMALL BOWEL RESECTION;  Surgeon: Viktoria Comer SAUNDERS, MD;  Location: [facility];  Service: Gynecology;;   BREAST REDUCTION SURGERY  age 29   CESAREAN SECTION  2002,  2005,  2008   DILATION AND CURETTAGE OF UTERUS N/A 02/13/2014   Procedure: DILATATION AND CURETTAGE;  Surgeon: Maurilio Ship, MD;  Location: [facility];  Service: Gynecology;  Laterality: N/A;   EXCISION OF ADNEXAL MASS Right 04/13/2022    Procedure: EXCISION OF ADNEXAL MASS;  Surgeon: Viktoria Comer SAUNDERS, MD;  Location: [facility];  Service: Gynecology;  Laterality: Right;   LAPAROSCOPY N/A 04/13/2022   Procedure: LAPAROSCOPY DIAGNOSTIC;  Surgeon: Viktoria Comer SAUNDERS, MD;  Location: [facility];  Service: Gynecology;  Laterality: N/A;   LEFT OOPHORECTOMY  2019   LYSIS OF ADHESION  04/13/2022   Procedure: LYSIS OF ADHESION;  Surgeon: Viktoria Comer SAUNDERS, MD;  Location: [facility];  Service: Gynecology;;   MANDIBLE SURGERY  age 30   Correct overbite   REDUCTION MAMMAPLASTY     Social History   Tobacco Use   Smoking status: Never   Smokeless tobacco: Never  Vaping Use   Vaping status: Never Used  Substance Use Topics   Alcohol use: No   Drug use: No      ROS: as noted in HPI  Objective:     BP (!) 143/85 Comment: sitting  Pulse 98   Resp 18   Ht 5' 6 (1.676 m)   Wt 252 lb (114.3 kg)   LMP 01/14/2014   SpO2 100%   BMI  40.67 kg/m  BP Readings from Last 3 Encounters:  08/18/23 (!) 143/85  08/04/23 118/84  05/15/23 136/82   Wt Readings from Last 3 Encounters:  08/18/23 252 lb (114.3 kg)  08/04/23 258 lb (117 kg)  05/15/23 269 lb (122 kg)      Physical Exam Vitals and nursing note reviewed. Exam conducted with a chaperone present.  Constitutional:      General: She is not in acute distress.    Appearance: Normal appearance. She is not ill-appearing, toxic-appearing or diaphoretic.  HENT:     Head: Normocephalic and atraumatic.     Right Ear: External ear normal.     Left Ear: External ear normal.     Nose: Nose normal.     Mouth/Throat:     Mouth: Mucous membranes are moist.     Pharynx: No oropharyngeal exudate or posterior oropharyngeal erythema.  Eyes:     General: No scleral icterus.       Right eye: No discharge.        Left eye: No discharge.     Extraocular Movements: Extraocular movements intact.     Pupils: Pupils are equal, round, and reactive to light.  Cardiovascular:     Rate and Rhythm:  Normal rate and regular rhythm.     Pulses: Normal pulses.     Heart sounds: Normal heart sounds. No murmur heard.    No friction rub. No gallop.  Pulmonary:     Effort: Pulmonary effort is normal. No respiratory distress.     Breath sounds: Normal breath sounds. No stridor. No wheezing or rhonchi.  Musculoskeletal:     Cervical back: Normal range of motion and neck supple. No rigidity or tenderness.     Right lower leg: No edema.     Left lower leg: No edema.  Lymphadenopathy:     Cervical: No cervical adenopathy.  Skin:    General: Skin is warm and dry.     Coloration: Skin is not jaundiced.     Findings: No bruising, erythema or rash.  Neurological:     General: No focal deficit present.     Mental Status: She is alert and oriented to person, place, and time.     Gait: Gait normal.  Psychiatric:        Mood and Affect: Mood normal.        Behavior: Behavior normal.      Orthostatic Vitals for the past 48 hrs (Last 6 readings):  Orthostatic BP Orthostatic Pulse BP Pulse  08/18/23 1125 (!) 154/96 111 -- --  08/18/23 1215 (!) 158/95 101 -- --  08/18/23 1216 -- -- (!) 143/85 98  08/18/23 1219 132/87 110 -- --     Results for orders placed or performed in visit on 08/18/23  POCT Glucose (CBG)  Result Value Ref Range   POC Glucose 258 (A) 70 - 99 mg/dl  POCT URINALYSIS DIP (CLINITEK)  Result Value Ref Range   Color, UA yellow yellow   Clarity, UA clear clear   Glucose, UA =250 negative mg/dL   Bilirubin, UA negative negative   Ketones, POC UA negative negative mg/dL   Spec Grav, UA 8.989 8.989 - 1.025   Blood, UA negative negative   pH, UA 6.0 5.0 - 8.0   POC PROTEIN,UA negative negative, trace   Urobilinogen, UA 0.2 0.2 or 1.0 E.U./dL   Nitrite, UA Negative Negative   Leukocytes, UA Negative Negative    Last CBC Lab Results  Component Value  Date   WBC 7.6 05/15/2023   HGB 15.9 05/15/2023   HCT 49.4 (H) 05/15/2023   MCV 82 05/15/2023   MCH 26.4 (L)  05/15/2023   RDW 13.1 05/15/2023   PLT 264 05/15/2023   Last metabolic panel Lab Results  Component Value Date   GLUCOSE 331 (H) 05/15/2023   NA 134 05/15/2023   K 5.1 05/15/2023   CL 96 05/15/2023   CO2 18 (L) 05/15/2023   BUN 14 05/15/2023   CREATININE 0.62 05/15/2023   EGFR 111 05/15/2023   CALCIUM 9.5 05/15/2023   PROT 6.6 05/15/2023   ALBUMIN 4.3 05/15/2023   LABGLOB 2.3 05/15/2023   BILITOT 0.3 05/15/2023   ALKPHOS 156 (H) 05/15/2023   AST 23 05/15/2023   ALT 37 (H) 05/15/2023   ANIONGAP 10 04/23/2022   Last lipids Lab Results  Component Value Date   CHOL 248 (A) 07/07/2016   HDL 35 07/07/2016   LDLCALC 132 07/07/2016   TRIG 654 (A) 07/07/2016   CHOLHDL 6.7 (H) 12/23/2014   Last hemoglobin A1c Lab Results  Component Value Date   HGBA1C 7.1 (A) 08/11/2022      The ASCVD Risk score (Arnett DK, et al., 2019) failed to calculate for the following reasons:   Cannot find a previous HDL lab   Cannot find a previous total cholesterol lab  Assessment & Plan:  Other fatigue -     CBC with Differential/Platelet -     TSH -     VITAMIN D  25 Hydroxy (Vit-D Deficiency, Fractures) -     Comprehensive metabolic panel with GFR -     A87 and Folate Panel  Shortness of breath -     DG Chest 2 View; Future -     Brain natriuretic peptide -     D-dimer, quantitative  Decreased appetite  Type 2 diabetes mellitus with obesity (HCC) -     POCT glucose (manual entry) -     POCT URINALYSIS DIP (CLINITEK) -     Hemoglobin A1c  Postural dizziness  Assessment and Plan    Fatigue, dizziness, and orthostatic hypotension Fatigue, dizziness, and orthostatic hypotension likely related to chronic sinus tachycardia. Orthostatic hypotension borderline positive given orthostatic vitals. - slow transitions, ensure adequate hydration. - Evaluate for potential cardiac issues.  Shortness of breath Shortness of breath with minimal exertion. No CP. Will obtain ddimer, BNP, CXR.  Consider ECHO vs CTA chest pending results of above - Evaluate for potential cardiac issues.  Chronic sinus tachycardia Chronic sinus tachycardia managed with metoprolol . Fatigue has gotten markedly worse in the past six to eight weeks.  Type 2 diabetes mellitus with hypoglycemia Type 2 diabetes managed with Mounjaro  and Farxiga . Recent hypoglycemia episodes, particularly overnight, with CGM alarms indicating dangerous lows. - check A1C and labs today. Stop using semglee  as PRN, but rather this should be daily as a basal insulin , with unit adjustments based upon fasting readings.  Decreased appetite Decreased appetite noted despite long-term use of Mounjaro . No recent changes in medication dosage or frequency.         Return in about 2 weeks (around 09/01/2023).   Benton LITTIE Gave, PA

## 2023-08-18 NOTE — Patient Instructions (Signed)
 We drew labs to further assess your symptoms today. Please go to [address] to obtain a chest xray.  Please schedule a follow up in 1-2 weeks to review results and treatment plan options.

## 2023-08-19 ENCOUNTER — Encounter: Payer: Self-pay | Admitting: Urgent Care

## 2023-08-19 LAB — CBC WITH DIFFERENTIAL/PLATELET
Basophils Absolute: 0.1 x10E3/uL (ref 0.0–0.2)
Basos: 1 %
EOS (ABSOLUTE): 0.2 x10E3/uL (ref 0.0–0.4)
Eos: 2 %
Hematocrit: 49.4 % — ABNORMAL HIGH (ref 34.0–46.6)
Hemoglobin: 15.4 g/dL (ref 11.1–15.9)
Immature Grans (Abs): 0 x10E3/uL (ref 0.0–0.1)
Immature Granulocytes: 0 %
Lymphocytes Absolute: 2 x10E3/uL (ref 0.7–3.1)
Lymphs: 29 %
MCH: 25.5 pg — ABNORMAL LOW (ref 26.6–33.0)
MCHC: 31.2 g/dL — ABNORMAL LOW (ref 31.5–35.7)
MCV: 82 fL (ref 79–97)
Monocytes Absolute: 0.4 x10E3/uL (ref 0.1–0.9)
Monocytes: 6 %
Neutrophils Absolute: 4.2 x10E3/uL (ref 1.4–7.0)
Neutrophils: 62 %
Platelets: 322 x10E3/uL (ref 150–450)
RBC: 6.04 x10E6/uL — ABNORMAL HIGH (ref 3.77–5.28)
RDW: 13.9 % (ref 11.7–15.4)
WBC: 6.9 x10E3/uL (ref 3.4–10.8)

## 2023-08-19 LAB — COMPREHENSIVE METABOLIC PANEL WITH GFR
ALT: 18 IU/L (ref 0–32)
AST: 8 IU/L (ref 0–40)
Albumin: 4.3 g/dL (ref 3.9–4.9)
Alkaline Phosphatase: 114 IU/L (ref 44–121)
BUN/Creatinine Ratio: 15 (ref 9–23)
BUN: 12 mg/dL (ref 6–24)
Bilirubin Total: <0.2 mg/dL (ref 0.0–1.2)
CO2: 19 mmol/L — ABNORMAL LOW (ref 20–29)
Calcium: 9.5 mg/dL (ref 8.7–10.2)
Chloride: 100 mmol/L (ref 96–106)
Creatinine, Ser: 0.82 mg/dL (ref 0.57–1.00)
Globulin, Total: 2.4 g/dL (ref 1.5–4.5)
Glucose: 271 mg/dL — ABNORMAL HIGH (ref 70–99)
Potassium: 4.2 mmol/L (ref 3.5–5.2)
Sodium: 139 mmol/L (ref 134–144)
Total Protein: 6.7 g/dL (ref 6.0–8.5)
eGFR: 89 mL/min/1.73 (ref >59)

## 2023-08-19 LAB — VITAMIN D 25 HYDROXY (VIT D DEFICIENCY, FRACTURES): Vit D, 25-Hydroxy: 5 ng/mL — ABNORMAL LOW (ref 30.0–100.0)

## 2023-08-19 LAB — D-DIMER, QUANTITATIVE: D-DIMER: 0.23 mg{FEU}/L (ref 0.00–0.49)

## 2023-08-19 LAB — BRAIN NATRIURETIC PEPTIDE: BNP: 41.2 pg/mL (ref 0.0–100.0)

## 2023-08-19 LAB — HEMOGLOBIN A1C
Est. average glucose Bld gHb Est-mCnc: 209 mg/dL
Hgb A1c MFr Bld: 8.9 % — ABNORMAL HIGH (ref 4.8–5.6)

## 2023-08-19 LAB — B12 AND FOLATE PANEL
Folate: 6.1 ng/mL (ref >3.0)
Vitamin B-12: 602 pg/mL (ref 232–1245)

## 2023-08-19 LAB — TSH: TSH: 2.49 u[IU]/mL (ref 0.450–4.500)

## 2023-08-20 ENCOUNTER — Ambulatory Visit: Payer: Self-pay | Admitting: Urgent Care

## 2023-08-20 DIAGNOSIS — E559 Vitamin D deficiency, unspecified: Secondary | ICD-10-CM

## 2023-08-20 DIAGNOSIS — E1165 Type 2 diabetes mellitus with hyperglycemia: Secondary | ICD-10-CM | POA: Insufficient documentation

## 2023-08-20 MED ORDER — VITAMIN D (ERGOCALCIFEROL) 1.25 MG (50000 UNIT) PO CAPS: 50000.0000 [IU] | ORAL_CAPSULE | ORAL | 0 refills | Status: AC

## 2023-09-08 ENCOUNTER — Encounter: Payer: Self-pay | Admitting: Family Medicine

## 2023-09-08 ENCOUNTER — Ambulatory Visit (INDEPENDENT_AMBULATORY_CARE_PROVIDER_SITE_OTHER): Admitting: Family Medicine

## 2023-09-08 VITALS — BP 120/79 | HR 108 | Ht 66.0 in | Wt 242.0 lb

## 2023-09-08 DIAGNOSIS — N83299 Other ovarian cyst, unspecified side: Secondary | ICD-10-CM

## 2023-09-08 DIAGNOSIS — E1169 Type 2 diabetes mellitus with other specified complication: Secondary | ICD-10-CM

## 2023-09-08 DIAGNOSIS — E669 Obesity, unspecified: Secondary | ICD-10-CM

## 2023-09-08 DIAGNOSIS — Z7985 Long-term (current) use of injectable non-insulin antidiabetic drugs: Secondary | ICD-10-CM

## 2023-09-08 MED ORDER — ONDANSETRON 4 MG PO TBDP: 4.0000 mg | ORAL_TABLET | Freq: Three times a day (TID) | ORAL | 1 refills | Status: AC | PRN

## 2023-09-08 NOTE — Progress Notes (Signed)
 Christine Grant - 48 y.o. female MRN [id number]  Date of birth: 07-20-1975  Subjective Chief Complaint  Patient presents with   Fatigue    HPI Christine Grant is a 48 y.o. female here today for follow-up visit.  Recent visit with one of my colleagues for fatigue.  Extensive labs checked which were relatively unremarkable other than hyperglycemia.  She had not been using insulin  as directed.  She is using this on a more regular basis now on glucose seems to be doing better.  She does report that her fatigue seems better at this point.  ROS:  A comprehensive ROS was completed and negative except as noted per HPI Neck pain Allergies  Allergen Reactions   Toradol  [Ketorolac  Tromethamine ] Other (See Comments)    Chest tightness, flushed     Past Medical History:  Diagnosis Date   Acute pain of left knee 01/31/2023   Allergy    Anemia 07/01/2015   Anxiety    Arthritis    knees   Cancer (HCC) 04/2022   Remission   Cellulitis 01/24/2023   Complex ovarian cyst 03/03/2020   Degenerative disc disease, cervical 01/22/2015   Degenerative joint disease    back   Depression    Diabetes (HCC) 09/03/2014   Endometriosis of pelvis    Fibromyalgia muscle pain 10/07/2013   GAD (generalized anxiety disorder) 10/07/2013   History of ketoacidosis    01-15-2014   History of migraine headaches 05/27/2015   Hypertension associated with diabetes (HCC) 02/02/2015   Hypertriglyceridemia 12/24/2014   Influenza-like illness 02/14/2020   Injury of left knee 01/31/2023   Insomnia 12/14/2021   Intra-abdominal adhesions 08/23/2019   Lipoma 05/31/2022   Migraine 11/16/2022   Morbid obesity (HCC) 07/09/2014   Ovarian mass 04/13/2022   Pelvic adhesive disease 11/05/2020   Postoperative nausea 04/19/2022   Postoperative pain 04/19/2022   Primary osteoarthritis of both hips 01/22/2015   RUQ pain 12/16/2022   S/P appendectomy 06/28/2019   S/P unilateral salpingo-oophorectomy 07/12/2017   Formatting of  this note might be different from the original.  Left     Sinus tachycardia    on metoprolol    Type 2 diabetes mellitus (HCC)    Type 2 diabetes mellitus with obesity (HCC) 10/07/2013   Wears contact lenses     Past Surgical History:  Procedure Laterality Date   ABDOMINAL HYSTERECTOMY     APPENDECTOMY     BOWEL RESECTION  04/13/2022   Procedure: SMALL BOWEL RESECTION;  Surgeon: Viktoria Comer SAUNDERS, MD;  Location: [facility];  Service: Gynecology;;   BREAST REDUCTION SURGERY  age 42   CESAREAN SECTION  2002,  2005,  2008   DILATION AND CURETTAGE OF UTERUS N/A 02/13/2014   Procedure: DILATATION AND CURETTAGE;  Surgeon: Maurilio Ship, MD;  Location: [facility];  Service: Gynecology;  Laterality: N/A;   EXCISION OF ADNEXAL MASS Right 04/13/2022   Procedure: EXCISION OF ADNEXAL MASS;  Surgeon: Viktoria Comer SAUNDERS, MD;  Location: [facility];  Service: Gynecology;  Laterality: Right;   LAPAROSCOPY N/A 04/13/2022   Procedure: LAPAROSCOPY DIAGNOSTIC;  Surgeon: Viktoria Comer SAUNDERS, MD;  Location: [facility];  Service: Gynecology;  Laterality: N/A;   LEFT OOPHORECTOMY  2019   LYSIS OF ADHESION  04/13/2022   Procedure: LYSIS OF ADHESION;  Surgeon: Viktoria Comer SAUNDERS, MD;  Location: [facility];  Service: Gynecology;;   MANDIBLE SURGERY  age 70   Correct overbite   REDUCTION MAMMAPLASTY      Social History  Socioeconomic History   Marital status: Married    Spouse name: Not on file   Number of children: Not on file   Years of education: Not on file   Highest education level: Bachelor's degree (e.g., BA, AB, BS)  Occupational History   Occupation: works in Designer, jewellery  Tobacco Use   Smoking status: Never   Smokeless tobacco: Never  Vaping Use   Vaping status: Never Used  Substance and Sexual Activity   Alcohol use: No   Drug use: No   Sexual activity: Yes    Birth control/protection: Surgical  Other Topics Concern   Not on file  Social History Narrative   Not on file   Social  Drivers of Health   Financial Resource Strain: Low Risk  (08/18/2023)   Overall Financial Resource Strain (CARDIA)    Difficulty of Paying Living Expenses: Not very hard  Food Insecurity: No Food Insecurity (08/18/2023)   Hunger Vital Sign    Worried About Running Out of Food in the Last Year: Never true    Ran Out of Food in the Last Year: Never true  Transportation Needs: No Transportation Needs (08/18/2023)   PRAPARE - Administrator, Civil Service (Medical): No    Lack of Transportation (Non-Medical): No  Physical Activity: Inactive (08/18/2023)   Exercise Vital Sign    Days of Exercise per Week: 0 days    Minutes of Exercise per Session: Not on file  Stress: Stress Concern Present (08/18/2023)   [employer] of Occupational Health - Occupational Stress Questionnaire    Feeling of Stress: To some extent  Social Connections: Socially Isolated (08/18/2023)   Social Connection and Isolation Panel    Frequency of Communication with Friends and Family: Twice a week    Frequency of Social Gatherings with Friends and Family: Never    Attends Religious Services: Never    Database administrator or Organizations: No    Attends Engineer, structural: Not on file    Marital Status: Married    Family History  Adopted: Yes  Problem Relation Age of Onset   Hyperlipidemia Mother    Hypertension Mother    Diabetes Mother    Breast cancer Maternal Aunt    Endometriosis Maternal Aunt    Colon cancer Neg Hx    Pancreatic cancer Neg Hx    Ovarian cancer Neg Hx    Prostate cancer Neg Hx     Health Maintenance  Topic Date Due   OPHTHALMOLOGY EXAM  Never done   COVID-19 Vaccine (3 - 2025-26 season) 09/11/2023   INFLUENZA VACCINE  04/09/2024 (Originally 08/11/2023)   Hepatitis B Vaccines 19-59 Average Risk (1 of 3 - 19+ 3-dose series) 09/07/2024 (Originally 08/02/1995)   Hepatitis C Screening  09/07/2024 (Originally 08/02/1994)   HIV Screening  09/07/2024 (Originally 08/02/1991)    HEMOGLOBIN A1C  02/18/2024   Diabetic kidney evaluation - Urine ACR  05/14/2024   Diabetic kidney evaluation - eGFR measurement  08/17/2024   FOOT EXAM  09/07/2024   Colonoscopy  10/30/2027   DTaP/Tdap/Td (3 - Td or Tdap) 01/27/2032   HPV VACCINES  Aged Out   Meningococcal B Vaccine  Aged Out     ----------------------------------------------------------------------------------------------------------------------------------------------------------------------------------------------------------------- Physical Exam BP 120/79 (BP Location: Left Arm, Patient Position: Sitting, Cuff Size: Large)   Pulse (!) 108   Ht 5' 6 (1.676 m)   Wt 242 lb (109.8 kg)   LMP 01/14/2014   SpO2 96%   BMI 39.06 kg/m  Physical Exam Constitutional:      Appearance: Normal appearance.  HENT:     Head: Normocephalic and atraumatic.  Cardiovascular:     Rate and Rhythm: Normal rate and regular rhythm.  Pulmonary:     Effort: Pulmonary effort is normal.     Breath sounds: Normal breath sounds.  Neurological:     General: No focal deficit present.     Mental Status: She is alert.  Psychiatric:        Mood and Affect: Mood normal.        Behavior: Behavior normal.     ------------------------------------------------------------------------------------------------------------------------------------------------------------------------------------------------------------------- Assessment and Plan  Type 2 diabetes mellitus with obesity (HCC) Hyperglycemia noted on recent labs.  She is taking insulin  on a more regular basis at this point.  Encouraged to continue this.  Fatigue has improved  at this point.   Meds ordered this encounter  Medications   ondansetron  (ZOFRAN  ODT) 4 MG disintegrating tablet    Sig: Take 1-2 tablets (4-8 mg total) by mouth every 8 (eight) hours as needed for nausea or vomiting.    Dispense:  45 tablet    Refill:  1    Return in about 4 months (around 01/08/2024)  for Type 2 Diabetes, Hypertension.

## 2023-09-11 NOTE — Assessment & Plan Note (Signed)
 Hyperglycemia noted on recent labs.  She is taking insulin  on a more regular basis at this point.  Encouraged to continue this.  Fatigue has improved  at this point.

## 2023-09-18 ENCOUNTER — Other Ambulatory Visit: Payer: Self-pay | Admitting: Family Medicine

## 2023-09-18 DIAGNOSIS — E669 Obesity, unspecified: Secondary | ICD-10-CM

## 2023-10-13 ENCOUNTER — Other Ambulatory Visit: Payer: Self-pay | Admitting: Family Medicine

## 2023-10-13 DIAGNOSIS — G8929 Other chronic pain: Secondary | ICD-10-CM

## 2023-10-20 ENCOUNTER — Encounter: Payer: Self-pay | Admitting: Family Medicine

## 2023-10-20 ENCOUNTER — Ambulatory Visit (INDEPENDENT_AMBULATORY_CARE_PROVIDER_SITE_OTHER): Admitting: Family Medicine

## 2023-10-20 VITALS — BP 109/73 | HR 112 | Ht 66.0 in | Wt 238.0 lb

## 2023-10-20 DIAGNOSIS — Z23 Encounter for immunization: Secondary | ICD-10-CM | POA: Diagnosis not present

## 2023-10-20 DIAGNOSIS — E781 Pure hyperglyceridemia: Secondary | ICD-10-CM | POA: Diagnosis not present

## 2023-10-20 DIAGNOSIS — F331 Major depressive disorder, recurrent, moderate: Secondary | ICD-10-CM | POA: Diagnosis not present

## 2023-10-20 DIAGNOSIS — R000 Tachycardia, unspecified: Secondary | ICD-10-CM | POA: Diagnosis not present

## 2023-10-20 MED ORDER — DULOXETINE HCL 60 MG PO CPEP: 60.0000 mg | ORAL_CAPSULE | Freq: Every day | ORAL | 1 refills | Status: AC

## 2023-10-20 MED ORDER — LORAZEPAM 0.5 MG PO TABS: ORAL_TABLET | ORAL | 0 refills | Status: DC

## 2023-10-20 NOTE — Assessment & Plan Note (Signed)
 She has been followed by behavioral health but is stable with cymbalta  and occasional rare use of lorazepam.  I let her know that I was ok with taking these over.  Refills provided.

## 2023-10-20 NOTE — Addendum Note (Signed)
 Addended by: Rubye Strohmeyer Z on: 10/20/2023 09:50 AM   Modules accepted: Orders

## 2023-10-20 NOTE — Progress Notes (Signed)
 Christine Grant - 48 y.o. female MRN [id number]  Date of birth: 1975/11/06  Subjective Chief Complaint  Patient presents with   Medication Refill    HPI Christine Grant is a 48 y.o. female here today for follow up visit.   She has been seeing behavioral health for management of cymbalta  and lorazepam.  She requests that I take this over.  She has been stable on current medications.  She rarely uses lorazepam.  She is no longer seeing pain clinic and is off all opioid containing medications.    She would like to have flu and COVID vaccine today.   ROS:  A comprehensive ROS was completed and negative except as noted per HPI    Allergies  Allergen Reactions   Toradol  [Ketorolac  Tromethamine ] Other (See Comments)    Chest tightness, flushed     Past Medical History:  Diagnosis Date   Acute pain of left knee 01/31/2023   Allergy    Anemia 07/01/2015   Anxiety    Arthritis    knees   Cancer (HCC) 04/2022   Remission   Cellulitis 01/24/2023   Complex ovarian cyst 03/03/2020   Degenerative disc disease, cervical 01/22/2015   Degenerative joint disease    back   Depression    Diabetes (HCC) 09/03/2014   Endometriosis of pelvis    Fibromyalgia muscle pain 10/07/2013   GAD (generalized anxiety disorder) 10/07/2013   History of ketoacidosis    01-15-2014   History of migraine headaches 05/27/2015   Hypertension associated with diabetes (HCC) 02/02/2015   Hypertriglyceridemia 12/24/2014   Influenza-like illness 02/14/2020   Injury of left knee 01/31/2023   Insomnia 12/14/2021   Intra-abdominal adhesions 08/23/2019   Lipoma 05/31/2022   Migraine 11/16/2022   Morbid obesity (HCC) 07/09/2014   Ovarian mass 04/13/2022   Pelvic adhesive disease 11/05/2020   Postoperative nausea 04/19/2022   Postoperative pain 04/19/2022   Primary osteoarthritis of both hips 01/22/2015   RUQ pain 12/16/2022   S/P appendectomy 06/28/2019   S/P unilateral salpingo-oophorectomy 07/12/2017    Formatting of this note might be different from the original.  Left     Sinus tachycardia    on metoprolol    Type 2 diabetes mellitus (HCC)    Type 2 diabetes mellitus with obesity 10/07/2013   Wears contact lenses     Past Surgical History:  Procedure Laterality Date   ABDOMINAL HYSTERECTOMY     APPENDECTOMY     BOWEL RESECTION  04/13/2022   Procedure: SMALL BOWEL RESECTION;  Surgeon: Viktoria Comer SAUNDERS, MD;  Location: [facility];  Service: Gynecology;;   BREAST REDUCTION SURGERY  age 29   CESAREAN SECTION  2002,  2005,  2008   DILATION AND CURETTAGE OF UTERUS N/A 02/13/2014   Procedure: DILATATION AND CURETTAGE;  Surgeon: Maurilio Ship, MD;  Location: [facility];  Service: Gynecology;  Laterality: N/A;   EXCISION OF ADNEXAL MASS Right 04/13/2022   Procedure: EXCISION OF ADNEXAL MASS;  Surgeon: Viktoria Comer SAUNDERS, MD;  Location: [facility];  Service: Gynecology;  Laterality: Right;   LAPAROSCOPY N/A 04/13/2022   Procedure: LAPAROSCOPY DIAGNOSTIC;  Surgeon: Viktoria Comer SAUNDERS, MD;  Location: [facility];  Service: Gynecology;  Laterality: N/A;   LEFT OOPHORECTOMY  2019   LYSIS OF ADHESION  04/13/2022   Procedure: LYSIS OF ADHESION;  Surgeon: Viktoria Comer SAUNDERS, MD;  Location: [facility];  Service: Gynecology;;   MANDIBLE SURGERY  age 52   Correct overbite   REDUCTION MAMMAPLASTY  Social History   Socioeconomic History   Marital status: Married    Spouse name: Not on file   Number of children: Not on file   Years of education: Not on file   Highest education level: Bachelor's degree (e.g., BA, AB, BS)  Occupational History   Occupation: works in Designer, jewellery  Tobacco Use   Smoking status: Never   Smokeless tobacco: Never  Vaping Use   Vaping status: Never Used  Substance and Sexual Activity   Alcohol use: No   Drug use: No   Sexual activity: Yes    Birth control/protection: Surgical  Other Topics Concern   Not on file  Social History Narrative   Not on file    Social Drivers of Health   Financial Resource Strain: Low Risk  (08/18/2023)   Overall Financial Resource Strain (CARDIA)    Difficulty of Paying Living Expenses: Not very hard  Food Insecurity: No Food Insecurity (08/18/2023)   Hunger Vital Sign    Worried About Running Out of Food in the Last Year: Never true    Ran Out of Food in the Last Year: Never true  Transportation Needs: No Transportation Needs (08/18/2023)   PRAPARE - Administrator, Civil Service (Medical): No    Lack of Transportation (Non-Medical): No  Physical Activity: Inactive (08/18/2023)   Exercise Vital Sign    Days of Exercise per Week: 0 days    Minutes of Exercise per Session: Not on file  Stress: Stress Concern Present (08/18/2023)   [employer] of Occupational Health - Occupational Stress Questionnaire    Feeling of Stress: To some extent  Social Connections: Socially Isolated (08/18/2023)   Social Connection and Isolation Panel    Frequency of Communication with Friends and Family: Twice a week    Frequency of Social Gatherings with Friends and Family: Never    Attends Religious Services: Never    Database administrator or Organizations: No    Attends Engineer, structural: Not on file    Marital Status: Married    Family History  Adopted: Yes  Problem Relation Age of Onset   Hyperlipidemia Mother    Hypertension Mother    Diabetes Mother    Breast cancer Maternal Aunt    Endometriosis Maternal Aunt    Colon cancer Neg Hx    Pancreatic cancer Neg Hx    Ovarian cancer Neg Hx    Prostate cancer Neg Hx     Health Maintenance  Topic Date Due   OPHTHALMOLOGY EXAM  Never done   COVID-19 Vaccine (3 - 2025-26 season) 09/11/2023   Influenza Vaccine  04/09/2024 (Originally 08/11/2023)   Hepatitis B Vaccines 19-59 Average Risk (1 of 3 - 19+ 3-dose series) 09/07/2024 (Originally 08/02/1995)   Hepatitis C Screening  09/07/2024 (Originally 08/02/1994)   HIV Screening  09/07/2024 (Originally  08/02/1991)   HEMOGLOBIN A1C  02/18/2024   Diabetic kidney evaluation - Urine ACR  05/14/2024   Diabetic kidney evaluation - eGFR measurement  08/17/2024   FOOT EXAM  09/07/2024   Mammogram  05/10/2025   Colonoscopy  10/30/2027   DTaP/Tdap/Td (3 - Td or Tdap) 01/27/2032   HPV VACCINES  Aged Out   Meningococcal B Vaccine  Aged Out     ----------------------------------------------------------------------------------------------------------------------------------------------------------------------------------------------------------------- Physical Exam BP 109/73 (BP Location: Left Arm, Patient Position: Sitting, Cuff Size: Large)   Pulse (!) 112   Ht 5' 6 (1.676 m)   Wt 238 lb (108 kg)   LMP 01/14/2014  SpO2 100%   BMI 38.41 kg/m   Physical Exam Constitutional:      Appearance: Normal appearance.  Cardiovascular:     Rate and Rhythm: Normal rate and regular rhythm.  Pulmonary:     Effort: Pulmonary effort is normal.     Breath sounds: Normal breath sounds.  Neurological:     General: No focal deficit present.     Mental Status: She is alert.     ------------------------------------------------------------------------------------------------------------------------------------------------------------------------------------------------------------------- Assessment and Plan  Depression She has been followed by behavioral health but is stable with cymbalta  and occasional rare use of lorazepam.  I let her know that I was ok with taking these over.  Refills provided.    Meds ordered this encounter  Medications   DULoxetine  (CYMBALTA ) 60 MG capsule    Sig: Take 1 capsule (60 mg total) by mouth daily.    Dispense:  90 capsule    Refill:  1   LORazepam (ATIVAN) 0.5 MG tablet    Sig: Take 1 po daily prn for anxiety    Dispense:  30 tablet    Refill:  0    No follow-ups on file.

## 2023-10-25 ENCOUNTER — Ambulatory Visit: Payer: Self-pay

## 2023-10-25 NOTE — Telephone Encounter (Signed)
 Attempted to call both listed numbers for patient and husband to check to see if this is in fact regards to [facility] as we also received notice that her husband richard was also in ER yesterday -  Phong rang on both numbers but did not connect .

## 2023-10-25 NOTE — Telephone Encounter (Signed)
 Spoke with  patient husband - who states pateint was sent home from ER on medications he thinks  but he himself has been admitted and they are waiting to do a CT for him .

## 2023-10-25 NOTE — Telephone Encounter (Signed)
 Daughter Christine Grant is getting patient ready to get into the car at this time to go to the Emergency Room She denied wanting an ambulance States her father had something similar and it ended up being sepsis Mother has been sick recently---advised Christine Grant that the patient needed to go to the Emergency immediately & Christine Grant wanted to take her POV instead of waiting for an ambulance  FYI Only or Action Required?: FYI only for provider.  Patient was last seen in primary care on 10/20/2023 by Alvia Bring, DO.  Called Nurse Triage reporting Altered Mental Status.  Symptoms began a couple of days ago.  Interventions attempted: Nothing.  Symptoms are: rapidly worsening.  Triage Disposition: Go to ED Now (or PCP Triage)  Patient/caregiver understands and will follow disposition?: Yes          Copied from CRM [id number]. Topic: Clinical - Red Word Triage >> Oct 25, 2023  8:52 AM Treva T wrote: [facility] that prompted transfer to Nurse Triage: Patient daughter, Christine Grant calling, states mom is acting strange, calling random people on the phone, twitching a lot, just not acting right.  Patient daughter states she is concerned, as mom has been sick recently, but present symptoms are concerning. Reason for Disposition  Patient sounds very sick or weak to the triager  Answer Assessment - Initial Assessment Questions Patient's daughter called and states that the patient  Not making sense per daughter Thought it was [birth year] earlier the year she was born Has been coughing since Sunday or Monday No fever at this time Is twitching a lot per daughter Christine Grant states that her father had a similar episode a while back and ended up having double pneumonia an sepsis--- Immediately, this RN advised her that the patient needed to go to the Emergency Room This RN offered to call 911 and stay on the phone with her if needed Patient states that her boyfriend is 3 minutes away and said he would drive them to the Emergency  Room if needed She states she is going to call him to help take her to the Emergency Room She is getting her mother up and ready to go to the ER at this Her mother is awake and alert enough to get into the car per patient. This RN advised her to call back if she needed anything else and call 911 if things worsen.  Protocols used: Neurologic Deficit-A-AH

## 2023-11-03 ENCOUNTER — Other Ambulatory Visit: Payer: Self-pay | Admitting: Family Medicine

## 2023-11-07 ENCOUNTER — Other Ambulatory Visit: Payer: Self-pay | Admitting: Family Medicine

## 2023-11-07 DIAGNOSIS — I152 Hypertension secondary to endocrine disorders: Secondary | ICD-10-CM

## 2023-12-11 ENCOUNTER — Encounter: Payer: Self-pay | Admitting: Family Medicine

## 2023-12-26 ENCOUNTER — Other Ambulatory Visit: Payer: Self-pay | Admitting: Family Medicine

## 2024-01-08 ENCOUNTER — Ambulatory Visit: Admitting: Family Medicine

## 2024-01-09 NOTE — Addendum Note (Signed)
 Encounter addended by: Ayaat Jansma L on: 01/09/2024 10:22 AM  Actions taken: Imaging Exam ended

## 2024-01-09 NOTE — Addendum Note (Signed)
 Encounter addended by: Dekker Verga L on: 01/09/2024 10:23 AM  Actions taken: Imaging Exam ended

## 2024-01-17 ENCOUNTER — Encounter: Payer: Self-pay | Admitting: Gynecologic Oncology

## 2024-01-19 ENCOUNTER — Telehealth: Payer: Self-pay | Admitting: *Deleted

## 2024-01-19 ENCOUNTER — Inpatient Hospital Stay: Payer: Self-pay | Admitting: Gynecologic Oncology

## 2024-01-19 DIAGNOSIS — D3910 Neoplasm of uncertain behavior of unspecified ovary: Secondary | ICD-10-CM

## 2024-01-19 NOTE — Telephone Encounter (Signed)
 Patient and her husband called to rescheduled her appt from today to 1/28

## 2024-01-19 NOTE — Progress Notes (Unsigned)
 Gynecologic Oncology Return Clinic Visit  01/19/2024  Reason for Visit: surveillance   Treatment History: Patient was initially seen in our clinic in 2015-2016 in the setting of chronic pelvic pain, narcotic dependency, and desire for surgical intervention.  She was found to have a thickened endometrial lining and given risk factors for endometrial hyperplasia and malignancy, endometrial sampling was recommended.  She underwent D&C on 02/13/2014 with final pathology revealing benign secretory endometrium.  Her pain was very difficult to manage postoperatively as described in Dr. Luberta note from 02/28/2014.   Her subsequent treatment history is as noted below from most recent 2 oldest.  Shortly after her visit last in our clinic, she underwent robotic hysterectomy. Other surgeries since have included left salpingo-oophorectomy for enlarged ovary and concern for torsion, as well as appendectomy.  At the time of both surgeries, extensive pelvic adhesions were noted in both surgeries required conversion from laparoscopy to open.   2016: Robotic hysterectomy for abnormal uterine bleeding and chronic pelvic pain.  Per patient report, extensive adhesions were noted at that time as well as likely endometriosis.  06/2017: Patient underwent exploratory laparoscopy converted to exploratory laparotomy, lysis of adhesion, cystoscopy, left salpingo-oophorectomy, and sigmoidoscopy in the setting of abdominal pain and concern for ovarian torsion.  Procedure was complicated by dense adhesive disease requiring conversion to open procedure with an estimated blood loss of 300 cc.  At the time of presentation, patient was noted to have an enlarged 6.6 cm left ovary with both imaging and exam findings concerning for torsion.  Findings at the time of surgery included omental and small bowel adhesions to the anterior abdominal wall.  Sigmoid colon was draped over the left ovary.  Right ovary noted to be normal in appearance and  adherent to the sidewall.  Pathology from surgery showed hemorrhagic cyst and intraparenchymal ovarian hemorrhage consistent with ischemic changes.  Benign ovary. 02/04/2018: Pelvic ultrasound shows right ovary measures 7.8 x 6.4 x 5.9 cm with a 6.4 x 5.8 x 4.7 cm cyst containing multiple thin internal septations and membranes with low-level internal echoes.  Cyst thought to most likely represent a hemorrhagic cyst or endometrioma. 06/15/2019: CT of the abdomen and pelvis shows borderline dilation of the tip of the appendix measuring 8 mm with very minimal inflammation.  Ill-defined 1.5 cm hypoattenuating lesion in the posterior segment 4 of the liver.  Chronic scarring along the anterior bladder and in the soft tissues of the low anterior pelvic wall. 06/16/2019: Pelvic ultrasound exam reveals a right ovary measuring 3.1 x 2.3 x 2.6 cm with a small anechoic follicle measuring 1.2 cm.  Previously seen hemorrhagic cyst noted to have involuted. 06/2019: Patient presented with several months of right lower quadrant pain with episodes of nausea.  Given concern for early appendicitis, patient was taken to the operating room for an appendectomy.  This was a laparoscopic procedure converted to open secondary to significant adhesive disease.  Findings at the time of surgery included a enlarged body and tip of the appendix.  Postoperative course was complicated by delayed healing of incision. 03/06/2020: Pelvic ultrasound reveals right ovary measures 3.3 x 3.2 x 2.6 cm with a 1.8 cm dominant follicle. 06/28/2020: Pelvic ultrasound exam at [facility] shows right ovary measuring 3.9 x 2.7 x 2.8 cm with a 1.4 cm simple appearing cyst. 10/30/2020: Pelvic MRI at [facility] shows right ovary measuring 3.5 x 1.8 x 2 cm.  Multiple follicles present.  Within the inferior aspect of the ovary, there is  an involuting ovarian cyst with typical peripheral enhancement measuring 2 cm.  Within the superior aspect, there is a T1/T2  hypointense lesion measuring 1.8 x 2 cm, demonstrating diffuse enhancement postcontrast.  Nodule appears to be within the ovary but closely abuts the cecum.  In retrospect review of prior CT scans, this nodule appears to have been present since 2020 and slightly hyperdense as compared to surrounding ovarian tissue. 12/17/2020: CT of the abdomen and pelvis shows enhancing area within the right ovary, possibly due to endometrioma, hemorrhagic cyst, or mass.  This is measured to be at least 2.3 x 1.5 cm. 12/17/2020: Pelvic ultrasound exam at [facility].  Right ovary measures 3.5 x 3.2 x 3.2 cm.  Mildly hypoechoic mass within the right ovary measures 1.8 x 1.6 x 1.7 cm and has associated Doppler flow.  Not significantly changed in size compared to October 2022 MRI. 02/17/2021: Pelvic ultrasound exam at [facility] shows right ovary measuring 3.5 x 2.7 x 3.1 cm.  Small lesion measuring 1.7 x 1.6 x 1.3 cm which does appear to have small amount of internal flow or possible artifact.  Impression is that there is a more solid-appearing lesion on the right ovary compared to 2 cyst like structures on prior examinations.  The cyst could represent hemorrhagic complex cyst or proteinaceous ovarian cyst.  Low degree suspicion for neoplasm considering waxing and waning appearance of the cyst on multiple prior exams.   CEA on 10/28/2020: 3.1   Patient was seen most recently by Dr. Cleatus on 1/27 in the setting of continued right lower quadrant pain. Most recent pelvic ultrasound exam in our system on 2/25 shows right ovary measures 3.3 x 3.2 x 2.6 cm with a 1.8 cm dominant follicle in the ovary.  No suspicious masses.   See was started on [place] in early 2023.    Pelvic ultrasound in 05/2021 revealed right ovary measures 3.4 x 3.1 x 3 cm.  Previous ovarian cyst no longer seen.  Within the right ovary is a suggestion of a 2.7 cm echogenic solid lesion with vascularity.   CT of the abdomen and pelvis on 06/24/2021 reveals  no acute abnormality seen.     MRI of the pelvis on 06/30/2021 reveals a 2.5 cm solid homogeneously enhancing right ovarian mass.  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   MRI of the pelvis on 12/28/2021 shows a solid right enhancing ovarian lesion within 1 mm of measurements from June 2023 but enlarged since October 2022.  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  No ascites or peritoneal nodularity, no adenopathy.  Postoperative changes along the lower abdominal wall with thinning or absence of the right rectus muscle and laxity of the abdominal wall is similar to prior imaging.   Pelvic ultrasound at [facility] on 03/01/2022 reveals an isoechoic area within the right ovary measuring up to 2.4 cm, thought to perhaps represent a hemorrhagic cyst.   CT of the abdomen and pelvis at [facility] on 03/21/2022 shows 3 cm solid enhancing lesion of the right adnexa, unchanged from prior study.   Pelvic ultrasound on 04/02/2022 at [facility] reveals the right ovary measures 4 x 2.8 x 3.7 cm.  No mass or fluid collection noted.   04/13/22: Diagnostic laparoscopy with conversion to exploratory laparotomy, lysis of adhesions and enterolysis for approximately 2.5 hours, right salpingo-oophorectomy, resection of two segments of small bowel with side to side reanastomosis,  resection of suspected small bowel diverticula, omental biopsy   Interval History: ***  Past Medical/Surgical History: Past Medical History:  Diagnosis Date   Acute pain of left knee 01/31/2023   Allergy    Anemia 07/01/2015   Anxiety    Arthritis    knees   Cancer (HCC) 04/2022   Remission   Cellulitis 01/24/2023   Complex ovarian cyst 03/03/2020   Degenerative disc disease, cervical 01/22/2015   Degenerative joint disease    back   Depression    Diabetes (HCC) 09/03/2014   Endometriosis of pelvis    Fibromyalgia muscle pain 10/07/2013    GAD (generalized anxiety disorder) 10/07/2013   History of ketoacidosis    01-15-2014   History of migraine headaches 05/27/2015   Hypertension associated with diabetes (HCC) 02/02/2015   Hypertriglyceridemia 12/24/2014   Influenza-like illness 02/14/2020   Injury of left knee 01/31/2023   Insomnia 12/14/2021   Intra-abdominal adhesions 08/23/2019   Lipoma 05/31/2022   Migraine 11/16/2022   Morbid obesity (HCC) 07/09/2014   Ovarian mass 04/13/2022   Pelvic adhesive disease 11/05/2020   Postoperative nausea 04/19/2022   Postoperative pain 04/19/2022   Primary osteoarthritis of both hips 01/22/2015   RUQ pain 12/16/2022   S/P appendectomy 06/28/2019   S/P unilateral salpingo-oophorectomy 07/12/2017   Formatting of this note might be different from the original.  Left     Sinus tachycardia    on metoprolol    Type 2 diabetes mellitus (HCC)    Type 2 diabetes mellitus with obesity 10/07/2013   Wears contact lenses     Past Surgical History:  Procedure Laterality Date   ABDOMINAL HYSTERECTOMY     APPENDECTOMY     BOWEL RESECTION  04/13/2022   Procedure: SMALL BOWEL RESECTION;  Surgeon: Viktoria Comer SAUNDERS, MD;  Location: [facility];  Service: Gynecology;;   BREAST REDUCTION SURGERY  age 55   CESAREAN SECTION  2002,  2005,  2008   DILATION AND CURETTAGE OF UTERUS N/A 02/13/2014   Procedure: DILATATION AND CURETTAGE;  Surgeon: Maurilio Ship, MD;  Location: [facility] [place];  Service: Gynecology;  Laterality: N/A;   EXCISION OF ADNEXAL MASS Right 04/13/2022   Procedure: EXCISION OF ADNEXAL MASS;  Surgeon: Viktoria Comer SAUNDERS, MD;  Location: [facility];  Service: Gynecology;  Laterality: Right;   LAPAROSCOPY N/A 04/13/2022   Procedure: LAPAROSCOPY DIAGNOSTIC;  Surgeon: Viktoria Comer SAUNDERS, MD;  Location: [facility];  Service: Gynecology;  Laterality: N/A;   LEFT OOPHORECTOMY  2019   LYSIS OF ADHESION  04/13/2022   Procedure: LYSIS OF ADHESION;  Surgeon: Viktoria Comer SAUNDERS, MD;  Location: [facility];  Service: Gynecology;;   MANDIBLE SURGERY  age 35   Correct overbite   REDUCTION MAMMAPLASTY      Family History  Adopted: Yes  Problem Relation Age of Onset   Hyperlipidemia Mother    Hypertension Mother    Diabetes Mother    Breast cancer Maternal Aunt    Endometriosis Maternal Aunt    Colon cancer Neg Hx    Pancreatic cancer Neg Hx    Ovarian cancer Neg Hx    Prostate cancer Neg Hx     Social History   Socioeconomic History   Marital status: Married    Spouse name: Not on file   Number of children: Not on file   Years of education: Not on file   Highest education level: Bachelor's degree (e.g., BA, AB, BS)  Occupational History   Occupation: works in administrator  billing  Tobacco Use   Smoking status: Never   Smokeless tobacco: Never  Vaping Use   Vaping status: Never Used  Substance and Sexual Activity   Alcohol use: No   Drug use: No   Sexual activity: Yes    Birth control/protection: Surgical  Other Topics Concern   Not on file  Social History Narrative   Not on file   Social Drivers of Health   Tobacco Use: Low Risk (10/20/2023)   Patient History    Smoking Tobacco Use: Never    Smokeless Tobacco Use: Never    Passive Exposure: Not on file  Financial Resource Strain: Low Risk (08/18/2023)   Overall Financial Resource Strain (CARDIA)    Difficulty of Paying Living Expenses: Not very hard  Food Insecurity: No Food Insecurity (08/18/2023)   Epic    Worried About Radiation Protection Practitioner of Food in the Last Year: Never true    Ran Out of Food in the Last Year: Never true  Transportation Needs: No Transportation Needs (08/18/2023)   Epic    Lack of Transportation (Medical): No    Lack of Transportation (Non-Medical): No  Physical Activity: Inactive (08/18/2023)   Exercise Vital Sign    Days of Exercise per Week: 0 days    Minutes of Exercise per Session: Not on file  Stress: Stress Concern Present (08/18/2023)   [employer] of Occupational Health - Occupational  Stress Questionnaire    Feeling of Stress: To some extent  Social Connections: Socially Isolated (08/18/2023)   Social Connection and Isolation Panel    Frequency of Communication with Friends and Family: Twice a week    Frequency of Social Gatherings with Friends and Family: Never    Attends Religious Services: Never    Database Administrator or Organizations: No    Attends Engineer, Structural: Not on file    Marital Status: Married  Depression (PHQ2-9): Medium Risk (10/20/2023)   Depression (PHQ2-9)    PHQ-2 Score: 7  Alcohol Screen: Low Risk (09/08/2023)   Alcohol Screen    Last Alcohol Screening Score (AUDIT): 0  Housing: Unknown (08/18/2023)   Epic    Unable to Pay for Housing in the Last Year: No    Number of Times Moved in the Last Year: Not on file    Homeless in the Last Year: No  Utilities: Not At Risk (05/29/2023)   Received from [facility]    Threatened with loss of utilities: No  Health Literacy: Adequate Health Literacy (09/08/2023)   B1300 Health Literacy    Frequency of need for help with medical instructions: Never    Current Medications: Current Medications[1]  Review of Systems: Denies appetite changes, fevers, chills, fatigue, unexplained weight changes. Denies hearing loss, neck lumps or masses, mouth sores, ringing in ears or voice changes. Denies cough or wheezing.  Denies shortness of breath. Denies chest pain or palpitations. Denies leg swelling. Denies abdominal distention, pain, blood in stools, constipation, diarrhea, nausea, vomiting, or early satiety. Denies pain with intercourse, dysuria, frequency, hematuria or incontinence. Denies hot flashes, pelvic pain, vaginal bleeding or vaginal discharge.   Denies joint pain, back pain or muscle pain/cramps. Denies itching, rash, or wounds. Denies dizziness, headaches, numbness or seizures. Denies swollen lymph nodes or glands, denies easy bruising or bleeding. Denies anxiety,  depression, confusion, or decreased concentration.  Physical Exam: LMP 01/14/2014  General: ***Alert, oriented, no acute distress. HEENT: ***Posterior oropharynx clear, sclera anicteric. Chest: ***Clear to auscultation bilaterally.  ***  Port site clean. Cardiovascular: ***Regular rate and rhythm, no murmurs. Abdomen: ***Obese, soft, nontender.  Normoactive bowel sounds.  No masses or hepatosplenomegaly appreciated.  ***Well-healed scar. Extremities: ***Grossly normal range of motion.  Warm, well perfused.  No edema bilaterally. Skin: ***No rashes or lesions noted. Lymphatics: ***No cervical, supraclavicular, or inguinal adenopathy. GU: Normal appearing external genitalia without erythema, excoriation, or lesions.  Speculum exam reveals ***.  Bimanual exam reveals ***.  ***Rectovaginal exam  confirms ___.  Laboratory & Radiologic Studies: ***  Assessment & Plan: Christine Grant is a 49 y.o. woman with Stage IA adult granulosa cell tumor who presents for follow-up. Surgery 04/2022. Last imaging 7.2025 -no evidence of disease.  Postsurgical scarring along the right lower quadrant anterior abdominal wall.  ***  Plan for testosterone  and inhibin B today.   ***The patient was tachycardic today, about at her baseline.  Denies any associated symptoms.   Given early stage, low risk disease, we will continue with surveillance visits every 6 months initially.  I reviewed signs and symptoms that would be concerning for recurrence of her granulosa cell tumor and stressed the importance of calling if she develops any of these between visits.  *** minutes of total time was spent for this patient encounter, including preparation, face-to-face counseling with the patient and coordination of care, and documentation of the encounter.  Comer Dollar, MD  Division of Gynecologic Oncology  Department of Obstetrics and Gynecology  [facility]      [1]  Current Outpatient  Medications:    acetaminophen  (TYLENOL ) 500 MG tablet, Take 1,000 mg by mouth every 6 (six) hours as needed for moderate pain., Disp: , Rfl:    butalbital -acetaminophen -caffeine  (FIORICET) 50-325-40 MG tablet, TAKE 1 TO 2 TABLETS BY MOUTH EVERY 6 HOURS AS NEEDED, Disp: 14 tablet, Rfl: 2   Continuous Glucose Sensor (FREESTYLE LIBRE 3 PLUS SENSOR) MISC, USE ON THE SKIN EVERY 14 DAYS. USE TO CHECK GLUCOSE CONTINUOUSLY, Disp: 2 each, Rfl: 6   cyclobenzaprine  (FLEXERIL ) 10 MG tablet, TAKE 1 TABLET (10 MG TOTAL) BY MOUTH AS NEEDED FOR MUSCLE SPASMS., Disp: 90 tablet, Rfl: 1   DULoxetine  (CYMBALTA ) 60 MG capsule, Take 1 capsule (60 mg total) by mouth daily., Disp: 90 capsule, Rfl: 1   FARXIGA  10 MG TABS tablet, TAKE 1 TABLET BY MOUTH EVERY DAY, Disp: 90 tablet, Rfl: 3   gabapentin  (NEURONTIN ) 600 MG tablet, TAKE 2 TABLETS BY MOUTH 3 TIMES A DAY, Disp: 540 tablet, Rfl: 1   hydrOXYzine  (VISTARIL ) 25 MG capsule, TAKE 1-2 CAPSULES (25-50 MG TOTAL) BY MOUTH EVERY 8 (EIGHT) HOURS AS NEEDED., Disp: 540 capsule, Rfl: 1   Insulin  Pen Needle (ADVOCATE INSULIN  PEN NEEDLES) 33G X 4 MM MISC, 1 each by Does not apply route daily., Disp: 100 each, Rfl: 12   ketoconazole  (NIZORAL ) 2 % shampoo, APPLY 1 APPLICATION. TOPICALLY 2 (TWO) TIMES A WEEK., Disp: 120 mL, Rfl: 0   metoprolol  succinate (TOPROL -XL) 25 MG 24 hr tablet, TAKE 1 TABLET (25 MG TOTAL) BY MOUTH DAILY., Disp: 90 tablet, Rfl: 1   nystatin  cream (MYCOSTATIN ), APPLY TO AFFECTED AREA TWICE A DAY, Disp: 30 g, Rfl: 3   ondansetron  (ZOFRAN  ODT) 4 MG disintegrating tablet, Take 1-2 tablets (4-8 mg total) by mouth every 8 (eight) hours as needed for nausea or vomiting., Disp: 45 tablet, Rfl: 1   Vitamin D , Ergocalciferol , (DRISDOL ) 1.25 MG (50000 UNIT) CAPS capsule, Take 1 capsule (50,000 Units total) by mouth every 7 (seven) days., Disp: 12  capsule, Rfl: 0

## 2024-01-30 ENCOUNTER — Inpatient Hospital Stay: Payer: Self-pay | Attending: Family Medicine

## 2024-02-07 ENCOUNTER — Encounter: Payer: Self-pay | Admitting: Obstetrics & Gynecology

## 2024-02-07 ENCOUNTER — Inpatient Hospital Stay: Payer: Self-pay | Admitting: Obstetrics & Gynecology

## 2024-02-07 DIAGNOSIS — D3910 Neoplasm of uncertain behavior of unspecified ovary: Secondary | ICD-10-CM | POA: Insufficient documentation

## 2024-02-07 NOTE — Assessment & Plan Note (Signed)
 Christine Grant is a 49 y.o. woman with Stage IA adult granulosa cell tumor who presents for follow-up. Surgery 04/2022. Plan for testosterone  and inhibin B today.      Given early stage, low risk disease, we will continue with surveillance visits every 6 months initially.  I reviewed signs and symptoms that would be concerning for recurrence of her granulosa cell tumor and stressed the importance of calling if she develops any of these between visits.

## 2024-02-07 NOTE — Progress Notes (Unsigned)
 Presented to the ED in 1/26 w/abdominal pain/SOB. CTAP performed in 12/25  Follow Up Note: Gyn-Onc  Christine Grant 49 y.o. female  CC:    HPI: The oncology history was reviewed. In 7/25 test was <3 and inhibin B was < 7.  Interval History: ***  Review of Systems  Review of Systems  Constitutional:  Negative for malaise/fatigue and weight loss.  Respiratory:  Negative for shortness of breath and wheezing.   Cardiovascular:  Negative for chest pain and leg swelling.  Gastrointestinal:  Negative for abdominal pain, blood in stool, constipation, nausea and vomiting.  Genitourinary:  Negative for dysuria, frequency, hematuria and urgency.  Musculoskeletal:  Negative for joint pain and myalgias.  Neurological:  Negative for weakness.  Psychiatric/Behavioral:  Negative for depression. The patient does not have insomnia.    Current medications, allergy, social history, past surgical history, past medical history, family history were all reviewed.    Vitals:  Last menstrual period 01/14/2014.  Physical Exam:  Physical Exam Exam conducted with a chaperone present.  Constitutional:      General: She is not in acute distress. Cardiovascular:     Rate and Rhythm: Normal rate and regular rhythm.  Pulmonary:     Effort: Pulmonary effort is normal.     Breath sounds: Normal breath sounds. No wheezing or rhonchi.  Abdominal:     Palpations: Abdomen is soft.     Tenderness: There is no abdominal tenderness. There is no right CVA tenderness or left CVA tenderness.     Hernia: No hernia is present.  Genitourinary:    General: Normal vulva.     Urethra: No urethral lesion.     Vagina: No lesions. No bleeding Musculoskeletal:     Cervical back: Neck supple.     Right lower leg: No edema.     Left lower leg: No edema.  Lymphadenopathy:     Upper Body:     Right upper body: No supraclavicular adenopathy.     Left upper body: No supraclavicular adenopathy.     Lower Body: No right  inguinal adenopathy. No left inguinal adenopathy.  Skin:    Findings: No rash.  Neurological:     Mental Status: She is oriented to person, place, and time.   Assessment/Plan: ***   [place]lam [place], MD
